# Patient Record
Sex: Male | Born: 1940
Health system: Southern US, Community
[De-identification: ages and names within clinical notes are randomized; demographics above are authoritative.]

## PROBLEM LIST (undated history)

## (undated) DIAGNOSIS — I4892 Unspecified atrial flutter: Secondary | ICD-10-CM

## (undated) DIAGNOSIS — I10 Essential (primary) hypertension: Secondary | ICD-10-CM

## (undated) SURGERY — BRONCHOSCOPY, WITH BIOPSY
Anesthesia: General | Laterality: Bilateral

---
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
## 2008-01-14 ENCOUNTER — Ambulatory Visit: Payer: Self-pay | Admitting: Gastroenterology

## 2008-03-17 ENCOUNTER — Ambulatory Visit: Payer: Self-pay | Admitting: Gastroenterology

## 2010-09-26 ENCOUNTER — Ambulatory Visit: Payer: Self-pay | Admitting: Gastroenterology

## 2010-09-28 LAB — PATHOLOGY REPORT

## 2012-11-26 ENCOUNTER — Ambulatory Visit: Payer: Self-pay | Admitting: Ophthalmology

## 2012-12-03 ENCOUNTER — Ambulatory Visit: Payer: Self-pay | Admitting: Ophthalmology

## 2014-09-11 NOTE — Op Note (Signed)
PATIENT NAME:  Corey Carr, Corey Carr MR#:  [id number] DATE OF BIRTH:  [birth year]  DATE OF PROCEDURE:  12/03/2012  PREOPERATIVE DIAGNOSIS: Visually significant cataract of the right eye.   POSTOPERATIVE DIAGNOSIS: Visually significant cataract of the right eye.   OPERATIVE PROCEDURE: Cataract extraction by phacoemulsification with implant of intraocular lens to right eye.   SURGEON: Birder Robson, MD.   ANESTHESIA:  1. Managed anesthesia care.  2. Topical tetracaine drops followed by 2% Xylocaine jelly applied in the preoperative holding area.   COMPLICATIONS: None.   TECHNIQUE:  Stop and chop.   DESCRIPTION OF PROCEDURE: The patient was examined and consented in the preoperative holding area where the aforementioned topical anesthesia was applied to the right eye and then brought back to the operating room where the right eye was prepped and draped in the usual sterile ophthalmic fashion and a lid speculum was placed. A paracentesis was created with the side port blade and the anterior chamber was filled with viscoelastic. A near clear corneal incision was performed with the steel keratome. A continuous curvilinear capsulorrhexis was performed with a cystotome followed by the capsulorrhexis forceps. Hydrodissection and hydrodelineation were carried out with BSS on a blunt cannula. The lens was removed in a stop and chop technique and the remaining cortical material was removed with the irrigation-aspiration handpiece. The capsular bag was inflated with viscoelastic and the Technis ZCBOO 21.5-diopter lens, serial number [id number], was placed in the capsular bag without complication. The remaining viscoelastic was removed from the eye with the irrigation-aspiration handpiece. The wounds were hydrated. The anterior chamber was flushed with Miostat and the eye was inflated to physiologic pressure. 0.1 mL of cefuroxime concentration 10 mg/mL was placed in the anterior chamber. The wounds were found to be  water tight. The eye was dressed with Vigamox. The patient was given protective glasses to wear throughout the day and a shield with which to sleep tonight. The patient was also given drops with which to begin a drop regimen today and will follow-up with me in one day.    ____________________________ Livingston Diones. Landyn Lorincz, MD wlp:cb D: 12/03/2012 16:54:44 ET T: 12/03/2012 20:47:15 ET JOB#: [id number]  cc: Retta Pitcher L. Fannie Alomar, MD, <Dictator> Livingston Diones Mayreli Alden MD ELECTRONICALLY SIGNED 12/06/2012 8:53

## 2016-02-23 DIAGNOSIS — N401 Enlarged prostate with lower urinary tract symptoms: Secondary | ICD-10-CM | POA: Insufficient documentation

## 2016-02-23 DIAGNOSIS — E785 Hyperlipidemia, unspecified: Secondary | ICD-10-CM | POA: Insufficient documentation

## 2016-02-23 DIAGNOSIS — K219 Gastro-esophageal reflux disease without esophagitis: Secondary | ICD-10-CM | POA: Insufficient documentation

## 2016-02-23 DIAGNOSIS — I10 Essential (primary) hypertension: Secondary | ICD-10-CM | POA: Diagnosis present

## 2016-08-25 ENCOUNTER — Encounter: Admission: RE | Payer: Self-pay | Source: Ambulatory Visit

## 2016-08-25 ENCOUNTER — Ambulatory Visit: Admission: RE | Admit: 2016-08-25 | Payer: Medicare Other | Source: Ambulatory Visit | Admitting: Gastroenterology

## 2016-08-25 SURGERY — COLONOSCOPY WITH PROPOFOL
Anesthesia: General

## 2016-11-14 ENCOUNTER — Encounter: Payer: Self-pay | Admitting: Urology

## 2016-11-14 ENCOUNTER — Ambulatory Visit (INDEPENDENT_AMBULATORY_CARE_PROVIDER_SITE_OTHER): Payer: Medicare Other | Admitting: Urology

## 2016-11-14 VITALS — BP 189/93 | HR 66 | Ht 68.0 in | Wt 153.3 lb

## 2016-11-14 DIAGNOSIS — R972 Elevated prostate specific antigen [PSA]: Secondary | ICD-10-CM

## 2016-11-14 NOTE — Progress Notes (Signed)
   11/14/2016 3:12 PM   Corey Carr May 10, 1941 [id number]  Referring provider: [name], MD [phone], [place] [address]  No chief complaint on file.   HPI:  Consultation for elevated PSA. Patient's October 2017 PSA was 10.24. He doesn't recall other PSA testing or a biopsy. No FH PCa. He has no h/o BPH. He typically voids with an adequate flow but slower stream. He has no frequency or urgency. He has trouble getting and maintaining erection.   He still works as a Mining engineer.      PMH: No past medical history on file.  Surgical History: No past surgical history on file.  Home Medications:  Allergies as of 11/14/2016   Not on File     Medication List    as of 11/14/2016  3:12 PM   You have not been prescribed any medications.     Allergies: Allergies not on file  Family History: No family history on file.  Social History:  has no tobacco, alcohol, and drug history on file.  ROS:                                        Physical Exam: There were no vitals taken for this visit.  Constitutional:  Alert and oriented, No acute distress. HEENT: [place] AT, moist mucus membranes.  Trachea midline, no masses. Cardiovascular: No clubbing, cyanosis, or edema. Respiratory: Normal respiratory effort, no increased work of breathing. GI: Abdomen is soft, nontender, nondistended, no abdominal masses GU: No CVA tenderness. Penis: uncircumcised, normal foreskin, no phimosis Testicle:descended bilaterally, no mass DRE: 40 grams, smooth, no hard area or nodule  Skin: No rashes, bruises or suspicious lesions. Lymph: No cervical or inguinal adenopathy. Neurologic: Grossly intact, no focal deficits, moving all 4 extremities. Psychiatric: Normal mood and affect.  Laboratory Data: No results found for: WBC, HGB, HCT, MCV, PLT  No results found for: CREATININE  No results found for: PSA  No results found for:  TESTOSTERONE  No results found for: HGBA1C  Urinalysis No results found for: COLORURINE, APPEARANCEUR, LABSPEC, PHURINE, GLUCOSEU, HGBUR, BILIRUBINUR, KETONESUR, PROTEINUR, UROBILINOGEN, NITRITE, LEUKOCYTESUR    Assessment & Plan:    PSA elevation -  Normal DRE today. I had a long discussion with the patient on the nature of elevated PSA - benign vs malignant causes. We discussed age specific levels and that PCa can be seen on a biopsy with very low PSA levels (<=2.5). We discussed the nature risks and benefits of continued surveillance, other lab tests, imaging as well as prostate biopsy. We discussed the management of prostate cancer might include active surveillance or treatment depending on biopsy findings. All questions answered. PSA was sent to determine of biopsy or f/u is in order.    There are no diagnoses linked to this encounter.  No Follow-up on file.  Festus Aloe, [facility] [address] [place], [place] [address] [phone]

## 2016-11-15 LAB — URINALYSIS, COMPLETE
BILIRUBIN UA: NEGATIVE
Glucose, UA: NEGATIVE
Ketones, UA: NEGATIVE
Leukocytes, UA: NEGATIVE
Nitrite, UA: NEGATIVE
Protein, UA: NEGATIVE
SPEC GRAV UA: 1.015 (ref 1.005–1.030)
Urobilinogen, Ur: 1 mg/dL (ref 0.2–1.0)
pH, UA: 7 (ref 5.0–7.5)

## 2016-11-15 LAB — PSA TOTAL (REFLEX TO FREE): Prostate Specific Ag, Serum: 10.1 ng/mL — ABNORMAL HIGH (ref 0.0–4.0)

## 2016-11-20 ENCOUNTER — Telehealth: Payer: Self-pay

## 2016-11-20 NOTE — Telephone Encounter (Signed)
Patient notified of results and was given biopsy instructions in detail (will also mail instructions to patient) patient states he is not on ASA for any medical reason just as preventative. Patient instructed to stop 7 days prior. No medical history in chart indicating medical clearance. Transferred to front to make biopsy apt

## 2016-11-20 NOTE — Telephone Encounter (Signed)
-----   Message from Festus Aloe, MD sent at 11/18/2016  5:40 PM EDT ----- Notify patient his PSA remains elevated at 10 -- give result. I recommend we proceed with a prostate biopsy as we discussed.  Please schedule him and over instructions.  Thank you.   ----- Message ----- From: Leia Alf, CMA Sent: 11/15/2016   8:08 AM To: Festus Aloe, MD    ----- Message ----- From: Lavone Neri Lab Results In Sent: 11/15/2016   5:40 AM To: Rowe Robert Clinical

## 2016-12-15 ENCOUNTER — Other Ambulatory Visit: Payer: Medicare Other | Admitting: Urology

## 2016-12-29 ENCOUNTER — Ambulatory Visit: Payer: Medicare Other

## 2017-01-19 ENCOUNTER — Other Ambulatory Visit: Payer: Medicare Other | Admitting: Urology

## 2017-02-02 ENCOUNTER — Ambulatory Visit: Payer: Medicare Other

## 2017-02-07 ENCOUNTER — Telehealth: Payer: Self-pay | Admitting: Urology

## 2017-02-07 NOTE — Telephone Encounter (Signed)
Pt canceled biopsy appt. For tomorrow 9/20.  He didn't have a ride.  Just F.Y.I.

## 2017-02-08 ENCOUNTER — Other Ambulatory Visit: Payer: Medicare Other

## 2017-02-21 ENCOUNTER — Ambulatory Visit: Payer: Medicare Other

## 2017-03-21 ENCOUNTER — Ambulatory Visit: Admission: RE | Admit: 2017-03-21 | Payer: Medicare Other | Source: Ambulatory Visit | Admitting: Ophthalmology

## 2017-03-21 ENCOUNTER — Encounter: Admission: RE | Payer: Self-pay | Source: Ambulatory Visit

## 2017-03-21 SURGERY — PHACOEMULSIFICATION, CATARACT, WITH IOL INSERTION
Anesthesia: Choice | Laterality: Left

## 2017-06-04 DIAGNOSIS — E785 Hyperlipidemia, unspecified: Secondary | ICD-10-CM | POA: Diagnosis not present

## 2017-06-11 DIAGNOSIS — K219 Gastro-esophageal reflux disease without esophagitis: Secondary | ICD-10-CM | POA: Diagnosis not present

## 2017-06-11 DIAGNOSIS — N401 Enlarged prostate with lower urinary tract symptoms: Secondary | ICD-10-CM | POA: Diagnosis not present

## 2017-06-11 DIAGNOSIS — I10 Essential (primary) hypertension: Secondary | ICD-10-CM | POA: Diagnosis not present

## 2017-06-11 DIAGNOSIS — R972 Elevated prostate specific antigen [PSA]: Secondary | ICD-10-CM | POA: Diagnosis not present

## 2017-06-11 DIAGNOSIS — Z23 Encounter for immunization: Secondary | ICD-10-CM | POA: Diagnosis not present

## 2017-06-11 DIAGNOSIS — E785 Hyperlipidemia, unspecified: Secondary | ICD-10-CM | POA: Diagnosis not present

## 2017-06-11 DIAGNOSIS — Z0000 Encounter for general adult medical examination without abnormal findings: Secondary | ICD-10-CM | POA: Diagnosis not present

## 2017-07-25 DIAGNOSIS — Z86010 Personal history of colonic polyps: Secondary | ICD-10-CM | POA: Diagnosis not present

## 2017-07-25 DIAGNOSIS — K219 Gastro-esophageal reflux disease without esophagitis: Secondary | ICD-10-CM | POA: Diagnosis not present

## 2017-07-25 DIAGNOSIS — K2270 Barrett's esophagus without dysplasia: Secondary | ICD-10-CM | POA: Diagnosis not present

## 2017-09-24 ENCOUNTER — Encounter: Admission: RE | Payer: Self-pay | Source: Ambulatory Visit

## 2017-09-24 ENCOUNTER — Ambulatory Visit: Admission: RE | Admit: 2017-09-24 | Payer: PPO | Source: Ambulatory Visit | Admitting: Gastroenterology

## 2017-09-24 SURGERY — COLONOSCOPY WITH PROPOFOL
Anesthesia: General

## 2019-01-07 ENCOUNTER — Other Ambulatory Visit: Payer: Self-pay | Admitting: Family Medicine

## 2019-01-07 DIAGNOSIS — R9389 Abnormal findings on diagnostic imaging of other specified body structures: Secondary | ICD-10-CM

## 2019-02-19 ENCOUNTER — Telehealth: Payer: Self-pay | Admitting: *Deleted

## 2019-02-19 NOTE — Telephone Encounter (Signed)
Left message for patient to notify them that it is time to schedule annual low dose lung cancer screening CT scan. Instructed patient to call back to verify information prior to the scan being scheduled.  

## 2019-02-20 ENCOUNTER — Telehealth: Payer: Self-pay | Admitting: *Deleted

## 2019-02-20 DIAGNOSIS — Z122 Encounter for screening for malignant neoplasm of respiratory organs: Secondary | ICD-10-CM

## 2019-02-20 DIAGNOSIS — Z87891 Personal history of nicotine dependence: Secondary | ICD-10-CM

## 2019-02-20 NOTE — Telephone Encounter (Signed)
Received referral for initial lung cancer screening scan. Contacted patient and obtained smoking history,(former, quit 2015, 30 pack year) as well as answering questions related to screening process. Patient denies signs of lung cancer such as weight loss or hemoptysis. Patient denies comorbidity that would prevent curative treatment if lung cancer were found. Patient is scheduled for shared decision making visit and CT scan on 03/06/19 at 130pm.

## 2019-03-05 ENCOUNTER — Encounter: Payer: Self-pay | Admitting: Nurse Practitioner

## 2019-03-06 ENCOUNTER — Ambulatory Visit
Admission: RE | Admit: 2019-03-06 | Discharge: 2019-03-06 | Disposition: A | Discharge status: Home / Self Care | Payer: Medicare Other | Source: Ambulatory Visit | Attending: Nurse Practitioner | Admitting: Nurse Practitioner

## 2019-03-06 ENCOUNTER — Other Ambulatory Visit: Payer: Self-pay

## 2019-03-06 ENCOUNTER — Inpatient Hospital Stay: Payer: Medicare Other | Attending: Nurse Practitioner | Admitting: Nurse Practitioner

## 2019-03-06 DIAGNOSIS — Z122 Encounter for screening for malignant neoplasm of respiratory organs: Secondary | ICD-10-CM | POA: Insufficient documentation

## 2019-03-06 DIAGNOSIS — R918 Other nonspecific abnormal finding of lung field: Secondary | ICD-10-CM | POA: Insufficient documentation

## 2019-03-06 DIAGNOSIS — I700 Atherosclerosis of aorta: Secondary | ICD-10-CM | POA: Insufficient documentation

## 2019-03-06 DIAGNOSIS — Z87891 Personal history of nicotine dependence: Secondary | ICD-10-CM

## 2019-03-06 DIAGNOSIS — K861 Other chronic pancreatitis: Secondary | ICD-10-CM | POA: Insufficient documentation

## 2019-03-06 DIAGNOSIS — J439 Emphysema, unspecified: Secondary | ICD-10-CM | POA: Insufficient documentation

## 2019-03-06 NOTE — Progress Notes (Signed)
Virtual Visit via Video Enabled Telemedicine Note   I connected with Corey Carr on 03/06/19 at 1:30 PM EST by video enabled telemedicine visit and verified that I am speaking with the correct person using two identifiers.   I discussed the limitations, risks, security and privacy concerns of performing an evaluation and management service by telemedicine and the availability of in-person appointments. I also discussed with the patient that there may be a patient responsible charge related to this service. The patient expressed understanding and agreed to proceed.   Other persons participating in the visit and their role in the encounter: Burgess Estelle, RN- checking in patient & navigation  Patient's location: [place]  Provider's location: Clinic  Chief Complaint: Low Dose CT Screening  Patient agreed to evaluation by telemedicine to discuss shared decision making for consideration of low dose CT lung cancer screening.    In accordance with CMS guidelines, patient has met eligibility criteria including age, absence of signs or symptoms of lung cancer.  Social History   Tobacco Use  . Smoking status: Former Smoker    Packs/day: 0.50    Years: 60.00    Pack years: 30.00    Types: Cigarettes    Quit date: 2015    Years since quitting: 5.7  . Smokeless tobacco: Never Used  Substance Use Topics  . Alcohol use: No     A shared decision-making session was conducted prior to the performance of CT scan. This includes one or more decision aids, includes benefits and harms of screening, follow-up diagnostic testing, over-diagnosis, false positive rate, and total radiation exposure.   Counseling on the importance of adherence to annual lung cancer LDCT screening, impact of co-morbidities, and ability or willingness to undergo diagnosis and treatment is imperative for compliance of the program.   Counseling on the importance of continued smoking cessation for former smokers; the  importance of smoking cessation for current smokers, and information about tobacco cessation interventions have been given to patient including [facility] and [address] [employer] [place] programs.   Written order for lung cancer screening with LDCT has been given to the patient and any and all questions have been answered to the best of my abilities.    Yearly follow up will be coordinated by Burgess Estelle, Thoracic Navigator.  I discussed the assessment and treatment plan with the patient. The patient was provided an opportunity to ask questions and all were answered. The patient agreed with the plan and demonstrated an understanding of the instructions.   The patient was advised to call back or seek an in-person evaluation if the symptoms worsen or if the condition fails to improve as anticipated.   I provided 15 minutes of face-to-face video visit time during this encounter, and > 50% was spent counseling as documented under my assessment & plan.   Beckey Rutter, DNP, AGNP-C [place] [facility] [phone] (work cell) [phone] (office)

## 2019-03-10 ENCOUNTER — Telehealth: Payer: Self-pay | Admitting: *Deleted

## 2019-03-10 NOTE — Telephone Encounter (Signed)
Notified patient of LDCT lung cancer screening program results with recommendation for pulmonary or thoracic surgery consultation. Also notified of incidental findings noted below and is encouraged to discuss further with PCP who will receive a copy of this note and/or the CT report. Patient verbalizes understanding.   Discussed results of scan with Corey Carr at PCP office as well as notifying [facility] pulmonologist that patient will likely be referred for evaluation. Patient is aware that he needs further evaluation by pulmonologist.   IMPRESSION: 1. Lung-RADS 4B, suspicious. Additional imaging evaluation or consultation with Pulmonology or Thoracic Surgery recommended. Multiple pulmonary abnormalities, the most significant of which is soft tissue density throughout the entirety of the left lower lobe, in the setting of moderate left hemidiaphragm elevation and diffuse pulmonary scarring and fibrosis. Radiographs including of 12/13/2018 describe similar findings, but without prior CTs for direct comparison, this remains indeterminate. Potential clinical strategies include comparison with prior CTs (if available). If not, bronchoscopy with attention to the left lower lobe bronchus and PET suggested. 2. Aortic atherosclerosis (ICD10-I70.0) and emphysema (ICD10-J43.9). 3. Chronic calcific pancreatitis.

## 2019-03-12 ENCOUNTER — Ambulatory Visit: Payer: Medicare Other | Admitting: Urology

## 2019-03-13 ENCOUNTER — Encounter: Payer: Self-pay | Admitting: Urology

## 2019-04-03 ENCOUNTER — Other Ambulatory Visit: Payer: Self-pay | Admitting: Pulmonary Disease

## 2019-04-03 DIAGNOSIS — J449 Chronic obstructive pulmonary disease, unspecified: Secondary | ICD-10-CM

## 2019-04-16 ENCOUNTER — Ambulatory Visit: Payer: Medicare Other | Admitting: Urology

## 2019-04-16 ENCOUNTER — Other Ambulatory Visit: Payer: Self-pay

## 2019-04-16 ENCOUNTER — Encounter: Payer: Self-pay | Admitting: Urology

## 2019-04-16 VITALS — BP 115/71 | HR 91 | Ht 68.0 in | Wt 117.0 lb

## 2019-04-16 DIAGNOSIS — R972 Elevated prostate specific antigen [PSA]: Secondary | ICD-10-CM

## 2019-04-16 DIAGNOSIS — N5201 Erectile dysfunction due to arterial insufficiency: Secondary | ICD-10-CM | POA: Diagnosis not present

## 2019-04-16 NOTE — Patient Instructions (Signed)
Prostate Cancer Screening  The prostate is a walnut-sized gland that is located below the bladder and in front of the rectum in males. The function of the prostate (prostate gland) is to add fluid to semen during ejaculation. Prostate cancer is the second most common type of cancer in men. A screening test for cancer is a test that is done before cancer symptoms start. Screening can help to identify cancer at an early stage, when the cancer can be treated more easily. The recommended prostate cancer screening test is a blood test called the prostate-specific antigen (PSA) test. PSA is a protein that is made in the prostate. As you age, your prostate naturally produces more PSA. Abnormally high PSA levels may be caused by:  Prostate cancer.  An enlarged prostate that is not caused by cancer (benign prostatic hyperplasia, BPH). This condition is very common in older men.  A prostate gland infection (prostatitis).  Medicines to assist with hair growth, such as finasteride. Depending on the PSA results, you may need more tests, such as:  A physical exam to check the size of your prostate gland.  Blood and imaging tests.  A procedure to remove tissue samples from your prostate gland for testing (biopsy). Who should have screening? Screening recommendations vary based on age.  If you are younger than age 22, screening is not recommended.  If you are age 28-54 and you have no risk factors, screening is not recommended.  If you are younger than age 43, ask your health care provider if you need screening if you have one of these risk factors: ? Being of African-American descent. ? Having a family history of prostate cancer.  If you are age 62-69, talk with your health care provider about your need for screening and how often screening should be done.  If you are older than age 58, screening is not recommended. This is because the risks that screening can cause are greater than the benefits  that it may provide (risks outweigh the benefits). If you are at high risk for prostate cancer, your health care provider may recommend that you have screenings more often or start screening at a younger age. You may be at high risk if you:  Are older than age 3.  Are African-American.  Have a father, brother, or uncle who has been diagnosed with prostate cancer. The risk may be higher if your family member's cancer occurred at an early age. What are the benefits of screening? There is a small chance that screening may lower your risk of dying from prostate cancer. The chance is small because prostate cancer is typically a slow-growing cancer, and most men with prostate cancer die from a different cause. What are the risks of screening? The main risk of prostate cancer screening is diagnosing and treating prostate cancer that would never have caused any symptoms or problems (overdiagnosis and overtreatment). PSA screening cannot tell you if your PSA is high due to cancer or a different cause. A prostate biopsy is the only procedure to diagnose prostate cancer. Even the results of a biopsy may not tell you if your cancer needs to be treated. Slow-growing prostate cancer may not need any treatment other than monitoring, so diagnosing and treating it may cause unnecessary stress or other side effects. A prostate biopsy may also cause:  Infection or fever.  A false negative. This is a result that shows that you do not have prostate cancer when you actually do have prostate cancer. Questions  to ask your health care provider  When should I start prostate cancer screening?  What is my risk for prostate cancer?  How often do I need screening?  What type of screening tests do I need?  How do I get my test results?  What do my results mean?  Do I need treatment? Contact a health care provider if:  You have difficulty urinating.  You have pain when you urinate or ejaculate.  You have  blood in your urine or semen.  You have pain in your back or in the area of your prostate.  You have trouble getting or maintaining an erection (erectile dysfunction, ED). Summary  Prostate cancer is a common type of cancer in men. The prostate (prostate gland) is located below the bladder and in front of the rectum. This gland adds fluid to semen during ejaculation.  Prostate cancer screening may identify cancer at an early stage, when the cancer can be treated more easily.  The prostate-specific antigen (PSA) test is the recommended screening test for prostate cancer.  Discuss the risks and benefits of prostate cancer screening with your health care provider. If you are age 105 or older, screening is likely to lead to more risks than benefits (risks outweigh the benefits). This information is not intended to replace advice given to you by your health care provider. Make sure you discuss any questions you have with your health care provider. Document Released: 02/16/2017 Document Revised: 04/20/2017 Document Reviewed: 02/16/2017 Elsevier Patient Education  2020 [employer].

## 2019-04-16 NOTE — Progress Notes (Signed)
04/16/2019 9:51 AM   Corey Carr 25-Feb-1941 [id number]  Referring provider: [name], MD [phone] [place],  [place] [address]  Chief Complaint  Patient presents with  . Elevated PSA    HPI:  F/u elevated PSA. Patient's October 2017 PSA was 10.24. He doesn't recall other PSA testing or a biopsy. No FH PCa. He has no h/o BPH. I repeated PSA and it was 10.1 in June 2018. Biopsy scheduled but patient canceled. PSA now 15.47 on 02/10/2019 with his PCP. Currently being evaluated for a Lungs rads 4 suspicious LLL consolidation. PET scan pending. 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    He typically voids with an adequate flow but slower stream. He has no frequency or urgency. He has trouble getting and maintaining erection. He has not tried Viagra recently. 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    PMH: No past medical history on file.  Surgical History: Past Surgical History:  Procedure Laterality Date  . KNEE SURGERY Right 1957    Home Medications:  Allergies as of 04/16/2019   Not on File     Medication List       Accurate as of April 16, 2019  9:51 AM. If you have any questions, ask your nurse or doctor.        Allergy Relief 10 MG tablet Generic drug: loratadine Take 20 mg by mouth daily.   aspirin 325 MG tablet Take 1 tablet by mouth daily.   DOCOSAHEXAENOIC ACID PO Take 1 capsule by mouth daily.   esomeprazole 20 MG capsule Commonly known as: NEXIUM Take 1 capsule by mouth daily.   Gas Relief Ultra Strength 180 MG Caps Generic drug: Simethicone Take by mouth.   TYLENOL 8 HOUR PO Take 1 tablet by mouth daily.   vitamin B-12 1000 MCG tablet Commonly known as: CYANOCOBALAMIN Take 1 tablet by mouth daily.       Allergies: Not on File  Family History: No family history on file.  Social History:  reports that he quit smoking about 5 years ago. His smoking use included  cigarettes. He has a 30.00 pack-year smoking history. He has never used smokeless tobacco. He reports that he does not drink alcohol or use drugs.  ROS: UROLOGY Frequent Urination?: No Hard to postpone urination?: No Burning/pain with urination?: No Get up at night to urinate?: Yes Leakage of urine?: No Urine stream starts and stops?: No Trouble starting stream?: No Do you have to strain to urinate?: No Blood in urine?: No Urinary tract infection?: No Sexually transmitted disease?: No Injury to kidneys or bladder?: No Painful intercourse?: No Weak stream?: Yes Erection problems?: No Penile pain?: No  Gastrointestinal Nausea?: No Vomiting?: No Indigestion/heartburn?: No Diarrhea?: No Constipation?: Yes  Constitutional Fever: No Night sweats?: No Weight loss?: No Fatigue?: No  Skin Skin rash/lesions?: No Itching?: No  Eyes Blurred vision?: No Double vision?: No  Ears/Nose/Throat Sore throat?: No Sinus problems?: No  Hematologic/Lymphatic Swollen glands?: No Easy bruising?: No  Cardiovascular Leg swelling?: No Chest pain?: No  Respiratory Cough?: No Shortness of breath?: No  Endocrine Excessive thirst?: No  Musculoskeletal Back pain?: No Joint pain?: No  Neurological Headaches?: No Dizziness?: No  Psychologic Depression?: No Anxiety?: No  Physical Exam: BP 115/71   Pulse 91   Ht 5\' 8"  (1.727 m)   Wt 53.1 kg   BMI 17.79 kg/m   Constitutional:  Alert and oriented, No  acute distress. HEENT: [place] AT, moist mucus membranes.  Trachea midline, no masses. Cardiovascular: No clubbing, cyanosis, or edema. Respiratory: Normal respiratory effort, slight increased work of breathing with short, quick breaths  GI: Abdomen is soft, nontender, nondistended, no abdominal masses Skin: No rashes, bruises or suspicious lesions. Neurologic: Grossly intact, no focal deficits, moving all 4 extremities. Psychiatric: Normal mood and affect. DRE: prostate about  45 gram, smooth, no induration. Landmarks preserved.   Laboratory Data: No results found for: WBC, HGB, HCT, MCV, PLT  No results found for: CREATININE  No results found for: PSA  No results found for: TESTOSTERONE  No results found for: HGBA1C  Urinalysis    Component Value Date/Time   APPEARANCEUR Clear 11/14/2016 1538   GLUCOSEU Negative 11/14/2016 1538   BILIRUBINUR Negative 11/14/2016 1538   PROTEINUR Negative 11/14/2016 1538   NITRITE Negative 11/14/2016 1538   LEUKOCYTESUR Negative 11/14/2016 1538    No results found for: LABMICR, [place], RBCUA, LABEPIT, MUCUS, BACTERIA  Pertinent Imaging:  No results found for this or any previous visit. No results found for this or any previous visit. No results found for this or any previous visit. No results found for this or any previous visit. No results found for this or any previous visit. No results found for this or any previous visit. No results found for this or any previous visit. No results found for this or any previous visit.  Assessment & Plan:    PSA - discussed possibility of PCa but with normal DRE and PSA 15, still likely to be localized. Given the work-up for the lung, I am going to hold off on biopsy and have him return with another PSA to review PET and PSA.   ED - discussed pde5i and he will consider.   No follow-ups on file.  Festus Aloe, MD  [facility] [address] [place], [place] [address] [phone]

## 2019-04-21 ENCOUNTER — Encounter: Admission: RE | Admit: 2019-04-21 | Payer: Medicare Other | Source: Ambulatory Visit

## 2019-04-28 ENCOUNTER — Encounter: Admission: RE | Admit: 2019-04-28 | Payer: Medicare Other | Source: Ambulatory Visit

## 2019-04-30 ENCOUNTER — Other Ambulatory Visit: Payer: Self-pay | Admitting: Pulmonary Disease

## 2019-04-30 ENCOUNTER — Other Ambulatory Visit
Admission: RE | Admit: 2019-04-30 | Discharge: 2019-04-30 | Disposition: A | Discharge status: Home / Self Care | Payer: Medicare Other | Source: Ambulatory Visit | Attending: Pulmonary Disease | Admitting: Pulmonary Disease

## 2019-04-30 ENCOUNTER — Other Ambulatory Visit: Payer: Self-pay

## 2019-04-30 DIAGNOSIS — Z01812 Encounter for preprocedural laboratory examination: Secondary | ICD-10-CM | POA: Diagnosis present

## 2019-04-30 DIAGNOSIS — Z20828 Contact with and (suspected) exposure to other viral communicable diseases: Secondary | ICD-10-CM | POA: Insufficient documentation

## 2019-04-30 DIAGNOSIS — J90 Pleural effusion, not elsewhere classified: Secondary | ICD-10-CM

## 2019-05-01 LAB — SARS CORONAVIRUS 2 (TAT 6-24 HRS): SARS Coronavirus 2: NEGATIVE

## 2019-05-02 ENCOUNTER — Other Ambulatory Visit: Payer: Self-pay

## 2019-05-02 ENCOUNTER — Ambulatory Visit
Admission: RE | Admit: 2019-05-02 | Discharge: 2019-05-02 | Disposition: A | Discharge status: Home / Self Care | Payer: Medicare Other | Source: Ambulatory Visit | Attending: Pulmonary Disease | Admitting: Pulmonary Disease

## 2019-05-02 ENCOUNTER — Other Ambulatory Visit: Payer: Self-pay | Admitting: Pulmonary Disease

## 2019-05-02 DIAGNOSIS — J90 Pleural effusion, not elsewhere classified: Secondary | ICD-10-CM

## 2019-05-08 ENCOUNTER — Inpatient Hospital Stay
Admission: EM | Admit: 2019-05-08 | Discharge: 2019-05-12 | DRG: 177 | Disposition: A | Discharge status: Home Health | Payer: Medicare Other | Attending: Internal Medicine | Admitting: Internal Medicine

## 2019-05-08 ENCOUNTER — Encounter: Payer: Self-pay | Admitting: Emergency Medicine

## 2019-05-08 ENCOUNTER — Emergency Department: Payer: Medicare Other

## 2019-05-08 ENCOUNTER — Other Ambulatory Visit: Payer: Self-pay

## 2019-05-08 DIAGNOSIS — J440 Chronic obstructive pulmonary disease with acute lower respiratory infection: Secondary | ICD-10-CM | POA: Diagnosis present

## 2019-05-08 DIAGNOSIS — K219 Gastro-esophageal reflux disease without esophagitis: Secondary | ICD-10-CM | POA: Diagnosis present

## 2019-05-08 DIAGNOSIS — R918 Other nonspecific abnormal finding of lung field: Secondary | ICD-10-CM | POA: Diagnosis not present

## 2019-05-08 DIAGNOSIS — E538 Deficiency of other specified B group vitamins: Secondary | ICD-10-CM | POA: Diagnosis present

## 2019-05-08 DIAGNOSIS — R634 Abnormal weight loss: Secondary | ICD-10-CM | POA: Diagnosis present

## 2019-05-08 DIAGNOSIS — R627 Adult failure to thrive: Secondary | ICD-10-CM | POA: Diagnosis present

## 2019-05-08 DIAGNOSIS — Z7951 Long term (current) use of inhaled steroids: Secondary | ICD-10-CM | POA: Diagnosis not present

## 2019-05-08 DIAGNOSIS — E875 Hyperkalemia: Secondary | ICD-10-CM | POA: Diagnosis present

## 2019-05-08 DIAGNOSIS — J45909 Unspecified asthma, uncomplicated: Secondary | ICD-10-CM | POA: Diagnosis present

## 2019-05-08 DIAGNOSIS — I10 Essential (primary) hypertension: Secondary | ICD-10-CM | POA: Diagnosis present

## 2019-05-08 DIAGNOSIS — J90 Pleural effusion, not elsewhere classified: Secondary | ICD-10-CM | POA: Diagnosis not present

## 2019-05-08 DIAGNOSIS — E785 Hyperlipidemia, unspecified: Secondary | ICD-10-CM | POA: Diagnosis present

## 2019-05-08 DIAGNOSIS — Z7982 Long term (current) use of aspirin: Secondary | ICD-10-CM

## 2019-05-08 DIAGNOSIS — J302 Other seasonal allergic rhinitis: Secondary | ICD-10-CM

## 2019-05-08 DIAGNOSIS — Z7189 Other specified counseling: Secondary | ICD-10-CM | POA: Diagnosis not present

## 2019-05-08 DIAGNOSIS — D519 Vitamin B12 deficiency anemia, unspecified: Secondary | ICD-10-CM | POA: Diagnosis not present

## 2019-05-08 DIAGNOSIS — J1289 Other viral pneumonia: Secondary | ICD-10-CM | POA: Diagnosis present

## 2019-05-08 DIAGNOSIS — Z79899 Other long term (current) drug therapy: Secondary | ICD-10-CM

## 2019-05-08 DIAGNOSIS — Z833 Family history of diabetes mellitus: Secondary | ICD-10-CM | POA: Diagnosis not present

## 2019-05-08 DIAGNOSIS — E860 Dehydration: Secondary | ICD-10-CM | POA: Diagnosis present

## 2019-05-08 DIAGNOSIS — U071 COVID-19: Secondary | ICD-10-CM

## 2019-05-08 DIAGNOSIS — N400 Enlarged prostate without lower urinary tract symptoms: Secondary | ICD-10-CM | POA: Diagnosis present

## 2019-05-08 DIAGNOSIS — Z681 Body mass index (BMI) 19 or less, adult: Secondary | ICD-10-CM | POA: Diagnosis not present

## 2019-05-08 DIAGNOSIS — J988 Other specified respiratory disorders: Secondary | ICD-10-CM | POA: Diagnosis present

## 2019-05-08 DIAGNOSIS — Z87891 Personal history of nicotine dependence: Secondary | ICD-10-CM | POA: Diagnosis not present

## 2019-05-08 DIAGNOSIS — C3492 Malignant neoplasm of unspecified part of left bronchus or lung: Secondary | ICD-10-CM | POA: Diagnosis present

## 2019-05-08 DIAGNOSIS — Z515 Encounter for palliative care: Secondary | ICD-10-CM | POA: Diagnosis not present

## 2019-05-08 DIAGNOSIS — C3490 Malignant neoplasm of unspecified part of unspecified bronchus or lung: Secondary | ICD-10-CM | POA: Diagnosis present

## 2019-05-08 DIAGNOSIS — N179 Acute kidney failure, unspecified: Secondary | ICD-10-CM | POA: Diagnosis present

## 2019-05-08 DIAGNOSIS — R6251 Failure to thrive (child): Secondary | ICD-10-CM | POA: Diagnosis present

## 2019-05-08 DIAGNOSIS — N178 Other acute kidney failure: Secondary | ICD-10-CM | POA: Diagnosis not present

## 2019-05-08 LAB — MAGNESIUM: Magnesium: 2.4 mg/dL (ref 1.7–2.4)

## 2019-05-08 LAB — COMPREHENSIVE METABOLIC PANEL
ALT: 20 U/L (ref 0–44)
AST: 25 U/L (ref 15–41)
Albumin: 4.1 g/dL (ref 3.5–5.0)
Alkaline Phosphatase: 98 U/L (ref 38–126)
Anion gap: 17 — ABNORMAL HIGH (ref 5–15)
BUN: 87 mg/dL — ABNORMAL HIGH (ref 8–23)
CO2: 16 mmol/L — ABNORMAL LOW (ref 22–32)
Calcium: 12.3 mg/dL — ABNORMAL HIGH (ref 8.9–10.3)
Chloride: 102 mmol/L (ref 98–111)
Creatinine, Ser: 2.09 mg/dL — ABNORMAL HIGH (ref 0.61–1.24)
GFR calc Af Amer: 34 mL/min — ABNORMAL LOW (ref >60)
GFR calc non Af Amer: 29 mL/min — ABNORMAL LOW (ref >60)
Glucose, Bld: 118 mg/dL — ABNORMAL HIGH (ref 70–99)
Potassium: 5.3 mmol/L — ABNORMAL HIGH (ref 3.5–5.1)
Sodium: 135 mmol/L (ref 135–145)
Total Bilirubin: 0.7 mg/dL (ref 0.3–1.2)
Total Protein: 8.8 g/dL — ABNORMAL HIGH (ref 6.5–8.1)

## 2019-05-08 LAB — CBC WITH DIFFERENTIAL/PLATELET
Abs Immature Granulocytes: 0.07 10*3/uL (ref 0.00–0.07)
Basophils Absolute: 0.1 10*3/uL (ref 0.0–0.1)
Basophils Relative: 0 %
Eosinophils Absolute: 0.1 10*3/uL (ref 0.0–0.5)
Eosinophils Relative: 1 %
HCT: 39.9 % (ref 39.0–52.0)
Hemoglobin: 12.7 g/dL — ABNORMAL LOW (ref 13.0–17.0)
Immature Granulocytes: 1 %
Lymphocytes Relative: 12 %
Lymphs Abs: 1.8 10*3/uL (ref 0.7–4.0)
MCH: 26.2 pg (ref 26.0–34.0)
MCHC: 31.8 g/dL (ref 30.0–36.0)
MCV: 82.4 fL (ref 80.0–100.0)
Monocytes Absolute: 1 10*3/uL (ref 0.1–1.0)
Monocytes Relative: 7 %
Neutro Abs: 11.9 10*3/uL — ABNORMAL HIGH (ref 1.7–7.7)
Neutrophils Relative %: 79 %
Platelets: 517 10*3/uL — ABNORMAL HIGH (ref 150–400)
RBC: 4.84 MIL/uL (ref 4.22–5.81)
RDW: 15.7 % — ABNORMAL HIGH (ref 11.5–15.5)
WBC: 15 10*3/uL — ABNORMAL HIGH (ref 4.0–10.5)
nRBC: 0 % (ref 0.0–0.2)

## 2019-05-08 LAB — BLOOD GAS, ARTERIAL
Acid-base deficit: 7.1 mmol/L — ABNORMAL HIGH (ref 0.0–2.0)
Bicarbonate: 16.6 mmol/L — ABNORMAL LOW (ref 20.0–28.0)
FIO2: 0.21
O2 Saturation: 97.2 %
Patient temperature: 37
pCO2 arterial: 28 mmHg — ABNORMAL LOW (ref 32.0–48.0)
pH, Arterial: 7.38 (ref 7.350–7.450)
pO2, Arterial: 94 mmHg (ref 83.0–108.0)

## 2019-05-08 LAB — PHOSPHORUS: Phosphorus: 4.4 mg/dL (ref 2.5–4.6)

## 2019-05-08 LAB — COOXEMETRY PANEL
Carboxyhemoglobin: 2 % — ABNORMAL HIGH (ref 0.5–1.5)
Methemoglobin: 0.9 % (ref 0.0–1.5)
O2 Saturation: 97.2 %
Total oxygen content: 96.1 mL/dL

## 2019-05-08 LAB — TROPONIN I (HIGH SENSITIVITY): Troponin I (High Sensitivity): 19 ng/L — ABNORMAL HIGH (ref <18)

## 2019-05-08 MED ORDER — HEPARIN SODIUM (PORCINE) 5000 UNIT/ML IJ SOLN
5000.0000 [IU] | Freq: Three times a day (TID) | INTRAMUSCULAR | Status: DC
  Administered 2019-05-09 – 2019-05-12 (×10): 5000 [IU] via SUBCUTANEOUS
  Filled 2019-05-08 (×10): qty 1

## 2019-05-08 MED ORDER — FLUTICASONE-UMECLIDIN-VILANT 100-62.5-25 MCG/INH IN AEPB: 1.0000 | INHALATION_SPRAY | Freq: Every day | RESPIRATORY_TRACT | Status: DC

## 2019-05-08 MED ORDER — MAGNESIUM HYDROXIDE 400 MG/5ML PO SUSP: 30.0000 mL | Freq: Every day | ORAL | Status: DC | PRN

## 2019-05-08 MED ORDER — ASCORBIC ACID 500 MG PO TABS
250.0000 mg | ORAL_TABLET | Freq: Every day | ORAL | Status: DC
  Administered 2019-05-09: 250 mg via ORAL
  Filled 2019-05-08: qty 0.5

## 2019-05-08 MED ORDER — UMECLIDINIUM BROMIDE 62.5 MCG/INH IN AEPB
1.0000 | INHALATION_SPRAY | Freq: Every day | RESPIRATORY_TRACT | Status: DC
  Administered 2019-05-09 – 2019-05-12 (×4): 1 via RESPIRATORY_TRACT
  Filled 2019-05-08: qty 7

## 2019-05-08 MED ORDER — SODIUM CHLORIDE 0.9 % IV SOLN: INTRAVENOUS | Status: DC

## 2019-05-08 MED ORDER — OMEGA-3-ACID ETHYL ESTERS 1 G PO CAPS
1.0000 g | ORAL_CAPSULE | Freq: Every day | ORAL | Status: DC
  Administered 2019-05-09 – 2019-05-12 (×4): 1 g via ORAL
  Filled 2019-05-08 (×4): qty 1

## 2019-05-08 MED ORDER — ACETAMINOPHEN 325 MG PO TABS
650.0000 mg | ORAL_TABLET | Freq: Four times a day (QID) | ORAL | Status: DC | PRN
  Administered 2019-05-09 (×2): 650 mg via ORAL
  Filled 2019-05-08 (×3): qty 2

## 2019-05-08 MED ORDER — ACETAMINOPHEN 650 MG RE SUPP: 650.0000 mg | Freq: Four times a day (QID) | RECTAL | Status: DC | PRN

## 2019-05-08 MED ORDER — LORATADINE 10 MG PO TABS
10.0000 mg | ORAL_TABLET | Freq: Every day | ORAL | Status: DC
  Administered 2019-05-09 – 2019-05-12 (×4): 10 mg via ORAL
  Filled 2019-05-08 (×4): qty 1

## 2019-05-08 MED ORDER — VITAMIN B-12 1000 MCG PO TABS
1000.0000 ug | ORAL_TABLET | Freq: Every day | ORAL | Status: DC
  Administered 2019-05-09 – 2019-05-12 (×4): 1000 ug via ORAL
  Filled 2019-05-08 (×5): qty 1

## 2019-05-08 MED ORDER — VITAMIN D 25 MCG (1000 UNIT) PO TABS
1000.0000 [IU] | ORAL_TABLET | Freq: Every day | ORAL | Status: DC
  Administered 2019-05-09 – 2019-05-12 (×4): 1000 [IU] via ORAL
  Filled 2019-05-08 (×4): qty 1

## 2019-05-08 MED ORDER — SODIUM CHLORIDE 0.9 % IV BOLUS
1000.0000 mL | Freq: Once | INTRAVENOUS | Status: AC
  Administered 2019-05-08: 1000 mL via INTRAVENOUS

## 2019-05-08 MED ORDER — VITAMIN E 180 MG (400 UNIT) PO CAPS
400.0000 [IU] | ORAL_CAPSULE | Freq: Every day | ORAL | Status: DC
  Administered 2019-05-09 – 2019-05-12 (×4): 400 [IU] via ORAL
  Filled 2019-05-08 (×4): qty 1

## 2019-05-08 MED ORDER — TRAMADOL HCL 50 MG PO TABS: 50.0000 mg | ORAL_TABLET | Freq: Four times a day (QID) | ORAL | Status: DC | PRN

## 2019-05-08 MED ORDER — TRAZODONE HCL 50 MG PO TABS: 25.0000 mg | ORAL_TABLET | Freq: Every evening | ORAL | Status: DC | PRN

## 2019-05-08 MED ORDER — SODIUM CHLORIDE 0.9 % IV SOLN: Freq: Once | INTRAVENOUS | Status: DC

## 2019-05-08 MED ORDER — MEGESTROL ACETATE 20 MG PO TABS
40.0000 mg | ORAL_TABLET | Freq: Every day | ORAL | Status: DC
  Administered 2019-05-09: 40 mg via ORAL
  Filled 2019-05-08: qty 2

## 2019-05-08 MED ORDER — FLUTICASONE FUROATE-VILANTEROL 100-25 MCG/INH IN AEPB
1.0000 | INHALATION_SPRAY | Freq: Every day | RESPIRATORY_TRACT | Status: DC
  Administered 2019-05-09 – 2019-05-12 (×4): 1 via RESPIRATORY_TRACT
  Filled 2019-05-08: qty 28

## 2019-05-08 MED ORDER — MORPHINE SULFATE (PF) 2 MG/ML IV SOLN: 1.0000 mg | INTRAVENOUS | Status: DC | PRN

## 2019-05-08 MED ORDER — ENOXAPARIN SODIUM 40 MG/0.4ML ~~LOC~~ SOLN: 40.0000 mg | SUBCUTANEOUS | Status: DC

## 2019-05-08 NOTE — H&P (Signed)
[facility] [place] NAME: Corey Carr    MR#:  [id number]  DATE OF BIRTH:  Jan 19, 1941  DATE OF ADMISSION:  05/08/2019  PRIMARY CARE PHYSICIAN: [name], MD   REQUESTING/REFERRING PHYSICIAN: Derrell Lolling, MD CHIEF COMPLAINT:   Chief Complaint  Patient presents with  . Weight Loss  The patient was seen and examined on 05/08/2019  HISTORY OF PRESENT ILLNESS:  Corey Carr  is a 78 y.o. cachectic Caucasian male with a known history of hypertension, asthma, seasonal allergy and vitamin B12 deficiency, who presented to the emergency room with acute onset of weight loss.  Been pounds last week and today he weighed 104.28.  6 months ago he weighed 145 pounds and therefore he lost about close to 41 pounds in the last 6 months.  He denied any nausea or vomiting however has been having loose bowel movements over the last 3 to 4 weeks.  He admits to dyspnea and cough without wheezing.  He quit smoking cigarettes about 5 years ago.  He has been having dry mouth.  No dysuria, oliguria or hematuria or flank pain.  He lives with his wife who is on hemodialysis.  He has been recently taking Tylenol for back pain.  No night sweats or fever.  No headache or dizziness or blurred vision, paresthesias or focal muscle weakness.  Upon presentation to the emergency room, his respiratory rate was 24 otherwise vital signs were within normal.  ABG showed pH 7.38 with bicarbonate of 16.6, PCO2 28 and PO2 94 and O2 sat of 97.2% on room air.  His carboxyhemoglobin was 2 and methemoglobin was 0.9.  His CMP was remarkable for mild hyperkalemia of 5.3 and a BUN of 87 with a creatinine of 2.09 with anion gap of 17.  Troponin I was 19 and CBC showed leukocytosis of 15 with neutrophilia.  COVID-19 test is currently pending.  Two-view chest x-ray showed COPD, chronic volume loss on the left with chronic left lower lung opacity and possible small left pleural effusion.  The patient was given  1 L bolus of IV normal saline.  He will be admitted to a medically monitored bed for further evaluation and management.   PAST MEDICAL HISTORY:  Hypertension Osteoarthritis, Asthma/COPD Vitamin B12 deficiency Seasonal allergies  PAST SURGICAL HISTORY:   Past Surgical History:  Procedure Laterality Date  . KNEE SURGERY Right 1957    SOCIAL HISTORY:   Social History   Tobacco Use  . Smoking status: Former Smoker    Packs/day: 0.50    Years: 60.00    Pack years: 30.00    Types: Cigarettes    Quit date: 2015    Years since quitting: 5.9  . Smokeless tobacco: Never Used  Substance Use Topics  . Alcohol use: No    FAMILY HISTORY:  Positive for diabetes mellitus in his brother.  DRUG ALLERGIES:  Not on File  REVIEW OF SYSTEMS:   ROS As per history of present illness. All pertinent systems were reviewed above. Constitutional,  HEENT, cardiovascular, respiratory, GI, GU, musculoskeletal, neuro, psychiatric, endocrine,  integumentary and hematologic systems were reviewed and are otherwise  negative/unremarkable except for positive findings mentioned above in the HPI.   MEDICATIONS AT HOME:   Prior to Admission medications   Medication Sig Start Date End Date Taking? Authorizing Provider  aspirin 325 MG tablet Take 1 tablet by mouth daily.   Yes [provider]  azithromycin (ZITHROMAX) 500 MG tablet Take 500 mg  by mouth daily. 05/02/19  Yes [provider]  cholecalciferol (VITAMIN D3) 25 MCG (1000 UT) tablet Take 1,000 Units by mouth daily.   Yes [provider]  loratadine (ALLERGY RELIEF) 10 MG tablet Take 20 mg by mouth daily.   Yes [provider]  losartan-hydrochlorothiazide (HYZAAR) 100-25 MG tablet Take 1 tablet by mouth daily. 04/22/19  Yes [provider]  megestrol (MEGACE) 40 MG tablet Take 40 mg by mouth daily. 05/02/19  Yes [provider]  meloxicam (MOBIC) 15 MG tablet Take 15 mg by mouth daily as  needed. 04/22/19  Yes [provider]  omega-3 acid ethyl esters (LOVAZA) 1 g capsule Take 1 g by mouth daily.   Yes [provider]  traMADol (ULTRAM) 50 MG tablet Take 50 mg by mouth every 6 (six) hours as needed. 04/30/19  Yes [provider]  TRELEGY ELLIPTA 100-62.5-25 MCG/INH AEPB Take 1 puff by mouth daily. 05/01/19  Yes [provider]  vitamin B-12 (CYANOCOBALAMIN) 1000 MCG tablet Take 1 tablet by mouth daily.   Yes [provider]  vitamin C (ASCORBIC ACID) 250 MG tablet Take 250 mg by mouth daily.   Yes [provider]  vitamin E (VITAMIN E) 400 UNIT capsule Take 400 Units by mouth daily.   Yes [provider]      VITAL SIGNS:  Blood pressure 107/77, pulse (!) 104, temperature 97.8 F (36.6 C), temperature source Axillary, resp. rate 20, weight 47.4 kg, SpO2 98 %.  PHYSICAL EXAMINATION:  Physical Exam  GENERAL:  78 y.o.-year-old cachectic Caucasian male patient lying in the bed with mild respiratory distress with conversational dyspnea. EYES: Pupils equal, round, reactive to light and accommodation. No scleral icterus. Extraocular muscles intact.  HEENT: Head atraumatic, normocephalic. Oropharynx and nasopharynx clear.  NECK:  Supple, no jugular venous distention. No thyroid enlargement, no tenderness.  LUNGS: Diminished left basal breath sounds with associated crackles.  CARDIOVASCULAR: Regular rate and rhythm, S1, S2 normal. No murmurs, rubs, or gallops.  ABDOMEN: Soft, nondistended, nontender. Bowel sounds present. No organomegaly or mass.  EXTREMITIES: No pedal edema, cyanosis, or clubbing.  NEUROLOGIC: Cranial nerves II through XII are intact. Muscle strength 5/5 in all extremities. Sensation intact. Gait not checked.  PSYCHIATRIC: The patient is alert and oriented x 3.  Normal affect and good eye contact. SKIN: No obvious rash, lesion, or ulcer.   LABORATORY PANEL:   CBC Recent Labs  Lab 05/08/19 1833    WBC 15.0*  HGB 12.7*  HCT 39.9  PLT 517*   ------------------------------------------------------------------------------------------------------------------  Chemistries  Recent Labs  Lab 05/08/19 2024  NA 135  K 5.3*  CL 102  CO2 16*  GLUCOSE 118*  BUN 87*  CREATININE 2.09*  CALCIUM 12.3*  MG 2.4  AST 25  ALT 20  ALKPHOS 98  BILITOT 0.7   ------------------------------------------------------------------------------------------------------------------  Cardiac Enzymes No results for input(s): TROPONINI in the last 168 hours. ------------------------------------------------------------------------------------------------------------------  RADIOLOGY:  DG Chest 2 View  Result Date: 05/08/2019 CLINICAL DATA:  Weakness EXAM: CHEST - 2 VIEW COMPARISON:  CT 03/06/2019 FINDINGS: There is hyperinflation of the lungs compatible with COPD. Continued opacification in the left lower lung is stable since prior CT. No confluent opacity on the right. Possible small left effusion. Volume loss on the left with shift of mediastinal structures to the left. Heart is normal size. No acute bony abnormality. IMPRESSION: COPD. Chronic volume loss on the left with chronic left lower lung opacity and possible small left effusion.  Electronically Signed   By: Rolm Baptise M.D.   On: 05/08/2019 18:45      IMPRESSION AND PLAN:   1.  Failure to thrive with associated significant abnormal recent weight loss and acute kidney injury with associated dehydration likely associated with recent diarrhea.  The patient will be admitted to a medical monitored bed.  He will be placed on hydration with IV normal saline and will follow his BMP.  We will obtain stool studies with improvement of his creatinine and abdominal and pelvic CT scan can be obtained.  Hyzaar and Mobic will be held off.  2.  Left lower lung opacity with associated left pleural effusion concerning for pneumonia with parapneumonic effusion  specially with associated dyspnea and cough.  I will place the patient on IV Rocephin and Zithromax and obtain sputum Gram stain as well as culture and sensitivity in addition to 2 blood cultures.  A chest CT can later be obtained with contrast for further assessment with improvement of his creatinine.  I will hold off Megace for now pending chest CTA with improvement of creatinine.  3.  COPD without acute exacerbation.  We will continue his Trelegy.  He will be placed on as needed duo nebs.  We will continue his Incruse Ellipta.  4.  DVT prophylaxis.  Subcutaneous Lovenox    All the records are reviewed and case discussed with ED provider. The plan of care was discussed in details with the patient (and family). I answered all questions. The patient agreed to proceed with the above mentioned plan. Further management will depend upon hospital course.   CODE STATUS: Full code   Christel Mormon M.D on 05/08/2019 at 11:25 PM  [facility]   From 7 PM-7 AM, contact night-coverage [email or web address]  CC: Primary care physician; [name], MD   Note: This dictation was prepared with Dragon dictation along with smaller phrase technology. Any transcriptional errors that result from this process are unintentional.

## 2019-05-08 NOTE — ED Provider Notes (Signed)
[facility] Emergency Department Provider Note  ____________________________________________   First MD Initiated Contact with Patient 05/08/19 1758     (approximate)  I have reviewed the triage vital signs and the nursing notes.  History  Chief Complaint Weight Loss    HPI Corey Carr is a 78 y.o. male with hx of HTN, HLD, GERD, BPH who presents to the ED for FTT, generalized weakness, significant weight loss over the last several months. Currently being worked up by pulmonology for a pleural effusion, and plans for endoscopy with GI for evaluation of his weight loss to examine for potential underlying malignancy.   Today his weakness was so severe, he felt fatigued and winded with walking short distances, therefore called EMS. States he could not even walk 10 feet. EMS notes some concern for possible carbon monoxide issues due to smell of gas in the home.   Patient is not eating or taking any significant PO. He was started on some appetite stimulants with no improvement.    Past Medical Hx No past medical history on file.  Problem List There are no problems to display for this patient.   Past Surgical Hx Past Surgical History:  Procedure Laterality Date  . KNEE SURGERY Right 1957    Medications Prior to Admission medications   Medication Sig Start Date End Date Taking? Authorizing Provider  aspirin 325 MG tablet Take 1 tablet by mouth daily.   Yes [provider]  azithromycin (ZITHROMAX) 500 MG tablet Take 500 mg by mouth daily. 05/02/19  Yes [provider]  cholecalciferol (VITAMIN D3) 25 MCG (1000 UT) tablet Take 1,000 Units by mouth daily.   Yes [provider]  loratadine (ALLERGY RELIEF) 10 MG tablet Take 20 mg by mouth daily.   Yes [provider]  losartan-hydrochlorothiazide (HYZAAR) 100-25 MG tablet Take 1 tablet by mouth daily. 04/22/19  Yes [provider]  megestrol (MEGACE) 40 MG  tablet Take 40 mg by mouth daily. 05/02/19  Yes [provider]  meloxicam (MOBIC) 15 MG tablet Take 15 mg by mouth daily as needed. 04/22/19  Yes [provider]  omega-3 acid ethyl esters (LOVAZA) 1 g capsule Take 1 g by mouth daily.   Yes [provider]  traMADol (ULTRAM) 50 MG tablet Take 50 mg by mouth every 6 (six) hours as needed. 04/30/19  Yes [provider]  TRELEGY ELLIPTA 100-62.5-25 MCG/INH AEPB Take 1 puff by mouth daily. 05/01/19  Yes [provider]  vitamin B-12 (CYANOCOBALAMIN) 1000 MCG tablet Take 1 tablet by mouth daily.   Yes [provider]  vitamin C (ASCORBIC ACID) 250 MG tablet Take 250 mg by mouth daily.   Yes [provider]  vitamin E (VITAMIN E) 400 UNIT capsule Take 400 Units by mouth daily.   Yes [provider]    Allergies Patient has no allergy information on record.  Family Hx No family history on file.  Social Hx Social History   Tobacco Use  . Smoking status: Former Smoker    Packs/day: 0.50    Years: 60.00    Pack years: 30.00    Types: Cigarettes    Quit date: 2015    Years since quitting: 5.9  . Smokeless tobacco: Never Used  Substance Use Topics  . Alcohol use: No  . Drug use: No     Review of Systems  Constitutional: Negative for fever, chills. + weight loss, generalized weakness, FTT Eyes: Negative for visual changes.  ENT: Negative for sore throat. Cardiovascular: Negative for chest pain. Respiratory: Negative for shortness of breath. Gastrointestinal: Negative for nausea, vomiting.  Genitourinary: Negative for dysuria. Musculoskeletal: Negative for leg swelling. Skin: Negative for rash. Neurological: Negative for for headaches.   Physical Exam  Vital Signs: ED Triage Vitals  Enc Vitals Group     BP 05/08/19 1753 122/79     Pulse Rate 05/08/19 1753 90     Resp 05/08/19 1753 (!) 24     Temp 05/08/19 1753 97.8 F (36.6 C)     Temp Source 05/08/19  1753 Axillary     SpO2 05/08/19 1756 94 %     Weight 05/08/19 1756 104 lb 8 oz (47.4 kg)     Height --      Head Circumference --      Peak Flow --      Pain Score 05/08/19 1756 0     Pain Loc --      Pain Edu? --      Excl. in [place]? --     Constitutional: Alert and oriented. Thin and frail appearing. Head: Normocephalic. Atraumatic. Temporal wasting. Eyes: Conjunctivae clear. Sclera anicteric. Nose: No congestion. No rhinorrhea. Mouth/Throat: Wearing mask.  Neck: No stridor.   Cardiovascular: Normal rate, regular rhythm. Extremities well perfused. Respiratory: Normal respiratory effort.  Lungs CTAB. Gastrointestinal: Soft. Non-tender. Non-distended.  Musculoskeletal: No lower extremity edema. No deformities. Neurologic:  Normal speech and language. No gross focal neurologic deficits are appreciated.  Skin: Skin is warm, dry and intact. No rash noted. Psychiatric: Mood and affect are appropriate for situation.  EKG  Personally reviewed.   Rate: 91 Rhythm: sinus Axis: leftward Intervals: within normal limits TWI in aVL No STEMI    Radiology  CXR: IMPRESSION:  COPD.   Chronic volume loss on the left with chronic left lower lung opacity  and possible small left effusion.    Procedures  Procedure(s) performed (including critical care):  Procedures   Initial Impression / Assessment and Plan / ED Course  78 y.o. male who presents to the ED for weight loss, generalized weakness, FTT  Ddx: underlying malignancy, infection, electrolyte abnormality, dehydration  Will obtain labs, imaging, EKG, CO levels given EMS concern  Work up reveal AKI, creatinine 2.09 from normal previously in care everywhere. No significant anemia. Carboxyhemoglobin minimally elevated. Mild hyperK to 5.3. Hypercalcemia 12.3. will give IVF, plan to admit for further hydration, correction of electrolytes.    Final Clinical Impression(s) / ED Diagnosis  Final diagnoses:  AKI (acute kidney  injury) ([place])  Dehydration  Weight loss  Failure to thrive in adult       Note:  This document was prepared using Dragon voice recognition software and may include unintentional dictation errors.   Lilia Pro., MD 05/09/19 1250

## 2019-05-08 NOTE — ED Notes (Signed)
Attempted iv x 2 with no success.

## 2019-05-08 NOTE — ED Triage Notes (Signed)
Pt states weight loss over months. Has talked to his pcp regarding this and is on something to increase his appetite, given tramadol and an inhaler as well for feeling out of breath (points at his abd and says "I feel out of breath right here".  Pt is emaciated. Lives at home with his wife. Per ems there was a "carbon monoxide problem" at the house as the first ems stated they smelled gas in the house.

## 2019-05-08 NOTE — ED Notes (Signed)
Pt has a radiologic result suspicious for lung cancer but pt states "they didn't find anything". Per pt hasn't had pet scan or bronch wash to make sure.

## 2019-05-09 ENCOUNTER — Other Ambulatory Visit: Payer: Self-pay

## 2019-05-09 ENCOUNTER — Inpatient Hospital Stay: Payer: Medicare Other

## 2019-05-09 ENCOUNTER — Encounter: Payer: Self-pay | Admitting: Family Medicine

## 2019-05-09 DIAGNOSIS — E538 Deficiency of other specified B group vitamins: Secondary | ICD-10-CM | POA: Diagnosis present

## 2019-05-09 DIAGNOSIS — C3492 Malignant neoplasm of unspecified part of left bronchus or lung: Secondary | ICD-10-CM | POA: Diagnosis present

## 2019-05-09 DIAGNOSIS — J45909 Unspecified asthma, uncomplicated: Secondary | ICD-10-CM | POA: Diagnosis present

## 2019-05-09 DIAGNOSIS — N178 Other acute kidney failure: Secondary | ICD-10-CM

## 2019-05-09 DIAGNOSIS — J302 Other seasonal allergic rhinitis: Secondary | ICD-10-CM

## 2019-05-09 DIAGNOSIS — R918 Other nonspecific abnormal finding of lung field: Secondary | ICD-10-CM

## 2019-05-09 DIAGNOSIS — I10 Essential (primary) hypertension: Secondary | ICD-10-CM

## 2019-05-09 DIAGNOSIS — E860 Dehydration: Secondary | ICD-10-CM | POA: Diagnosis present

## 2019-05-09 DIAGNOSIS — R634 Abnormal weight loss: Secondary | ICD-10-CM | POA: Diagnosis present

## 2019-05-09 DIAGNOSIS — N179 Acute kidney failure, unspecified: Secondary | ICD-10-CM | POA: Diagnosis present

## 2019-05-09 DIAGNOSIS — D519 Vitamin B12 deficiency anemia, unspecified: Secondary | ICD-10-CM

## 2019-05-09 DIAGNOSIS — U071 COVID-19: Principal | ICD-10-CM

## 2019-05-09 LAB — CBC
HCT: 37.4 % — ABNORMAL LOW (ref 39.0–52.0)
Hemoglobin: 12.7 g/dL — ABNORMAL LOW (ref 13.0–17.0)
MCH: 26.2 pg (ref 26.0–34.0)
MCHC: 34 g/dL (ref 30.0–36.0)
MCV: 77.1 fL — ABNORMAL LOW (ref 80.0–100.0)
Platelets: 513 10*3/uL — ABNORMAL HIGH (ref 150–400)
RBC: 4.85 MIL/uL (ref 4.22–5.81)
RDW: 15.6 % — ABNORMAL HIGH (ref 11.5–15.5)
WBC: 14.5 10*3/uL — ABNORMAL HIGH (ref 4.0–10.5)
nRBC: 0 % (ref 0.0–0.2)

## 2019-05-09 LAB — RAPID HIV SCREEN (HIV 1/2 AB+AG)
HIV 1/2 Antibodies: NONREACTIVE
HIV-1 P24 Antigen - HIV24: NONREACTIVE

## 2019-05-09 LAB — BASIC METABOLIC PANEL
Anion gap: 13 (ref 5–15)
BUN: 75 mg/dL — ABNORMAL HIGH (ref 8–23)
CO2: 18 mmol/L — ABNORMAL LOW (ref 22–32)
Calcium: 11.7 mg/dL — ABNORMAL HIGH (ref 8.9–10.3)
Chloride: 106 mmol/L (ref 98–111)
Creatinine, Ser: 1.54 mg/dL — ABNORMAL HIGH (ref 0.61–1.24)
GFR calc Af Amer: 49 mL/min — ABNORMAL LOW (ref >60)
GFR calc non Af Amer: 43 mL/min — ABNORMAL LOW (ref >60)
Glucose, Bld: 84 mg/dL (ref 70–99)
Potassium: 4.7 mmol/L (ref 3.5–5.1)
Sodium: 137 mmol/L (ref 135–145)

## 2019-05-09 LAB — FIBRIN DERIVATIVES D-DIMER (ARMC ONLY): Fibrin derivatives D-dimer (ARMC): 1401.73 ng/mL (FEU) — ABNORMAL HIGH (ref 0.00–499.00)

## 2019-05-09 LAB — ABO/RH: ABO/RH(D): A POS

## 2019-05-09 LAB — HIV ANTIBODY (ROUTINE TESTING W REFLEX): HIV Screen 4th Generation wRfx: NONREACTIVE

## 2019-05-09 LAB — PROCALCITONIN: Procalcitonin: 0.31 ng/mL

## 2019-05-09 LAB — FIBRINOGEN: Fibrinogen: 642 mg/dL — ABNORMAL HIGH (ref 210–475)

## 2019-05-09 LAB — FERRITIN: Ferritin: 847 ng/mL — ABNORMAL HIGH (ref 24–336)

## 2019-05-09 LAB — SARS CORONAVIRUS 2 (TAT 6-24 HRS): SARS Coronavirus 2: POSITIVE — AB

## 2019-05-09 MED ORDER — FLUTICASONE PROPIONATE 50 MCG/ACT NA SUSP
2.0000 | Freq: Every day | NASAL | Status: DC
  Administered 2019-05-09 – 2019-05-12 (×3): 2 via NASAL
  Filled 2019-05-09 (×4): qty 16

## 2019-05-09 MED ORDER — SODIUM CHLORIDE 0.9 % IV SOLN
500.0000 mg | INTRAVENOUS | Status: DC
  Administered 2019-05-09: 500 mg via INTRAVENOUS
  Filled 2019-05-09: qty 500

## 2019-05-09 MED ORDER — IPRATROPIUM-ALBUTEROL 0.5-2.5 (3) MG/3ML IN SOLN: 3.0000 mL | RESPIRATORY_TRACT | Status: DC | PRN

## 2019-05-09 MED ORDER — SODIUM CHLORIDE 0.9 % IV SOLN
100.0000 mg | Freq: Every day | INTRAVENOUS | Status: AC
  Administered 2019-05-10 – 2019-05-12 (×4): 100 mg via INTRAVENOUS
  Filled 2019-05-09 (×4): qty 100

## 2019-05-09 MED ORDER — MEGESTROL ACETATE 400 MG/10ML PO SUSP
400.0000 mg | Freq: Every day | ORAL | Status: DC
  Administered 2019-05-10 – 2019-05-12 (×3): 400 mg via ORAL
  Filled 2019-05-09 (×3): qty 10

## 2019-05-09 MED ORDER — ZINC SULFATE 220 (50 ZN) MG PO CAPS
220.0000 mg | ORAL_CAPSULE | Freq: Every day | ORAL | Status: DC
  Administered 2019-05-09 – 2019-05-12 (×4): 220 mg via ORAL
  Filled 2019-05-09 (×4): qty 1

## 2019-05-09 MED ORDER — SODIUM CHLORIDE 0.9 % IV SOLN
2.0000 g | INTRAVENOUS | Status: DC
  Administered 2019-05-09: 2 g via INTRAVENOUS
  Filled 2019-05-09: qty 2

## 2019-05-09 MED ORDER — ENSURE ENLIVE PO LIQD
237.0000 mL | Freq: Three times a day (TID) | ORAL | Status: DC
  Administered 2019-05-09 – 2019-05-11 (×6): 237 mL via ORAL

## 2019-05-09 MED ORDER — SODIUM CHLORIDE 0.9 % IV SOLN
200.0000 mg | Freq: Once | INTRAVENOUS | Status: AC
  Administered 2019-05-09: 200 mg via INTRAVENOUS
  Filled 2019-05-09: qty 200

## 2019-05-09 MED ORDER — ASCORBIC ACID 500 MG PO TABS
500.0000 mg | ORAL_TABLET | Freq: Every day | ORAL | Status: DC
  Administered 2019-05-09 – 2019-05-12 (×4): 500 mg via ORAL
  Filled 2019-05-09 (×4): qty 1

## 2019-05-09 MED ORDER — ADULT MULTIVITAMIN W/MINERALS CH
1.0000 | ORAL_TABLET | Freq: Every day | ORAL | Status: DC
  Administered 2019-05-10 – 2019-05-12 (×3): 1 via ORAL
  Filled 2019-05-09 (×3): qty 1

## 2019-05-09 NOTE — Consult Note (Signed)
Remdesivir - Pharmacy Brief Note   O:  ALT: 20 CXR: Chronic volume loss on the left with chronic left lower lung opacity and possible small left effusion SpO2: 98% on room air   A/P:  Remdesivir 200 mg IVPB once followed by 100 mg IVPB daily x 4 days.   [place] Resident 05/09/2019 4:03 PM

## 2019-05-09 NOTE — Progress Notes (Signed)
Report called to [employer] .  After reporting she said to wait until after shift change to transport pt.  Pt received  1st dose of  remdisivir iv.  Pt being moved due to positive covid results

## 2019-05-09 NOTE — TOC Initial Note (Signed)
Transition of Care [facility]) - Initial/Assessment Note    Patient Details  Name: Corey Carr MRN: [id number] Date of Birth: November 05, 1940  [facility]) CM/SW Contact:    Su Hilt, RN Phone Number: 05/09/2019, 11:20 AM  Clinical Narrative:                  Met with the patient to discuss DC plan and needs, He lives at home with his wife, he stated that he still drives some but his wife drives him mostly He stated that they have a WC, Roltator, several canes and a RW at home and he does not need additional but he does need a wheelchair ramp.  I attempted to reach multiple churches in [place] to see if there was any help in his neighborhood for this, I was unable to reach anyone but did leave messages for 4 churches to get a call back  I called [employer] and they stated that they have rented all of the ramps out and do not have any until after the first of Jan, I provided the patient with their phone number [phone]     Patient Goals and CMS Choice        Expected Discharge Plan and Services                                                Prior Living Arrangements/Services                       Activities of Daily Living Home Assistive Devices/Equipment: None ADL Screening (condition at time of admission) Patient's cognitive ability adequate to safely complete daily activities?: Yes Is the patient deaf or have difficulty hearing?: No Does the patient have difficulty seeing, even when wearing glasses/contacts?: No Does the patient have difficulty concentrating, remembering, or making decisions?: No Patient able to express need for assistance with ADLs?: Yes Does the patient have difficulty dressing or bathing?: No Independently performs ADLs?: Yes (appropriate for developmental age) Does the patient have difficulty walking or climbing stairs?: Yes Weakness of Legs: Both Weakness of Arms/Hands: Both  Permission Sought/Granted                   Emotional Assessment              Admission diagnosis:  Dehydration [E86.0] Weight loss [R63.4] Failure to thrive in adult [R62.7] Failure to thrive (0-17) [R62.51] AKI (acute kidney injury) ([place]) [N17.9] Acute kidney injury ([place]) [N17.9] Patient Active Problem List   Diagnosis Date Noted  . Failure to thrive (0-17) 05/08/2019   PCP:  [name], MD Pharmacy:   [id number] [id number]: [phone]: [phone]    Social Determinants of Health (SDOH) Interventions    Readmission Risk Interventions No flowsheet data found.

## 2019-05-09 NOTE — TOC Progression Note (Signed)
Transition of Care [facility]) - Progression Note    Patient Details  Name: Tonnie Friedel MRN: [id number] Date of Birth: 1940/08/07  Transition of Care [facility]) CM/SW Contact  Su Hilt, RN Phone Number: 05/09/2019, 1:36 PM  Clinical Narrative:    Malachy Mood with [facility] will accept the patient for home health   Expected Discharge Plan: [place] Barriers to Discharge: Continued Medical Work up  Expected Discharge Plan and Services Expected Discharge Plan: [place]   Discharge Planning Services: CM Consult   Living arrangements for the past 2 months: Single Family Home                 DME Arranged: N/A         HH Arranged: PT HH Agency: [place] ([place]) Date [place]: 05/09/19 Time [place]: 9357 Representative spoke with at [place]: [place] ([place]) Interventions    Readmission Risk Interventions No flowsheet data found.

## 2019-05-09 NOTE — Evaluation (Signed)
Physical Therapy Evaluation Patient Details Name: Corey Carr MRN: [id number] DOB: 10/24/1940 Today's Date: 05/09/2019   History of Present Illness  Corey Carr is a 65yoM who comes to [facility] on 12/17 after recent debility/weakness and significant weightloss over the past 6 months. At baseline, pt is a community dwelling adult, still drives, grocery shops, but recently has needed to use his wifes RW or [id number]. Pt denies recent falls or balance issues.  Clinical Impression  Pt admitted with above diagnosis. Pt currently with functional limitations due to the deficits listed below (see "PT Problem List"). Upon entry, pt in bed, awake and agreeable to participate. The pt is alert and oriented x4, pleasant, conversational, and generally a good historian. ModI bed mobility and transfers, supervision for AMB in hallway, but pt fatigued and limited to 152ft. Functional mobility assessment demonstrates increased effort/time requirements, poor tolerance, and need for physical assistance, whereas the patient performed these at a higher level of independence PTA.Pt will benefit from skilled PT intervention to increase independence and safety with basic mobility in preparation for discharge to the venue listed below.       Follow Up Recommendations Home health PT    Equipment Recommendations  None recommended by PT    Recommendations for Other Services       Precautions / Restrictions Precautions Precautions: Fall Restrictions Weight Bearing Restrictions: No      Mobility  Bed Mobility Overal bed mobility: Modified Independent                Transfers Overall transfer level: Modified independent Equipment used: Rolling walker (2 wheeled)             General transfer comment: slow to rise, moderate effort, no apparent balance difficulty.  Ambulation/Gait Ambulation/Gait assistance: Supervision Gait Distance (Feet): 120 Feet Assistive device: Rolling walker (2 wheeled) Gait  Pattern/deviations: Step-to pattern Gait velocity: 0.25m/s   General Gait Details: appears proficient and safe with RW  Stairs            Wheelchair Mobility    Modified Rankin (Stroke Patients Only)       Balance Overall balance assessment: Modified Independent;No apparent balance deficits (not formally assessed)                                           Pertinent Vitals/Pain Pain Assessment: Faces Faces Pain Scale: Hurts a little bit Pain Location: lower ABD Pain Descriptors / Indicators: Aching Pain Intervention(s): Limited activity within patient's tolerance;Monitored during session;Repositioned;RN gave pain meds during session    [place] expects to be discharged to:: Private residence Living Arrangements: Spouse/significant other Available Help at Discharge: Family(Wife who is an HD pt; pt has several siblings in [place], [place], [place], doesn't sound like they could help much) Type of Home: Mobile home Home Access: Stairs to enter(Pt interested in getting a ramp set up as he/nor wife are unable to AMB stairs while carrying a load.) Entrance Stairs-Rails: Left;Right Entrance Stairs-Number of Steps: 4 Home Layout: One level Home Equipment: Walker - 2 wheels;Walker - 4 wheels;Cane - single point      Prior Function Level of Independence: Independent with assistive device(s)               Hand Dominance        Extremity/Trunk Assessment   Upper Extremity Assessment Upper Extremity Assessment: Generalized weakness;Overall [facility] for tasks assessed  Lower Extremity Assessment Lower Extremity Assessment: Generalized weakness;Overall [facility] for tasks assessed    Cervical / Trunk Assessment Cervical / Trunk Assessment: Kyphotic  Communication   Communication: No difficulties  Cognition Arousal/Alertness: Awake/alert Behavior During Therapy: WFL for tasks assessed/performed Overall Cognitive Status: Within Functional  Limits for tasks assessed                                        General Comments      Exercises     Assessment/Plan    PT Assessment    PT Problem List Decreased strength;Decreased activity tolerance;Decreased mobility       PT Treatment Interventions DME instruction;Gait training;Stair training;Functional mobility training;Therapeutic activities;Therapeutic exercise;Patient/family education    PT Goals (Current goals can be found in the Care Plan section)  Acute Rehab PT Goals Patient Stated Goal: regain strength PT Goal Formulation: With patient Time For Goal Achievement: 05/23/19 Potential to Achieve Goals: Fair    Frequency Min 2X/week   Barriers to discharge Decreased caregiver support      Co-evaluation               AM-PAC PT "6 Clicks" Mobility  Outcome Measure Help needed turning from your back to your side while in a flat bed without using bedrails?: None Help needed moving from lying on your back to sitting on the side of a flat bed without using bedrails?: None Help needed moving to and from a bed to a chair (including a wheelchair)?: None Help needed standing up from a chair using your arms (e.g., wheelchair or bedside chair)?: None Help needed to walk in hospital room?: A Little Help needed climbing 3-5 steps with a railing? : A Little 6 Click Score: 22    End of Session Equipment Utilized During Treatment: Gait belt Activity Tolerance: Patient tolerated treatment well;No increased pain;Patient limited by fatigue Patient left: in chair;with chair alarm set;with family/visitor present;with call bell/phone within reach Nurse Communication: Mobility status PT Visit Diagnosis: Difficulty in walking, not elsewhere classified (R26.2);Muscle weakness (generalized) (M62.81);Adult, failure to thrive (R62.7)    Time: 1610-9604 PT Time Calculation (min) (ACUTE ONLY): 28 min   Charges:   PT Evaluation $PT Eval Low Complexity: 1 Low PT  Treatments $Therapeutic Exercise: 8-22 mins       12:15 PM, 05/09/19 Etta Grandchild, PT, DPT Physical Therapist - [facility]  [phone] ([place])   Leandro Berkowitz C 05/09/2019, 12:04 PM

## 2019-05-09 NOTE — Progress Notes (Addendum)
PROGRESS NOTE    Corey Carr  PIR:[id number] DOB: [birth year] DOA: 05/08/2019  PCP: [name], MD    LOS - 1   Brief Narrative:  78 y.o. cachectic Caucasian male with a known history of hypertension, asthma, seasonal allergy and vitamin B12 deficiency, who presented to the emergency room with generalized weakness and ongoing recent weight loss.  Estimates 41 pounds lost over past 6 months.  Reported loose stools for past 3-4 weeks, but no N/V.  Also with recent dyspnea and cough, no wheezing.  No fever/chills or known sick contacts.  In the ED, tachypneic but otherwise stable vitals, no fever or hypoxia.  ABG showed 7.38 / 28 / 16.6.  Carboxyhemoglobin and methemoglobin within normal.  CMP showed hyperkalemia 5.3, BUN 87, creatinine 2.09, anion gap 17.  High-sensitive troponin mildly elevated 19.  CBC showed leukoctosis 15k (neutrophilia).  Two-view chest x-ray showed COPD, chronic volume loss on the left with chronic left lower lung opacity and possible small left pleural effusion.  Given 1 L bolus of IV normal saline and admitted for further evaluation and management.  ADDENDUM: Covid-19 PCR - POSITIVE.  Was recently negative on 12/9.   Subjective 12/18: Patient up in chair when seen this AM.  He reports feeling a little better.  Asks when he can go home.  Denies fever/chills, N/V/D, chest pain or SOB.  No acute events reported overnight.  Patient states he is aware of abnormal CT of his chest and concern for malignancy.  He says his only recent cough has been occasionally after swallowing medications.  Assessment & Plan:   Principal Problem:   Failure to thrive (0-17) Active Problems:   AKI (acute kidney injury) ([place])   Dehydration   Abnormal CT lung screening   Essential hypertension   Asthma   Unintentional weight loss   Vitamin B12 deficiency   Seasonal allergies   ADDENDUM:  Covid-19 Virus Detected - not hypoxic, pretty much asymptomatic other than weakness and  FTT. --started remdesivir, vitamin C, zinc --not hypoxic, so will hold off steroids for now     Failure to thrive - most likely patient has underlying malignancy causing this.  Work up pending, as outlined below. --continue IV hydration --dietician consulted --continue Megace --PT/OT --Palliative consult  AKI (acute kidney injury)  - pre-renal in setting of dehydration and poor PO intake.  Improving --continue IV fluids for now --monitor BMP --renally dose meds as indicated and avoid nephrotoxins  Dehydration - due to poor PO intake - management as above.  Abnormal CT lung screening and Abnormal Chest Xray on admission - former smoker, most recent screening CT on 03/06/2019 with Lung-RADS 4B, suspicious due to multiple abnormalities including soft tissue density throughout entire left lower lobe, diffuse scarring and fibrosis.  Admission chest xray consistent with these findings.  Patient seen by pulmonology in follow up, has scheduled PET scan on 05/12/19.  Likely to need biopsy for tissue diagnosis and further staging to determine any potential therapies.  Discussed case with Dr. Lanney Gins, pulmonology. --initially started on Rocephin and Azithromycin for presumed CA-PNA --stop antibiotics as patient without acute respiratory symptoms, fevers, chills.  Suspect leukocytosis is reactive. --monitor clinically and will resume antibiotics if needed and would cover for for possible aspiration --SLP consulted to evaluate swallow   Unintentional Weight Loss - concern for lung malignancy given abnormal CT highly suspicious. --Dietician consulted, appreciate recommendations  Essential hypertension - chronic, stable.   --home losartan/HCTZ held for now due to AKI and patient  has been normotensive --may not need to resume med, monitor BP closely  Asthma - not acutely exacerbated.  Home regimen is Trelegy ellipta.   --Amedeo Kinsman and Duonebs ordered  Vitamin B12 deficiency --continue  home supplement  Seasonal Allergies --continue home antihistamine   DVT prophylaxis: heparin   Code Status: Full Code  Family Communication: none at bedside  Disposition Plan:  Expect d/c home pending clinical improvement and PT eval.     Consultants:   Palliative  Procedures:   None  Antimicrobials:   Rocephin and Azithromycin, start 12/17, stop 12/18    Objective: Vitals:   05/09/19 0449 05/09/19 0532 05/09/19 0556 05/09/19 0901  BP: 140/74 136/69  114/64  Pulse: 83 93  93  Resp:  18  14  Temp:  97.7 F (36.5 C)    TempSrc:  Oral    SpO2: 98%     Weight:   47 kg   Height:   5\' 8"  (1.727 m)     Intake/Output Summary (Last 24 hours) at 05/09/2019 1402 Last data filed at 05/09/2019 1340 Gross per 24 hour  Intake --  Output 495 ml  Net -495 ml   Filed Weights   05/08/19 1756 05/09/19 0556  Weight: 47.4 kg 47 kg    Examination:  General exam: awake, alert, no acute distress, cachectic HEENT: dry mucus membranes, hearing grossly normal  Respiratory system: clear to auscultation bilaterally with diminished breath sounds on left, no wheezes, rales or rhonchi, normal respiratory effort. Cardiovascular system: normal S1/S2, RRR, no JVD, murmurs, rubs, gallops, no pedal edema.   Gastrointestinal system: soft, non-tender, non-distended abdomen, no organomegaly or masses felt, normal bowel sounds. Central nervous system: alert and oriented x3. no gross focal neurologic deficits, normal speech Extremities: moves all, no edema, normal tone Skin: dry, intact, normal temperature Psychiatry: normal mood, congruent affect, judgement and insight appear normal    Data Reviewed: I have personally reviewed following labs and imaging studies  CBC: Recent Labs  Lab 05/08/19 1833 05/09/19 0750  WBC 15.0* 14.5*  NEUTROABS 11.9*  --   HGB 12.7* 12.7*  HCT 39.9 37.4*  MCV 82.4 77.1*  PLT 517* 962*   Basic Metabolic Panel: Recent Labs  Lab 05/08/19 2024  05/09/19 0750  NA 135 137  K 5.3* 4.7  CL 102 106  CO2 16* 18*  GLUCOSE 118* 84  BUN 87* 75*  CREATININE 2.09* 1.54*  CALCIUM 12.3* 11.7*  MG 2.4  --   PHOS 4.4  --    GFR: Estimated Creatinine Clearance: 26.3 mL/min (A) (by C-G formula based on SCr of 1.54 mg/dL (H)). Liver Function Tests: Recent Labs  Lab 05/08/19 2024  AST 25  ALT 20  ALKPHOS 98  BILITOT 0.7  PROT 8.8*  ALBUMIN 4.1   No results for input(s): LIPASE, AMYLASE in the last 168 hours. No results for input(s): AMMONIA in the last 168 hours. Coagulation Profile: No results for input(s): INR, PROTIME in the last 168 hours. Cardiac Enzymes: No results for input(s): CKTOTAL, CKMB, CKMBINDEX, TROPONINI in the last 168 hours. BNP (last 3 results) No results for input(s): PROBNP in the last 8760 hours. HbA1C: No results for input(s): HGBA1C in the last 72 hours. CBG: No results for input(s): GLUCAP in the last 168 hours. Lipid Profile: No results for input(s): CHOL, HDL, LDLCALC, TRIG, CHOLHDL, LDLDIRECT in the last 72 hours. Thyroid Function Tests: No results for input(s): TSH, T4TOTAL, FREET4, T3FREE, THYROIDAB in the last 72 hours. Anemia Panel:  No results for input(s): VITAMINB12, FOLATE, FERRITIN, TIBC, IRON, RETICCTPCT in the last 72 hours. Sepsis Labs: No results for input(s): PROCALCITON, LATICACIDVEN in the last 168 hours.  Recent Results (from the past 240 hour(s))  SARS CORONAVIRUS 2 (TAT 6-24 HRS) Nasopharyngeal Nasopharyngeal Swab     Status: None   Collection Time: 04/30/19 11:56 AM   Specimen: Nasopharyngeal Swab  Result Value Ref Range Status   SARS Coronavirus 2 NEGATIVE NEGATIVE Final    Comment: (NOTE) SARS-CoV-2 target nucleic acids are NOT DETECTED. The SARS-CoV-2 RNA is generally detectable in upper and lower respiratory specimens during the acute phase of infection. Negative results do not preclude SARS-CoV-2 infection, do not rule out co-infections with other pathogens, and  should not be used as the sole basis for treatment or other patient management decisions. Negative results must be combined with clinical observations, patient history, and epidemiological information. The expected result is Negative. Fact Sheet for Patients: SugarRoll.be Fact Sheet for Healthcare Providers: https://www.woods-mathews.com/ This test is not yet approved or cleared by the [place] FDA and  has been authorized for detection and/or diagnosis of SARS-CoV-2 by FDA under an Emergency Use Authorization (EUA). This EUA will remain  in effect (meaning this test can be used) for the duration of the COVID-19 declaration under Section 56 4(b)(1) of the Act, 21 U.S.C. section 360bbb-3(b)(1), unless the authorization is terminated or revoked sooner. Performed at [facility], [place] [address] [place], [place] [address]          Radiology Studies: DG Chest 2 View  Result Date: 05/08/2019 CLINICAL DATA:  Weakness EXAM: CHEST - 2 VIEW COMPARISON:  CT 03/06/2019 FINDINGS: There is hyperinflation of the lungs compatible with COPD. Continued opacification in the left lower lung is stable since prior CT. No confluent opacity on the right. Possible small left effusion. Volume loss on the left with shift of mediastinal structures to the left. Heart is normal size. No acute bony abnormality. IMPRESSION: COPD. Chronic volume loss on the left with chronic left lower lung opacity and possible small left effusion. Electronically Signed   By: Rolm Baptise M.D.   On: 05/08/2019 18:45   US RENAL  Result Date: 05/09/2019 CLINICAL DATA:  Acute renal injury. EXAM: RENAL / URINARY TRACT ULTRASOUND COMPLETE COMPARISON:  None. FINDINGS: Right Kidney: Renal measurements: 9.3 x 4.4 x 3.9 cm = volume: 84 mL. The right kidney is isoechoic to the index organ, the liver. No mass lesion or stone is present. There is no hydronephrosis. Left Kidney: Renal  measurements: 8.6 x 4.7 x 4.7 cm = volume: 100 mL. The left kidney is isoechoic to the index organ, the spleen. No mass lesion or stone is present. There is no hydronephrosis. Bladder: Diffuse bladder wall thickening is noted. No discrete mass lesion is present. Diverticula are noted. Other: The prostate is enlarged, measuring up to 5.2 cm in transverse diameter IMPRESSION: 1. Kidneys are mildly echogenic bilaterally. This is nonspecific, but can be seen in the setting of medical renal disease. 2. No urinary tract obstruction. 3. Diffuse bladder wall thickening. Question infection or cystitis. No discrete mass lesion is present. 4. Prostate hypertrophy. Electronically Signed   By: San Morelle M.D.   On: 05/09/2019 07:48        Scheduled Meds: . vitamin C  250 mg Oral Daily  . cholecalciferol  1,000 Units Oral Daily  . feeding supplement (ENSURE ENLIVE)  237 mL Oral TID BM  . fluticasone furoate-vilanterol  1 puff  Inhalation Daily   And  . umeclidinium bromide  1 puff Inhalation Daily  . heparin injection (subcutaneous)  5,000 Units Subcutaneous Q8H  . loratadine  10 mg Oral Daily  . [START ON 05/10/2019] megestrol  400 mg Oral Daily  . [START ON 05/10/2019] multivitamin with minerals  1 tablet Oral Daily  . omega-3 acid ethyl esters  1 g Oral Daily  . vitamin B-12  1,000 mcg Oral Daily  . vitamin E  400 Units Oral Daily   Continuous Infusions: . sodium chloride Stopped (05/08/19 2350)  . sodium chloride 100 mL/hr at 05/09/19 0023  . azithromycin 500 mg (05/09/19 1113)  . cefTRIAXone (ROCEPHIN)  IV 2 g (05/09/19 1055)     LOS: 1 day    Time spent: 40-45 minutes    Ezekiel Slocumb, DO [facility] Pager: [phone]  If 7PM-7AM, please contact night-coverage [email or web address] Password [email or web address] 05/09/2019, 2:02 PM

## 2019-05-09 NOTE — Progress Notes (Signed)
Initial Nutrition Assessment  DOCUMENTATION CODES:   Underweight  INTERVENTION:   Ensure Enlive po TID, each supplement provides 350 kcal and 20 grams of protein  Magic cup TID with meals, each supplement provides 290 kcal and 9 grams of protein  MVI daily   Pt likely at high refeed risk; recommend monitor K, Mg and P labs daily as oral intake improves  NUTRITION DIAGNOSIS:   Increased nutrient needs related to catabolic illness(COPD) as evidenced by increased estimated needs.  GOAL:   Patient will meet greater than or equal to 90% of their needs  MONITOR:   PO intake, Supplement acceptance, Labs, Weight trends, Skin, I & O's  REASON FOR ASSESSMENT:   Malnutrition Screening Tool    ASSESSMENT:   77 y.o. male with a known history of hypertension, asthma, seasonal allergy and vitamin B12 deficiency, who presented to the emergency room with acute onset of weight loss and weakness   Spoke with pt via phone. Pt reports poor appetite and oral intake for several months pta but reports his appetite has been extremely poor over the past month. Pt reports progressive weight loss. Per chart, pt has lost 29lbs(22%) over the past year; this is significant. Pt had just started to drink nutritional supplements 1 day pta; pt would like to have these ordered while in hospital (prefers chocolate). RD will add Ensure and Magic Cups. Pt initiated on a dysphagia 3 diet today. Pt waiting for his meal tray at time of RD visit and reports he is going to try to eat something.   Pt at high risk for malnutrition but unable to diagnose at this time as NFPE cannot be performed until COVID 19 results return negative.   Medications reviewed and include: vitamin C, D3, heparin, megace, lovaza, B12, vitamin E, NaCl @100ml /hr, azithromycin, ceftriaxone   Labs reviewed: BUN 75(H), creat 1.54(H), Ca 11.7(H), P 4.4 wnl, Mg 2.4 wnl Wbc- 14.5(H)  Unable to complete Nutrition-Focused physical exam at this time  as COVID 19 test pending.   Diet Order:   Diet Order            DIET DYS 3 Room service appropriate? Yes; Fluid consistency: Thin  Diet effective now             EDUCATION NEEDS:   Education needs have been addressed  Skin:  Skin Assessment: Reviewed RN Assessment  Last BM:  pta  Height:   Ht Readings from Last 1 Encounters:  05/09/19 5\' 8"  (1.727 m)    Weight:   Wt Readings from Last 1 Encounters:  05/09/19 47 kg    Ideal Body Weight:  70 kg  BMI:  Body mass index is 15.75 kg/m.  Estimated Nutritional Needs:   Kcal:  1700-1900kcal/day  Protein:  85-95g/day  Fluid:  >1.2-1.4L/day  Koleen Distance MS, RD, LDN Pager #- [phone] Office#- [phone] After Hours Pager: [phone]

## 2019-05-09 NOTE — Progress Notes (Signed)
Pt to now go to  rm 220.  Report called to [facility] rn will transport pt

## 2019-05-10 ENCOUNTER — Inpatient Hospital Stay: Payer: Self-pay

## 2019-05-10 DIAGNOSIS — U071 COVID-19: Secondary | ICD-10-CM

## 2019-05-10 LAB — CBC WITH DIFFERENTIAL/PLATELET
Abs Immature Granulocytes: 0.05 10*3/uL (ref 0.00–0.07)
Basophils Absolute: 0.1 10*3/uL (ref 0.0–0.1)
Basophils Relative: 1 %
Eosinophils Absolute: 0.6 10*3/uL — ABNORMAL HIGH (ref 0.0–0.5)
Eosinophils Relative: 4 %
HCT: 34.7 % — ABNORMAL LOW (ref 39.0–52.0)
Hemoglobin: 11.3 g/dL — ABNORMAL LOW (ref 13.0–17.0)
Immature Granulocytes: 0 %
Lymphocytes Relative: 15 %
Lymphs Abs: 2 10*3/uL (ref 0.7–4.0)
MCH: 26.3 pg (ref 26.0–34.0)
MCHC: 32.6 g/dL (ref 30.0–36.0)
MCV: 80.9 fL (ref 80.0–100.0)
Monocytes Absolute: 1.4 10*3/uL — ABNORMAL HIGH (ref 0.1–1.0)
Monocytes Relative: 11 %
Neutro Abs: 9.4 10*3/uL — ABNORMAL HIGH (ref 1.7–7.7)
Neutrophils Relative %: 69 %
Platelets: 460 10*3/uL — ABNORMAL HIGH (ref 150–400)
RBC: 4.29 MIL/uL (ref 4.22–5.81)
RDW: 15.8 % — ABNORMAL HIGH (ref 11.5–15.5)
WBC: 13.5 10*3/uL — ABNORMAL HIGH (ref 4.0–10.5)
nRBC: 0 % (ref 0.0–0.2)

## 2019-05-10 LAB — URINALYSIS, ROUTINE W REFLEX MICROSCOPIC
Bilirubin Urine: NEGATIVE
Glucose, UA: 50 mg/dL — AB
Hgb urine dipstick: NEGATIVE
Ketones, ur: NEGATIVE mg/dL
Leukocytes,Ua: NEGATIVE
Nitrite: NEGATIVE
Protein, ur: NEGATIVE mg/dL
Specific Gravity, Urine: 1.017 (ref 1.005–1.030)
pH: 5 (ref 5.0–8.0)

## 2019-05-10 LAB — COMPREHENSIVE METABOLIC PANEL
ALT: 26 U/L (ref 0–44)
AST: 34 U/L (ref 15–41)
Albumin: 3.3 g/dL — ABNORMAL LOW (ref 3.5–5.0)
Alkaline Phosphatase: 79 U/L (ref 38–126)
Anion gap: 11 (ref 5–15)
BUN: 46 mg/dL — ABNORMAL HIGH (ref 8–23)
CO2: 20 mmol/L — ABNORMAL LOW (ref 22–32)
Calcium: 10.8 mg/dL — ABNORMAL HIGH (ref 8.9–10.3)
Chloride: 107 mmol/L (ref 98–111)
Creatinine, Ser: 0.94 mg/dL (ref 0.61–1.24)
GFR calc Af Amer: >60 mL/min (ref >60)
GFR calc non Af Amer: >60 mL/min (ref >60)
Glucose, Bld: 77 mg/dL (ref 70–99)
Potassium: 5 mmol/L (ref 3.5–5.1)
Sodium: 138 mmol/L (ref 135–145)
Total Bilirubin: 0.6 mg/dL (ref 0.3–1.2)
Total Protein: 7 g/dL (ref 6.5–8.1)

## 2019-05-10 LAB — FIBRIN DERIVATIVES D-DIMER (ARMC ONLY): Fibrin derivatives D-dimer (ARMC): 1330.02 ng/mL (FEU) — ABNORMAL HIGH (ref 0.00–499.00)

## 2019-05-10 LAB — STREP PNEUMONIAE URINARY ANTIGEN: Strep Pneumo Urinary Antigen: NEGATIVE

## 2019-05-10 LAB — PHOSPHORUS: Phosphorus: 2.3 mg/dL — ABNORMAL LOW (ref 2.5–4.6)

## 2019-05-10 LAB — C-REACTIVE PROTEIN: CRP: 8.3 mg/dL — ABNORMAL HIGH (ref <1.0)

## 2019-05-10 LAB — MAGNESIUM: Magnesium: 1.8 mg/dL (ref 1.7–2.4)

## 2019-05-10 NOTE — Progress Notes (Signed)
PROGRESS NOTE    Corey Carr  LOV:[id number] DOB: 30-Apr-1941 DOA: 05/08/2019  PCP: [name], MD    LOS - 2   Brief Narrative:  78 y.o.cachectic Caucasian malewith a known history ofhypertension, asthma, seasonal allergy and vitamin B12 deficiency, who presented to the emergency room with generalized weakness and ongoing recent weight loss. Estimates 41 pounds lost over past 6 months.  Reported loose stools for past 3-4 weeks, but no N/V.  Also with recent dyspnea and cough, no wheezing.  No fever/chills or known sick contacts.  In the ED, tachypneic but otherwise stable vitals, no fever or hypoxia.  ABG showed 7.38 / 28 / 16.6.  Carboxyhemoglobin and methemoglobin within normal.  CMP showed hyperkalemia 5.3, BUN 87, creatinine 2.09, anion gap 17.  High-sensitive troponin mildly elevated 19.  CBC showed leukoctosis 15k (neutrophilia).  Two-view chest x-ray showed COPD, chronic volume loss on the left with chronic left lower lung opacity and possible small left pleural effusion.  Given 1 L bolus of IV normal saline and admitted for further evaluation and management.  Patient tested POSITIVE for Covid-19 by PCR.  Was recently negative on 12/9.  Not hypoxic and really no respiratory symptoms.  Started on Remdesivir 12/18.    Subjective 12/19: Patient awake sitting up in bed when seen today.  Reports feeling just fine.  Denies complaints including fever/chills, SOB, chest pain, cough, N/V/D.  No acute events reported.  Assessment & Plan:   Principal Problem:   Failure to thrive (0-17) Active Problems:   AKI (acute kidney injury) ([place])   Dehydration   COVID-19 virus detected   Abnormal CT lung screening   Essential hypertension   Asthma   Unintentional weight loss   Vitamin B12 deficiency   Seasonal allergies   Covid-19 Virus Detected - not hypoxic, pretty much asymptomatic other than weakness and FTT. --continue remdesivir, started 12/18 --continue, vitamin C,  zinc --not hypoxic, hold off on steroids   Failure to thrive - most likely patient has underlying malignancy causing this.  Work up pending, as outlined below. --continue IV hydration --dietician consulted --continue Megace --Dietician consult --PT/OT - recommending HHPT --Palliative consult  AKI (acute kidney injury)  - Resolved.  Pre-renal in setting of dehydration and poor PO intake.  Improving --will continue gentle IV fluids just for maintenance --monitor BMP --renally dose meds as indicated and avoid nephrotoxins  Dehydration - due to poor PO intake - management as above.  Abnormal CT lung screening and Abnormal Chest Xray on admission - former smoker, most recent screening CT on 03/06/2019 with Lung-RADS 4B, suspicious due to multiple abnormalities including soft tissue density throughout entire left lower lobe, diffuse scarring and fibrosis.  Admission chest xray consistent with these findings.  Patient seen by pulmonology in follow up, has scheduled PET scan on 05/12/19.  Need biopsy for tissue diagnosis and further staging to determine any potential therapies.  Discussed case with Dr. Lanney Gins, pulmonology. --initially started on Rocephin and Azithromycin for presumed CA-PNA --stop antibiotics as patient without acute respiratory symptoms, fevers, chills.  Suspect leukocytosis is reactive. --monitor clinically and will resume antibiotics if needed and would cover for for possible aspiration --given positive COVID-19 test, will need to hold off on biopsy at this time.  Follow up outpatient.  Unintentional Weight Loss - concern for lung malignancy given abnormal CT highly suspicious. --Dietician consulted, appreciate recommendations  Essential hypertension - chronic, stable.   --home losartan/HCTZ held for now due to AKI and patient has been normotensive --  may not need to resume med, monitor BP closely  Asthma - not acutely exacerbated.  Home regimen is Trelegy ellipta.    --Amedeo Kinsman and Duonebs ordered  Vitamin B12 deficiency --continue home supplement  Seasonal Allergies --continue home antihistamine   DVT prophylaxis: heparin   Code Status: Full Code  Family Communication: wife updated by phone today Disposition Plan:  Expect d/c home in 1-2 days, with HHPT.  Patient needs ramp at his home, SW involved.  Lives with wife and has been mostly independent prior to recent decline.   Consultants:   Palliative  Procedures:   None  Antimicrobials:   Rocephin and Zithromax 12/17-12/18    Objective: Vitals:   05/09/19 0556 05/09/19 0901 05/09/19 2026 05/10/19 0531  BP:  114/64 119/72 117/68  Pulse:  93 81 87  Resp:  14 20 20   Temp:   98.1 F (36.7 C) 98 F (36.7 C)  TempSrc:   Oral Oral  SpO2:   95% 100%  Weight: 47 kg     Height: 5\' 8"  (1.727 m)       Intake/Output Summary (Last 24 hours) at 05/10/2019 0803 Last data filed at 05/10/2019 0645 Gross per 24 hour  Intake 2292.71 ml  Output 950 ml  Net 1342.71 ml   Filed Weights   05/08/19 1756 05/09/19 0556  Weight: 47.4 kg 47 kg    Examination:  General exam: awake, alert, no acute distress, cachectic, pleasant Respiratory system: clear to auscultation bilaterally, no wheezes, rales or rhonchi, normal respiratory effort. Cardiovascular system: normal S1/S2, RRR, no JVD, murmurs, rubs, gallops, no pedal edema.   Gastrointestinal system: soft, non-tender, non-distended abdomen Central nervous system: alert and oriented x4. no gross focal neurologic deficits, normal speech Extremities: moves all, no edema, normal tone Psychiatry: normal mood, congruent affect, judgement and insight appear normal    Data Reviewed: I have personally reviewed following labs and imaging studies  CBC: Recent Labs  Lab 05/08/19 1833 05/09/19 0750 05/10/19 0535  WBC 15.0* 14.5* 13.5*  NEUTROABS 11.9*  --  9.4*  HGB 12.7* 12.7* 11.3*  HCT 39.9 37.4* 34.7*  MCV 82.4 77.1* 80.9  PLT  517* 513* 481*   Basic Metabolic Panel: Recent Labs  Lab 05/08/19 2024 05/09/19 0750 05/10/19 0535  NA 135 137 138  K 5.3* 4.7 5.0  CL 102 106 107  CO2 16* 18* 20*  GLUCOSE 118* 84 77  BUN 87* 75* 46*  CREATININE 2.09* 1.54* 0.94  CALCIUM 12.3* 11.7* 10.8*  MG 2.4  --  1.8  PHOS 4.4  --  2.3*   GFR: Estimated Creatinine Clearance: 43.1 mL/min (by C-G formula based on SCr of 0.94 mg/dL). Liver Function Tests: Recent Labs  Lab 05/08/19 2024 05/10/19 0535  AST 25 34  ALT 20 26  ALKPHOS 98 79  BILITOT 0.7 0.6  PROT 8.8* 7.0  ALBUMIN 4.1 3.3*   No results for input(s): LIPASE, AMYLASE in the last 168 hours. No results for input(s): AMMONIA in the last 168 hours. Coagulation Profile: No results for input(s): INR, PROTIME in the last 168 hours. Cardiac Enzymes: No results for input(s): CKTOTAL, CKMB, CKMBINDEX, TROPONINI in the last 168 hours. BNP (last 3 results) No results for input(s): PROBNP in the last 8760 hours. HbA1C: No results for input(s): HGBA1C in the last 72 hours. CBG: No results for input(s): GLUCAP in the last 168 hours. Lipid Profile: No results for input(s): CHOL, HDL, LDLCALC, TRIG, CHOLHDL, LDLDIRECT in the last 72 hours. Thyroid  Function Tests: No results for input(s): TSH, T4TOTAL, FREET4, T3FREE, THYROIDAB in the last 72 hours. Anemia Panel: Recent Labs    05/09/19 1728  FERRITIN 847*   Sepsis Labs: Recent Labs  Lab 05/09/19 1728  PROCALCITON 0.31    Recent Results (from the past 240 hour(s))  SARS CORONAVIRUS 2 (TAT 6-24 HRS) Nasopharyngeal Nasopharyngeal Swab     Status: None   Collection Time: 04/30/19 11:56 AM   Specimen: Nasopharyngeal Swab  Result Value Ref Range Status   SARS Coronavirus 2 NEGATIVE NEGATIVE Final    Comment: (NOTE) SARS-CoV-2 target nucleic acids are NOT DETECTED. The SARS-CoV-2 RNA is generally detectable in upper and lower respiratory specimens during the acute phase of infection. Negative results do  not preclude SARS-CoV-2 infection, do not rule out co-infections with other pathogens, and should not be used as the sole basis for treatment or other patient management decisions. Negative results must be combined with clinical observations, patient history, and epidemiological information. The expected result is Negative. Fact Sheet for Patients: SugarRoll.be Fact Sheet for Healthcare Providers: https://www.woods-mathews.com/ This test is not yet approved or cleared by the [place] FDA and  has been authorized for detection and/or diagnosis of SARS-CoV-2 by FDA under an Emergency Use Authorization (EUA). This EUA will remain  in effect (meaning this test can be used) for the duration of the COVID-19 declaration under Section 56 4(b)(1) of the Act, 21 U.S.C. section 360bbb-3(b)(1), unless the authorization is terminated or revoked sooner. Performed at [facility], [place] [address], [place], [place] [address]   SARS CORONAVIRUS 2 (TAT 6-24 HRS) Nasopharyngeal Nasopharyngeal Swab     Status: Abnormal   Collection Time: 05/08/19 10:55 PM   Specimen: Nasopharyngeal Swab  Result Value Ref Range Status   SARS Coronavirus 2 POSITIVE (A) NEGATIVE Final    Comment: RESULT CALLED TO, READ BACK BY AND VERIFIED WITH: F.MILES RN [id number] 05/09/2019 MCCORMICK K (NOTE) SARS-CoV-2 target nucleic acids are DETECTED. The SARS-CoV-2 RNA is generally detectable in upper and lower respiratory specimens during the acute phase of infection. Positive results are indicative of the presence of SARS-CoV-2 RNA. Clinical correlation with patient history and other diagnostic information is  necessary to determine patient infection status. Positive results do not rule out bacterial infection or co-infection with other viruses.  The expected result is Negative. Fact Sheet for Patients: SugarRoll.be Fact Sheet for Healthcare  Providers: https://www.woods-mathews.com/ This test is not yet approved or cleared by the [place] FDA and  has been authorized for detection and/or diagnosis of SARS-CoV-2 by FDA under an Emergency Use Authorization (EUA). This EUA will remain  in effect (meaning this test can be used) for  the duration of the COVID-19 declaration under Section 564(b)(1) of the Act, 21 U.S.C. section 360bbb-3(b)(1), unless the authorization is terminated or revoked sooner. Performed at [facility], [place] [address] [place], [place] [address]   CULTURE, BLOOD (ROUTINE X 2) w Reflex to ID Panel     Status: None (Preliminary result)   Collection Time: 05/09/19  7:50 AM   Specimen: BLOOD  Result Value Ref Range Status   Specimen Description BLOOD LH  Final   Special Requests   Final    BOTTLES DRAWN AEROBIC AND ANAEROBIC Blood Culture adequate volume   Culture   Final    NO GROWTH < 24 HOURS Performed at [facility], [address], [place], [place] [address]    Report Status PENDING  Incomplete  CULTURE, BLOOD (  ROUTINE X 2) w Reflex to ID Panel     Status: None (Preliminary result)   Collection Time: 05/09/19  7:59 AM   Specimen: BLOOD  Result Value Ref Range Status   Specimen Description BLOOD LAC  Final   Special Requests   Final    BOTTLES DRAWN AEROBIC AND ANAEROBIC Blood Culture adequate volume   Culture   Final    NO GROWTH < 24 HOURS Performed at [facility], [address] [place], [place] [address]    Report Status PENDING  Incomplete         Radiology Studies: DG Chest 2 View  Result Date: 05/08/2019 CLINICAL DATA:  Weakness EXAM: CHEST - 2 VIEW COMPARISON:  CT 03/06/2019 FINDINGS: There is hyperinflation of the lungs compatible with COPD. Continued opacification in the left lower lung is stable since prior CT. No confluent opacity on the right. Possible small left effusion. Volume loss on the left with shift of mediastinal structures  to the left. Heart is normal size. No acute bony abnormality. IMPRESSION: COPD. Chronic volume loss on the left with chronic left lower lung opacity and possible small left effusion. Electronically Signed   By: Rolm Baptise M.D.   On: 05/08/2019 18:45   US RENAL  Result Date: 05/09/2019 CLINICAL DATA:  Acute renal injury. EXAM: RENAL / URINARY TRACT ULTRASOUND COMPLETE COMPARISON:  None. FINDINGS: Right Kidney: Renal measurements: 9.3 x 4.4 x 3.9 cm = volume: 84 mL. The right kidney is isoechoic to the index organ, the liver. No mass lesion or stone is present. There is no hydronephrosis. Left Kidney: Renal measurements: 8.6 x 4.7 x 4.7 cm = volume: 100 mL. The left kidney is isoechoic to the index organ, the spleen. No mass lesion or stone is present. There is no hydronephrosis. Bladder: Diffuse bladder wall thickening is noted. No discrete mass lesion is present. Diverticula are noted. Other: The prostate is enlarged, measuring up to 5.2 cm in transverse diameter IMPRESSION: 1. Kidneys are mildly echogenic bilaterally. This is nonspecific, but can be seen in the setting of medical renal disease. 2. No urinary tract obstruction. 3. Diffuse bladder wall thickening. Question infection or cystitis. No discrete mass lesion is present. 4. Prostate hypertrophy. Electronically Signed   By: San Morelle M.D.   On: 05/09/2019 07:48        Scheduled Meds: . vitamin C  500 mg Oral Daily  . cholecalciferol  1,000 Units Oral Daily  . feeding supplement (ENSURE ENLIVE)  237 mL Oral TID BM  . fluticasone  2 spray Each Nare Daily  . fluticasone furoate-vilanterol  1 puff Inhalation Daily   And  . umeclidinium bromide  1 puff Inhalation Daily  . heparin injection (subcutaneous)  5,000 Units Subcutaneous Q8H  . loratadine  10 mg Oral Daily  . megestrol  400 mg Oral Daily  . multivitamin with minerals  1 tablet Oral Daily  . omega-3 acid ethyl esters  1 g Oral Daily  . vitamin B-12  1,000 mcg Oral  Daily  . vitamin E  400 Units Oral Daily  . zinc sulfate  220 mg Oral Daily   Continuous Infusions: . sodium chloride Stopped (05/08/19 2350)  . sodium chloride 100 mL/hr at 05/10/19 0645  . remdesivir 100 mg in NS 100 mL       LOS: 2 days    Time spent: 30-35 minutes    Ezekiel Slocumb, DO [facility] Pager: [phone]  If 7PM-7AM, please contact night-coverage  [email or web address] Password [email or web address] 05/10/2019, 8:03 AM

## 2019-05-10 NOTE — Evaluation (Signed)
Clinical/Bedside Swallow Evaluation Patient Details  Name: Corey Carr MRN: [id number] Date of Birth: October 22, 1940  Today's Date: 05/10/2019 Time: SLP Start Time (ACUTE ONLY): 2774 SLP Stop Time (ACUTE ONLY): 1324 SLP Time Calculation (min) (ACUTE ONLY): 39 min  Past Medical History: History reviewed. No pertinent past medical history. Past Surgical History:  Past Surgical History:  Procedure Laterality Date  . KNEE SURGERY Right 1957   HPI: Per history and Physical: Corey Carr  is a 78 y.o. cachectic Caucasian male with a known history of hypertension, asthma, seasonal allergy and vitamin B12 deficiency, who presented to the emergency room with acute onset of weight loss.  Been pounds last week and today he weighed 104.28.  6 months ago he weighed 145 pounds and therefore he lost about close to 41 pounds in the last 6 months.  He denied any nausea or vomiting however has been having loose bowel movements over the last 3 to 4 weeks.  He admits to dyspnea and cough without wheezing.  He quit smoking cigarettes about 5 years ago.  He has been having dry mouth.  No dysuria, oliguria or hematuria or flank pain.  He lives with his wife who is on hemodialysis.  He has been recently taking Tylenol for back pain.  No night sweats or fever.  No headache or dizziness or blurred vision, paresthesias or focal muscle weakness     Assessment / Plan / Recommendation    Clinical Impression   Bedside swallow eval revealed only moderate oral phases deficits secondary to no dentition. No overt s/s of aspiration with any tested consistency. Pt has positive COVID test and therefore all isolation precautions taken for BSE.Oral mech exam revealed structures to be functioning without weakness. Vocal quality remained clear, laryngeal elevation appeared adequate. Given solid foods, pt needed extended time to masticate and propel for a bolus. Moderate oral residue after the swallow which cleared with follow up swallows  of liquid. Pt reports he wears his dentures at home for meals. Rec downgrade diet to dysphagia 2 with extra gravies and sauces to be sent on all trays. Pt has a liquid supplement which he drank with ST present. ST to follow up with toleration of diet through Nsg.  SLP Visit Diagnosis: Dysphagia, oropharyngeal phase (R13.12)    Aspiration Risk  Mild aspiration risk    Diet Recommendation Dysphagia 2 (Fine chop)   Liquid Administration via: Straw Medication Administration: Whole meds with liquid Postural Changes: Seated upright at 90 degrees;Remain upright for at least 30 minutes after po intake    Other  Recommendations     Follow up Recommendations   F/U with toleration of diet through Nsg.     Frequency and Duration Other (Comment)          Prognosis   Good for toleration of diet     Swallow Study   General Date of Onset: 05/09/19 Type of Study: Bedside Swallow Evaluation Diet Prior to this Study: Dysphagia 3 (soft) Temperature Spikes Noted: No Respiratory Status: Room air History of Recent Intubation: No Behavior/Cognition: Alert;Cooperative;Pleasant mood Oral Cavity Assessment: Within Functional Limits Oral Cavity - Dentition: Dentures, not available Self-Feeding Abilities: Able to feed self Patient Positioning: Upright in bed Baseline Vocal Quality: Normal Volitional Cough: Strong Volitional Swallow: Able to elicit    Oral/Motor/Sensory Function Overall Oral Motor/Sensory Function: Within functional limits   Ice Chips Ice chips: Within functional limits Presentation: Spoon   Thin Liquid Thin Liquid: Within functional limits Presentation: Cup;Straw    Nectar  Thick Nectar Thick Liquid: Not tested   Honey Thick Honey Thick Liquid: Not tested   Puree Puree: Within functional limits Presentation: Self Fed   Solid     Solid: Impaired(needed extended time to chew. No teeth) Oral Phase Impairments: Impaired mastication Oral Phase Functional Implications: Oral  residue      Lucila Maine 05/10/2019,1:27 PM

## 2019-05-10 NOTE — Progress Notes (Signed)
Consult placed to restart new peripheral iv; pt + Covid; is on Remdesivir;  Both arms assessed for new iv site; poor veins; suggest picc;  Charge RN aware;

## 2019-05-11 LAB — FIBRIN DERIVATIVES D-DIMER (ARMC ONLY): Fibrin derivatives D-dimer (ARMC): 1073.88 ng/mL (FEU) — ABNORMAL HIGH (ref 0.00–499.00)

## 2019-05-11 LAB — CBC WITH DIFFERENTIAL/PLATELET
Abs Immature Granulocytes: 0.07 10*3/uL (ref 0.00–0.07)
Basophils Absolute: 0.1 10*3/uL (ref 0.0–0.1)
Basophils Relative: 1 %
Eosinophils Absolute: 0.4 10*3/uL (ref 0.0–0.5)
Eosinophils Relative: 3 %
HCT: 33.2 % — ABNORMAL LOW (ref 39.0–52.0)
Hemoglobin: 11.4 g/dL — ABNORMAL LOW (ref 13.0–17.0)
Immature Granulocytes: 1 %
Lymphocytes Relative: 15 %
Lymphs Abs: 2.1 10*3/uL (ref 0.7–4.0)
MCH: 26.3 pg (ref 26.0–34.0)
MCHC: 34.3 g/dL (ref 30.0–36.0)
MCV: 76.5 fL — ABNORMAL LOW (ref 80.0–100.0)
Monocytes Absolute: 1.4 10*3/uL — ABNORMAL HIGH (ref 0.1–1.0)
Monocytes Relative: 10 %
Neutro Abs: 9.8 10*3/uL — ABNORMAL HIGH (ref 1.7–7.7)
Neutrophils Relative %: 70 %
Platelets: 466 10*3/uL — ABNORMAL HIGH (ref 150–400)
RBC: 4.34 MIL/uL (ref 4.22–5.81)
RDW: 15.9 % — ABNORMAL HIGH (ref 11.5–15.5)
WBC: 13.9 10*3/uL — ABNORMAL HIGH (ref 4.0–10.5)
nRBC: 0 % (ref 0.0–0.2)

## 2019-05-11 LAB — COMPREHENSIVE METABOLIC PANEL
ALT: 119 U/L — ABNORMAL HIGH (ref 0–44)
AST: 106 U/L — ABNORMAL HIGH (ref 15–41)
Albumin: 3.1 g/dL — ABNORMAL LOW (ref 3.5–5.0)
Alkaline Phosphatase: 77 U/L (ref 38–126)
Anion gap: 10 (ref 5–15)
BUN: 39 mg/dL — ABNORMAL HIGH (ref 8–23)
CO2: 21 mmol/L — ABNORMAL LOW (ref 22–32)
Calcium: 10.1 mg/dL (ref 8.9–10.3)
Chloride: 105 mmol/L (ref 98–111)
Creatinine, Ser: 0.88 mg/dL (ref 0.61–1.24)
GFR calc Af Amer: >60 mL/min (ref >60)
GFR calc non Af Amer: >60 mL/min (ref >60)
Glucose, Bld: 130 mg/dL — ABNORMAL HIGH (ref 70–99)
Potassium: 4.1 mmol/L (ref 3.5–5.1)
Sodium: 136 mmol/L (ref 135–145)
Total Bilirubin: 0.6 mg/dL (ref 0.3–1.2)
Total Protein: 7.1 g/dL (ref 6.5–8.1)

## 2019-05-11 LAB — C-REACTIVE PROTEIN: CRP: 10.8 mg/dL — ABNORMAL HIGH (ref <1.0)

## 2019-05-11 MED ORDER — CHLORHEXIDINE GLUCONATE CLOTH 2 % EX PADS
6.0000 | MEDICATED_PAD | Freq: Every day | CUTANEOUS | Status: DC
  Administered 2019-05-11: 6 via TOPICAL

## 2019-05-11 MED ORDER — SODIUM CHLORIDE 0.9% FLUSH: 10.0000 mL | INTRAVENOUS | Status: DC | PRN

## 2019-05-11 NOTE — Progress Notes (Signed)
PROGRESS NOTE    Corey Carr  HAL:[id number] DOB: 25-Oct-1940 DOA: 05/08/2019  PCP: [name], MD    LOS - 3   Brief Narrative:  78 y.o.cachectic Caucasian malewith a known history ofhypertension, asthma, seasonal allergy and vitamin B12 deficiency, who presented to the emergency room withgeneralized weakness and ongoing recentweight loss.Estimates 41 pounds lost over past 6 months. Reported loose stools for past 3-4 weeks, but no N/V. Also with recent dyspnea and cough, no wheezing. No fever/chills or known sick contacts. In the ED, tachypneic but otherwise stable vitals, no fever or hypoxia. ABG showed 7.38 / 28 / 16.6. Carboxyhemoglobin and methemoglobin within normal. CMP showed hyperkalemia 5.3, BUN 87, creatinine 2.09, anion gap 17. High-sensitive troponin mildly elevated 19. CBC showed leukoctosis 15k (neutrophilia). Two-view chest x-ray showed COPD, chronic volume loss on the left with chronic left lower lung opacity and possible small left pleural effusion.Given 1 L bolus of IV normal salineandadmitted for further evaluation and management.  Patient tested POSITIVE forCovid-19by PCR.  Was recently negative on 12/9.  Not hypoxic and really no respiratory symptoms.  Started on Remdesivir 12/18.  Peripheral IV access has been a challenge, PICC to be placed.  Subjective 12/20: Patient awake sitting up in bed when seen this morning.  He reports feeling fine.  Denies fevers or chills, shortness of breath, chest pain, nausea vomiting or diarrhea.  No acute events reported.  Peripheral IV infiltrated this morning, awaiting PICC line placement.  Assessment & Plan:   Principal Problem:   Failure to thrive (0-17) Active Problems:   AKI (acute kidney injury) ([place])   Dehydration   COVID-19 virus detected   Abnormal CT lung screening   Essential hypertension   Asthma   Unintentional weight loss   Vitamin B12 deficiency   Seasonal allergies   Covid-19 Virus  Detected- not hypoxic, pretty much asymptomatic other than weakness and FTT. --continue remdesivir, started 12/18 --continue, vitamin C, zinc --not hypoxic, hold off on steroids   Failure to thrive- most likely patient has underlying malignancy causing this. Work up pending, as outlined below. --continue gentle IV hydration --dietician consulted --continue Megace --Dietician consult --PT/OT - recommending HHPT --Palliative consult  AKI (acute kidney injury)- Resolved.  Pre-renal in setting of dehydration and poor PO intake. Improving --will continue gentle IV fluids just for maintenance --monitor BMP --renally dose meds as indicated and avoid nephrotoxins  Dehydration- due to poor PO intake - management as above.  Abnormal CT lung screeningand Abnormal Chest Xray on admission- former smoker, most recent screening CT on 03/06/2019 with Lung-RADS 4B, suspiciousdue to multiple abnormalities including soft tissue density throughout entire left lower lobe, diffuse scarring and fibrosis. Admission chest xray consistent with these findings. Patient seen by pulmonology in follow up, has scheduled PET scan on 05/12/19. Need biopsy for tissue diagnosis and further staging to determine any potential therapies. Discussed case with Dr. Lanney Gins, pulmonology. --initially started on Rocephin and Azithromycin for presumed CA-PNA --stop antibiotics as patient without acute respiratory symptoms, fevers, chills. Suspect leukocytosis is reactive. --monitor clinically and will resume antibiotics if needed and would cover for for possible aspiration --given positive COVID-19 test, will need to hold off on biopsy at this time.  Follow up outpatient.  Unintentional Weight Loss - concern for lung malignancy given abnormal CT highly suspicious. --Dietician consulted, appreciate recommendations  Essential hypertension- chronic, stable.  --home losartan/HCTZ held for now due to AKI and  patient has been normotensive --may not need to resume med, monitor BP closely  Asthma- not acutely exacerbated. Home regimen is Trelegy ellipta.  --Amedeo Kinsman and Duonebs ordered  Vitamin B12 deficiency --continue home supplement  Seasonal Allergies --continue home antihistamine   DVT prophylaxis: Heparin   Code Status: Full Code  Family Communication: None at bedside Disposition Plan: Expect DC home in 1 to 2 days, with home health PT. Patient needs ramp at his home, SW involved.  Lives with wife and has been mostly independent prior to recent decline.   Consultants:   Palliative  Procedures:   None  Antimicrobials:   Rocephin and Zithromax 12/17-12/18   Objective: Vitals:   05/10/19 1156 05/10/19 2014 05/10/19 2140 05/11/19 0527  BP: 107/66 99/63 132/84 110/65  Pulse: 89 85 93 89  Resp: 20 18 20 20   Temp: (!) 97.5 F (36.4 C) 97.8 F (36.6 C)  97.6 F (36.4 C)  TempSrc: Oral Oral  Oral  SpO2: 100% 96% 100% 100%  Weight:      Height:        Intake/Output Summary (Last 24 hours) at 05/11/2019 0841 Last data filed at 05/11/2019 2130 Gross per 24 hour  Intake 2043.57 ml  Output 300 ml  Net 1743.57 ml   Filed Weights   05/08/19 1756 05/09/19 0556  Weight: 47.4 kg 47 kg    Examination:  General exam: awake, alert, no acute distress, frail, cachectic, pleasant HEENT: moist mucus membranes, hearing grossly normal  Respiratory system: clear to auscultation bilaterally, no wheezes, rales or rhonchi, normal respiratory effort. Cardiovascular system: normal S1/S2, RRR, no JVD, murmurs, rubs, gallops, no pedal edema.   Central nervous system: alert and oriented x4. no gross focal neurologic deficits, normal speech Extremities: moves all, no edema, normal tone Skin: dry, intact, normal temperature Psychiatry: normal mood, congruent affect, judgement and insight appear normal    Data Reviewed: I have personally reviewed following labs and  imaging studies  CBC: Recent Labs  Lab 05/08/19 1833 05/09/19 0750 05/10/19 0535 05/11/19 0725  WBC 15.0* 14.5* 13.5* 13.9*  NEUTROABS 11.9*  --  9.4* 9.8*  HGB 12.7* 12.7* 11.3* 11.4*  HCT 39.9 37.4* 34.7* 33.2*  MCV 82.4 77.1* 80.9 76.5*  PLT 517* 513* 460* 865*   Basic Metabolic Panel: Recent Labs  Lab 05/08/19 2024 05/09/19 0750 05/10/19 0535 05/11/19 0725  NA 135 137 138 136  K 5.3* 4.7 5.0 4.1  CL 102 106 107 105  CO2 16* 18* 20* 21*  GLUCOSE 118* 84 77 130*  BUN 87* 75* 46* 39*  CREATININE 2.09* 1.54* 0.94 0.88  CALCIUM 12.3* 11.7* 10.8* 10.1  MG 2.4  --  1.8  --   PHOS 4.4  --  2.3*  --    GFR: Estimated Creatinine Clearance: 46 mL/min (by C-G formula based on SCr of 0.88 mg/dL). Liver Function Tests: Recent Labs  Lab 05/08/19 2024 05/10/19 0535 05/11/19 0725  AST 25 34 106*  ALT 20 26 119*  ALKPHOS 98 79 77  BILITOT 0.7 0.6 0.6  PROT 8.8* 7.0 7.1  ALBUMIN 4.1 3.3* 3.1*   No results for input(s): LIPASE, AMYLASE in the last 168 hours. No results for input(s): AMMONIA in the last 168 hours. Coagulation Profile: No results for input(s): INR, PROTIME in the last 168 hours. Cardiac Enzymes: No results for input(s): CKTOTAL, CKMB, CKMBINDEX, TROPONINI in the last 168 hours. BNP (last 3 results) No results for input(s): PROBNP in the last 8760 hours. HbA1C: No results for input(s): HGBA1C in the last 72 hours. CBG: No results for  input(s): GLUCAP in the last 168 hours. Lipid Profile: No results for input(s): CHOL, HDL, LDLCALC, TRIG, CHOLHDL, LDLDIRECT in the last 72 hours. Thyroid Function Tests: No results for input(s): TSH, T4TOTAL, FREET4, T3FREE, THYROIDAB in the last 72 hours. Anemia Panel: Recent Labs    05/09/19 1728  FERRITIN 847*   Sepsis Labs: Recent Labs  Lab 05/09/19 1728  PROCALCITON 0.31    Recent Results (from the past 240 hour(s))  SARS CORONAVIRUS 2 (TAT 6-24 HRS) Nasopharyngeal Nasopharyngeal Swab     Status: Abnormal    Collection Time: 05/08/19 10:55 PM   Specimen: Nasopharyngeal Swab  Result Value Ref Range Status   SARS Coronavirus 2 POSITIVE (A) NEGATIVE Final    Comment: RESULT CALLED TO, READ BACK BY AND VERIFIED WITH: F.MILES RN [id number] 05/09/2019 MCCORMICK K (NOTE) SARS-CoV-2 target nucleic acids are DETECTED. The SARS-CoV-2 RNA is generally detectable in upper and lower respiratory specimens during the acute phase of infection. Positive results are indicative of the presence of SARS-CoV-2 RNA. Clinical correlation with patient history and other diagnostic information is  necessary to determine patient infection status. Positive results do not rule out bacterial infection or co-infection with other viruses.  The expected result is Negative. Fact Sheet for Patients: SugarRoll.be Fact Sheet for Healthcare Providers: https://www.woods-mathews.com/ This test is not yet approved or cleared by the [place] FDA and  has been authorized for detection and/or diagnosis of SARS-CoV-2 by FDA under an Emergency Use Authorization (EUA). This EUA will remain  in effect (meaning this test can be used) for  the duration of the COVID-19 declaration under Section 564(b)(1) of the Act, 21 U.S.C. section 360bbb-3(b)(1), unless the authorization is terminated or revoked sooner. Performed at [facility], [place] [address] [place], [place] [address]   CULTURE, BLOOD (ROUTINE X 2) w Reflex to ID Panel     Status: None (Preliminary result)   Collection Time: 05/09/19  7:50 AM   Specimen: BLOOD  Result Value Ref Range Status   Specimen Description BLOOD LH  Final   Special Requests   Final    BOTTLES DRAWN AEROBIC AND ANAEROBIC Blood Culture adequate volume   Culture   Final    NO GROWTH 2 DAYS Performed at [facility], [address], [place], [place] [address]    Report Status PENDING  Incomplete  CULTURE, BLOOD (ROUTINE X 2) w Reflex to ID Panel      Status: None (Preliminary result)   Collection Time: 05/09/19  7:59 AM   Specimen: BLOOD  Result Value Ref Range Status   Specimen Description BLOOD LAC  Final   Special Requests   Final    BOTTLES DRAWN AEROBIC AND ANAEROBIC Blood Culture adequate volume   Culture   Final    NO GROWTH 2 DAYS Performed at [facility], [address], [place], [place] [address]    Report Status PENDING  Incomplete         Radiology Studies: [place] EKG SITE RITE  Result Date: 05/10/2019 If Site Rite image not attached, placement could not be confirmed due to current cardiac rhythm.       Scheduled Meds: . vitamin C  500 mg Oral Daily  . cholecalciferol  1,000 Units Oral Daily  . feeding supplement (ENSURE ENLIVE)  237 mL Oral TID BM  . fluticasone  2 spray Each Nare Daily  . fluticasone furoate-vilanterol  1 puff Inhalation Daily   And  . umeclidinium bromide  1 puff Inhalation  Daily  . heparin injection (subcutaneous)  5,000 Units Subcutaneous Q8H  . loratadine  10 mg Oral Daily  . megestrol  400 mg Oral Daily  . multivitamin with minerals  1 tablet Oral Daily  . omega-3 acid ethyl esters  1 g Oral Daily  . vitamin B-12  1,000 mcg Oral Daily  . vitamin E  400 Units Oral Daily  . zinc sulfate  220 mg Oral Daily   Continuous Infusions: . sodium chloride 50 mL/hr at 05/11/19 0648  . remdesivir 100 mg in NS 100 mL Stopped (05/10/19 2034)     LOS: 3 days    Time spent: 25-30 minutes    Ezekiel Slocumb, DO [facility]   If 7PM-7AM, please contact night-coverage [email or web address] Password [facility] 05/11/2019, 8:41 AM

## 2019-05-11 NOTE — Progress Notes (Signed)
Peripherally Inserted Central Catheter/Midline Placement  The IV Nurse has discussed with the patient and/or persons authorized to consent for the patient, the purpose of this procedure and the potential benefits and risks involved with this procedure.  The benefits include less needle sticks, lab draws from the catheter, and the patient may be discharged home with the catheter. Risks include, but not limited to, infection, bleeding, blood clot (thrombus formation), and puncture of an artery; nerve damage and irregular heartbeat and possibility to perform a PICC exchange if needed/ordered by physician.  Alternatives to this procedure were also discussed.  Bard Power PICC patient education guide, fact sheet on infection prevention and patient information card has been provided to patient /or left at bedside.    PICC/Midline Placement Documentation  PICC Double Lumen 09/60/45 PICC Right Basilic 39 cm 0 cm (Active)  Indication for Insertion or Continuance of Line Poor Vasculature-patient has had multiple peripheral attempts or PIVs lasting less than 24 hours 05/11/19 1655  Exposed Catheter (cm) 0 cm 05/11/19 1655  Site Assessment Clean;Dry;Intact 05/11/19 1655  Lumen #1 Status Flushed;Blood return noted;Saline locked 05/11/19 1655  Lumen #2 Status Flushed;Blood return noted;Saline locked 05/11/19 1655  Dressing Type Transparent 05/11/19 1655  Dressing Status Clean;Intact;Antimicrobial disc in place;Dry 05/11/19 1655  Dressing Change Due 05/18/19 05/11/19 1655       Corey Carr 05/11/2019, 4:58 PM

## 2019-05-11 NOTE — Progress Notes (Addendum)
Spoke with Jodie  RN re PICC order.   Has PIV currently sufficient for IV meds ordered.  Leah RN to assess if PICC line still needed.

## 2019-05-11 NOTE — Progress Notes (Signed)
Jodie RN states pt does need PICC line.

## 2019-05-11 NOTE — Progress Notes (Signed)
Patient's cardiac monitoring order expired, asked Dr. Arbutus Ped if she wanted to continue or not. Dr. Arbutus Ped ordered to discontinue cardiac monitor.

## 2019-05-11 NOTE — Progress Notes (Signed)
Patient's IV infiltrated, notified Dr. Arbutus Ped and asked if she wanted to go ahead with the PICC line. Dr. Arbutus Ped ordered to go ahead with PICC.

## 2019-05-12 ENCOUNTER — Encounter: Admission: RE | Admit: 2019-05-12 | Payer: Medicare Other | Source: Ambulatory Visit

## 2019-05-12 DIAGNOSIS — Z515 Encounter for palliative care: Secondary | ICD-10-CM

## 2019-05-12 DIAGNOSIS — Z7189 Other specified counseling: Secondary | ICD-10-CM

## 2019-05-12 LAB — COMPREHENSIVE METABOLIC PANEL
ALT: 115 U/L — ABNORMAL HIGH (ref 0–44)
AST: 68 U/L — ABNORMAL HIGH (ref 15–41)
Albumin: 2.9 g/dL — ABNORMAL LOW (ref 3.5–5.0)
Alkaline Phosphatase: 68 U/L (ref 38–126)
Anion gap: 7 (ref 5–15)
BUN: 33 mg/dL — ABNORMAL HIGH (ref 8–23)
CO2: 25 mmol/L (ref 22–32)
Calcium: 9.8 mg/dL (ref 8.9–10.3)
Chloride: 104 mmol/L (ref 98–111)
Creatinine, Ser: 0.66 mg/dL (ref 0.61–1.24)
GFR calc Af Amer: >60 mL/min (ref >60)
GFR calc non Af Amer: >60 mL/min (ref >60)
Glucose, Bld: 88 mg/dL (ref 70–99)
Potassium: 4 mmol/L (ref 3.5–5.1)
Sodium: 136 mmol/L (ref 135–145)
Total Bilirubin: 0.7 mg/dL (ref 0.3–1.2)
Total Protein: 6.3 g/dL — ABNORMAL LOW (ref 6.5–8.1)

## 2019-05-12 LAB — CBC WITH DIFFERENTIAL/PLATELET
Abs Immature Granulocytes: 0.05 10*3/uL (ref 0.00–0.07)
Basophils Absolute: 0.1 10*3/uL (ref 0.0–0.1)
Basophils Relative: 1 %
Eosinophils Absolute: 0.5 10*3/uL (ref 0.0–0.5)
Eosinophils Relative: 4 %
HCT: 32 % — ABNORMAL LOW (ref 39.0–52.0)
Hemoglobin: 10.5 g/dL — ABNORMAL LOW (ref 13.0–17.0)
Immature Granulocytes: 0 %
Lymphocytes Relative: 19 %
Lymphs Abs: 2.4 10*3/uL (ref 0.7–4.0)
MCH: 26.1 pg (ref 26.0–34.0)
MCHC: 32.8 g/dL (ref 30.0–36.0)
MCV: 79.4 fL — ABNORMAL LOW (ref 80.0–100.0)
Monocytes Absolute: 1.6 10*3/uL — ABNORMAL HIGH (ref 0.1–1.0)
Monocytes Relative: 12 %
Neutro Abs: 8.3 10*3/uL — ABNORMAL HIGH (ref 1.7–7.7)
Neutrophils Relative %: 64 %
Platelets: 434 10*3/uL — ABNORMAL HIGH (ref 150–400)
RBC: 4.03 MIL/uL — ABNORMAL LOW (ref 4.22–5.81)
RDW: 15.9 % — ABNORMAL HIGH (ref 11.5–15.5)
WBC: 12.9 10*3/uL — ABNORMAL HIGH (ref 4.0–10.5)
nRBC: 0 % (ref 0.0–0.2)

## 2019-05-12 LAB — FIBRIN DERIVATIVES D-DIMER (ARMC ONLY): Fibrin derivatives D-dimer (ARMC): 977.28 ng/mL (FEU) — ABNORMAL HIGH (ref 0.00–499.00)

## 2019-05-12 LAB — C-REACTIVE PROTEIN: CRP: 10.3 mg/dL — ABNORMAL HIGH (ref <1.0)

## 2019-05-12 MED ORDER — ADULT MULTIVITAMIN W/MINERALS CH: 1.0000 | ORAL_TABLET | Freq: Every day | ORAL | 1 refills | Status: DC

## 2019-05-12 MED ORDER — ENSURE ENLIVE PO LIQD: 237.0000 mL | Freq: Three times a day (TID) | ORAL | 12 refills | Status: DC

## 2019-05-12 MED ORDER — LORATADINE 10 MG PO TABS: 10.0000 mg | ORAL_TABLET | Freq: Every day | ORAL | 1 refills | Status: AC

## 2019-05-12 NOTE — Progress Notes (Signed)
Corey Carr and O x4. VSS. Pt tolerating diet well. No complaints of nausea or vomiting. IV removed intact, prescriptions given. Pt voices understanding of discharge instructions with no further questions. Patient discharged via wheelchair with NT  Allergies as of 05/12/2019   Not on File     Medication List    STOP taking these medications   azithromycin 500 MG tablet Commonly known as: ZITHROMAX   losartan-hydrochlorothiazide 100-25 MG tablet Commonly known as: HYZAAR     TAKE these medications   aspirin 325 MG tablet Take 1 tablet by mouth daily.   cholecalciferol 25 MCG (1000 UT) tablet Commonly known as: VITAMIN D3 Take 1,000 Units by mouth daily.   feeding supplement (ENSURE ENLIVE) Liqd Take 237 mLs by mouth 3 (three) times daily between meals.   loratadine 10 MG tablet Commonly known as: Allergy Relief Take 1 tablet (10 mg total) by mouth daily. Start taking on: May 13, 2019 What changed: how much to take   megestrol 40 MG tablet Commonly known as: MEGACE Take 40 mg by mouth daily.   meloxicam 15 MG tablet Commonly known as: MOBIC Take 15 mg by mouth daily as needed.   multivitamin with minerals Tabs tablet Take 1 tablet by mouth daily. Start taking on: May 13, 2019   omega-3 acid ethyl esters 1 g capsule Commonly known as: LOVAZA Take 1 g by mouth daily.   traMADol 50 MG tablet Commonly known as: ULTRAM Take 50 mg by mouth every 6 (six) hours as needed.   Trelegy Ellipta 100-62.5-25 MCG/INH Aepb Generic drug: Fluticasone-Umeclidin-Vilant Take 1 puff by mouth daily.   vitamin B-12 1000 MCG tablet Commonly known as: CYANOCOBALAMIN Take 1 tablet by mouth daily.   vitamin C 250 MG tablet Commonly known as: ASCORBIC ACID Take 250 mg by mouth daily.   vitamin E 400 UNIT capsule Generic drug: vitamin E Take 400 Units by mouth daily.       Vitals:   05/12/19 0525 05/12/19 1228  BP: 117/64 105/70  Pulse: 87 88  Resp: (!) 24 20   Temp: 98.2 F (36.8 C) 98.4 F (36.9 C)  SpO2: 100% 100%    Darnelle Catalan

## 2019-05-12 NOTE — TOC Transition Note (Signed)
Transition of Care [facility]) - CM/SW Discharge Note   Patient Details  Name: Corey Carr MRN: [id number] Date of Birth: 01-05-1941  Transition of Care [facility]) CM/SW Contact:  Candie Chroman, LCSW Phone Number: 05/12/2019, 12:21 PM   Clinical Narrative: Patient has orders to discharge home today. Home health orders are in. [facility] is aware. Added their contact information to the AVS. No further concerns. CSW signing off.    Final next level of care: [place] Barriers to Discharge: Barriers Resolved   Patient Goals and CMS Choice Patient states their goals for this hospitalization and ongoing recovery are:: go home      Discharge Placement                    Patient and family notified of of transfer: 05/12/19  Discharge Plan and Services   Discharge Planning Services: CM Consult            DME Arranged: N/A         HH Arranged: PT, OT [place] Agency: [place] Date [place]: 05/12/19 Time [place]: [id number] Representative spoke with at [place]: [place] ([place]) Interventions     Readmission Risk Interventions No flowsheet data found.

## 2019-05-12 NOTE — Consult Note (Addendum)
Consultation Note Date: 05/12/2019   Patient Name: Corey Carr  DOB: 30-May-1940  MRN: [id number]  Age / Sex: 78 y.o., male  PCP: [name], MD Referring Physician: Ezekiel Slocumb, DO  Reason for Consultation: Establishing goals of care  HPI/Patient Profile: Corey Carr  is a 78 y.o. cachectic Caucasian male with a known history of hypertension, asthma, seasonal allergy and vitamin B12 deficiency, who presented to the emergency room with acute onset of weight loss.  Clinical Assessment and Goals of Care: Patient is on isolation for COVID. Spoke with him via phone. He states he is happy to be going home today. He lives with his wife and has been married 40 years. He has 1 son by his first wife, and a stepson with his current wife.   Functionally, prior to this admission he used no assistive devices. He drives. He states he has trilogy at night, but no O2 during the day. He states he was put on the trilogy about a week ago.    We discussed his diagnoses and GOC.  A detailed discussion was had today regarding advanced directives.  Concepts specific to code status, artifical feeding and hydration, IV antibiotics and rehospitalization were discussed.  The difference between an aggressive medical intervention path and a comfort care path was discussed.  Values and goals of care important to patient and family were attempted to be elicited.  Discussed limitations of medical interventions to prolong quality of life in some situations and discussed the concept of human mortality.  He states he plans to follow up with oncology as needed. He states he would like whatever care is possible. He states he wants to live as long as he can. He cannot think of a circumstance that would make him want to shift to comfort based care.    SUMMARY OF RECOMMENDATIONS   Recommend palliative at D/C.   Prognosis:    Unable to determine  Discharge Planning: Home with Palliative Services      Primary Diagnoses: Present on Admission: . Failure to thrive (0-17) . AKI (acute kidney injury) ([place]) . Abnormal CT lung screening . Essential hypertension . Asthma . Vitamin B12 deficiency . Unintentional weight loss . Dehydration   I have reviewed the medical record, interviewed the patient and family, and examined the patient. The following aspects are pertinent.  History reviewed. No pertinent past medical history. Social History   Socioeconomic History  . Marital status: Married    Spouse name: Not on file  . Number of children: Not on file  . Years of education: Not on file  . Highest education level: Not on file  Occupational History  . Not on file  Tobacco Use  . Smoking status: Former Smoker    Packs/day: 0.50    Years: 60.00    Pack years: 30.00    Types: Cigarettes    Quit date: 2015    Years since quitting: 5.9  . Smokeless tobacco: Never Used  Substance and Sexual Activity  . Alcohol use: No  .  Drug use: No  . Sexual activity: Not on file  Other Topics Concern  . Not on file  Social History Narrative  . Not on file   Social Determinants of Health   Financial Resource Strain:   . Difficulty of Paying Living Expenses: Not on file  Food Insecurity:   . Worried About Charity fundraiser in the Last Year: Not on file  . Ran Out of Food in the Last Year: Not on file  Transportation Needs:   . Lack of Transportation (Medical): Not on file  . Lack of Transportation (Non-Medical): Not on file  Physical Activity:   . Days of Exercise per Week: Not on file  . Minutes of Exercise per Session: Not on file  Stress:   . Feeling of Stress : Not on file  Social Connections:   . Frequency of Communication with Friends and Family: Not on file  . Frequency of Social Gatherings with Friends and Family: Not on file  . Attends Religious Services: Not on file  . Active Member of  Clubs or Organizations: Not on file  . Attends Archivist Meetings: Not on file  . Marital Status: Not on file   History reviewed. No pertinent family history. Scheduled Meds: . vitamin C  500 mg Oral Daily  . Chlorhexidine Gluconate Cloth  6 each Topical Daily  . cholecalciferol  1,000 Units Oral Daily  . feeding supplement (ENSURE ENLIVE)  237 mL Oral TID BM  . fluticasone  2 spray Each Nare Daily  . fluticasone furoate-vilanterol  1 puff Inhalation Daily   And  . umeclidinium bromide  1 puff Inhalation Daily  . heparin injection (subcutaneous)  5,000 Units Subcutaneous Q8H  . loratadine  10 mg Oral Daily  . megestrol  400 mg Oral Daily  . multivitamin with minerals  1 tablet Oral Daily  . omega-3 acid ethyl esters  1 g Oral Daily  . vitamin B-12  1,000 mcg Oral Daily  . vitamin E  400 Units Oral Daily  . zinc sulfate  220 mg Oral Daily   Continuous Infusions: . sodium chloride Stopped (05/11/19 1030)   PRN Meds:.acetaminophen **OR** acetaminophen, ipratropium-albuterol, magnesium hydroxide, morphine injection, sodium chloride flush, traMADol, traZODone Medications Prior to Admission:  Prior to Admission medications   Medication Sig Start Date End Date Taking? Authorizing Provider  aspirin 325 MG tablet Take 1 tablet by mouth daily.   Yes [provider]  azithromycin (ZITHROMAX) 500 MG tablet Take 500 mg by mouth daily. 05/02/19  Yes [provider]  cholecalciferol (VITAMIN D3) 25 MCG (1000 UT) tablet Take 1,000 Units by mouth daily.   Yes [provider]  loratadine (ALLERGY RELIEF) 10 MG tablet Take 20 mg by mouth daily.   Yes [provider]  losartan-hydrochlorothiazide (HYZAAR) 100-25 MG tablet Take 1 tablet by mouth daily. 04/22/19  Yes [provider]  megestrol (MEGACE) 40 MG tablet Take 40 mg by mouth daily. 05/02/19  Yes [provider]  meloxicam (MOBIC) 15 MG tablet Take 15 mg by mouth daily as needed.  04/22/19  Yes [provider]  omega-3 acid ethyl esters (LOVAZA) 1 g capsule Take 1 g by mouth daily.   Yes [provider]  traMADol (ULTRAM) 50 MG tablet Take 50 mg by mouth every 6 (six) hours as needed. 04/30/19  Yes [provider]  TRELEGY ELLIPTA 100-62.5-25 MCG/INH AEPB Take 1 puff by mouth daily. 05/01/19  Yes [provider]  vitamin  B-12 (CYANOCOBALAMIN) 1000 MCG tablet Take 1 tablet by mouth daily.   Yes [provider]  vitamin C (ASCORBIC ACID) 250 MG tablet Take 250 mg by mouth daily.   Yes [provider]  vitamin E (VITAMIN E) 400 UNIT capsule Take 400 Units by mouth daily.   Yes [provider]  feeding supplement, ENSURE ENLIVE, (ENSURE ENLIVE) LIQD Take 237 mLs by mouth 3 (three) times daily between meals. 05/12/19   Nicole Kindred A, DO  loratadine (ALLERGY RELIEF) 10 MG tablet Take 1 tablet (10 mg total) by mouth daily. 05/13/19   Ezekiel Slocumb, DO  Multiple Vitamin (MULTIVITAMIN WITH MINERALS) TABS tablet Take 1 tablet by mouth daily. 05/13/19   Ezekiel Slocumb, DO   Not on File Review of Systems  All other systems reviewed and are negative.    Vital Signs: BP 105/70 (BP Location: Left Arm)   Pulse 88   Temp 98.4 F (36.9 C) (Oral)   Resp 20   Ht 5\' 8"  (1.727 m)   Wt 47 kg   SpO2 100%   BMI 15.75 kg/m  Pain Scale: 0-10 POSS *See Group Information*: 1-Acceptable,Awake and alert Pain Score: 0-No pain   SpO2: SpO2: 100 % O2 Device:SpO2: 100 % O2 Flow Rate: .   IO: Intake/output summary:   Intake/Output Summary (Last 24 hours) at 05/12/2019 1314 Last data filed at 05/12/2019 1023 Gross per 24 hour  Intake --  Output 625 ml  Net -625 ml    LBM: Last BM Date: 05/11/19 Baseline Weight: Weight: 47.4 kg Most recent weight: Weight: 47 kg     Palliative Assessment/Data: 70%    COVID-19 DISASTER DECLARATION:    PHYSICAL EXAMINATION WAS NOT POSSIBLE DUE TO TREATMENT OF COVID-19   AND CONSERVATION OF PERSONAL PROTECTIVE EQUIPMENT.  Patient assessed or the symptoms described in the history of present illness.  In the context of the Global COVID-19 pandemic, which necessitated consideration that the patient has SARS-CoV-2, the virus that causes COVID-19, Institutional protocols and algorithms that pertain to the evaluation of patients at risk for COVID-19 are in a state of rapid change based on information released by regulatory bodies including the CDC and federal and state organizations. These policies and algorithms were followed during the patient's care while in hospital.  Time In: 12:20 Time Out: 1:00 Time Total: 35 min Greater than 50%  of this time was spent counseling and coordinating care related to the above assessment and plan.  Signed by: Asencion Gowda, NP   Please contact Palliative Medicine Team phone at [phone] for questions and concerns.  For individual provider: See Shea Evans

## 2019-05-12 NOTE — Discharge Summary (Signed)
Physician Discharge Summary  Corey Carr DGU:[id number] DOB: 09/10/1940 DOA: 05/08/2019  PCP: [name], MD  Admit date: 05/08/2019 Discharge date: 05/12/2019  Admitted From: Home Disposition:  Home  Recommendations for Outpatient Follow-up:  1. Follow up with PCP in 1-2 weeks 2. Please obtain BMP/CBC in one week 3. Please follow up with pulmonology in 1-2 weeks  Home Health: Yes  Equipment/Devices: None   Discharge Condition: Stable  CODE STATUS: Full  Diet recommendation: Regular, Ensure Enlive po TID, Magic cup TID with meals  Brief/Interim Summary:  78 y.o.cachectic Caucasian malewith a known history ofhypertension, asthma, seasonal allergy and vitamin B12 deficiency, who presented to the emergency room withgeneralized weakness and ongoing recentweight loss.Estimates 41 pounds lost over past 6 months. Reported loose stools for past 3-4 weeks, but no N/V. Also with recent dyspnea and cough, no wheezing. No fever/chills or known sick contacts. In the ED, tachypneic but otherwise stable vitals, no fever or hypoxia. ABG showed 7.38 / 28 / 16.6. Carboxyhemoglobin and methemoglobin within normal. CMP showed hyperkalemia 5.3, BUN 87, creatinine 2.09, anion gap 17. High-sensitive troponin mildly elevated 19. CBC showed leukoctosis 15k (neutrophilia). Two-view chest x-ray showed COPD, chronic volume loss on the left with chronic left lower lung opacity and possible small left pleural effusion.Given 1 L bolus of IV normal salineandadmitted for further evaluation and management.  Patient tested POSITIVE forCovid-19byPCR.Was recently negative on 12/9.Not hypoxic and really no respiratory symptoms. Started on Remdesivir 12/18. Peripheral IV access has been a challenge, PICC to be placed.  LFT's up-trended on Remdesivir.  Given this, his lack of symptoms, and his overall poor health status with probable lung cancer, stopped remdesivir and will discharge patient  home.  Advised patient and his wife about precautions at home, masks when near each other, handwashing, cleaning shared surfaces.  Patient is to follow up closely with pulmonology for further evaluation and likely lung biopsy in the near future.  To be followed by COVID-19 monitoring.  Patient is agreeable with plan and expressed understanding about precautions and follow-up.   Discharge Diagnoses: Principal Problem:   Failure to thrive (0-17) Active Problems:   AKI (acute kidney injury) ([place])   Dehydration   COVID-19 virus detected   Abnormal CT lung screening   Essential hypertension   Asthma   Unintentional weight loss   Vitamin B12 deficiency   Seasonal allergies  Covid-19 Virus Detected- not hypoxic, pretty much asymptomatic other than weakness and FTT. --given emdesivir,12/18-12/20, but ALT trended up, stopped --treated with vitamin C, zinc --not hypoxic, nosteroids given as he is likely already immunocompromised  Failure to thrive- most likely patient has underlying malignancy causing this. Work up pending, as outlined below. --continue gentle IV hydration --dietician consulted, supplemental drinks added --continue Megace --PT/OT- recommending HHPT --Palliative consult  AKI (acute kidney injury)-Resolved. Pre-renal in setting of dehydration and poor PO intake. Improving --monitor BMP --renally dose meds as indicated and avoid nephrotoxins  Dehydration- due to poor PO intake - management as above.  Abnormal CT lung screeningand Abnormal Chest Xray on admission- former smoker, most recent screening CT on 03/06/2019 with Lung-RADS 4B, suspiciousdue to multiple abnormalities including soft tissue density throughout entire left lower lobe, diffuse scarring and fibrosis. Admission chest xray consistent with these findings. Patient seen by pulmonology in follow up, has scheduled PET scan on 05/12/19.Need biopsy for tissue diagnosis and further staging to  determine any potential therapies. Discussed case with Dr. Lanney Gins, pulmonology. --initially started on Rocephin and Azithromycin for presumed CA-PNA --stop antibiotics  as patient without acute respiratory symptoms, fevers, chills. Suspect leukocytosis is reactive. --monitor clinically and will resume antibiotics if needed and would cover for for possible aspiration --given positive COVID-19 test, will need to hold off on biopsy at this time. Follow up outpatient.  Unintentional Weight Loss - concern for lung malignancy given abnormal CT highly suspicious. --Dietician consulted, appreciate recommendations  Essential hypertension- chronic, stable.  --home losartan/HCTZ held for now due to AKI and patient has been normotensive --may not need to resume med, monitor BP closely  Asthma- not acutely exacerbated. Home regimen is Trelegy ellipta.  --Amedeo Kinsman and Duonebs ordered  Vitamin B12 deficiency --continue home supplement  Seasonal Allergies --continue home antihistamine  Discharge Instructions   Discharge Instructions    Call MD for:  extreme fatigue   Complete by: As directed    Call MD for:  temperature >100.4   Complete by: As directed    Diet - low sodium heart healthy   Complete by: As directed    Discharge instructions   Complete by: As directed    Call your primary care doctor or come to the Emergency Room if you have worsening shortness of breath, fevers or chills.  You tested positive for the virus that causes COVID-19, but have not had symptoms.   You will still need to self-isolate at home until January 2nd.  If you are in same room as other people in your household, please wear a mask.  You and your wife should both wash hands frequently and clean surfaces in the house often (counter tops, door knobs, light switches, etc.)   Increase activity slowly   Complete by: As directed      Allergies as of 05/12/2019   Not on File     Medication  List    STOP taking these medications   azithromycin 500 MG tablet Commonly known as: ZITHROMAX   losartan-hydrochlorothiazide 100-25 MG tablet Commonly known as: HYZAAR     TAKE these medications   aspirin 325 MG tablet Take 1 tablet by mouth daily.   cholecalciferol 25 MCG (1000 UT) tablet Commonly known as: VITAMIN D3 Take 1,000 Units by mouth daily.   feeding supplement (ENSURE ENLIVE) Liqd Take 237 mLs by mouth 3 (three) times daily between meals.   loratadine 10 MG tablet Commonly known as: Allergy Relief Take 1 tablet (10 mg total) by mouth daily. Start taking on: May 13, 2019 What changed: how much to take   megestrol 40 MG tablet Commonly known as: MEGACE Take 40 mg by mouth daily.   meloxicam 15 MG tablet Commonly known as: MOBIC Take 15 mg by mouth daily as needed.   multivitamin with minerals Tabs tablet Take 1 tablet by mouth daily. Start taking on: May 13, 2019   omega-3 acid ethyl esters 1 g capsule Commonly known as: LOVAZA Take 1 g by mouth daily.   traMADol 50 MG tablet Commonly known as: ULTRAM Take 50 mg by mouth every 6 (six) hours as needed.   Trelegy Ellipta 100-62.5-25 MCG/INH Aepb Generic drug: Fluticasone-Umeclidin-Vilant Take 1 puff by mouth daily.   vitamin B-12 1000 MCG tablet Commonly known as: CYANOCOBALAMIN Take 1 tablet by mouth daily.   vitamin C 250 MG tablet Commonly known as: ASCORBIC ACID Take 250 mg by mouth daily.   vitamin E 400 UNIT capsule Generic drug: vitamin E Take 400 Units by mouth daily.      Follow-up Information    Ottie Glazier, MD Follow up in 1  week(s).   Specialty: Pulmonary Disease Contact information: [place] [address] [phone]          Not on File  Consultations:  Palliative Care    Procedures/Studies: DG Chest 2 View  Result Date: 05/08/2019 CLINICAL DATA:  Weakness EXAM: CHEST - 2 VIEW COMPARISON:  CT 03/06/2019 FINDINGS: There is  hyperinflation of the lungs compatible with COPD. Continued opacification in the left lower lung is stable since prior CT. No confluent opacity on the right. Possible small left effusion. Volume loss on the left with shift of mediastinal structures to the left. Heart is normal size. No acute bony abnormality. IMPRESSION: COPD. Chronic volume loss on the left with chronic left lower lung opacity and possible small left effusion. Electronically Signed   By: Rolm Baptise M.D.   On: 05/08/2019 18:45   [place] CHEST (PLEURAL EFFUSION)  Result Date: 05/02/2019 CLINICAL DATA:  Please perform chest ultrasound ultrasound-guided thoracentesis as indicated. EXAM: CHEST ULTRASOUND COMPARISON:  Chest radiograph-04/30/2019; chest CT-03/04/2019 FINDINGS: Sonographic evaluation of the left and right chest is negative for any significant pleural fluid. No thoracentesis attempted. IMPRESSION: No significant left-sided pleural fluid. No thoracentesis attempted. Findings on preceding chest radiograph likely correlate with consolidative opacities, volume loss and architectural distortion demonstrated on chest CT performed 03/06/2019. Electronically Signed   By: Sandi Mariscal M.D.   On: 05/02/2019 12:55   US RENAL  Result Date: 05/09/2019 CLINICAL DATA:  Acute renal injury. EXAM: RENAL / URINARY TRACT ULTRASOUND COMPLETE COMPARISON:  None. FINDINGS: Right Kidney: Renal measurements: 9.3 x 4.4 x 3.9 cm = volume: 84 mL. The right kidney is isoechoic to the index organ, the liver. No mass lesion or stone is present. There is no hydronephrosis. Left Kidney: Renal measurements: 8.6 x 4.7 x 4.7 cm = volume: 100 mL. The left kidney is isoechoic to the index organ, the spleen. No mass lesion or stone is present. There is no hydronephrosis. Bladder: Diffuse bladder wall thickening is noted. No discrete mass lesion is present. Diverticula are noted. Other: The prostate is enlarged, measuring up to 5.2 cm in transverse diameter IMPRESSION: 1.  Kidneys are mildly echogenic bilaterally. This is nonspecific, but can be seen in the setting of medical renal disease. 2. No urinary tract obstruction. 3. Diffuse bladder wall thickening. Question infection or cystitis. No discrete mass lesion is present. 4. Prostate hypertrophy. Electronically Signed   By: San Morelle M.D.   On: 05/09/2019 07:48   [place] EKG SITE RITE  Result Date: 05/10/2019 If Site Rite image not attached, placement could not be confirmed due to current cardiac rhythm.      Subjective: Patient awake sitting up in bed this AM.  No acute events reported.  Patient reports feeling fine.  Denies fever, chills, cough, congestion, chest pain or shortness of breath.  No N/V/D as well.  Agrees with plan to discharge home today and monitor for any symptoms.   Discharge Exam: Vitals:   05/11/19 2235 05/12/19 0525  BP: 111/63 117/64  Pulse: 89 87  Resp: (!) 24 (!) 24  Temp: 97.7 F (36.5 C) 98.2 F (36.8 C)  SpO2: 100% 100%   Vitals:   05/11/19 0527 05/11/19 1239 05/11/19 2235 05/12/19 0525  BP: 110/65 122/73 111/63 117/64  Pulse: 89 85 89 87  Resp: 20 18 (!) 24 (!) 24  Temp: 97.6 F (36.4 C) 97.8 F (36.6 C) 97.7 F (36.5 C) 98.2 F (36.8 C)  TempSrc: Oral Oral Oral Oral  SpO2: 100% 100% 100% 100%  Weight:      Height:        General: Pt is alert, awake, not in acute distress, cachectic, pleasant Cardiovascular: RRR, S1/S2 +, no rubs, no gallops Respiratory: CTA bilaterally, no wheezing, no rhonchi Abdominal: Soft, NT, ND, bowel sounds + Extremities: no edema, no cyanosis    The results of significant diagnostics from this hospitalization (including imaging, microbiology, ancillary and laboratory) are listed below for reference.     Microbiology: Recent Results (from the past 240 hour(s))  SARS CORONAVIRUS 2 (TAT 6-24 HRS) Nasopharyngeal Nasopharyngeal Swab     Status: Abnormal   Collection Time: 05/08/19 10:55 PM   Specimen: Nasopharyngeal Swab   Result Value Ref Range Status   SARS Coronavirus 2 POSITIVE (A) NEGATIVE Final    Comment: RESULT CALLED TO, READ BACK BY AND VERIFIED WITH: F.MILES RN [id number] 05/09/2019 MCCORMICK K (NOTE) SARS-CoV-2 target nucleic acids are DETECTED. The SARS-CoV-2 RNA is generally detectable in upper and lower respiratory specimens during the acute phase of infection. Positive results are indicative of the presence of SARS-CoV-2 RNA. Clinical correlation with patient history and other diagnostic information is  necessary to determine patient infection status. Positive results do not rule out bacterial infection or co-infection with other viruses.  The expected result is Negative. Fact Sheet for Patients: SugarRoll.be Fact Sheet for Healthcare Providers: https://www.woods-mathews.com/ This test is not yet approved or cleared by the [place] FDA and  has been authorized for detection and/or diagnosis of SARS-CoV-2 by FDA under an Emergency Use Authorization (EUA). This EUA will remain  in effect (meaning this test can be used) for  the duration of the COVID-19 declaration under Section 564(b)(1) of the Act, 21 U.S.C. section 360bbb-3(b)(1), unless the authorization is terminated or revoked sooner. Performed at [facility], [place] [address], [place], [place] [address]   CULTURE, BLOOD (ROUTINE X 2) w Reflex to ID Panel     Status: None (Preliminary result)   Collection Time: 05/09/19  7:50 AM   Specimen: BLOOD  Result Value Ref Range Status   Specimen Description BLOOD LH  Final   Special Requests   Final    BOTTLES DRAWN AEROBIC AND ANAEROBIC Blood Culture adequate volume   Culture   Final    NO GROWTH 3 DAYS Performed at [facility], [address], [place], [place] [address]    Report Status PENDING  Incomplete  CULTURE, BLOOD (ROUTINE X 2) w Reflex to ID Panel     Status: None (Preliminary result)   Collection Time: 05/09/19  7:59  AM   Specimen: BLOOD  Result Value Ref Range Status   Specimen Description BLOOD LAC  Final   Special Requests   Final    BOTTLES DRAWN AEROBIC AND ANAEROBIC Blood Culture adequate volume   Culture   Final    NO GROWTH 3 DAYS Performed at [facility], [address], [place], [place] [address]    Report Status PENDING  Incomplete     Labs: BNP (last 3 results) No results for input(s): BNP in the last 8760 hours. Basic Metabolic Panel: Recent Labs  Lab 05/08/19 2024 05/09/19 0750 05/10/19 0535 05/11/19 0725 05/12/19 0517  NA 135 137 138 136 136  K 5.3* 4.7 5.0 4.1 4.0  CL 102 106 107 105 104  CO2 16* 18* 20* 21* 25  GLUCOSE 118* 84 77 130* 88  BUN 87* 75* 46* 39* 33*  CREATININE 2.09* 1.54* 0.94  0.88 0.66  CALCIUM 12.3* 11.7* 10.8* 10.1 9.8  MG 2.4  --  1.8  --   --   PHOS 4.4  --  2.3*  --   --    Liver Function Tests: Recent Labs  Lab 05/08/19 2024 05/10/19 0535 05/11/19 0725 05/12/19 0517  AST 25 34 106* 68*  ALT 20 26 119* 115*  ALKPHOS 98 79 77 68  BILITOT 0.7 0.6 0.6 0.7  PROT 8.8* 7.0 7.1 6.3*  ALBUMIN 4.1 3.3* 3.1* 2.9*   No results for input(s): LIPASE, AMYLASE in the last 168 hours. No results for input(s): AMMONIA in the last 168 hours. CBC: Recent Labs  Lab 05/08/19 1833 05/09/19 0750 05/10/19 0535 05/11/19 0725 05/12/19 0517  WBC 15.0* 14.5* 13.5* 13.9* 12.9*  NEUTROABS 11.9*  --  9.4* 9.8* 8.3*  HGB 12.7* 12.7* 11.3* 11.4* 10.5*  HCT 39.9 37.4* 34.7* 33.2* 32.0*  MCV 82.4 77.1* 80.9 76.5* 79.4*  PLT 517* 513* 460* 466* 434*   Cardiac Enzymes: No results for input(s): CKTOTAL, CKMB, CKMBINDEX, TROPONINI in the last 168 hours. BNP: Invalid input(s): POCBNP CBG: No results for input(s): GLUCAP in the last 168 hours. D-Dimer No results for input(s): DDIMER in the last 72 hours. Hgb A1c No results for input(s): HGBA1C in the last 72 hours. Lipid Profile No results for input(s): CHOL, HDL, LDLCALC, TRIG, CHOLHDL, LDLDIRECT  in the last 72 hours. Thyroid function studies No results for input(s): TSH, T4TOTAL, T3FREE, THYROIDAB in the last 72 hours.  Invalid input(s): FREET3 Anemia work up Recent Labs    05/09/19 1728  FERRITIN 847*   Urinalysis    Component Value Date/Time   COLORURINE YELLOW (A) 05/10/2019 1158   APPEARANCEUR CLEAR (A) 05/10/2019 1158   APPEARANCEUR Clear 11/14/2016 1538   LABSPEC 1.017 05/10/2019 1158   PHURINE 5.0 05/10/2019 1158   GLUCOSEU 50 (A) 05/10/2019 1158   HGBUR NEGATIVE 05/10/2019 1158   BILIRUBINUR NEGATIVE 05/10/2019 1158   BILIRUBINUR Negative 11/14/2016 1538   KETONESUR NEGATIVE 05/10/2019 1158   PROTEINUR NEGATIVE 05/10/2019 1158   NITRITE NEGATIVE 05/10/2019 1158   LEUKOCYTESUR NEGATIVE 05/10/2019 1158   Sepsis Labs Invalid input(s): PROCALCITONIN,  WBC,  LACTICIDVEN Microbiology Recent Results (from the past 240 hour(s))  SARS CORONAVIRUS 2 (TAT 6-24 HRS) Nasopharyngeal Nasopharyngeal Swab     Status: Abnormal   Collection Time: 05/08/19 10:55 PM   Specimen: Nasopharyngeal Swab  Result Value Ref Range Status   SARS Coronavirus 2 POSITIVE (A) NEGATIVE Final    Comment: RESULT CALLED TO, READ BACK BY AND VERIFIED WITH: F.MILES RN [id number] 05/09/2019 MCCORMICK K (NOTE) SARS-CoV-2 target nucleic acids are DETECTED. The SARS-CoV-2 RNA is generally detectable in upper and lower respiratory specimens during the acute phase of infection. Positive results are indicative of the presence of SARS-CoV-2 RNA. Clinical correlation with patient history and other diagnostic information is  necessary to determine patient infection status. Positive results do not rule out bacterial infection or co-infection with other viruses.  The expected result is Negative. Fact Sheet for Patients: SugarRoll.be Fact Sheet for Healthcare Providers: https://www.woods-mathews.com/ This test is not yet approved or cleared by the [place] FDA and   has been authorized for detection and/or diagnosis of SARS-CoV-2 by FDA under an Emergency Use Authorization (EUA). This EUA will remain  in effect (meaning this test can be used) for  the duration of the COVID-19 declaration under Section 564(b)(1) of the Act, 21 U.S.C. section 360bbb-3(b)(1), unless the authorization is terminated or revoked  sooner. Performed at [facility], [place] [address] [place], [place] [address]   CULTURE, BLOOD (ROUTINE X 2) w Reflex to ID Panel     Status: None (Preliminary result)   Collection Time: 05/09/19  7:50 AM   Specimen: BLOOD  Result Value Ref Range Status   Specimen Description BLOOD LH  Final   Special Requests   Final    BOTTLES DRAWN AEROBIC AND ANAEROBIC Blood Culture adequate volume   Culture   Final    NO GROWTH 3 DAYS Performed at [facility], [address], [place], [place] [address]    Report Status PENDING  Incomplete  CULTURE, BLOOD (ROUTINE X 2) w Reflex to ID Panel     Status: None (Preliminary result)   Collection Time: 05/09/19  7:59 AM   Specimen: BLOOD  Result Value Ref Range Status   Specimen Description BLOOD LAC  Final   Special Requests   Final    BOTTLES DRAWN AEROBIC AND ANAEROBIC Blood Culture adequate volume   Culture   Final    NO GROWTH 3 DAYS Performed at [facility], [address], [place], [place] [address]    Report Status PENDING  Incomplete     Time coordinating discharge: Over 30 minutes  SIGNED:   Ezekiel Slocumb, DO [facility] 05/12/2019, 11:19 AM   If 7PM-7AM, please contact night-coverage [email or web address] Password [email or web address]

## 2019-05-12 NOTE — Care Management Important Message (Signed)
Important Message  Patient Details  Name: Corey Carr MRN: [id number] Date of Birth: 08-18-1940   Medicare Important Message Given:  Yes - Important Message mailed due to current National Emergency  Reviewed verbally over phone with patient.  Requested copy be sent to home address on file.    [name] 05/12/2019, 12:11 PM

## 2019-05-12 NOTE — Progress Notes (Signed)
Physical Therapy Treatment Patient Details Name: Corey Carr MRN: [id number] DOB: [birth year] Today's Date: 05/12/2019    History of Present Illness Corey Carr is a 73yoM who comes to [facility] on 12/17 after recent debility/weakness and significant weightloss over the past 6 months. At baseline, pt is a community dwelling adult, still drives, grocery shops, but recently has needed to use his wifes RW or [id number]. Pt denies recent falls or balance issues. Pt tested +for COVID19.    PT Comments    Pt in bed upon entry, awake, interactive, ready to do some PT. AMB 144ft in room with RW, pt still moving fairly well, no LOB with turns. Pt appears to tolerate AMB activity this date with less fatigue limitations than 3DA at eval. Practiced 10xSTS from chair for strengthening and dynamic balance. Pt progressing well. Left up in chair in preparation for meal tray.    Follow Up Recommendations  Home health PT     Equipment Recommendations  None recommended by PT    Recommendations for Other Services       Precautions / Restrictions Precautions Precautions: Fall Restrictions Weight Bearing Restrictions: No    Mobility  Bed Mobility Overal bed mobility: Modified Independent                Transfers Overall transfer level: Modified independent Equipment used: Rolling walker (2 wheeled)             General transfer comment: slow to rise, moderate effort, no apparent balance difficulty.  Ambulation/Gait Ambulation/Gait assistance: Min guard Gait Distance (Feet): 120 Feet Assistive device: Rolling walker (2 wheeled)       General Gait Details: AMB in room, multiple turns around obstacles   Stairs             Wheelchair Mobility    Modified Rankin (Stroke Patients Only)       Balance Overall balance assessment: Modified Independent;No apparent balance deficits (not formally assessed)                                          Cognition  Arousal/Alertness: Awake/alert Behavior During Therapy: WFL for tasks assessed/performed Overall Cognitive Status: Within Functional Limits for tasks assessed                                        Exercises Other Exercises Other Exercises: 10xSTS from chair, use of arms ad lib    General Comments        Pertinent Vitals/Pain Pain Assessment: No/denies pain    Home Living                      Prior Function            PT Goals (current goals can now be found in the care plan section) Acute Rehab PT Goals Patient Stated Goal: regain strength PT Goal Formulation: With patient Time For Goal Achievement: 05/23/19 Potential to Achieve Goals: Fair Progress towards PT goals: Progressing toward goals    Frequency    Min 2X/week      PT Plan Current plan remains appropriate    Co-evaluation              AM-PAC PT "6 Clicks" Mobility   Outcome Measure  Help needed turning from your  back to your side while in a flat bed without using bedrails?: None Help needed moving from lying on your back to sitting on the side of a flat bed without using bedrails?: None Help needed moving to and from a bed to a chair (including a wheelchair)?: None Help needed standing up from a chair using your arms (e.g., wheelchair or bedside chair)?: None Help needed to walk in hospital room?: A Little Help needed climbing 3-5 steps with a railing? : A Little 6 Click Score: 22    End of Session   Activity Tolerance: Patient tolerated treatment well;No increased pain Patient left: in chair;with chair alarm set;with family/visitor present;with call bell/phone within reach Nurse Communication: Mobility status PT Visit Diagnosis: Difficulty in walking, not elsewhere classified (R26.2);Muscle weakness (generalized) (M62.81);Adult, failure to thrive (R62.7)     Time: 1142-1205 PT Time Calculation (min) (ACUTE ONLY): 23 min  Charges:  $Therapeutic Exercise: 8-22  mins $Therapeutic Activity: 8-22 mins                     12:22 PM, 05/12/19 Etta Grandchild, PT, DPT Physical Therapist - [facility]  [phone] ([place])    Vonne Mcdanel C 05/12/2019, 12:20 PM

## 2019-05-13 LAB — LEGIONELLA PNEUMOPHILA SEROGP 1 UR AG: L. pneumophila Serogp 1 Ur Ag: NEGATIVE

## 2019-05-14 LAB — CULTURE, BLOOD (ROUTINE X 2)
Culture: NO GROWTH
Culture: NO GROWTH
Special Requests: ADEQUATE
Special Requests: ADEQUATE

## 2019-05-14 LAB — CHLAMYDIA PANEL SERUM
C. Pneumoniae IgM Serum: <1:10 {titer}
C. Psittaci IgM Serum: <1:10 {titer}
C. Trachomatis IgM Serum: <1:10 {titer}

## 2019-05-23 ENCOUNTER — Encounter: Payer: Self-pay | Admitting: Family Medicine

## 2019-05-23 ENCOUNTER — Observation Stay
Admission: EM | Admit: 2019-05-23 | Discharge: 2019-05-24 | Disposition: A | Discharge status: Home / Self Care | Payer: Medicare Other | Attending: Family Medicine | Admitting: Family Medicine

## 2019-05-23 ENCOUNTER — Emergency Department: Payer: Medicare Other

## 2019-05-23 ENCOUNTER — Other Ambulatory Visit: Payer: Self-pay

## 2019-05-23 DIAGNOSIS — K5900 Constipation, unspecified: Secondary | ICD-10-CM | POA: Diagnosis present

## 2019-05-23 DIAGNOSIS — J449 Chronic obstructive pulmonary disease, unspecified: Secondary | ICD-10-CM | POA: Diagnosis not present

## 2019-05-23 DIAGNOSIS — U071 COVID-19: Secondary | ICD-10-CM

## 2019-05-23 DIAGNOSIS — Z87891 Personal history of nicotine dependence: Secondary | ICD-10-CM | POA: Insufficient documentation

## 2019-05-23 DIAGNOSIS — E875 Hyperkalemia: Secondary | ICD-10-CM | POA: Diagnosis not present

## 2019-05-23 DIAGNOSIS — Z7982 Long term (current) use of aspirin: Secondary | ICD-10-CM | POA: Diagnosis not present

## 2019-05-23 DIAGNOSIS — E860 Dehydration: Secondary | ICD-10-CM | POA: Diagnosis not present

## 2019-05-23 DIAGNOSIS — R918 Other nonspecific abnormal finding of lung field: Secondary | ICD-10-CM | POA: Diagnosis not present

## 2019-05-23 DIAGNOSIS — Z8616 Personal history of COVID-19: Secondary | ICD-10-CM | POA: Insufficient documentation

## 2019-05-23 DIAGNOSIS — R64 Cachexia: Secondary | ICD-10-CM | POA: Insufficient documentation

## 2019-05-23 DIAGNOSIS — Z801 Family history of malignant neoplasm of trachea, bronchus and lung: Secondary | ICD-10-CM | POA: Diagnosis not present

## 2019-05-23 DIAGNOSIS — N179 Acute kidney failure, unspecified: Secondary | ICD-10-CM | POA: Diagnosis not present

## 2019-05-23 DIAGNOSIS — C3492 Malignant neoplasm of unspecified part of left bronchus or lung: Secondary | ICD-10-CM

## 2019-05-23 DIAGNOSIS — I10 Essential (primary) hypertension: Secondary | ICD-10-CM | POA: Diagnosis not present

## 2019-05-23 HISTORY — DX: Essential (primary) hypertension: I10

## 2019-05-23 LAB — CBC WITH DIFFERENTIAL/PLATELET
Abs Immature Granulocytes: 0.08 10*3/uL — ABNORMAL HIGH (ref 0.00–0.07)
Basophils Absolute: 0.1 10*3/uL (ref 0.0–0.1)
Basophils Relative: 0 %
Eosinophils Absolute: 0.1 10*3/uL (ref 0.0–0.5)
Eosinophils Relative: 0 %
HCT: 37.4 % — ABNORMAL LOW (ref 39.0–52.0)
Hemoglobin: 11.8 g/dL — ABNORMAL LOW (ref 13.0–17.0)
Immature Granulocytes: 1 %
Lymphocytes Relative: 9 %
Lymphs Abs: 1.6 10*3/uL (ref 0.7–4.0)
MCH: 25.9 pg — ABNORMAL LOW (ref 26.0–34.0)
MCHC: 31.6 g/dL (ref 30.0–36.0)
MCV: 82.2 fL (ref 80.0–100.0)
Monocytes Absolute: 1.4 10*3/uL — ABNORMAL HIGH (ref 0.1–1.0)
Monocytes Relative: 8 %
Neutro Abs: 14.3 10*3/uL — ABNORMAL HIGH (ref 1.7–7.7)
Neutrophils Relative %: 82 %
Platelets: 551 10*3/uL — ABNORMAL HIGH (ref 150–400)
RBC: 4.55 MIL/uL (ref 4.22–5.81)
RDW: 16.1 % — ABNORMAL HIGH (ref 11.5–15.5)
WBC: 17.4 10*3/uL — ABNORMAL HIGH (ref 4.0–10.5)
nRBC: 0 % (ref 0.0–0.2)

## 2019-05-23 LAB — COMPREHENSIVE METABOLIC PANEL
ALT: 24 U/L (ref 0–44)
AST: 35 U/L (ref 15–41)
Albumin: 3.4 g/dL — ABNORMAL LOW (ref 3.5–5.0)
Alkaline Phosphatase: 84 U/L (ref 38–126)
Anion gap: 14 (ref 5–15)
BUN: 57 mg/dL — ABNORMAL HIGH (ref 8–23)
CO2: 22 mmol/L (ref 22–32)
Calcium: 12 mg/dL — ABNORMAL HIGH (ref 8.9–10.3)
Chloride: 98 mmol/L (ref 98–111)
Creatinine, Ser: 1.63 mg/dL — ABNORMAL HIGH (ref 0.61–1.24)
GFR calc Af Amer: 46 mL/min — ABNORMAL LOW (ref >60)
GFR calc non Af Amer: 40 mL/min — ABNORMAL LOW (ref >60)
Glucose, Bld: 111 mg/dL — ABNORMAL HIGH (ref 70–99)
Potassium: 5.7 mmol/L — ABNORMAL HIGH (ref 3.5–5.1)
Sodium: 134 mmol/L — ABNORMAL LOW (ref 135–145)
Total Bilirubin: 1.4 mg/dL — ABNORMAL HIGH (ref 0.3–1.2)
Total Protein: 8.3 g/dL — ABNORMAL HIGH (ref 6.5–8.1)

## 2019-05-23 LAB — URINALYSIS, COMPLETE (UACMP) WITH MICROSCOPIC
Bacteria, UA: NONE SEEN
Bilirubin Urine: NEGATIVE
Glucose, UA: NEGATIVE mg/dL
Hgb urine dipstick: NEGATIVE
Ketones, ur: 5 mg/dL — AB
Leukocytes,Ua: NEGATIVE
Nitrite: NEGATIVE
Protein, ur: NEGATIVE mg/dL
Specific Gravity, Urine: 1.017 (ref 1.005–1.030)
Squamous Epithelial / HPF: NONE SEEN (ref 0–5)
pH: 5 (ref 5.0–8.0)

## 2019-05-23 LAB — SODIUM, URINE, RANDOM: Sodium, Ur: 59 mmol/L

## 2019-05-23 LAB — LIPASE, BLOOD: Lipase: 22 U/L (ref 11–51)

## 2019-05-23 LAB — CREATININE, URINE, RANDOM: Creatinine, Urine: 128 mg/dL

## 2019-05-23 MED ORDER — ONDANSETRON HCL 4 MG PO TABS: 4.0000 mg | ORAL_TABLET | Freq: Four times a day (QID) | ORAL | Status: DC | PRN

## 2019-05-23 MED ORDER — POLYETHYLENE GLYCOL 3350 17 G PO PACK: 17.0000 g | PACK | Freq: Two times a day (BID) | ORAL | Status: DC | PRN

## 2019-05-23 MED ORDER — MEGESTROL ACETATE 400 MG/10ML PO SUSP
400.0000 mg | Freq: Every day | ORAL | Status: DC
  Administered 2019-05-24: 400 mg via ORAL
  Filled 2019-05-23 (×2): qty 10

## 2019-05-23 MED ORDER — ENOXAPARIN SODIUM 40 MG/0.4ML ~~LOC~~ SOLN
30.0000 mg | SUBCUTANEOUS | Status: DC
  Administered 2019-05-23: 30 mg via SUBCUTANEOUS
  Filled 2019-05-23: qty 0.4

## 2019-05-23 MED ORDER — ACETAMINOPHEN 325 MG PO TABS: 650.0000 mg | ORAL_TABLET | Freq: Four times a day (QID) | ORAL | Status: DC | PRN

## 2019-05-23 MED ORDER — ACETAMINOPHEN 650 MG RE SUPP: 650.0000 mg | Freq: Four times a day (QID) | RECTAL | Status: DC | PRN

## 2019-05-23 MED ORDER — MEGESTROL ACETATE 20 MG PO TABS
40.0000 mg | ORAL_TABLET | Freq: Every day | ORAL | Status: DC
  Filled 2019-05-23: qty 2

## 2019-05-23 MED ORDER — ENOXAPARIN SODIUM 40 MG/0.4ML ~~LOC~~ SOLN: 40.0000 mg | SUBCUTANEOUS | Status: DC

## 2019-05-23 MED ORDER — LACTULOSE 10 GM/15ML PO SOLN
20.0000 g | Freq: Once | ORAL | Status: AC
  Administered 2019-05-23: 20 g via ORAL
  Filled 2019-05-23: qty 30

## 2019-05-23 MED ORDER — LABETALOL HCL 200 MG PO TABS
200.0000 mg | ORAL_TABLET | Freq: Three times a day (TID) | ORAL | Status: DC | PRN
  Filled 2019-05-23: qty 1

## 2019-05-23 MED ORDER — SENNOSIDES-DOCUSATE SODIUM 8.6-50 MG PO TABS: 1.0000 | ORAL_TABLET | Freq: Two times a day (BID) | ORAL | Status: DC | PRN

## 2019-05-23 MED ORDER — UMECLIDINIUM BROMIDE 62.5 MCG/INH IN AEPB
1.0000 | INHALATION_SPRAY | Freq: Every day | RESPIRATORY_TRACT | Status: DC
  Administered 2019-05-23 – 2019-05-24 (×2): 1 via RESPIRATORY_TRACT
  Filled 2019-05-23: qty 7

## 2019-05-23 MED ORDER — LORATADINE 10 MG PO TABS
10.0000 mg | ORAL_TABLET | Freq: Every day | ORAL | Status: DC
  Administered 2019-05-23 – 2019-05-24 (×2): 10 mg via ORAL
  Filled 2019-05-23 (×2): qty 1

## 2019-05-23 MED ORDER — SODIUM CHLORIDE 0.9 % IV SOLN: INTRAVENOUS | Status: DC

## 2019-05-23 MED ORDER — TRAMADOL HCL 50 MG PO TABS: 50.0000 mg | ORAL_TABLET | Freq: Four times a day (QID) | ORAL | Status: DC | PRN

## 2019-05-23 MED ORDER — ONDANSETRON HCL 4 MG/2ML IJ SOLN: 4.0000 mg | Freq: Four times a day (QID) | INTRAMUSCULAR | Status: DC | PRN

## 2019-05-23 MED ORDER — ASPIRIN 325 MG PO TABS
325.0000 mg | ORAL_TABLET | Freq: Every day | ORAL | Status: DC
  Administered 2019-05-23 – 2019-05-24 (×2): 325 mg via ORAL
  Filled 2019-05-23 (×3): qty 1

## 2019-05-23 MED ORDER — SODIUM CHLORIDE 0.9 % IV SOLN: Freq: Once | INTRAVENOUS | Status: AC

## 2019-05-23 NOTE — ED Provider Notes (Signed)
[facility] Emergency Department Provider Note       Time seen: ----------------------------------------- 11:44 AM on 05/23/2019 -----------------------------------------   I have reviewed the triage vital signs and the nursing notes.  HISTORY   Chief Complaint No chief complaint on file.   HPI Corey Carr is a 79 y.o. male with a history of COVID-19, AKI, hypertension, dehydration who presents to the ED for constipation.  Patient states he is Covid positive, tested positive on December 17.  Has not had a bowel movement in the last for 5 days.  He has use a stool softener without any relief at home.  No past medical history on file.  Patient Active Problem List   Diagnosis Date Noted  . COVID-19 virus detected 05/10/2019  . AKI (acute kidney injury) ([place]) 05/09/2019  . Abnormal CT lung screening 05/09/2019  . Essential hypertension 05/09/2019  . Asthma 05/09/2019  . Vitamin B12 deficiency 05/09/2019  . Unintentional weight loss 05/09/2019  . Dehydration 05/09/2019  . Seasonal allergies 05/09/2019  . Failure to thrive (0-17) 05/08/2019    Past Surgical History:  Procedure Laterality Date  . KNEE SURGERY Right 1957    Allergies Patient has no allergy information on record.  Social History Social History   Tobacco Use  . Smoking status: Former Smoker    Packs/day: 0.50    Years: 60.00    Pack years: 30.00    Types: Cigarettes    Quit date: 2015    Years since quitting: 6.0  . Smokeless tobacco: Never Used  Substance Use Topics  . Alcohol use: No  . Drug use: No   Review of Systems Constitutional: Negative for fever. Cardiovascular: Negative for chest pain. Respiratory: Negative for shortness of breath. Gastrointestinal: Negative for abdominal pain, positive for constipation Musculoskeletal: Negative for back pain. Skin: Negative for rash. Neurological: Negative for headaches, focal weakness or numbness.  All systems  negative/normal/unremarkable except as stated in the HPI  ____________________________________________   PHYSICAL EXAM:  VITAL SIGNS: ED Triage Vitals  Enc Vitals Group     BP      Pulse      Resp      Temp      Temp src      SpO2      Weight      Height      Head Circumference      Peak Flow      Pain Score      Pain Loc      Pain Edu?      Excl. in [place]?    Constitutional: Alert and oriented. Well appearing and in no distress.  Cachectic Eyes: Conjunctivae are normal. Normal extraocular movements. Cardiovascular: Normal rate, regular rhythm. No murmurs, rubs, or gallops. Respiratory: Normal respiratory effort without tachypnea nor retractions. Breath sounds are clear and equal bilaterally. No wheezes/rales/rhonchi. Gastrointestinal: Soft and nontender. Normal bowel sounds Musculoskeletal: Nontender with normal range of motion in extremities. No lower extremity tenderness nor edema. Neurologic:  Normal speech and language. No gross focal neurologic deficits are appreciated.  Skin:  Skin is warm, dry and intact. No rash noted. Psychiatric: Mood and affect are normal. Speech and behavior are normal.  ___________________________________________  ED COURSE:  As part of my medical decision making, I reviewed the following data within the [place] History obtained from family if available, nursing notes, old chart and ekg, as well as notes from prior ED visits. Patient presented for constipation, we will assess  with labs and imaging as indicated at this time.   Procedures  Corey Carr was evaluated in Emergency Department on 05/23/2019 for the symptoms described in the history of present illness. He was evaluated in the context of the global COVID-19 pandemic, which necessitated consideration that the patient might be at risk for infection with the SARS-CoV-2 virus that causes COVID-19. Institutional protocols and algorithms that pertain to the evaluation of  patients at risk for COVID-19 are in a state of rapid change based on information released by regulatory bodies including the CDC and federal and state organizations. These policies and algorithms were followed during the patient's care in the ED.  ____________________________________________   LABS (pertinent positives/negatives)  Labs Reviewed  CBC WITH DIFFERENTIAL/PLATELET - Abnormal; Notable for the following components:      Result Value   WBC 17.4 (*)    Hemoglobin 11.8 (*)    HCT 37.4 (*)    MCH 25.9 (*)    RDW 16.1 (*)    Platelets 551 (*)    Neutro Abs 14.3 (*)    Monocytes Absolute 1.4 (*)    Abs Immature Granulocytes 0.08 (*)    All other components within normal limits  COMPREHENSIVE METABOLIC PANEL - Abnormal; Notable for the following components:   Sodium 134 (*)    Potassium 5.7 (*)    Glucose, Bld 111 (*)    BUN 57 (*)    Creatinine, Ser 1.63 (*)    Calcium 12.0 (*)    Total Protein 8.3 (*)    Albumin 3.4 (*)    Total Bilirubin 1.4 (*)    GFR calc non Af Amer 40 (*)    GFR calc Af Amer 46 (*)    All other components within normal limits  LIPASE, BLOOD  URINALYSIS, COMPLETE (UACMP) WITH MICROSCOPIC    RADIOLOGY Images were viewed by me  Abdomen 2 view IMPRESSION:  1. Moderate amount of stool identified at the level of the splenic  flexure and in the rectum which may reflect constipation.  2. No evidence for bowel obstruction.  3. Diminished aeration to the left midlung and left base. Similar to  chest radiograph from 05/08/2019  ____________________________________________   DIFFERENTIAL DIAGNOSIS   Constipation, dehydration, electrolyte abnormality, renal failure, failure to thrive  FINAL ASSESSMENT AND PLAN  Constipation, dehydration, AKI, leukocytosis   Plan: The patient had presented for constipation. Patient's labs indicated multiple abnormalities including AKI and electrolyte disturbances likely secondary to dehydration. Patient's imaging  has not revealed any acute process other than constipation.  Patient does appear significantly dehydrated and will need hospital observation.   Laurence Aly, MD    Note: This note was generated in part or whole with voice recognition software. Voice recognition is usually quite accurate but there are transcription errors that can and very often do occur. I apologize for any typographical errors that were not detected and corrected.     Earleen Newport, MD 05/23/19 [phone]

## 2019-05-23 NOTE — Progress Notes (Signed)
PHARMACIST - PHYSICIAN COMMUNICATION  CONCERNING:  Enoxaparin (Lovenox) for DVT Prophylaxis    RECOMMENDATION: Patient was prescribed enoxaprin 40mg  q24 hours for VTE prophylaxis.   Filed Weights   05/23/19 1147  Weight: 117 lb (53.1 kg)    Body mass index is 17.79 kg/m.  Estimated Creatinine Clearance: 28.1 mL/min (A) (by C-G formula based on SCr of 1.63 mg/dL (H)).   Based on [place] patient is candidate for enoxaparin 30mg  every 24 hours based on CrCl <30ml/min or Weight less then 45kg for women or <57kg for men   DESCRIPTION: Pharmacy has adjusted enoxaparin dose per [facility] policy.  Patient is now receiving enoxaparin 30mg  every 24 hours.  Amiir Heckard, PharmD Clinical Pharmacist  05/23/2019 3:15 PM

## 2019-05-23 NOTE — H&P (Addendum)
History and Physical  Patient Name: Corey Carr     JOA:[id number]    DOB: 1940-06-03    DOA: 05/23/2019 PCP: [name], MD  Patient coming from: Home  Chief Complaint: Constipation, Fatigue with exertion      HPI: Corey Carr is a 79 y.o. M with hx HTN, lung mass pending biopsy, suspected cancer, and recent 40 pound weight loss as well as recent diagnosis COVID-19 who presents with constipation.  Patient recently presented with nonspecific symptoms, was found to have creatinine 2.09, BUN 87.  He was incidentally noted to have COVID-19, and so started on remdesivir, but was stable and asymptomatic off oxygen, felt he was at his baseline, was afebrile, and so was discharged home after 3 days remdesivir.   Per wife, since discharge, he has been mostly bedbound, or sitting in chair, can barely walk to bathroom, not eating anything, only drinking an Ensure or two per day.  In the last few days, he has been constipated as well and today complained of ringing in his ears, so wife called EMS.  In the ER, patient noted to have Cr 1.6 (from discharge 0.6 last month), K 5.7, Ca elevated again. Started on fluids and hospitalists called for observation           ROS: Review of Systems  Constitutional: Positive for malaise/fatigue and weight loss. Negative for chills, diaphoresis and fever.  HENT: Positive for tinnitus.   Respiratory: Positive for cough and shortness of breath. Negative for hemoptysis, sputum production and wheezing.   Cardiovascular: Negative for chest pain, palpitations, orthopnea, claudication, leg swelling and PND.  Gastrointestinal: Positive for constipation. Negative for abdominal pain, blood in stool, diarrhea, heartburn, melena, nausea and vomiting.  All other systems reviewed and are negative.         Past Medical History:  Diagnosis Date  . Hypertension     Past Surgical History:  Procedure Laterality Date  . KNEE SURGERY Right 1957     Social History: Patient lives with his wife.  He has no children, a few brothers and sisters in [place].  His wife is on dialysis, she has no living family.  The patient walks with a walker.  Former smoker.  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  Prior to Admission medications   Medication Sig Start Date End Date Taking? Authorizing Provider  aspirin 325 MG tablet Take 1 tablet by mouth daily.    [provider]  cholecalciferol (VITAMIN D3) 25 MCG (1000 UT) tablet Take 1,000 Units by mouth daily.    [provider]  feeding supplement, ENSURE ENLIVE, (ENSURE ENLIVE) LIQD Take 237 mLs by mouth 3 (three) times daily between meals. 05/12/19   Nicole Kindred A, DO  loratadine (ALLERGY RELIEF) 10 MG tablet Take 1 tablet (10 mg total) by mouth daily. 05/13/19   Ezekiel Slocumb, DO  megestrol (MEGACE) 40 MG tablet Take 40 mg by mouth daily. 05/02/19   [provider]  meloxicam (MOBIC) 15 MG tablet Take 15 mg by mouth daily as needed. 04/22/19   [provider]  Multiple Vitamin (MULTIVITAMIN WITH MINERALS) TABS tablet Take 1 tablet by mouth daily. 05/13/19   Nicole Kindred A, DO  omega-3 acid ethyl esters (LOVAZA) 1 g capsule Take 1 g by mouth daily.    [provider]  traMADol (ULTRAM) 50 MG tablet Take 50 mg  by mouth every 6 (six) hours as needed. 04/30/19   [provider]  TRELEGY ELLIPTA 100-62.5-25 MCG/INH AEPB Take 1 puff by mouth daily. 05/01/19   [provider]  vitamin B-12 (CYANOCOBALAMIN) 1000 MCG tablet Take 1 tablet by mouth daily.    [provider]  vitamin C (ASCORBIC ACID) 250 MG tablet Take 250 mg by mouth daily.    [provider]  vitamin E (VITAMIN E) 400 UNIT capsule Take 400 Units by mouth daily.    [provider]       Physical Exam: Temp 97.7  F (36.5 C) (Oral)   Resp 14   Ht 5\' 8"  (1.727 m)   Wt 53.1 kg   SpO2 94%   BMI 17.79 kg/m  General appearance: Cachectic elderly adult male, alert and in no acute distress, lying in bed, appears listless, but interactive.   Eyes: Anicteric, conjunctiva pink, lids and lashes normal. PERRL.    ENT: No nasal deformity, discharge, epistaxis.  Hearing diminished. OP dry without lesions.   Neck: No neck masses.  Trachea midline.  No thyromegaly/tenderness. Lymph: No cervical or supraclavicular lymphadenopathy. Skin: Warm and dry.  No jaundice.  No suspicious rashes or lesions. Cardiac: RRR, nl S1-S2, no murmurs appreciated.  Capillary refill is brisk.  JVP normal.  No LE edema.  Radial pulses 2+ and symmetric. Respiratory: Normal respiratory rate and rhythm.  CTAB without rales or wheezes.  Diminished at both bases. Abdomen: Abdomen soft.  No TTP or guarding.  Scaphoid. No ascites, distension, hepatosplenomegaly.   MSK: No deformities or effusions of the large joints of the upper or lower extremities bilaterally.  No cyanosis or clubbing.  Diffuse severe loss of subcutaneous muscle mass and fat, temporal wasting, thenar wasting noted. Neuro: Cranial nerves 3 through 12 intact.  Sensation intact to light touch. Speech is fluent.  Muscle strength 5 -/5 and symmetric coordination.    Psych: Sensorium intact and responding to questions, attention normal.  Behavior appropriate.  Affect flat.  Judgment and insight appear somewhat impaired, unable to describe the current state of his work-up for his lung mass.     Labs on Admission:  I have personally reviewed following labs and imaging studies: CBC: Recent Labs  Lab 05/23/19 1224  WBC 17.4*  NEUTROABS 14.3*  HGB 11.8*  HCT 37.4*  MCV 82.2  PLT 812*   Basic Metabolic Panel: Recent Labs  Lab 05/23/19 1224  NA 134*  K 5.7*  CL 98  CO2 22  GLUCOSE 111*  BUN 57*  CREATININE 1.63*  CALCIUM 12.0*   GFR: Estimated Creatinine Clearance:  28.1 mL/min (A) (by C-G formula based on SCr of 1.63 mg/dL (H)).  Liver Function Tests: Recent Labs  Lab 05/23/19 1224  AST 35  ALT 24  ALKPHOS 84  BILITOT 1.4*  PROT 8.3*  ALBUMIN 3.4*   Recent Labs  Lab 05/23/19 1224  LIPASE 22         Radiological Exams on Admission: Personally reviewed chest x-ray shows emphysema, old bronchitic change, old left lower lobe lung mass, abdomen x-ray reviewed, shows no small bowel obstruction: DG Chest 1 View  Result Date: 05/23/2019 CLINICAL DATA:  History of recent COVID-19 EXAM: CHEST  1 VIEW COMPARISON:  05/08/2019 FINDINGS: Cardiac shadows within normal limits. Right lung demonstrates some mild interstitial changes without focal infiltrate. Chronic volume loss in the left base is noted similar to that seen on prior exam. IMPRESSION: Chronic changes in the left base.  No  acute infiltrate is seen. Electronically Signed   By: Inez Catalina M.D.   On: 05/23/2019 13:32   DG Abd 2 Views  Result Date: 05/23/2019 CLINICAL DATA:  Constipation. EXAM: ABDOMEN - 2 VIEW COMPARISON:  Chest x-ray 05/08/2019. FINDINGS: There is a moderate amount of stool identified within the left upper quadrant of the abdomen and in the rectum. No dilated loops of bowel. Asymmetric elevation of the left hemidiaphragm is noted. There is diminished aeration to the left midlung and left base. IMPRESSION: 1. Moderate amount of stool identified at the level of the splenic flexure and in the rectum which may reflect constipation. 2. No evidence for bowel obstruction. 3. Diminished aeration to the left midlung and left base. Similar to chest radiograph from 05/08/2019 Electronically Signed   By: Kerby Moors M.D.   On: 05/23/2019 12:35        Assessment/Plan   Acute kidney injury Baseline creatinine 0.6 currently almost tripled to 1.6. -IV fluids -Check urine electrolytes, urinalysis -Trend BMP  Hyperkalemia Due to AKI. -Check EKG  Constipation -IV fluids and  lactulose x1 -As needed senna or MiraLAX  Cancer related cachexia -Continue Megace   Hypercalcemia In setting of cancer and dehdyration.  Normalized with fluids last admission. -Check PTH, PTHrP  COPD No active bronchospasm -Continue Incruse  Hypertension -Hold HCTZ, ACE -Continue aspirin  COVID-19 Diagnosed on 12/17, still within a 21-day window. -Airborne and contact     DVT prophylaxis: Lovenox Code Status: Full code Family Communication: Wife by phone Disposition Plan: Anticipate IV fluids overnight, if BMP better tomorrow, back home Consults called: None Admission status: OBS   At the point of initial evaluation, it is my clinical opinion that admission for OBSERVATION is reasonable and necessary because the patient's presenting complaints in the context of their chronic conditions represent sufficient risk of deterioration or significant morbidity to constitute reasonable grounds for close observation in the hospital setting, but that the patient may be medically stable for discharge from the hospital within 24 to 48 hours.    Medical decision making: Patient seen at 3:10 PM on 05/23/2019.  The patient was discussed with Dr. Jimmye Norman.  What exists of the patient's chart was reviewed in depth and summarized above.  Clinical condition: Stable.        [facility] Please page though [place] or Epic secure chat:  For password, contact charge nurse

## 2019-05-23 NOTE — ED Triage Notes (Signed)
Per EMS report, patient has not had a bowel movement for 3-4 days. Patient tested positive for Covid on 12/17. Patient comes from home. Patient states he used stool softener without relief.

## 2019-05-24 DIAGNOSIS — N179 Acute kidney failure, unspecified: Secondary | ICD-10-CM | POA: Diagnosis present

## 2019-05-24 DIAGNOSIS — E860 Dehydration: Secondary | ICD-10-CM | POA: Diagnosis not present

## 2019-05-24 LAB — CBC
HCT: 35.3 % — ABNORMAL LOW (ref 39.0–52.0)
Hemoglobin: 11.2 g/dL — ABNORMAL LOW (ref 13.0–17.0)
MCH: 26 pg (ref 26.0–34.0)
MCHC: 31.7 g/dL (ref 30.0–36.0)
MCV: 82.1 fL (ref 80.0–100.0)
Platelets: 547 10*3/uL — ABNORMAL HIGH (ref 150–400)
RBC: 4.3 MIL/uL (ref 4.22–5.81)
RDW: 16.1 % — ABNORMAL HIGH (ref 11.5–15.5)
WBC: 17.3 10*3/uL — ABNORMAL HIGH (ref 4.0–10.5)
nRBC: 0 % (ref 0.0–0.2)

## 2019-05-24 LAB — BASIC METABOLIC PANEL
Anion gap: 13 (ref 5–15)
BUN: 44 mg/dL — ABNORMAL HIGH (ref 8–23)
CO2: 22 mmol/L (ref 22–32)
Calcium: 11.5 mg/dL — ABNORMAL HIGH (ref 8.9–10.3)
Chloride: 102 mmol/L (ref 98–111)
Creatinine, Ser: 1.08 mg/dL (ref 0.61–1.24)
GFR calc Af Amer: >60 mL/min (ref >60)
GFR calc non Af Amer: >60 mL/min (ref >60)
Glucose, Bld: 94 mg/dL (ref 70–99)
Potassium: 4.6 mmol/L (ref 3.5–5.1)
Sodium: 137 mmol/L (ref 135–145)

## 2019-05-24 NOTE — Care Management Obs Status (Addendum)
[place] NOTIFICATION   Patient Details  Name: Corey Carr MRN: [id number] Date of Birth: 1941-03-18   Medicare Observation Status Notification Given:  Yes    [place], LCSW 05/24/2019, 11:34 AM

## 2019-05-24 NOTE — Care Management CC44 (Addendum)
Condition Code 44 Documentation Completed  Patient Details  Name: Corey Carr MRN: [id number] Date of Birth: Dec 10, 1940   Condition Code 44 given:  Yes Patient signature on Condition Code 44 notice:  Yes Documentation of 2 MD's agreement:  Yes Code 44 added to claim:  Yes    [place], LCSW 05/24/2019, 11:14 AM

## 2019-05-24 NOTE — Care Management CC44 (Addendum)
Condition Code 44 Documentation Completed  Patient Details  Name: Corey Carr MRN: [id number] Date of Birth: 07/28/1940   Condition Code 44 given:  Yes Patient signature on Condition Code 44 notice:  Yes Documentation of 2 MD's agreement:  Yes Code 44 added to claim:  Yes    [place], LCSW 05/24/2019, 11:15 AM

## 2019-05-24 NOTE — Care Management CC44 (Addendum)
Condition Code 44 Documentation Completed  Patient Details  Name: Corey Carr MRN: [id number] Date of Birth: [birth year]   Condition Code 44 given:  Yes Patient signature on Condition Code 44 notice:  Yes Documentation of 2 MD's agreement:  Yes Code 44 added to claim:  Yes    [place], LCSW 05/24/2019, 11:35 AM

## 2019-05-24 NOTE — Discharge Summary (Signed)
Physician Discharge Summary  Corey Carr GMW:[id number] DOB: 01-02-1941 DOA: 05/23/2019  PCP: [name], MD  Admit date: 05/23/2019 Discharge date: 05/24/2019  Admitted From: Home  Disposition:  Home   Recommendations for Outpatient Follow-up:  1. Follow up with PCP Dr. Lovie Macadamia as soon as able  2. Was recommended to isolate for 2 weeks, until Jan 2.  3. Dr. Lovie Macadamia: Patient with two admissions for dehydration, inadequate oral intake in 2 weeks --> home health arranged, but please discuss goals of care, Palliative options if patient continuing to fail at home 4. Dr. Lovie Macadamia: Please follow up PTHrP and PTH which are pending         Home Health: PT/RN/SW given patients fatigue with exertion, inability to leave home safely alone Equipment/Devices: Walker  Discharge Condition: Poor, declining  CODE STATUS: FULL Diet recommendation: Liberal      Brief/Interim Summary: Corey Carr is a 79 y.o. M with hx HTN, lung mass pending biopsy, suspected cancer, and recent 40 pound weight loss as well as recent diagnosis COVID-19 who presented with poor oral intake and no bowel movement.    Patient had recently presented with nonspecific symptoms, was found to have creatinine 2.09, BUN 87.  He was incidentally noted to have COVID-19, likely the cause of his asthenia.  Since discharge, he has been mostly bedbound, or sitting in chair, can barely walk to bathroom, is not eating or drinking anything.  Finally, wife brought him back to the ER.  In the ER, patient noted to have Cr 1.6 (from discharge 0.6 last month), K 5.7, Ca elevated again. Started on fluids and hospitalists called for observation     [place] DIAGNOSIS: Dehydration, failure to thrive    Discharge Diagnoses:   Acute kidney injury Creatinine 1.6 at admission.  Given fluids overnight and Cr improved.  FeNA 0.6%.    Emphasized to patient need to adequately push fluids and nutrition.  Hold HCTZ/ACEi.     Given his cachexia, asthenia from [place] he now has a worsening, very poor functional status.  His weight is continuing to decline on Megace, and COVID is associated with anorexia and lassitude that is not typically responsive to treatment, and in his case, likely spiraling soon.    I attempted to discuss prognosis and broach the subject of Hospice with patient and wife, but they have limited insight into the nature of his lung mass work up and did not comprehend my meaning. I spoke with on-call from his PCP's office, and hope that they can bring him in soon to discuss goals of care, as I fear he is no longer a candidate for chemotherapy, that he has a prognosis of weeks, or perhaps less, and that he may need Hospice soon.    Hyperkalemia Resolved.  Cancer related cachexia  Hypercalcemia In setting of cancer and dehdyration.  Improved with fluids   COPD No active bronchospasm  Hypertension  COVID-19 Diagnosed on 12/17.  Per discharge summary, 21 day isolation period from onset of his symptoms would be until today, Jan 2.  Can subsequently discontinue precautions.              Discharge Instructions  Discharge Instructions    Discharge instructions   Complete by: As directed    You were admitted with dehydration.  This came because COVID causes people to be very NOT hungry.  You have to take your Megace every day  You have to drink 3 Ensures every day, and try to eat solid  food as much as able.  In addition to the three Ensures, you need to [place] at least 5 glasses of water every day.  Use a piece of paper to keep track of these.  Call Dr. Lovie Macadamia to try to coordinate your PET scan and the biopsy of your lung.  Work with PT on getting stronger.  DO NOT take your blood pressure medicine for now (the lisinopril-HCTZ/hydrochlorothiazide)  Call Dr. Reuel Boom office asap to get an appointment and have your labs checked in 1 week   Increase activity  slowly   Complete by: As directed      Allergies as of 05/24/2019   No Known Allergies     Medication List    STOP taking these medications   losartan-hydrochlorothiazide 100-25 MG tablet Commonly known as: HYZAAR     TAKE these medications   aspirin 325 MG tablet Take 1 tablet by mouth daily.   cholecalciferol 25 MCG (1000 UT) tablet Commonly known as: VITAMIN D3 Take 1,000 Units by mouth daily.   feeding supplement (ENSURE ENLIVE) Liqd Take 237 mLs by mouth 3 (three) times daily between meals.   Incruse Ellipta 62.5 MCG/INH Aepb Generic drug: umeclidinium bromide Inhale 1 puff into the lungs daily.   loratadine 10 MG tablet Commonly known as: Allergy Relief Take 1 tablet (10 mg total) by mouth daily.   megestrol 40 MG tablet Commonly known as: MEGACE Take 40 mg by mouth daily.   multivitamin with minerals Tabs tablet Take 1 tablet by mouth daily.   omega-3 acid ethyl esters 1 g capsule Commonly known as: LOVAZA Take 1 g by mouth daily.   omeprazole 20 MG capsule Commonly known as: PRILOSEC Take 20 mg by mouth daily as needed.   Super B Complex/Vitamin C Tabs Take 1 tablet by mouth daily.   traMADol 50 MG tablet Commonly known as: ULTRAM Take 50 mg by mouth every 6 (six) hours as needed.   vitamin C 250 MG tablet Commonly known as: ASCORBIC ACID Take 250 mg by mouth daily.   vitamin E 400 UNIT capsule Generic drug: vitamin E Take 400 Units by mouth daily.      Follow-up Information    [name], MD. Schedule an appointment as soon as possible for a visit in 1 week(s).   Specialty: Family Medicine Contact information: [address] [address] [phone]          No Known Allergies    Procedures/Studies: DG Chest 1 View  Result Date: 05/23/2019 CLINICAL DATA:  History of recent COVID-19 EXAM: CHEST  1 VIEW COMPARISON:  05/08/2019 FINDINGS: Cardiac shadows within normal limits. Right lung demonstrates some mild  interstitial changes without focal infiltrate. Chronic volume loss in the left base is noted similar to that seen on prior exam. IMPRESSION: Chronic changes in the left base.  No acute infiltrate is seen. Electronically Signed   By: Inez Catalina M.D.   On: 05/23/2019 13:32   DG Chest 2 View  Result Date: 05/08/2019 CLINICAL DATA:  Weakness EXAM: CHEST - 2 VIEW COMPARISON:  CT 03/06/2019 FINDINGS: There is hyperinflation of the lungs compatible with COPD. Continued opacification in the left lower lung is stable since prior CT. No confluent opacity on the right. Possible small left effusion. Volume loss on the left with shift of mediastinal structures to the left. Heart is normal size. No acute bony abnormality. IMPRESSION: COPD. Chronic volume loss on the left with chronic left lower lung opacity and possible small  left effusion. Electronically Signed   By: Rolm Baptise M.D.   On: 05/08/2019 18:45   [place] CHEST (PLEURAL EFFUSION)  Result Date: 05/02/2019 CLINICAL DATA:  Please perform chest ultrasound ultrasound-guided thoracentesis as indicated. EXAM: CHEST ULTRASOUND COMPARISON:  Chest radiograph-04/30/2019; chest CT-03/04/2019 FINDINGS: Sonographic evaluation of the left and right chest is negative for any significant pleural fluid. No thoracentesis attempted. IMPRESSION: No significant left-sided pleural fluid. No thoracentesis attempted. Findings on preceding chest radiograph likely correlate with consolidative opacities, volume loss and architectural distortion demonstrated on chest CT performed 03/06/2019. Electronically Signed   By: Sandi Mariscal M.D.   On: 05/02/2019 12:55   US RENAL  Result Date: 05/09/2019 CLINICAL DATA:  Acute renal injury. EXAM: RENAL / URINARY TRACT ULTRASOUND COMPLETE COMPARISON:  None. FINDINGS: Right Kidney: Renal measurements: 9.3 x 4.4 x 3.9 cm = volume: 84 mL. The right kidney is isoechoic to the index organ, the liver. No mass lesion or stone is present. There is no  hydronephrosis. Left Kidney: Renal measurements: 8.6 x 4.7 x 4.7 cm = volume: 100 mL. The left kidney is isoechoic to the index organ, the spleen. No mass lesion or stone is present. There is no hydronephrosis. Bladder: Diffuse bladder wall thickening is noted. No discrete mass lesion is present. Diverticula are noted. Other: The prostate is enlarged, measuring up to 5.2 cm in transverse diameter IMPRESSION: 1. Kidneys are mildly echogenic bilaterally. This is nonspecific, but can be seen in the setting of medical renal disease. 2. No urinary tract obstruction. 3. Diffuse bladder wall thickening. Question infection or cystitis. No discrete mass lesion is present. 4. Prostate hypertrophy. Electronically Signed   By: San Morelle M.D.   On: 05/09/2019 07:48   DG Abd 2 Views  Result Date: 05/23/2019 CLINICAL DATA:  Constipation. EXAM: ABDOMEN - 2 VIEW COMPARISON:  Chest x-ray 05/08/2019. FINDINGS: There is a moderate amount of stool identified within the left upper quadrant of the abdomen and in the rectum. No dilated loops of bowel. Asymmetric elevation of the left hemidiaphragm is noted. There is diminished aeration to the left midlung and left base. IMPRESSION: 1. Moderate amount of stool identified at the level of the splenic flexure and in the rectum which may reflect constipation. 2. No evidence for bowel obstruction. 3. Diminished aeration to the left midlung and left base. Similar to chest radiograph from 05/08/2019 Electronically Signed   By: Kerby Moors M.D.   On: 05/23/2019 12:35   [place] EKG SITE RITE  Result Date: 05/10/2019 If Site Rite image not attached, placement could not be confirmed due to current cardiac rhythm.      Subjective: Still very tired, weak.  No fever, cough, confusion.    Discharge Exam: Vitals:   05/24/19 0600 05/24/19 1101  BP: 115/69 124/81  Pulse: 71 76  Resp: 16 16  Temp: 99 F (37.2 C)   SpO2: 99% 98%   Vitals:   05/23/19 2300 05/23/19 2330  05/24/19 0600 05/24/19 1101  BP: 112/69 123/80 115/69 124/81  Pulse: 88  71 76  Resp:   16 16  Temp:   99 F (37.2 C)   TempSrc:   Oral   SpO2: 99%  99% 98%  Weight:      Height:        General: Pt is sleeping, but awakes and is alert, not in acute distress, he is cachectic, has temporal wasting, extreme loss of muscle mass and fat, listless Cardiovascular: RRR, nl S1-S2, no murmurs appreciated.  No LE edema.   Respiratory: Normal respiratory rate and rhythm.  CTAB without rales or wheezes.  Lung sounds diminished Abdominal: Abdomen scaphoid and non-tender.  No distension or HSM.   Neuro/Psych: Strength symmetrically diminished in upper and lower extremities.  Judgment and insight appear impaired.   The results of significant diagnostics from this hospitalization (including imaging, microbiology, ancillary and laboratory) are listed below for reference.     Microbiology: No results found for this or any previous visit (from the past 240 hour(s)).   Labs: BNP (last 3 results) No results for input(s): BNP in the last 8760 hours. Basic Metabolic Panel: Recent Labs  Lab 05/23/19 1224 05/24/19 0745  NA 134* 137  K 5.7* 4.6  CL 98 102  CO2 22 22  GLUCOSE 111* 94  BUN 57* 44*  CREATININE 1.63* 1.08  CALCIUM 12.0* 11.5*   Liver Function Tests: Recent Labs  Lab 05/23/19 1224  AST 35  ALT 24  ALKPHOS 84  BILITOT 1.4*  PROT 8.3*  ALBUMIN 3.4*   Recent Labs  Lab 05/23/19 1224  LIPASE 22   No results for input(s): AMMONIA in the last 168 hours. CBC: Recent Labs  Lab 05/23/19 1224 05/24/19 0745  WBC 17.4* 17.3*  NEUTROABS 14.3*  --   HGB 11.8* 11.2*  HCT 37.4* 35.3*  MCV 82.2 82.1  PLT 551* 547*   Cardiac Enzymes: No results for input(s): CKTOTAL, CKMB, CKMBINDEX, TROPONINI in the last 168 hours. BNP: Invalid input(s): POCBNP CBG: No results for input(s): GLUCAP in the last 168 hours. D-Dimer No results for input(s): DDIMER in the last 72 hours. Hgb  A1c No results for input(s): HGBA1C in the last 72 hours. Lipid Profile No results for input(s): CHOL, HDL, LDLCALC, TRIG, CHOLHDL, LDLDIRECT in the last 72 hours. Thyroid function studies No results for input(s): TSH, T4TOTAL, T3FREE, THYROIDAB in the last 72 hours.  Invalid input(s): FREET3 Anemia work up No results for input(s): VITAMINB12, FOLATE, FERRITIN, TIBC, IRON, RETICCTPCT in the last 72 hours. Urinalysis    Component Value Date/Time   COLORURINE AMBER (A) 05/23/2019 1540   APPEARANCEUR CLOUDY (A) 05/23/2019 1540   APPEARANCEUR Clear 11/14/2016 1538   LABSPEC 1.017 05/23/2019 1540   PHURINE 5.0 05/23/2019 1540   GLUCOSEU NEGATIVE 05/23/2019 1540   HGBUR NEGATIVE 05/23/2019 1540   BILIRUBINUR NEGATIVE 05/23/2019 1540   BILIRUBINUR Negative 11/14/2016 1538   KETONESUR 5 (A) 05/23/2019 1540   PROTEINUR NEGATIVE 05/23/2019 1540   NITRITE NEGATIVE 05/23/2019 1540   LEUKOCYTESUR NEGATIVE 05/23/2019 1540   Sepsis Labs Invalid input(s): PROCALCITONIN,  WBC,  LACTICIDVEN Microbiology No results found for this or any previous visit (from the past 240 hour(s)).   Time coordinating discharge: 35 minutes      SIGNED:   Edwin Dada, MD  [facility] 05/24/2019, 3:57 PM

## 2019-05-25 LAB — PTH, INTACT AND CALCIUM
Calcium, Total (PTH): 12.3 mg/dL — ABNORMAL HIGH (ref 8.6–10.2)
PTH: 4 pg/mL — ABNORMAL LOW (ref 15–65)

## 2019-05-28 ENCOUNTER — Other Ambulatory Visit: Payer: Medicare Other

## 2019-05-29 ENCOUNTER — Encounter: Payer: Self-pay | Admitting: Urology

## 2019-05-30 ENCOUNTER — Ambulatory Visit: Payer: Medicare Other | Admitting: Urology

## 2019-06-01 LAB — PTH-RELATED PEPTIDE: PTH-related peptide: <2 pmol/L

## 2019-06-06 ENCOUNTER — Other Ambulatory Visit: Payer: Self-pay | Admitting: Pulmonary Disease

## 2019-06-06 DIAGNOSIS — R634 Abnormal weight loss: Secondary | ICD-10-CM

## 2019-06-06 DIAGNOSIS — R9389 Abnormal findings on diagnostic imaging of other specified body structures: Secondary | ICD-10-CM

## 2019-06-06 DIAGNOSIS — C3402 Malignant neoplasm of left main bronchus: Secondary | ICD-10-CM

## 2019-06-17 ENCOUNTER — Emergency Department: Payer: Medicare Other

## 2019-06-17 ENCOUNTER — Inpatient Hospital Stay
Admission: EM | Admit: 2019-06-17 | Discharge: 2019-06-19 | DRG: 640 | Disposition: A | Discharge status: Home Health | Payer: Medicare Other | Attending: Internal Medicine | Admitting: Internal Medicine

## 2019-06-17 ENCOUNTER — Other Ambulatory Visit: Payer: Self-pay

## 2019-06-17 DIAGNOSIS — Z825 Family history of asthma and other chronic lower respiratory diseases: Secondary | ICD-10-CM | POA: Diagnosis not present

## 2019-06-17 DIAGNOSIS — Z7951 Long term (current) use of inhaled steroids: Secondary | ICD-10-CM | POA: Diagnosis not present

## 2019-06-17 DIAGNOSIS — G9341 Metabolic encephalopathy: Secondary | ICD-10-CM

## 2019-06-17 DIAGNOSIS — Z681 Body mass index (BMI) 19 or less, adult: Secondary | ICD-10-CM | POA: Diagnosis not present

## 2019-06-17 DIAGNOSIS — Z66 Do not resuscitate: Secondary | ICD-10-CM | POA: Diagnosis present

## 2019-06-17 DIAGNOSIS — Z87891 Personal history of nicotine dependence: Secondary | ICD-10-CM | POA: Diagnosis not present

## 2019-06-17 DIAGNOSIS — R64 Cachexia: Secondary | ICD-10-CM

## 2019-06-17 DIAGNOSIS — E860 Dehydration: Secondary | ICD-10-CM | POA: Diagnosis present

## 2019-06-17 DIAGNOSIS — Z7982 Long term (current) use of aspirin: Secondary | ICD-10-CM

## 2019-06-17 DIAGNOSIS — J449 Chronic obstructive pulmonary disease, unspecified: Secondary | ICD-10-CM | POA: Diagnosis present

## 2019-06-17 DIAGNOSIS — R634 Abnormal weight loss: Secondary | ICD-10-CM | POA: Diagnosis present

## 2019-06-17 DIAGNOSIS — D630 Anemia in neoplastic disease: Secondary | ICD-10-CM | POA: Diagnosis present

## 2019-06-17 DIAGNOSIS — E43 Unspecified severe protein-calorie malnutrition: Secondary | ICD-10-CM | POA: Diagnosis present

## 2019-06-17 DIAGNOSIS — Z79899 Other long term (current) drug therapy: Secondary | ICD-10-CM

## 2019-06-17 DIAGNOSIS — Z801 Family history of malignant neoplasm of trachea, bronchus and lung: Secondary | ICD-10-CM

## 2019-06-17 DIAGNOSIS — C3492 Malignant neoplasm of unspecified part of left bronchus or lung: Secondary | ICD-10-CM | POA: Diagnosis present

## 2019-06-17 DIAGNOSIS — K219 Gastro-esophageal reflux disease without esophagitis: Secondary | ICD-10-CM | POA: Diagnosis present

## 2019-06-17 DIAGNOSIS — J982 Interstitial emphysema: Secondary | ICD-10-CM | POA: Diagnosis present

## 2019-06-17 DIAGNOSIS — Z8616 Personal history of COVID-19: Secondary | ICD-10-CM | POA: Diagnosis not present

## 2019-06-17 DIAGNOSIS — R1310 Dysphagia, unspecified: Secondary | ICD-10-CM | POA: Diagnosis present

## 2019-06-17 DIAGNOSIS — N179 Acute kidney failure, unspecified: Secondary | ICD-10-CM | POA: Diagnosis present

## 2019-06-17 DIAGNOSIS — C3432 Malignant neoplasm of lower lobe, left bronchus or lung: Secondary | ICD-10-CM | POA: Diagnosis present

## 2019-06-17 DIAGNOSIS — I10 Essential (primary) hypertension: Secondary | ICD-10-CM | POA: Diagnosis present

## 2019-06-17 DIAGNOSIS — R918 Other nonspecific abnormal finding of lung field: Secondary | ICD-10-CM

## 2019-06-17 LAB — COMPREHENSIVE METABOLIC PANEL
ALT: 16 U/L (ref 0–44)
AST: 20 U/L (ref 15–41)
Albumin: 3.1 g/dL — ABNORMAL LOW (ref 3.5–5.0)
Alkaline Phosphatase: 78 U/L (ref 38–126)
Anion gap: 16 — ABNORMAL HIGH (ref 5–15)
BUN: 32 mg/dL — ABNORMAL HIGH (ref 8–23)
CO2: 23 mmol/L (ref 22–32)
Calcium: 12.5 mg/dL — ABNORMAL HIGH (ref 8.9–10.3)
Chloride: 94 mmol/L — ABNORMAL LOW (ref 98–111)
Creatinine, Ser: 1.47 mg/dL — ABNORMAL HIGH (ref 0.61–1.24)
GFR calc Af Amer: 52 mL/min — ABNORMAL LOW (ref >60)
GFR calc non Af Amer: 45 mL/min — ABNORMAL LOW (ref >60)
Glucose, Bld: 126 mg/dL — ABNORMAL HIGH (ref 70–99)
Potassium: 4.3 mmol/L (ref 3.5–5.1)
Sodium: 133 mmol/L — ABNORMAL LOW (ref 135–145)
Total Bilirubin: 1 mg/dL (ref 0.3–1.2)
Total Protein: 6.9 g/dL (ref 6.5–8.1)

## 2019-06-17 LAB — CBC
HCT: 33.3 % — ABNORMAL LOW (ref 39.0–52.0)
Hemoglobin: 11.2 g/dL — ABNORMAL LOW (ref 13.0–17.0)
MCH: 27 pg (ref 26.0–34.0)
MCHC: 33.6 g/dL (ref 30.0–36.0)
MCV: 80.2 fL (ref 80.0–100.0)
Platelets: 710 10*3/uL — ABNORMAL HIGH (ref 150–400)
RBC: 4.15 MIL/uL — ABNORMAL LOW (ref 4.22–5.81)
RDW: 16.7 % — ABNORMAL HIGH (ref 11.5–15.5)
WBC: 19.6 10*3/uL — ABNORMAL HIGH (ref 4.0–10.5)
nRBC: 0 % (ref 0.0–0.2)

## 2019-06-17 LAB — URINALYSIS, COMPLETE (UACMP) WITH MICROSCOPIC
Bacteria, UA: NONE SEEN
Bilirubin Urine: NEGATIVE
Glucose, UA: NEGATIVE mg/dL
Hgb urine dipstick: NEGATIVE
Ketones, ur: NEGATIVE mg/dL
Nitrite: NEGATIVE
Protein, ur: NEGATIVE mg/dL
Specific Gravity, Urine: 1.038 — ABNORMAL HIGH (ref 1.005–1.030)
Squamous Epithelial / HPF: NONE SEEN (ref 0–5)
pH: 5 (ref 5.0–8.0)

## 2019-06-17 LAB — PHOSPHORUS: Phosphorus: 3.6 mg/dL (ref 2.5–4.6)

## 2019-06-17 LAB — MAGNESIUM: Magnesium: 2.3 mg/dL (ref 1.7–2.4)

## 2019-06-17 LAB — TROPONIN I (HIGH SENSITIVITY): Troponin I (High Sensitivity): 29 ng/L — ABNORMAL HIGH (ref <18)

## 2019-06-17 MED ORDER — SODIUM CHLORIDE 0.9 % IV SOLN: INTRAVENOUS | Status: AC

## 2019-06-17 MED ORDER — LACTATED RINGERS IV BOLUS
1000.0000 mL | Freq: Once | INTRAVENOUS | Status: AC
  Administered 2019-06-17: 1000 mL via INTRAVENOUS

## 2019-06-17 MED ORDER — ONDANSETRON HCL 4 MG PO TABS: 4.0000 mg | ORAL_TABLET | Freq: Four times a day (QID) | ORAL | Status: DC | PRN

## 2019-06-17 MED ORDER — ONDANSETRON HCL 4 MG/2ML IJ SOLN: 4.0000 mg | Freq: Four times a day (QID) | INTRAMUSCULAR | Status: DC | PRN

## 2019-06-17 MED ORDER — IOHEXOL 350 MG/ML SOLN
75.0000 mL | Freq: Once | INTRAVENOUS | Status: AC | PRN
  Administered 2019-06-17: 21:00:00 75 mL via INTRAVENOUS

## 2019-06-17 MED ORDER — BISACODYL 5 MG PO TBEC
10.0000 mg | DELAYED_RELEASE_TABLET | Freq: Once | ORAL | Status: AC
  Administered 2019-06-18: 10 mg via ORAL
  Filled 2019-06-17: qty 2

## 2019-06-17 MED ORDER — ACETAMINOPHEN 325 MG PO TABS
650.0000 mg | ORAL_TABLET | Freq: Four times a day (QID) | ORAL | Status: DC | PRN
  Administered 2019-06-19: 06:00:00 650 mg via ORAL
  Filled 2019-06-17: qty 2

## 2019-06-17 MED ORDER — ACETAMINOPHEN 650 MG RE SUPP: 650.0000 mg | Freq: Four times a day (QID) | RECTAL | Status: DC | PRN

## 2019-06-17 MED ORDER — POLYETHYLENE GLYCOL 3350 17 G PO PACK: 17.0000 g | PACK | Freq: Every day | ORAL | Status: DC | PRN

## 2019-06-17 MED ORDER — ENOXAPARIN SODIUM 40 MG/0.4ML ~~LOC~~ SOLN
40.0000 mg | SUBCUTANEOUS | Status: DC
  Administered 2019-06-17: 23:00:00 40 mg via SUBCUTANEOUS
  Filled 2019-06-17: qty 0.4

## 2019-06-17 MED ORDER — DOCUSATE SODIUM 100 MG PO CAPS
100.0000 mg | ORAL_CAPSULE | Freq: Two times a day (BID) | ORAL | Status: DC
  Administered 2019-06-18: 10:00:00 100 mg via ORAL
  Filled 2019-06-17: qty 1

## 2019-06-17 MED ORDER — SENNOSIDES-DOCUSATE SODIUM 8.6-50 MG PO TABS
1.0000 | ORAL_TABLET | Freq: Every evening | ORAL | Status: DC | PRN
  Administered 2019-06-18: 1 via ORAL
  Filled 2019-06-17: qty 1

## 2019-06-17 NOTE — ED Notes (Addendum)
Subjective assessment: patient looks extremely weak and tired. Does not stay awake for exam. Wife says this is baseline since patient came to hospital.

## 2019-06-17 NOTE — ED Notes (Signed)
Family walked in [place] to touch base with patient.

## 2019-06-17 NOTE — ED Triage Notes (Addendum)
Reports low calcium from outpatient bloodwork. Reports mild dizziness. Pt alert and oriented X4, cooperative, RR even and unlabored, color WNL. Pt in NAD.  Reports mild SOB.

## 2019-06-17 NOTE — ED Notes (Signed)
Patient is sleeping, wife is at bedside at this time. Wife will stay with him overnight, provided with recliner and warm blankets for comfort. Expresses no needs at this time. Notified to use call bell if anything changes. NP messaged regarding patient's abd CT which shows fecal impaction, asked about stool softener

## 2019-06-17 NOTE — ED Notes (Addendum)
Pt states that he came in for altered blood levels and that he has been feeling increasingly weak. Pt looks malnourished on assessment, pt states he drinks ensures and eats ice cream but otherwise doesn't eat.   Pt denies pain

## 2019-06-17 NOTE — H&P (Signed)
History and Physical    Corey Carr DPO:[id number] DOB: 01/07/1941 DOA: 06/17/2019  PCP: [name], MD   Patient coming from: home  I have personally briefly reviewed patient's old medical records in [place]  Chief Complaint: Weakness, poor appetite and shortness of breath.  I calcium an outpatient  HPI: Corey Carr is a 79 y.o. male with medical history significant for known lung mass x1 month with ongoing unintentional weight loss, suspected lung cancer, COPD and hypertension, with history of COVID-19 pneumonia in 05/08/19, hospitalized in January 2021 with hypercalcemia, dehydration with acute kidney injury, who was sent by his PCP for hypercalcemia of 11.7 after presenting with persistent weakness with poor oral intake and shortness of breath.  Wife at bedside and answers questions.  Patient too lethargic to contribute to history  ED Course: On arrival to the emergency room he was afebrile with a blood pressure of 112/78.  Pulse was 110 O2 sat 100% on room air.  On his blood work he had a white cell count of 19,000, slightly up from a discharge white cell count of 17,000 on his discharge 05/24/2019.  Creatinine was elevated at 1.47 with a baseline of 0.66 on 05/12/2019.  Troponin was 29.  EKG showed sinus tach without acute ST-T wave changes.  Patient had extensive imaging with CT chest and CT abdomen and pelvis.  Difficult findings on imaging showed the left upper lobe mass, pneumo mediastinum without pneumothorax, possible fecal impaction as well as chronic pancreatitis.  Patient was given an IV fluid bolus and hospitalist consulted for admission  review of Systems: Unable to obtain due to weakness and lethargy  Past Medical History:  Diagnosis Date  . Hypertension     Past Surgical History:  Procedure Laterality Date  . KNEE SURGERY Right 1957     reports that he quit smoking about 6 years ago. His smoking use included cigarettes. He has a 30.00 pack-year smoking  history. He has never used smokeless tobacco. He reports that he does not drink alcohol or use drugs.  No Known Allergies  Family History  Problem Relation Age of Onset  . Emphysema Father   . Lung cancer Maternal Uncle      Prior to Admission medications   Medication Sig Start Date End Date Taking? Authorizing Provider  aspirin 325 MG tablet Take 1 tablet by mouth daily.    [provider]  B Complex-C (SUPER B COMPLEX/VITAMIN C) TABS Take 1 tablet by mouth daily.    [provider]  cholecalciferol (VITAMIN D3) 25 MCG (1000 UT) tablet Take 1,000 Units by mouth daily.    [provider]  feeding supplement, ENSURE ENLIVE, (ENSURE ENLIVE) LIQD Take 237 mLs by mouth 3 (three) times daily between meals. 05/12/19   Nicole Kindred A, DO  loratadine (ALLERGY RELIEF) 10 MG tablet Take 1 tablet (10 mg total) by mouth daily. 05/13/19   Ezekiel Slocumb, DO  megestrol (MEGACE) 40 MG tablet Take 40 mg by mouth daily. 05/02/19   [provider]  Multiple Vitamin (MULTIVITAMIN WITH MINERALS) TABS tablet Take 1 tablet by mouth daily. 05/13/19   Nicole Kindred A, DO  omega-3 acid ethyl esters (LOVAZA) 1 g capsule Take 1 g by mouth daily.    [provider]  omeprazole (PRILOSEC) 20 MG capsule Take 20 mg by mouth daily as needed.    [provider]  traMADol (ULTRAM) 50 MG tablet Take 50 mg by mouth every 6 (six) hours as needed.  04/30/19   [provider]  umeclidinium bromide (INCRUSE ELLIPTA) 62.5 MCG/INH AEPB Inhale 1 puff into the lungs daily.    [provider]  vitamin C (ASCORBIC ACID) 250 MG tablet Take 250 mg by mouth daily.    [provider]  vitamin E (VITAMIN E) 400 UNIT capsule Take 400 Units by mouth daily.    [provider]    Physical Exam: Vitals:   06/17/19 2031 06/17/19 2156 06/17/19 2201 06/17/19 2231  BP: 105/72 116/68 121/68 (!) 149/97  Pulse: (!) 50  90 91  Resp: (!) 31 (!) 31 (!)  25 (!) 27  Temp:      TempSrc:      SpO2: 95%  100% 100%  Weight:      Height:         Vitals:   06/17/19 2031 06/17/19 2156 06/17/19 2201 06/17/19 2231  BP: 105/72 116/68 121/68 (!) 149/97  Pulse: (!) 50  90 91  Resp: (!) 31 (!) 31 (!) 25 (!) 27  Temp:      TempSrc:      SpO2: 95%  100% 100%  Weight:      Height:        Constitutional: Cachectic, very lethargic, arousable will answer question but readily falls back asleep  eyes: PERRL, lids and conjunctivae normal ENMT: Mucous membranes are dry Neck: normal, supple, no masses, no thyromegaly Respiratory: clear to auscultation bilaterally, no wheezing, no crackles. Normal respiratory effort. No accessory muscle use.  Cardiovascular: Regular rate and rhythm, no murmurs / rubs / gallops. No extremity edema. 2+ pedal pulses. No carotid bruits.  Abdomen: no tenderness, no masses palpated. No hepatosplenomegaly. Bowel sounds positive.  Musculoskeletal: Muscle wasting  skin: no rashes, lesions, ulcers.  Neurologic: No gross focal neurologic deficit.    Labs on Admission: I have personally reviewed following labs and imaging studies  CBC: Recent Labs  Lab 06/17/19 1511  WBC 19.6*  HGB 11.2*  HCT 33.3*  MCV 80.2  PLT 505*   Basic Metabolic Panel: Recent Labs  Lab 06/17/19 1511 06/17/19 2017  NA 133*  --   K 4.3  --   CL 94*  --   CO2 23  --   GLUCOSE 126*  --   BUN 32*  --   CREATININE 1.47*  --   CALCIUM 12.5*  --   MG  --  2.3  PHOS  --  3.6   GFR: Estimated Creatinine Clearance: 31 mL/min (A) (by C-G formula based on SCr of 1.47 mg/dL (H)). Liver Function Tests: Recent Labs  Lab 06/17/19 1511  AST 20  ALT 16  ALKPHOS 78  BILITOT 1.0  PROT 6.9  ALBUMIN 3.1*   No results for input(s): LIPASE, AMYLASE in the last 168 hours. No results for input(s): AMMONIA in the last 168 hours. Coagulation Profile: No results for input(s): INR, PROTIME in the last 168 hours. Cardiac Enzymes: No results for  input(s): CKTOTAL, CKMB, CKMBINDEX, TROPONINI in the last 168 hours. BNP (last 3 results) No results for input(s): PROBNP in the last 8760 hours. HbA1C: No results for input(s): HGBA1C in the last 72 hours. CBG: No results for input(s): GLUCAP in the last 168 hours. Lipid Profile: No results for input(s): CHOL, HDL, LDLCALC, TRIG, CHOLHDL, LDLDIRECT in the last 72 hours. Thyroid Function Tests: No results for input(s): TSH, T4TOTAL, FREET4, T3FREE, THYROIDAB in the last 72 hours. Anemia Panel: No results for input(s): VITAMINB12, FOLATE, FERRITIN, TIBC, IRON, RETICCTPCT in  the last 72 hours. Urine analysis:    Component Value Date/Time   COLORURINE AMBER (A) 05/23/2019 1540   APPEARANCEUR CLOUDY (A) 05/23/2019 1540   APPEARANCEUR Clear 11/14/2016 1538   LABSPEC 1.017 05/23/2019 1540   PHURINE 5.0 05/23/2019 1540   GLUCOSEU NEGATIVE 05/23/2019 1540   HGBUR NEGATIVE 05/23/2019 1540   BILIRUBINUR NEGATIVE 05/23/2019 1540   BILIRUBINUR Negative 11/14/2016 1538   KETONESUR 5 (A) 05/23/2019 1540   PROTEINUR NEGATIVE 05/23/2019 1540   NITRITE NEGATIVE 05/23/2019 1540   LEUKOCYTESUR NEGATIVE 05/23/2019 1540    Radiological Exams on Admission: DG Chest 2 View  Result Date: 06/17/2019 CLINICAL DATA:  Dizziness and shortness of breath. EXAM: CHEST - 2 VIEW COMPARISON:  05/23/2019. FINDINGS: Similar appearance of volume loss in the left hemithorax with elevation of the left hemidiaphragm and left base collapse/consolidative opacity. There is diffuse interstitial and pleural opacity in the left chest. Right lung is hyperexpanded without focal consolidation or pleural effusion. Bones are diffusely demineralized. IMPRESSION: Largely stable exam. Marked volume loss left hemithorax with posterior collapse/consolidative change in diffuse pleural thickening. Electronically Signed   By: Misty Stanley M.D.   On: 06/17/2019 16:43   CT Angio Chest PE W/Cm &/Or Wo Cm  Result Date: 06/17/2019 CLINICAL  DATA:  Shortness of breath EXAM: CT ANGIOGRAPHY CHEST WITH CONTRAST TECHNIQUE: Multidetector CT imaging of the chest was performed using the standard protocol during bolus administration of intravenous contrast. Multiplanar CT image reconstructions and MIPs were obtained to evaluate the vascular anatomy. CONTRAST:  38mL OMNIPAQUE IOHEXOL 350 MG/ML SOLN COMPARISON:  03/06/2019 FINDINGS: Cardiovascular: No filling defects in the pulmonary arteries to suggest pulmonary emboli. Heart is normal size. Aorta normal caliber. Aortic and coronary artery atherosclerosis. Mediastinum/Nodes: There is pneumomediastinum. Gas seen around the main pulmonary artery and in the posterior mediastinum. No adenopathy. Lungs/Pleura: Volume loss on the left with elevation of the left hemidiaphragm. Findings are chronic. There is a chronic masslike area of consolidation in the left lower lobe which is similar to prior study. Advanced centrilobular emphysema and areas of scarring in both lungs. Possible small left effusion, stable. Upper Abdomen: Imaging into the upper abdomen shows no acute findings. Musculoskeletal: Cachexia.  No acute bony abnormality. Review of the MIP images confirms the above findings. IMPRESSION: Pneumomediastinum seen in the middle and posterior mediastinum. No associated pneumothorax. Advanced centrilobular emphysema. Stable chronic volume loss on the left with elevation of the left hemidiaphragm and masslike consolidation in the left lower lobe. As recommended on prior study, bronchoscopy with attention to the left lower lobe and/or PET CT may be helpful to exclude left lower lobe mass lesion. Small right pleural effusion. Areas of scarring in the lungs bilaterally. Aortic Atherosclerosis (ICD10-I70.0) and Emphysema (ICD10-J43.9). Electronically Signed   By: Rolm Baptise M.D.   On: 06/17/2019 21:12   CT Abdomen Pelvis W Contrast  Result Date: 06/17/2019 CLINICAL DATA:  Hypercalcemia. EXAM: CT ABDOMEN AND PELVIS  WITH CONTRAST TECHNIQUE: Multidetector CT imaging of the abdomen and pelvis was performed using the standard protocol following bolus administration of intravenous contrast. CONTRAST:  87mL OMNIPAQUE IOHEXOL 350 MG/ML SOLN COMPARISON:  None. FINDINGS: Lower chest: Masslike opacity seen in the left lower lobe as described on prior chest CT. Trace left pleural effusion. Volume loss on the left with elevation of the left hemidiaphragm. Advanced emphysema. Scarring in the right lower lobe. Hepatobiliary: No focal hepatic abnormality. Gallbladder unremarkable. Pancreas: Calcifications throughout the pancreas compatible with chronic pancreatitis. No evidence of acute pancreatitis, fall  suspicious pancreatic lesion or pancreatic ductal dilatation. Spleen: No focal abnormality.  Normal size. Adrenals/Urinary Tract: Small cysts in the left kidney. No hydronephrosis. Adrenal glands and urinary bladder unremarkable. Stomach/Bowel: Large stool burden throughout the colon. Distention of the rectum with stool concerning for fecal impaction. Stomach and small bowel decompressed, unremarkable. Vascular/Lymphatic: Aortic atherosclerosis. No enlarged abdominal or pelvic lymph nodes. Reproductive: Prostate prominence. Other: No free fluid or free air. Musculoskeletal: Cachexia. No acute bony abnormality. Degenerative disc and facet disease in the lower lumbar spine. IMPRESSION: Masslike opacity in the left lower lobe as described on chest CT concerning for possible pulmonary mass lesions/malignancy. This could be further evaluated with bronchoscopy and/or PET CT as described on prior chest CT. Advanced emphysema. Large stool burden throughout the colon. Appearance is concerning for fecal impaction. Chronic pancreatitis changes.  No evidence of acute pancreatitis. Aortic atherosclerosis. Electronically Signed   By: Rolm Baptise M.D.   On: 06/17/2019 21:16    EKG: Independently reviewed.   Assessment/Plan Principal Problem:    Hypercalcemia of suspected malignancy Continue with IV hydration currently on 200 mils per hour following an IV fluid bolus in the emergency room Consider pamidronate in view of suspected relation to cancer Consider Lasix going adequate hydration     AKI (acute kidney injury) ([place]) In 1.47 above baseline of 0.88 Likely secondary to poor oral intake related to suspected malignancy Continue IV hydration Monitor renal function and avoid nephrotoxins  Acute metabolic encephalopathy -Patient was very lethargic though arousable -Related to dehydration hypercalcemia -Treat acute conditions -Aspiration precautions.  Speech therapy eval. n.p.o. until more awake    Lung mass, suspect malignancy For pulmonology follow up.  Patient does not have tissue diagnosis    Cancer cachexia ([place]) Protein calorie malnutrition, suspect severe Nutritionist evaluation   Essential hypertension Blood pressure stable, continue home meds once reconciled    COPD with chronic bronchitis ([place]) Not acutely exacerbated Bronchodilator therapy as needed    History of 2019 novel coronavirus disease (COVID-19) Patient was hospitalized in December for Covid pneumonia and completed a remdesivir course Repeat Covid test pending.       DVT prophylaxis: lovenox Code Status: dnr  Family Communication: wife, annie Buccheri  Disposition Plan: Back to previous home environment Consults called: none     Athena Masse MD [facility]     06/17/2019, 10:45 PM

## 2019-06-17 NOTE — ED Provider Notes (Signed)
[facility] Emergency Department Provider Note   ____________________________________________   First MD Initiated Contact with Patient 06/17/19 1956     (approximate)  I have reviewed the triage vital signs and the nursing notes.   HISTORY  Chief Complaint Abnormal Lab    HPI Corey Carr is a 79 y.o. male with past medical history of hypertension, GERD presents to the ED for abnormal labs.  Patient reports that he has been feeling increasingly weak and dizzy over the past couple of weeks along with increasing shortness of breath.  He denies any fever or cough, endorses chest pain but states this feels like he is short winded rather than true pain.  He reports losing almost 50 pounds over the past 3 months and has had significant difficulty swallowing, is only able to tolerate liquids or ice cream.  He was seen at his PCPs office yesterday and lab work then was significant for hypercalcemia.  He was subsequently advised to seek care in the ED for this.  He complains of some difficulty urinating due to his prostate, but denies any dysuria or hematuria.        Past Medical History:  Diagnosis Date  . Hypertension     Patient Active Problem List   Diagnosis Date Noted  . Hypercalcemia of malignancy 06/17/2019  . COPD with chronic bronchitis ([place]) 06/17/2019  . History of 2019 novel coronavirus disease (COVID-19) 06/17/2019  . Cancer cachexia ([place]) 06/17/2019  . Acute kidney injury ([place]) 05/24/2019  . Hyperkalemia 05/23/2019  . COVID-19 virus detected 05/10/2019  . AKI (acute kidney injury) ([place]) 05/09/2019  . Lung mass 05/09/2019  . Essential hypertension 05/09/2019  . Asthma 05/09/2019  . Vitamin B12 deficiency 05/09/2019  . Dehydration 05/09/2019  . Seasonal allergies 05/09/2019  . Failure to thrive (0-17) 05/08/2019    Past Surgical History:  Procedure Laterality Date  . KNEE SURGERY Right 1957    Prior to Admission medications     Medication Sig Start Date End Date Taking? Authorizing Provider  aspirin 325 MG tablet Take 1 tablet by mouth daily.    [provider]  B Complex-C (SUPER B COMPLEX/VITAMIN C) TABS Take 1 tablet by mouth daily.    [provider]  cholecalciferol (VITAMIN D3) 25 MCG (1000 UT) tablet Take 1,000 Units by mouth daily.    [provider]  feeding supplement, ENSURE ENLIVE, (ENSURE ENLIVE) LIQD Take 237 mLs by mouth 3 (three) times daily between meals. 05/12/19   Nicole Kindred A, DO  loratadine (ALLERGY RELIEF) 10 MG tablet Take 1 tablet (10 mg total) by mouth daily. 05/13/19   Ezekiel Slocumb, DO  megestrol (MEGACE) 40 MG tablet Take 40 mg by mouth daily. 05/02/19   [provider]  Multiple Vitamin (MULTIVITAMIN WITH MINERALS) TABS tablet Take 1 tablet by mouth daily. 05/13/19   Nicole Kindred A, DO  omega-3 acid ethyl esters (LOVAZA) 1 g capsule Take 1 g by mouth daily.    [provider]  omeprazole (PRILOSEC) 20 MG capsule Take 20 mg by mouth daily as needed.    [provider]  traMADol (ULTRAM) 50 MG tablet Take 50 mg by mouth every 6 (six) hours as needed. 04/30/19   [provider]  umeclidinium bromide (INCRUSE ELLIPTA) 62.5 MCG/INH AEPB Inhale 1 puff into the lungs daily.    [provider]  vitamin C (ASCORBIC ACID) 250 MG tablet Take 250 mg by mouth daily.    [provider]  vitamin E (VITAMIN E) 400 UNIT capsule Take 400 Units by mouth daily.    [provider]    Allergies Patient has no known allergies.  Family History  Problem Relation Age of Onset  . Emphysema Father   . Lung cancer Maternal Uncle     Social History Social History   Tobacco Use  . Smoking status: Former Smoker    Packs/day: 0.50    Years: 60.00    Pack years: 30.00    Types: Cigarettes    Quit date: 2015    Years since quitting: 6.0  . Smokeless tobacco: Never Used  Substance Use Topics  . Alcohol use:  No  . Drug use: No    Review of Systems  Constitutional: No fever/chills.  Positive for weight loss. Eyes: No visual changes. ENT: No sore throat. Cardiovascular: Positive for chest pain. Respiratory: Positive for shortness of breath. Gastrointestinal: No abdominal pain.  No nausea, no vomiting.  No diarrhea.  No constipation.  Positive for difficulty swallowing. Genitourinary: Negative for dysuria. Musculoskeletal: Negative for back pain. Skin: Negative for rash. Neurological: Negative for headaches, focal weakness or numbness.  ____________________________________________   PHYSICAL EXAM:  VITAL SIGNS: ED Triage Vitals [06/17/19 1509]  Enc Vitals Group     BP 111/72     Pulse Rate 100     Resp 18     Temp 97.7 F (36.5 C)     Temp Source Oral     SpO2 98 %     Weight 116 lb 13.5 oz (53 kg)     Height 5\' 8"  (1.727 m)     Head Circumference      Peak Flow      Pain Score 0     Pain Loc      Pain Edu?      Excl. in [place]?     Constitutional: Alert and oriented.  Very thin appearing. Eyes: Conjunctivae are normal. Head: Atraumatic. Nose: No congestion/rhinnorhea. Mouth/Throat: Mucous membranes are moist. Neck: Normal ROM Cardiovascular: Tachycardic, regular rhythm. Grossly normal heart sounds. Respiratory: Tachypneic with normal respiratory effort.  No retractions. Lungs CTAB. Gastrointestinal: Soft and nontender. No distention. Genitourinary: deferred Musculoskeletal: No lower extremity tenderness nor edema. Neurologic:  Normal speech and language. No gross focal neurologic deficits are appreciated. Skin:  Skin is warm, dry and intact. No rash noted. Psychiatric: Mood and affect are normal. Speech and behavior are normal.  ____________________________________________   LABS (all labs ordered are listed, but only abnormal results are displayed)  Labs Reviewed  CBC - Abnormal; Notable for the following components:      Result Value   WBC 19.6 (*)    RBC  4.15 (*)    Hemoglobin 11.2 (*)    HCT 33.3 (*)    RDW 16.7 (*)    Platelets 710 (*)    All other components within normal limits  COMPREHENSIVE METABOLIC PANEL - Abnormal; Notable for the following components:   Sodium 133 (*)    Chloride 94 (*)    Glucose, Bld 126 (*)    BUN 32 (*)    Creatinine, Ser 1.47 (*)    Calcium 12.5 (*)    Albumin 3.1 (*)    GFR calc non Af Amer 45 (*)    GFR calc Af Amer 52 (*)    Anion gap 16 (*)    All other components within normal limits  URINALYSIS, COMPLETE (UACMP) WITH MICROSCOPIC - Abnormal; Notable for the following components:  Color, Urine AMBER (*)    APPearance HAZY (*)    Specific Gravity, Urine 1.038 (*)    Leukocytes,Ua TRACE (*)    All other components within normal limits  TROPONIN I (HIGH SENSITIVITY) - Abnormal; Notable for the following components:   Troponin I (High Sensitivity) 29 (*)    All other components within normal limits  SARS CORONAVIRUS 2 (TAT 6-24 HRS)  MAGNESIUM  PHOSPHORUS  CALCIUM, IONIZED  CBC  CREATININE, SERUM  BASIC METABOLIC PANEL  CBC   ____________________________________________  EKG  ED ECG REPORT I, Blake Divine, the attending physician, personally viewed and interpreted this ECG.   Date: 06/17/2019  EKG Time: 15:13  Rate: 106  Rhythm: sinus tachycardia  Axis: Normal  Intervals:left anterior fascicular block  ST&T Change: None   PROCEDURES  Procedure(s) performed (including Critical Care):  Procedures   ____________________________________________   INITIAL IMPRESSION / ASSESSMENT AND PLAN / ED COURSE       79 year old male presents to the ED for increasing weakness, weight loss, and shortness of breath along with very poor appetite and difficulty swallowing over the past couple of months.  He was seen at his PCPs office yesterday and found to have hypercalcemia, subsequently referred to the ED for further evaluation.  He arrives in the ED tachycardic and tachypneic, but  not in any respiratory distress.  Given his hypercalcemia, I would be most concerned for malignancy, which would also put him at risk for pulmonary embolus.  Plan to start hydration with IV fluids in order to improve his calcium, check CTA chest for PE as well as CT abdomen pelvis for potential malignancy.  EKG shows no evidence of ischemia or arrhythmia, low suspicion for ACS but will screen troponin.  Patient also noted to have leukocytosis but infectious process seems less likely, chest x-ray negative for acute process and UA pending.  CTA of chest is negative for PE, but does show masslike consolidation in the left lower lobe as well as pneumomediastinum.  Patient remains reasonably well-appearing without significant pain, doubt acute violation of his esophagus such as Boerhaave's.  Pneumomediastinum would be appropriate for observation and may be related to defect in his bronchus due to mass.  Case was discussed with Dr. Grayland Ormond of hematology oncology, and although patient is stable at this time, admission seems warranted to monitor for improvement in his hypercalcemia as well as pneumomediastinum.  Case discussed with hospitalist, who accepts patient for admission.      ____________________________________________   FINAL CLINICAL IMPRESSION(S) / ED DIAGNOSES  Final diagnoses:  Hypercalcemia  Lung mass  Pneumomediastinum [facility])     ED Discharge Orders    None       Note:  This document was prepared using Dragon voice recognition software and may include unintentional dictation errors.   Blake Divine, MD 06/17/19 2300

## 2019-06-18 ENCOUNTER — Encounter: Payer: Self-pay | Admitting: Internal Medicine

## 2019-06-18 ENCOUNTER — Ambulatory Visit: Admission: RE | Admit: 2019-06-18 | Payer: Medicare Other | Source: Ambulatory Visit

## 2019-06-18 DIAGNOSIS — G9341 Metabolic encephalopathy: Secondary | ICD-10-CM

## 2019-06-18 DIAGNOSIS — E43 Unspecified severe protein-calorie malnutrition: Secondary | ICD-10-CM | POA: Insufficient documentation

## 2019-06-18 LAB — CBC
HCT: 27.7 % — ABNORMAL LOW (ref 39.0–52.0)
HCT: 27.8 % — ABNORMAL LOW (ref 39.0–52.0)
Hemoglobin: 9.2 g/dL — ABNORMAL LOW (ref 13.0–17.0)
Hemoglobin: 9.3 g/dL — ABNORMAL LOW (ref 13.0–17.0)
MCH: 26.4 pg (ref 26.0–34.0)
MCH: 26.9 pg (ref 26.0–34.0)
MCHC: 33.2 g/dL (ref 30.0–36.0)
MCHC: 33.5 g/dL (ref 30.0–36.0)
MCV: 79.6 fL — ABNORMAL LOW (ref 80.0–100.0)
MCV: 80.3 fL (ref 80.0–100.0)
Platelets: 565 10*3/uL — ABNORMAL HIGH (ref 150–400)
Platelets: 585 10*3/uL — ABNORMAL HIGH (ref 150–400)
RBC: 3.46 MIL/uL — ABNORMAL LOW (ref 4.22–5.81)
RBC: 3.48 MIL/uL — ABNORMAL LOW (ref 4.22–5.81)
RDW: 16.5 % — ABNORMAL HIGH (ref 11.5–15.5)
RDW: 17.2 % — ABNORMAL HIGH (ref 11.5–15.5)
WBC: 16.5 10*3/uL — ABNORMAL HIGH (ref 4.0–10.5)
WBC: 17.5 10*3/uL — ABNORMAL HIGH (ref 4.0–10.5)
nRBC: 0 % (ref 0.0–0.2)
nRBC: 0 % (ref 0.0–0.2)

## 2019-06-18 LAB — BASIC METABOLIC PANEL
Anion gap: 6 (ref 5–15)
BUN: 32 mg/dL — ABNORMAL HIGH (ref 8–23)
CO2: 27 mmol/L (ref 22–32)
Calcium: 11.2 mg/dL — ABNORMAL HIGH (ref 8.9–10.3)
Chloride: 102 mmol/L (ref 98–111)
Creatinine, Ser: 1.18 mg/dL (ref 0.61–1.24)
GFR calc Af Amer: >60 mL/min (ref >60)
GFR calc non Af Amer: 59 mL/min — ABNORMAL LOW (ref >60)
Glucose, Bld: 81 mg/dL (ref 70–99)
Potassium: 4.5 mmol/L (ref 3.5–5.1)
Sodium: 135 mmol/L (ref 135–145)

## 2019-06-18 LAB — CREATININE, SERUM
Creatinine, Ser: 1.27 mg/dL — ABNORMAL HIGH (ref 0.61–1.24)
GFR calc Af Amer: >60 mL/min (ref >60)
GFR calc non Af Amer: 54 mL/min — ABNORMAL LOW (ref >60)

## 2019-06-18 LAB — SARS CORONAVIRUS 2 (TAT 6-24 HRS): SARS Coronavirus 2: NEGATIVE

## 2019-06-18 MED ORDER — LORATADINE 10 MG PO TABS
10.0000 mg | ORAL_TABLET | Freq: Every day | ORAL | Status: DC
  Administered 2019-06-18 – 2019-06-19 (×2): 10 mg via ORAL
  Filled 2019-06-18 (×2): qty 1

## 2019-06-18 MED ORDER — PANTOPRAZOLE SODIUM 40 MG PO TBEC
40.0000 mg | DELAYED_RELEASE_TABLET | Freq: Every day | ORAL | Status: DC
  Administered 2019-06-18 – 2019-06-19 (×2): 40 mg via ORAL
  Filled 2019-06-18 (×2): qty 1

## 2019-06-18 MED ORDER — POLYETHYLENE GLYCOL 3350 17 G PO PACK: 17.0000 g | PACK | Freq: Every day | ORAL | Status: DC

## 2019-06-18 MED ORDER — BISACODYL 10 MG RE SUPP: 10.0000 mg | Freq: Every day | RECTAL | Status: DC | PRN

## 2019-06-18 MED ORDER — ALBUTEROL SULFATE (2.5 MG/3ML) 0.083% IN NEBU: 2.5000 mg | INHALATION_SOLUTION | RESPIRATORY_TRACT | Status: DC | PRN

## 2019-06-18 MED ORDER — HYDROCODONE-ACETAMINOPHEN 5-325 MG PO TABS: 1.0000 | ORAL_TABLET | Freq: Four times a day (QID) | ORAL | Status: DC | PRN

## 2019-06-18 MED ORDER — FLUTICASONE FUROATE-VILANTEROL 100-25 MCG/INH IN AEPB
1.0000 | INHALATION_SPRAY | Freq: Every day | RESPIRATORY_TRACT | Status: DC
  Administered 2019-06-18 – 2019-06-19 (×2): 1 via RESPIRATORY_TRACT
  Filled 2019-06-18: qty 28

## 2019-06-18 MED ORDER — SENNOSIDES-DOCUSATE SODIUM 8.6-50 MG PO TABS
1.0000 | ORAL_TABLET | Freq: Two times a day (BID) | ORAL | Status: DC
  Administered 2019-06-18 – 2019-06-19 (×3): 1 via ORAL
  Filled 2019-06-18 (×3): qty 1

## 2019-06-18 MED ORDER — FLUTICASONE-UMECLIDIN-VILANT 100-62.5-25 MCG/INH IN AEPB: 1.0000 | INHALATION_SPRAY | Freq: Every day | RESPIRATORY_TRACT | Status: DC

## 2019-06-18 MED ORDER — ENOXAPARIN SODIUM 30 MG/0.3ML ~~LOC~~ SOLN
30.0000 mg | SUBCUTANEOUS | Status: DC
  Administered 2019-06-18: 21:00:00 30 mg via SUBCUTANEOUS
  Filled 2019-06-18: qty 0.3

## 2019-06-18 MED ORDER — ZOLEDRONIC ACID 4 MG/5ML IV CONC
4.0000 mg | Freq: Once | INTRAVENOUS | Status: AC
  Administered 2019-06-18: 10:00:00 4 mg via INTRAVENOUS
  Filled 2019-06-18: qty 5

## 2019-06-18 MED ORDER — UMECLIDINIUM BROMIDE 62.5 MCG/INH IN AEPB
1.0000 | INHALATION_SPRAY | Freq: Every day | RESPIRATORY_TRACT | Status: DC
  Administered 2019-06-18 – 2019-06-19 (×2): 1 via RESPIRATORY_TRACT
  Filled 2019-06-18: qty 7

## 2019-06-18 MED ORDER — ASPIRIN 325 MG PO TABS
325.0000 mg | ORAL_TABLET | Freq: Every day | ORAL | Status: DC
  Administered 2019-06-18 – 2019-06-19 (×2): 325 mg via ORAL
  Filled 2019-06-18 (×2): qty 1

## 2019-06-18 MED ORDER — ENSURE ENLIVE PO LIQD
237.0000 mL | Freq: Three times a day (TID) | ORAL | Status: DC
  Administered 2019-06-18 – 2019-06-19 (×3): 237 mL via ORAL

## 2019-06-18 MED ORDER — MEGESTROL ACETATE 400 MG/10ML PO SUSP
400.0000 mg | Freq: Every day | ORAL | Status: DC
  Administered 2019-06-18 – 2019-06-19 (×2): 400 mg via ORAL
  Filled 2019-06-18 (×2): qty 10

## 2019-06-18 MED ORDER — MEGESTROL ACETATE 20 MG PO TABS
40.0000 mg | ORAL_TABLET | Freq: Every day | ORAL | Status: DC
  Filled 2019-06-18: qty 2

## 2019-06-18 MED ORDER — ALBUTEROL SULFATE HFA 108 (90 BASE) MCG/ACT IN AERS: 2.0000 | INHALATION_SPRAY | RESPIRATORY_TRACT | Status: DC

## 2019-06-18 NOTE — Progress Notes (Signed)
Admitted. Patient weak. Oozing stool. Sacral redness noted, cleaned and foam applied.

## 2019-06-18 NOTE — Evaluation (Signed)
Occupational Therapy Evaluation Patient Details Name: Corey Carr MRN: [id number] DOB: [birth year] Today's Date: 06/18/2019    History of Present Illness Corey Carr is a 79 y.o. male with medical history significant for known lung mass x1 month with ongoing unintentional weight loss, suspected lung cancer, COPD and hypertension, with history of COVID-19 pneumonia in 05/08/19, hospitalized in January 2021 with hypercalcemia, dehydration with acute kidney injury, who was sent by his PCP for hypercalcemia of 11.7 after presenting with persistent weakness with poor oral intake and shortness of breath.  Upon further evaluation he was noted to have a left lower lobe lung mass highly suspicious for underlying malignancy.   Clinical Impression   Corey Carr was seen for OT evaluation this date. Pt received semi-reclined in bed with wife present at bedside. Prior to hospital admission, pt was receiving assistance from his wife for ADL management including bathing and toileting. Pt endorses that since his COVID-19 diagnosis in December, he has been much weaker only ambulates to his bathroom. Otherwise he remains fairly sedentary due to weakness and fatigue. Pt lives with his wife in a 1 level mobile home with 4 steps to enter. Currently pt demonstrates impairments as described below (See OT problem list) which functionally limit her ability to perform ADL/self-care tasks. Pt requires minimal to moderate assistance for heavy ADL management including lower body dressing and bathing.  Pt would benefit from skilled OT to address noted impairments and functional limitations (see below for any additional details) in order to maximize safety and independence while minimizing falls risk and caregiver burden. Upon hospital discharge, recommend HHOT to maximize pt safety and return to functional independence during meaningful occupations of daily life.    Follow Up Recommendations  Home health OT    Equipment  Recommendations  3 in 1 bedside commode    Recommendations for Other Services       Precautions / Restrictions Precautions Precautions: Fall Precaution Comments: High Fall Restrictions Weight Bearing Restrictions: No      Mobility Bed Mobility               General bed mobility comments: Deferred. Pt semi-reclined in bed. Adamantly declines any mobility this date despite encouragement from therapist. Pt states he becomes too SOB when he tries to move. When encouraged to trial ECS, pt declines stating he has had a BM and would like to be cleaned up first. Declines to participate in clean-up with this therapist.  Transfers                 General transfer comment: Deferred. See above.    Balance Overall balance assessment: Mild deficits observed, not formally tested                                         ADL either performed or assessed with clinical judgement   ADL Overall ADL's : Needs assistance/impaired                                       General ADL Comments: Pt req moderate assist for LB ADL management per clinical judgement. Pt declines to participate in any functional mobility or ADL tasks during evaluation. C/o significant shortness of breath with exertion and incontience of bowel. Pt declines to allow this author to assist with clean-up. Pt  wife at bedside indicates pt requires set-up assist for peri-care, LB dressing, and bathing. She provides some physical assistance when pt feels weak/SOB.     Vision Baseline Vision/History: Wears glasses Wears Glasses: Reading only Patient Visual Report: No change from baseline       Perception     Praxis      Pertinent Vitals/Pain Pain Assessment: No/denies pain(C/o SOB) Pain Intervention(s): Limited activity within patient's tolerance;Monitored during session     Hand Dominance Right   Extremity/Trunk Assessment Upper Extremity Assessment Upper Extremity Assessment:  Generalized weakness   Lower Extremity Assessment Lower Extremity Assessment: Generalized weakness;Defer to PT evaluation       Communication Communication Communication: No difficulties   Cognition Arousal/Alertness: Awake/alert Behavior During Therapy: WFL for tasks assessed/performed Overall Cognitive Status: Within Functional Limits for tasks assessed                                     General Comments       Exercises Other Exercises Other Exercises: Pt educated in safe use of AE/DME for ADL mgt, falls prevention strategies, importance of mobility during hospitalization, importance of monitoring O2 with mobility, and role of OT in acute care setting.   Shoulder Instructions      Home Living Family/patient expects to be discharged to:: Private residence   Available Help at Discharge: Family;Available 24 hours/day(Wife has medical concerns of her own, on HD. Does assist pt with ADL mgt.) Type of Home: Mobile home Home Access: Stairs to enter Entrance Stairs-Number of Steps: 4 Entrance Stairs-Rails: Left;Right Home Layout: One level     Bathroom Shower/Tub: Occupational psychologist: Standard     Home Equipment: Environmental consultant - 2 wheels;Walker - 4 wheels;Cane - single point;Toilet riser;Grab bars - tub/shower          Prior Functioning/Environment Level of Independence: Independent with assistive device(s)                 OT Problem List: Decreased strength;Cardiopulmonary status limiting activity;Decreased coordination;Decreased activity tolerance;Decreased safety awareness;Impaired balance (sitting and/or standing);Decreased cognition;Decreased knowledge of use of DME or AE      OT Treatment/Interventions: Self-care/ADL training;Therapeutic exercise;Therapeutic activities;DME and/or AE instruction;Patient/family education;Balance training    OT Goals(Current goals can be found in the care plan section) Acute Rehab OT Goals Patient Stated  Goal: To go home OT Goal Formulation: With patient Time For Goal Achievement: 07/02/19 Potential to Achieve Goals: Good ADL Goals Pt Will Perform Grooming: sitting;with caregiver independent in assisting;with supervision;with set-up Pt Will Perform Upper Body Dressing: sitting;with supervision;with set-up;with caregiver independent in assisting Pt Will Perform Lower Body Dressing: sit to/from stand;with min assist;with caregiver independent in assisting;with adaptive equipment(LRAD PRN for safety) Additional ADL Goal #1: Pt will independently verbalize a plan to implement at least 3 learned energy conservation strategies into his daily routines/home environment for improved safety and funcitonal independence upon hospital DC.  OT Frequency: Min 2X/week   Barriers to D/C: Inaccessible home environment;Decreased caregiver support          Co-evaluation              AM-PAC OT "6 Clicks" Daily Activity     Outcome Measure Help from another person eating meals?: None Help from another person taking care of personal grooming?: A Little Help from another person toileting, which includes using toliet, bedpan, or urinal?: A Little Help from another person bathing (  including washing, rinsing, drying)?: A Lot Help from another person to put on and taking off regular upper body clothing?: A Little Help from another person to put on and taking off regular lower body clothing?: A Little 6 Click Score: 18   End of Session Nurse Communication: Other (comment)(Pt requesting clean-up.)  Activity Tolerance: Treatment limited secondary to medical complications (Comment);Patient limited by fatigue Patient left: in bed;with call bell/phone within reach;with bed alarm set;with family/visitor present  OT Visit Diagnosis: Other abnormalities of gait and mobility (R26.89);Muscle weakness (generalized) (M62.81)                Time: 1497-0263 OT Time Calculation (min): 17 min Charges:  OT General  Charges $OT Visit: 1 Visit OT Evaluation $OT Eval Moderate Complexity: 1 Mod OT Treatments $Self Care/Home Management : 8-22 mins  Shara Blazing, M.S., OTR/L Ascom: [phone] 06/18/19, 3:10 PM

## 2019-06-18 NOTE — Evaluation (Signed)
Physical Therapy Evaluation Patient Details Name: Corey Carr MRN: [id number] DOB: [birth year] Today's Date: 06/18/2019   History of Present Illness  Corey Carr is a 79 y.o. male with medical history significant for known lung mass x1 month with ongoing unintentional weight loss, suspected lung cancer, COPD and hypertension, with history of COVID-19 pneumonia in 05/08/19, hospitalized in January 2021 with hypercalcemia, dehydration with acute kidney injury, who was sent by his PCP for hypercalcemia of 11.7 after presenting with persistent weakness with poor oral intake and shortness of breath.  Upon further evaluation he was noted to have a left lower lobe lung mass highly suspicious for underlying malignancy.  Clinical Impression  Pt admitted with above diagnosis. Pt currently with functional limitations due to the deficits listed below (see "PT Problem List"). Upon entry, pt in bed, awake and agreeable to participate, wife at bedside. The pt is alert and oriented x4, pleasant, conversational, and generally a good historian. Modified independent mobility to EOB, minA to come to standing with RW, pt able to AMB around the unit at a steady pace, speed appropriate for household distance AMB. Functional mobility assessment demonstrates increased effort/time requirements, poor tolerance, and need for physical assistance, whereas the patient performed these at a higher level of independence PTA. Pt fatigued at end of session. Pt will benefit from skilled PT intervention to increase independence and safety with basic mobility in preparation for discharge to the venue listed below.       Follow Up Recommendations Home health PT;Supervision - Intermittent;Supervision for mobility/OOB    Equipment Recommendations  Rolling walker with 5" wheels    Recommendations for Other Services       Precautions / Restrictions Precautions Precautions: Fall Precaution Comments: High Fall Restrictions Weight  Bearing Restrictions: No      Mobility  Bed Mobility Overal bed mobility: Modified Independent             General bed mobility comments: Deferred. Pt semi-reclined in bed. Adamantly declines any mobility this date despite encouragement from therapist. Pt states he becomes too SOB when he tries to move. When encouraged to trial ECS, pt declines stating he has had a BM and would like to be cleaned up first. Declines to participate in clean-up with this therapist.  Transfers Overall transfer level: Needs assistance Equipment used: Rolling walker (2 wheeled) Transfers: Sit to/from Stand Sit to Stand: Min guard;Min assist         General transfer comment: Weak, labored, but able to rise with only minA, pt stands with RW for 60sec for author to affix a diaper.  Ambulation/Gait   Gait Distance (Feet): 200 Feet Assistive device: Rolling walker (2 wheeled) Gait Pattern/deviations: WFL(Within Functional Limits) Gait velocity: 0.89m/s   General Gait Details: appears generally steady and safe c RW, consistent pacing, HR 110s. Pt reports some mild SOB at end.  Stairs            Wheelchair Mobility    Modified Rankin (Stroke Patients Only)       Balance Overall balance assessment: Mild deficits observed, not formally tested;Modified Independent                                           Pertinent Vitals/Pain Pain Assessment: Faces Faces Pain Scale: Hurts even more Pain Location: Rectal pain from chronic diarrhea. Pain Intervention(s): Limited activity within patient's tolerance;Monitored during session  Home Living Family/patient expects to be discharged to:: Private residence   Available Help at Discharge: Family;Available 24 hours/day(Wife has medical concerns of her own, on HD. Does assist pt with ADL mgt.) Type of Home: Mobile home Home Access: Stairs to enter Entrance Stairs-Rails: Chemical engineer of Steps: 4 Home  Layout: One level Home Equipment: Walker - 2 wheels;Walker - 4 wheels;Cane - single point;Toilet riser;Grab bars - tub/shower      Prior Function Level of Independence: Independent with assistive device(s)               Hand Dominance   Dominant Hand: Right    Extremity/Trunk Assessment   Upper Extremity Assessment Upper Extremity Assessment: Generalized weakness;Overall [facility] for tasks assessed    Lower Extremity Assessment Lower Extremity Assessment: Generalized weakness;Overall WFL for tasks assessed       Communication   Communication: No difficulties  Cognition Arousal/Alertness: Awake/alert Behavior During Therapy: WFL for tasks assessed/performed Overall Cognitive Status: Within Functional Limits for tasks assessed                                        General Comments      Exercises Other Exercises Other Exercises: Pt educated in safe use of AE/DME for ADL mgt, falls prevention strategies, importance of mobility during hospitalization, importance of monitoring O2 with mobility, and role of OT in acute care setting.   Assessment/Plan    PT Assessment Patient needs continued PT services  PT Problem List Decreased strength;Decreased activity tolerance;Decreased balance;Decreased mobility       PT Treatment Interventions DME instruction;Balance training;Gait training;Functional mobility training;Therapeutic activities;Therapeutic exercise;Patient/family education    PT Goals (Current goals can be found in the Care Plan section)  Acute Rehab PT Goals Patient Stated Goal: To go home PT Goal Formulation: With patient Time For Goal Achievement: 07/02/19 Potential to Achieve Goals: Good    Frequency Min 2X/week   Barriers to discharge Decreased caregiver support wife is out for HD 3d/week    Co-evaluation               AM-PAC PT "6 Clicks" Mobility  Outcome Measure Help needed turning from your back to your side while in a flat  bed without using bedrails?: None Help needed moving from lying on your back to sitting on the side of a flat bed without using bedrails?: None Help needed moving to and from a bed to a chair (including a wheelchair)?: A Little Help needed standing up from a chair using your arms (e.g., wheelchair or bedside chair)?: A Little Help needed to walk in hospital room?: A Little Help needed climbing 3-5 steps with a railing? : A Little 6 Click Score: 20    End of Session Equipment Utilized During Treatment: Gait belt Activity Tolerance: Patient tolerated treatment well;Patient limited by fatigue Patient left: in bed;with family/visitor present;with bed alarm set;with call bell/phone within reach Nurse Communication: Mobility status PT Visit Diagnosis: Difficulty in walking, not elsewhere classified (R26.2);Muscle weakness (generalized) (M62.81);Unsteadiness on feet (R26.81)    Time: 3244-0102 PT Time Calculation (min) (ACUTE ONLY): 23 min   Charges:   PT Evaluation $PT Eval Moderate Complexity: 1 Mod PT Treatments $Therapeutic Exercise: 8-22 mins        4:57 PM, 06/18/19 Etta Grandchild, PT, DPT Physical Therapist - [facility]  [phone] ([place])    Angle Dirusso C 06/18/2019,  4:56 PM

## 2019-06-18 NOTE — Consult Note (Signed)
[place]  [phone] [phone]  ID: Cambridge Deleo OB: January 03, 1941  MR#: [id number]  ZWC#:[id number]  Patient Care Team: [name], MD as PCP - General (Family Medicine)  CHIEF COMPLAINT: Left lower lobe lung mass, hypercalcemia of malignancy.  INTERVAL HISTORY: Patient is a 79 year old male with a recent history of unintentional weight loss, declining performance status, and persistent weakness and fatigue.  He was noted to have hypercalcemia and sent to the emergency room for admission.  Upon further evaluation he was noted to have a left lower lobe lung mass highly suspicious for underlying malignancy.  Much of the history is given by his wife.  He has no neurologic complaints.  He denies any recent fevers.  He has no chest pain, shortness of breath, cough, or hemoptysis.  He denies any nausea, vomiting, constipation, or diarrhea.  He has no urinary complaints.  Patient offers no further specific complaints today.  REVIEW OF SYSTEMS:   Review of Systems  Constitutional: Positive for malaise/fatigue and weight loss. Negative for fever.  Respiratory: Negative.  Negative for cough, hemoptysis and shortness of breath.   Cardiovascular: Negative.  Negative for chest pain and leg swelling.  Gastrointestinal: Negative.  Negative for abdominal pain.  Genitourinary: Negative.  Negative for dysuria.  Musculoskeletal: Negative.  Negative for back pain.  Skin: Negative.  Negative for rash.  Neurological: Positive for weakness. Negative for dizziness, focal weakness and headaches.  Psychiatric/Behavioral: Negative.  The patient is not nervous/anxious.     As per HPI. Otherwise, a complete review of systems is negative.  PAST MEDICAL HISTORY: Past Medical History:  Diagnosis Date  . Hypertension     PAST SURGICAL HISTORY: Past Surgical History:  Procedure Laterality Date  . KNEE SURGERY Right 1957    FAMILY HISTORY: Family History    Problem Relation Age of Onset  . Emphysema Father   . Lung cancer Maternal Uncle     ADVANCED DIRECTIVES (Y/N):  @ADVDIR @  HEALTH MAINTENANCE: Social History   Tobacco Use  . Smoking status: Former Smoker    Packs/day: 0.50    Years: 60.00    Pack years: 30.00    Types: Cigarettes    Quit date: 2015    Years since quitting: 6.0  . Smokeless tobacco: Never Used  Substance Use Topics  . Alcohol use: No  . Drug use: No     Colonoscopy:  PAP:  Bone density:  Lipid panel:  No Known Allergies  Current Facility-Administered Medications  Medication Dose Route Frequency Provider Last Rate Last Admin  . acetaminophen (TYLENOL) tablet 650 mg  650 mg Oral Q6H PRN Athena Masse, MD       Or  . acetaminophen (TYLENOL) suppository 650 mg  650 mg Rectal Q6H PRN Athena Masse, MD      . albuterol (PROVENTIL) (2.5 MG/3ML) 0.083% nebulizer solution 2.5 mg  2.5 mg Nebulization Q4H PRN Rito Ehrlich A, RPH      . aspirin tablet 325 mg  325 mg Oral Daily Sreenath, Sudheer B, MD      . bisacodyl (DULCOLAX) suppository 10 mg  10 mg Rectal Daily PRN Sreenath, Sudheer B, MD      . docusate sodium (COLACE) capsule 100 mg  100 mg Oral BID Lang Snow, NP   100 mg at 06/18/19 0958  . enoxaparin (LOVENOX) injection 40 mg  40 mg Subcutaneous Q24H Athena Masse, MD   40 mg at 06/17/19 2307  . feeding supplement (ENSURE  ENLIVE) (ENSURE ENLIVE) liquid 237 mL  237 mL Oral TID BM Sreenath, Sudheer B, MD      . Fluticasone-Umeclidin-Vilant 100-62.5-25 MCG/INH AEPB 1 puff  1 puff Inhalation Daily Sreenath, Sudheer B, MD      . HYDROcodone-acetaminophen (NORCO/VICODIN) 5-325 MG per tablet 1 tablet  1 tablet Oral Q6H PRN Priscella Mann, Sudheer B, MD      . loratadine (CLARITIN) tablet 10 mg  10 mg Oral Daily Sreenath, Sudheer B, MD      . megestrol (MEGACE) 400 MG/10ML suspension 400 mg  400 mg Oral Daily Sreenath, Sudheer B, MD      . ondansetron (ZOFRAN) tablet 4 mg  4 mg Oral Q6H PRN Athena Masse, MD       Or  . ondansetron (ZOFRAN) injection 4 mg  4 mg Intravenous Q6H PRN Athena Masse, MD      . pantoprazole (PROTONIX) EC tablet 40 mg  40 mg Oral Daily Sreenath, Sudheer B, MD      . polyethylene glycol (MIRALAX / GLYCOLAX) packet 17 g  17 g Oral Daily PRN Ouma, Bing Neighbors, NP      . polyethylene glycol (MIRALAX / GLYCOLAX) packet 17 g  17 g Oral Daily Sreenath, Sudheer B, MD      . senna-docusate (Senokot-S) tablet 1 tablet  1 tablet Oral BID Ralene Muskrat B, MD        OBJECTIVE: Vitals:   06/18/19 0100 06/18/19 0220  BP: 131/73 139/80  Pulse: 90 91  Resp: (!) 22 18  Temp:  97.9 F (36.6 C)  SpO2: 100% 99%     Body mass index is 14.18 kg/m.    ECOG FS:2 - Symptomatic, <50% confined to bed  General: Thin, no acute distress. Eyes: Pink conjunctiva, anicteric sclera. HEENT: Normocephalic, moist mucous membranes. Lungs: No audible wheezing or coughing. Heart: Regular rate and rhythm. Abdomen: Soft, nontender, no obvious distention. Musculoskeletal: No edema, cyanosis, or clubbing. Neuro: Alert, answering all questions appropriately. Cranial nerves grossly intact. Skin: No rashes or petechiae noted. Psych: Normal affect. Lymphatics: No cervical, calvicular, axillary or inguinal LAD.   LAB RESULTS:  Lab Results  Component Value Date   NA 135 06/18/2019   K 4.5 06/18/2019   CL 102 06/18/2019   CO2 27 06/18/2019   GLUCOSE 81 06/18/2019   BUN 32 (H) 06/18/2019   CREATININE 1.18 06/18/2019   CALCIUM 11.2 (H) 06/18/2019   PROT 6.9 06/17/2019   ALBUMIN 3.1 (L) 06/17/2019   AST 20 06/17/2019   ALT 16 06/17/2019   ALKPHOS 78 06/17/2019   BILITOT 1.0 06/17/2019   GFRNONAA 59 (L) 06/18/2019   GFRAA >60 06/18/2019    Lab Results  Component Value Date   WBC 16.5 (H) 06/18/2019   NEUTROABS 14.3 (H) 05/23/2019   HGB 9.2 (L) 06/18/2019   HCT 27.7 (L) 06/18/2019   MCV 79.6 (L) 06/18/2019   PLT 585 (H) 06/18/2019     STUDIES: DG Chest 1  View  Result Date: 05/23/2019 CLINICAL DATA:  History of recent COVID-19 EXAM: CHEST  1 VIEW COMPARISON:  05/08/2019 FINDINGS: Cardiac shadows within normal limits. Right lung demonstrates some mild interstitial changes without focal infiltrate. Chronic volume loss in the left base is noted similar to that seen on prior exam. IMPRESSION: Chronic changes in the left base.  No acute infiltrate is seen. Electronically Signed   By: Inez Catalina M.D.   On: 05/23/2019 13:32   DG Chest 2 View  Result Date:  06/17/2019 CLINICAL DATA:  Dizziness and shortness of breath. EXAM: CHEST - 2 VIEW COMPARISON:  05/23/2019. FINDINGS: Similar appearance of volume loss in the left hemithorax with elevation of the left hemidiaphragm and left base collapse/consolidative opacity. There is diffuse interstitial and pleural opacity in the left chest. Right lung is hyperexpanded without focal consolidation or pleural effusion. Bones are diffusely demineralized. IMPRESSION: Largely stable exam. Marked volume loss left hemithorax with posterior collapse/consolidative change in diffuse pleural thickening. Electronically Signed   By: Misty Stanley M.D.   On: 06/17/2019 16:43   CT Angio Chest PE W/Cm &/Or Wo Cm  Result Date: 06/17/2019 CLINICAL DATA:  Shortness of breath EXAM: CT ANGIOGRAPHY CHEST WITH CONTRAST TECHNIQUE: Multidetector CT imaging of the chest was performed using the standard protocol during bolus administration of intravenous contrast. Multiplanar CT image reconstructions and MIPs were obtained to evaluate the vascular anatomy. CONTRAST:  9mL OMNIPAQUE IOHEXOL 350 MG/ML SOLN COMPARISON:  03/06/2019 FINDINGS: Cardiovascular: No filling defects in the pulmonary arteries to suggest pulmonary emboli. Heart is normal size. Aorta normal caliber. Aortic and coronary artery atherosclerosis. Mediastinum/Nodes: There is pneumomediastinum. Gas seen around the main pulmonary artery and in the posterior mediastinum. No adenopathy.  Lungs/Pleura: Volume loss on the left with elevation of the left hemidiaphragm. Findings are chronic. There is a chronic masslike area of consolidation in the left lower lobe which is similar to prior study. Advanced centrilobular emphysema and areas of scarring in both lungs. Possible small left effusion, stable. Upper Abdomen: Imaging into the upper abdomen shows no acute findings. Musculoskeletal: Cachexia.  No acute bony abnormality. Review of the MIP images confirms the above findings. IMPRESSION: Pneumomediastinum seen in the middle and posterior mediastinum. No associated pneumothorax. Advanced centrilobular emphysema. Stable chronic volume loss on the left with elevation of the left hemidiaphragm and masslike consolidation in the left lower lobe. As recommended on prior study, bronchoscopy with attention to the left lower lobe and/or PET CT may be helpful to exclude left lower lobe mass lesion. Small right pleural effusion. Areas of scarring in the lungs bilaterally. Aortic Atherosclerosis (ICD10-I70.0) and Emphysema (ICD10-J43.9). Electronically Signed   By: Rolm Baptise M.D.   On: 06/17/2019 21:12   CT Abdomen Pelvis W Contrast  Result Date: 06/17/2019 CLINICAL DATA:  Hypercalcemia. EXAM: CT ABDOMEN AND PELVIS WITH CONTRAST TECHNIQUE: Multidetector CT imaging of the abdomen and pelvis was performed using the standard protocol following bolus administration of intravenous contrast. CONTRAST:  12mL OMNIPAQUE IOHEXOL 350 MG/ML SOLN COMPARISON:  None. FINDINGS: Lower chest: Masslike opacity seen in the left lower lobe as described on prior chest CT. Trace left pleural effusion. Volume loss on the left with elevation of the left hemidiaphragm. Advanced emphysema. Scarring in the right lower lobe. Hepatobiliary: No focal hepatic abnormality. Gallbladder unremarkable. Pancreas: Calcifications throughout the pancreas compatible with chronic pancreatitis. No evidence of acute pancreatitis, fall suspicious  pancreatic lesion or pancreatic ductal dilatation. Spleen: No focal abnormality.  Normal size. Adrenals/Urinary Tract: Small cysts in the left kidney. No hydronephrosis. Adrenal glands and urinary bladder unremarkable. Stomach/Bowel: Large stool burden throughout the colon. Distention of the rectum with stool concerning for fecal impaction. Stomach and small bowel decompressed, unremarkable. Vascular/Lymphatic: Aortic atherosclerosis. No enlarged abdominal or pelvic lymph nodes. Reproductive: Prostate prominence. Other: No free fluid or free air. Musculoskeletal: Cachexia. No acute bony abnormality. Degenerative disc and facet disease in the lower lumbar spine. IMPRESSION: Masslike opacity in the left lower lobe as described on chest CT concerning for possible pulmonary mass  lesions/malignancy. This could be further evaluated with bronchoscopy and/or PET CT as described on prior chest CT. Advanced emphysema. Large stool burden throughout the colon. Appearance is concerning for fecal impaction. Chronic pancreatitis changes.  No evidence of acute pancreatitis. Aortic atherosclerosis. Electronically Signed   By: Rolm Baptise M.D.   On: 06/17/2019 21:16   DG Abd 2 Views  Result Date: 05/23/2019 CLINICAL DATA:  Constipation. EXAM: ABDOMEN - 2 VIEW COMPARISON:  Chest x-ray 05/08/2019. FINDINGS: There is a moderate amount of stool identified within the left upper quadrant of the abdomen and in the rectum. No dilated loops of bowel. Asymmetric elevation of the left hemidiaphragm is noted. There is diminished aeration to the left midlung and left base. IMPRESSION: 1. Moderate amount of stool identified at the level of the splenic flexure and in the rectum which may reflect constipation. 2. No evidence for bowel obstruction. 3. Diminished aeration to the left midlung and left base. Similar to chest radiograph from 05/08/2019 Electronically Signed   By: Kerby Moors M.D.   On: 05/23/2019 12:35    ASSESSMENT: Left lower  lobe lung mass, hypercalcemia of malignancy.  PLAN:    1.  Left lower lobe lung mass: Highly suspicious for underlying malignancy.  Upon discharge, patient will require biopsy as well as PET scan to complete the staging work-up.  Plan to discuss with pulmonary and interventional radiology to determine safest possible way to obtain tissue.  Okay to discharge from an oncology standpoint.  Will arrange follow-up with the patient in the cancer center on Monday or Tuesday of next week for continued diagnostic evaluation. 2.  Hypercalcemia: Likely secondary to malignancy.  Improved with IV fluids.  Patient also received 4 mg IV Zometa today. 3.  Anemia: Patient's most recent hemoglobin is 9.2.  Continue to monitor closely. 4.  Leukocytosis: Likely reactive, monitor. 5.  Thrombocytosis: Likely reactive, monitor.  Appreciate consult, will follow.  Lloyd Huger, MD   06/18/2019 12:07 PM

## 2019-06-18 NOTE — ED Notes (Signed)
Patient more awake, asking for pain medication. Will check with MD. Refuses to be repositioned, offered to turn patient on his side. No other complaints at this time

## 2019-06-18 NOTE — Progress Notes (Signed)
PROGRESS NOTE    Corey Carr  WFU:[id number] DOB: 1941-03-04 DOA: 06/17/2019 PCP: [name], MD    Brief Narrative:  Corey Carr is a 79 y.o. male with medical history significant for known lung mass x1 month with ongoing unintentional weight loss, suspected lung cancer, COPD and hypertension, with history of COVID-19 pneumonia in 05/08/19, hospitalized in January 2021 with hypercalcemia, dehydration with acute kidney injury, who was sent by his PCP for hypercalcemia of 11.7 after presenting with persistent weakness with poor oral intake and shortness of breath.  Wife at bedside and answers questions.  Patient too lethargic to contribute to history   1/27: Patient seen and examined.  Wife at bedside.  Patient much more awake this morning.  Alert and oriented x3.  No specific complaints.  Oncology physician in the room at time of my evaluation.  Plan to stabilize patient from a hospital standpoint and have him follow-up with cancer center for further work-up regarding the newly diagnosed lung mass   Assessment & Plan:   Principal Problem:   Hypercalcemia of malignancy Active Problems:   AKI (acute kidney injury) ([employer])   Lung mass   Essential hypertension   COPD with chronic bronchitis ([place])   History of 2019 novel coronavirus disease (COVID-19)   Cancer cachexia ([place])   Acute metabolic encephalopathy  Hypercalcemia of suspected malignancy Patient calcium is approximately 12 presentation and is downtrending Received aggressive intravenous fluids at 200 cc/h overnight Seen by oncology today Holding IV fluids for now We will adjust zoledronic acid IV Plan to recheck calcium levels as outpatient   AKI (acute kidney injury) ([place]) In 1.47 above baseline of 0.88 Likely secondary to poor oral intake related to suspected malignancy Improving over interval Monitor renal function Avoid nephrotoxins  Acute metabolic encephalopathy, improving Patient lethargic on  admission Likely due to hypercalcemia Much more awake today We will reinstate regular    Lung mass, suspect malignancy Outpatient biopsy arranged Oncology at bedside at time my evaluation today Patient will follow up with cancer center for further evaluation management    Cancer cachexia [facility]) Protein calorie malnutrition, suspect severe Nutritionist evaluation Physical therapy and Occupational Therapy evaluations Patient likely will benefit from skilled nursing facility placement versus home health   Essential hypertension Blood pressure stable off meds    COPD with chronic bronchitis ([place]) Not acutely exacerbated Bronchodilator therapy as needed    History of 2019 novel coronavirus disease (COVID-19) Patient was hospitalized in December for Covid pneumonia and completed a remdesivir course Repeat Covid test pending. No clinical indication of acute Covid infection   DVT prophylaxis: lovenox Code Status: dnr  Family Communication: wife, annie Knauff  Disposition Plan:  Anticipate home with home health    Consultants:   none  Procedures:   none  Antimicrobials:   none   Subjective: Patient seen and examined Much more awake this morning Wife at bedside No complaints  Objective: Vitals:   06/17/19 2330 06/18/19 0000 06/18/19 0100 06/18/19 0220  BP: 113/66 127/73 131/73 139/80  Pulse: 85 87 90 91  Resp: 20 20 (!) 22 18  Temp:    97.9 F (36.6 C)  TempSrc:    Oral  SpO2: 100% 100% 100% 99%  Weight:    42.3 kg  Height:    5\' 8"  (1.727 m)    Intake/Output Summary (Last 24 hours) at 06/18/2019 1134 Last data filed at 06/18/2019 0954 Gross per 24 hour  Intake 1276.72 ml  Output 200 ml  Net 1076.[place]  ml   Filed Weights   06/17/19 1509 06/18/19 0220  Weight: 53 kg 42.3 kg    Examination:  General exam: appears fatigued, appears older than stated age Respiratory system: Clear to auscultation. Respiratory effort normal. Cardiovascular  system: S1 & S2 heard, RRR. No JVD, murmurs, rubs, gallops or clicks. No pedal edema. Gastrointestinal system: Abdomen is nondistended, soft and nontender. No organomegaly or masses felt. Normal bowel sounds heard. Central nervous system: Alert and oriented. No focal neurological deficits. Extremities: Decreased muscle tone diffusely Skin: No rashes, lesions or ulcers Psychiatry: Judgement and insight appear normal. Mood & affect appropriate.     Data Reviewed: I have personally reviewed following labs and imaging studies  CBC: Recent Labs  Lab 06/17/19 1511 06/17/19 2345 06/18/19 0346  WBC 19.6* 17.5* 16.5*  HGB 11.2* 9.3* 9.2*  HCT 33.3* 27.8* 27.7*  MCV 80.2 80.3 79.6*  PLT 710* 565* 557*   Basic Metabolic Panel: Recent Labs  Lab 06/17/19 1511 06/17/19 2017 06/17/19 2345 06/18/19 0346  NA 133*  --   --  135  K 4.3  --   --  4.5  CL 94*  --   --  102  CO2 23  --   --  27  GLUCOSE 126*  --   --  81  BUN 32*  --   --  32*  CREATININE 1.47*  --  1.27* 1.18  CALCIUM 12.5*  --   --  11.2*  MG  --  2.3  --   --   PHOS  --  3.6  --   --    GFR: Estimated Creatinine Clearance: 30.9 mL/min (by C-G formula based on SCr of 1.18 mg/dL). Liver Function Tests: Recent Labs  Lab 06/17/19 1511  AST 20  ALT 16  ALKPHOS 78  BILITOT 1.0  PROT 6.9  ALBUMIN 3.1*   No results for input(s): LIPASE, AMYLASE in the last 168 hours. No results for input(s): AMMONIA in the last 168 hours. Coagulation Profile: No results for input(s): INR, PROTIME in the last 168 hours. Cardiac Enzymes: No results for input(s): CKTOTAL, CKMB, CKMBINDEX, TROPONINI in the last 168 hours. BNP (last 3 results) No results for input(s): PROBNP in the last 8760 hours. HbA1C: No results for input(s): HGBA1C in the last 72 hours. CBG: No results for input(s): GLUCAP in the last 168 hours. Lipid Profile: No results for input(s): CHOL, HDL, LDLCALC, TRIG, CHOLHDL, LDLDIRECT in the last 72 hours. Thyroid  Function Tests: No results for input(s): TSH, T4TOTAL, FREET4, T3FREE, THYROIDAB in the last 72 hours. Anemia Panel: No results for input(s): VITAMINB12, FOLATE, FERRITIN, TIBC, IRON, RETICCTPCT in the last 72 hours. Sepsis Labs: No results for input(s): PROCALCITON, LATICACIDVEN in the last 168 hours.  Recent Results (from the past 240 hour(s))  SARS CORONAVIRUS 2 (TAT 6-24 HRS) Nasopharyngeal Nasopharyngeal Swab     Status: None   Collection Time: 06/17/19  9:57 PM   Specimen: Nasopharyngeal Swab  Result Value Ref Range Status   SARS Coronavirus 2 NEGATIVE NEGATIVE Final    Comment: (NOTE) SARS-CoV-2 target nucleic acids are NOT DETECTED. The SARS-CoV-2 RNA is generally detectable in upper and lower respiratory specimens during the acute phase of infection. Negative results do not preclude SARS-CoV-2 infection, do not rule out co-infections with other pathogens, and should not be used as the sole basis for treatment or other patient management decisions. Negative results must be combined with clinical observations, patient history, and epidemiological information. The expected result  is Negative. Fact Sheet for Patients: SugarRoll.be Fact Sheet for Healthcare Providers: https://www.woods-mathews.com/ This test is not yet approved or cleared by the [place] FDA and  has been authorized for detection and/or diagnosis of SARS-CoV-2 by FDA under an Emergency Use Authorization (EUA). This EUA will remain  in effect (meaning this test can be used) for the duration of the COVID-19 declaration under Section 56 4(b)(1) of the Act, 21 U.S.C. section 360bbb-3(b)(1), unless the authorization is terminated or revoked sooner. Performed at [facility], [place] [address] [place], [place] [address]          Radiology Studies: DG Chest 2 View  Result Date: 06/17/2019 CLINICAL DATA:  Dizziness and shortness of breath. EXAM: CHEST - 2 VIEW  COMPARISON:  05/23/2019. FINDINGS: Similar appearance of volume loss in the left hemithorax with elevation of the left hemidiaphragm and left base collapse/consolidative opacity. There is diffuse interstitial and pleural opacity in the left chest. Right lung is hyperexpanded without focal consolidation or pleural effusion. Bones are diffusely demineralized. IMPRESSION: Largely stable exam. Marked volume loss left hemithorax with posterior collapse/consolidative change in diffuse pleural thickening. Electronically Signed   By: Misty Stanley M.D.   On: 06/17/2019 16:43   CT Angio Chest PE W/Cm &/Or Wo Cm  Result Date: 06/17/2019 CLINICAL DATA:  Shortness of breath EXAM: CT ANGIOGRAPHY CHEST WITH CONTRAST TECHNIQUE: Multidetector CT imaging of the chest was performed using the standard protocol during bolus administration of intravenous contrast. Multiplanar CT image reconstructions and MIPs were obtained to evaluate the vascular anatomy. CONTRAST:  72mL OMNIPAQUE IOHEXOL 350 MG/ML SOLN COMPARISON:  03/06/2019 FINDINGS: Cardiovascular: No filling defects in the pulmonary arteries to suggest pulmonary emboli. Heart is normal size. Aorta normal caliber. Aortic and coronary artery atherosclerosis. Mediastinum/Nodes: There is pneumomediastinum. Gas seen around the main pulmonary artery and in the posterior mediastinum. No adenopathy. Lungs/Pleura: Volume loss on the left with elevation of the left hemidiaphragm. Findings are chronic. There is a chronic masslike area of consolidation in the left lower lobe which is similar to prior study. Advanced centrilobular emphysema and areas of scarring in both lungs. Possible small left effusion, stable. Upper Abdomen: Imaging into the upper abdomen shows no acute findings. Musculoskeletal: Cachexia.  No acute bony abnormality. Review of the MIP images confirms the above findings. IMPRESSION: Pneumomediastinum seen in the middle and posterior mediastinum. No associated  pneumothorax. Advanced centrilobular emphysema. Stable chronic volume loss on the left with elevation of the left hemidiaphragm and masslike consolidation in the left lower lobe. As recommended on prior study, bronchoscopy with attention to the left lower lobe and/or PET CT may be helpful to exclude left lower lobe mass lesion. Small right pleural effusion. Areas of scarring in the lungs bilaterally. Aortic Atherosclerosis (ICD10-I70.0) and Emphysema (ICD10-J43.9). Electronically Signed   By: Rolm Baptise M.D.   On: 06/17/2019 21:12   CT Abdomen Pelvis W Contrast  Result Date: 06/17/2019 CLINICAL DATA:  Hypercalcemia. EXAM: CT ABDOMEN AND PELVIS WITH CONTRAST TECHNIQUE: Multidetector CT imaging of the abdomen and pelvis was performed using the standard protocol following bolus administration of intravenous contrast. CONTRAST:  20mL OMNIPAQUE IOHEXOL 350 MG/ML SOLN COMPARISON:  None. FINDINGS: Lower chest: Masslike opacity seen in the left lower lobe as described on prior chest CT. Trace left pleural effusion. Volume loss on the left with elevation of the left hemidiaphragm. Advanced emphysema. Scarring in the right lower lobe. Hepatobiliary: No focal hepatic abnormality. Gallbladder unremarkable. Pancreas: Calcifications throughout the pancreas compatible with  chronic pancreatitis. No evidence of acute pancreatitis, fall suspicious pancreatic lesion or pancreatic ductal dilatation. Spleen: No focal abnormality.  Normal size. Adrenals/Urinary Tract: Small cysts in the left kidney. No hydronephrosis. Adrenal glands and urinary bladder unremarkable. Stomach/Bowel: Large stool burden throughout the colon. Distention of the rectum with stool concerning for fecal impaction. Stomach and small bowel decompressed, unremarkable. Vascular/Lymphatic: Aortic atherosclerosis. No enlarged abdominal or pelvic lymph nodes. Reproductive: Prostate prominence. Other: No free fluid or free air. Musculoskeletal: Cachexia. No acute  bony abnormality. Degenerative disc and facet disease in the lower lumbar spine. IMPRESSION: Masslike opacity in the left lower lobe as described on chest CT concerning for possible pulmonary mass lesions/malignancy. This could be further evaluated with bronchoscopy and/or PET CT as described on prior chest CT. Advanced emphysema. Large stool burden throughout the colon. Appearance is concerning for fecal impaction. Chronic pancreatitis changes.  No evidence of acute pancreatitis. Aortic atherosclerosis. Electronically Signed   By: Rolm Baptise M.D.   On: 06/17/2019 21:16        Scheduled Meds: . docusate sodium  100 mg Oral BID  . enoxaparin (LOVENOX) injection  40 mg Subcutaneous Q24H  . polyethylene glycol  17 g Oral Daily  . senna-docusate  1 tablet Oral BID   Continuous Infusions:   LOS: 1 day    Time spent: 35 minutes    Sidney Ace, MD [facility] Pager [phone]  If 7PM-7AM, please contact night-coverage [email or web address] 06/18/2019, 11:34 AM

## 2019-06-18 NOTE — TOC Initial Note (Addendum)
Transition of Care [facility]) - Initial/Assessment Note    Patient Details  Name: Corey Carr MRN: [id number] Date of Birth: 1941/02/13  Transition of Care [facility]) CM/SW Contact:    Su Hilt, RN Phone Number: 06/18/2019, 3:45 PM  Clinical Narrative:                  Spoke to PT and they recommend [facility] for the patient, the patient had AMedysis scheduled in December and per the wife they never came.  I requested services from [facility] and Corene Cornea stated that they do not have the staff for it, [facility] is no longer INN, [facility] is not able to accept the patient. Helene Kelp with [facility] is unable to accept the patient, I called Colletta Maryland with [facility] and am awaiting an response if they can accept the patient       Patient Goals and CMS Choice        Expected Discharge Plan and Services                                                Prior Living Arrangements/Services                       Activities of Daily Living Home Assistive Devices/Equipment: Dentures (specify type), Walker (specify type) ADL Screening (condition at time of admission) Patient's cognitive ability adequate to safely complete daily activities?: Yes Is the patient deaf or have difficulty hearing?: No Does the patient have difficulty seeing, even when wearing glasses/contacts?: No Does the patient have difficulty concentrating, remembering, or making decisions?: No Patient able to express need for assistance with ADLs?: Yes Does the patient have difficulty dressing or bathing?: Yes Independently performs ADLs?: No Does the patient have difficulty walking or climbing stairs?: Yes Weakness of Legs: Both Weakness of Arms/Hands: Both  Permission Sought/Granted                  Emotional Assessment              Admission diagnosis:  Hypercalcemia [E83.52] Pneumomediastinum ([place]) [J98.2] Lung mass [R91.8] Hypercalcemia of malignancy [E83.52] Patient Active Problem List   Diagnosis Date Noted  . Acute metabolic encephalopathy 50/51/8335  . Protein-calorie malnutrition, severe 06/18/2019  . Hypercalcemia of malignancy 06/17/2019  . COPD with chronic bronchitis ([place]) 06/17/2019  . History of 2019 novel coronavirus disease (COVID-19) 06/17/2019  . Cancer cachexia ([place]) 06/17/2019  . Acute kidney injury ([place]) 05/24/2019  . Hyperkalemia 05/23/2019  . COVID-19 virus detected 05/10/2019  . AKI (acute kidney injury) ([place]) 05/09/2019  . Lung mass 05/09/2019  . Essential hypertension 05/09/2019  . Asthma 05/09/2019  . Vitamin B12 deficiency 05/09/2019  . Dehydration 05/09/2019  . Seasonal allergies 05/09/2019  . Failure to thrive (0-17) 05/08/2019   PCP:  [name], MD Pharmacy:   [id number] - [place], [place] [address] Phone: [phone] Fax: [phone]     Social Determinants of Health (SDOH) Interventions    Readmission Risk Interventions No flowsheet data found.

## 2019-06-18 NOTE — Progress Notes (Signed)
PHARMACIST - PHYSICIAN COMMUNICATION  CONCERNING:  Enoxaparin (Lovenox) for DVT Prophylaxis    RECOMMENDATION: Patient was prescribed enoxaprin 40mg  q24 hours for VTE prophylaxis.   Filed Weights   06/17/19 1509 06/18/19 0220  Weight: 116 lb 13.5 oz (53 kg) 93 lb 4.1 oz (42.3 kg)    Body mass index is 14.18 kg/m.  Estimated Creatinine Clearance: 30.9 mL/min (by C-G formula based on SCr of 1.18 mg/dL).  Patient is candidate for enoxaparin 30mg  every 24 hours  Weight<45kg    DESCRIPTION: Pharmacy has adjusted enoxaparin dose per [facility] policy.  Patient is now receiving enoxaparin 30mg  every 24 hours.  Pernell Dupre, PharmD, BCPS Clinical Pharmacist 06/18/2019 12:08 PM

## 2019-06-18 NOTE — Progress Notes (Signed)
Initial Nutrition Assessment  DOCUMENTATION CODES:   Severe malnutrition in context of chronic illness, Underweight  INTERVENTION:  Provide Ensure Enlive po TID, each supplement provides 350 kcal and 20 grams of protein. Patient prefers chocolate.  Provide Magic cup TID with meals, each supplement provides 290 kcal and 9 grams of protein. Patient prefers chocolate.  Encouraged adequate intake of calories and protein from meals. Discussed choosing calorie- and protein-dense foods/beverages.  NUTRITION DIAGNOSIS:   Severe Malnutrition related to chronic illness(COPD, lung mass suspicious for malignancy, catabolic nature of recent COVID-19 PNA) as evidenced by severe fat depletion, severe muscle depletion, 25.4% weight loss over 3 months.  GOAL:   Patient will meet greater than or equal to 90% of their needs  MONITOR:   PO intake, Supplement acceptance, Labs, Weight trends, Skin, I & O's  REASON FOR ASSESSMENT:   Consult Assessment of nutrition requirement/status  ASSESSMENT:   79 year old male with PMHx of HTN, COPD, hx COVID-19 PNA 05/08/2019 admitted with hypercalcemia of suspected malignancy, AKI, lung mass with suspected malignancy.   Met with patient and his wife at bedside. Patient reports he has had a poor appetite for the past 2 months. He attempts to eat several times per day but is only having bites/sips at meals. He reports he has been drinking chocolate Ensure at home and enjoys them. He is willing to keep drinking them here and also is willing to try chocolate Magic Cup. Discussed increased calorie/protein needs and importance of choosing calorie- and protein-dense foods/beverages.  Patient's UBW was 145 lbs (65.9 kg). According to chart he was 69.5 kg on 11/14/2016 and then there is limited weight history after that until recently. He was 56.7 kg on 03/06/2019, 53.1 kg on 04/16/2019, and is now 42.3 kg (93.25 lbs). He has lost 14.4 kg (25.4% body weight) over the past 3  months, which is significant for time frame.  Medications reviewed and include: Megace 400 mg daily, pantoprazole, senna-docusate 1 tablet BID.  Labs reviewed: BUN 32, Calcium 11.2.  NUTRITION - FOCUSED PHYSICAL EXAM:    Most Recent Value  Orbital Region  Severe depletion  Upper Arm Region  Severe depletion  Thoracic and Lumbar Region  Severe depletion  Buccal Region  Severe depletion  Temple Region  Severe depletion  Clavicle Bone Region  Severe depletion  Clavicle and Acromion Bone Region  Severe depletion  Scapular Bone Region  Severe depletion  Dorsal Hand  Severe depletion  Patellar Region  Severe depletion  Anterior Thigh Region  Severe depletion  Posterior Calf Region  Severe depletion  Edema (RD Assessment)  None  Hair  Reviewed  Eyes  Reviewed  Mouth  Reviewed  Skin  Reviewed  Nails  Reviewed     Diet Order:   Diet Order            Diet regular Room service appropriate? Yes; Fluid consistency: Thin  Diet effective now             EDUCATION NEEDS:   Education needs have been addressed  Skin:  Skin Assessment: Skin Integrity Issues:(MSAD to sacrum)  Last BM:  06/18/2019 medium type 6  Height:   Ht Readings from Last 1 Encounters:  06/18/19 '5\' 8"'  (1.727 m)   Weight:   Wt Readings from Last 1 Encounters:  06/18/19 42.3 kg   Ideal Body Weight:  70 kg  BMI:  Body mass index is 14.18 kg/m.  Estimated Nutritional Needs:   Kcal:  1400-1600  Protein:  70-80 grams  Fluid:  1.4-1.6 L/day  Jacklynn Barnacle, MS, RD, LDN Office: [phone] Pager: [phone] After Hours/Weekend Pager: [phone]

## 2019-06-19 LAB — BASIC METABOLIC PANEL
Anion gap: 8 (ref 5–15)
BUN: 25 mg/dL — ABNORMAL HIGH (ref 8–23)
CO2: 23 mmol/L (ref 22–32)
Calcium: 10.4 mg/dL — ABNORMAL HIGH (ref 8.9–10.3)
Chloride: 104 mmol/L (ref 98–111)
Creatinine, Ser: 0.9 mg/dL (ref 0.61–1.24)
GFR calc Af Amer: >60 mL/min (ref >60)
GFR calc non Af Amer: >60 mL/min (ref >60)
Glucose, Bld: 106 mg/dL — ABNORMAL HIGH (ref 70–99)
Potassium: 3.8 mmol/L (ref 3.5–5.1)
Sodium: 135 mmol/L (ref 135–145)

## 2019-06-19 LAB — CALCIUM, IONIZED: Calcium, Ionized, Serum: 7.1 mg/dL — ABNORMAL HIGH (ref 4.5–5.6)

## 2019-06-19 MED ORDER — SENNOSIDES-DOCUSATE SODIUM 8.6-50 MG PO TABS: 1.0000 | ORAL_TABLET | Freq: Every evening | ORAL | 0 refills | Status: AC | PRN

## 2019-06-19 NOTE — Progress Notes (Signed)
Occupational Therapy Treatment Patient Details Name: Corey Carr MRN: [id number] DOB: 11/25/1940 Today's Date: 06/19/2019    History of present illness Corey Carr is a 79 y.o. male with medical history significant for known lung mass x1 month with ongoing unintentional weight loss, suspected lung cancer, COPD and hypertension, with history of COVID-19 pneumonia in 05/08/19, hospitalized in January 2021 with hypercalcemia, dehydration with acute kidney injury, who was sent by his PCP for hypercalcemia of 11.7 after presenting with persistent weakness with poor oral intake and shortness of breath.  Upon further evaluation he was noted to have a left lower lobe lung mass highly suspicious for underlying malignancy.   OT comments  [name] was seen for OT treatment on this date. Upon arrival to room pt awake/alert, semi-supine in bed, with wife present at bedside. Pt reporting 9/10 rectal pain stating "the usual pain" when asked, but agreeable to OT tx session and eager to "move a little bit" this morning. Pt performs bed mobility with modified independence. Is able to stand usig RW and given mod cueing for safety/hand placement this date. OT engages pt in ADLs as described below (see ADL section for detail) with pt putting forth good effort and demonstrating good overall safety awareness and stability with standing and seated ADL tasks. Pt vitals monitored t/o session and remain WFLs, with pt denying any adverse symptoms with activity this date. Upon return to bed, pt noted to have several small pink open clustered over sacrum. Pt and caregiver endorse hx of sacral pressure sore, however pt wife states the wound appears worse this date than on previous dates. Pt primary RN notified immediately and in room to assess at end of session. Pt and caregiver educated on importance of regular positional changes both for pt strength and activity tolerance as well as for promoting skin integrity. OT assists pt  with placing pillows for pressure relief on low back.Pt left with RN and caregiver at bedside. Pt making good progress toward goals and continues to benefit from skilled OT services to maximize return to PLOF and minimize risk of future falls, injury, caregiver burden, and readmission. Will continue to follow POC as written. Discharge recommendation remains appropriate.    Follow Up Recommendations  Home health OT    Equipment Recommendations  3 in 1 bedside commode    Recommendations for Other Services      Precautions / Restrictions Precautions Precautions: Fall Precaution Comments: High Fall Restrictions Weight Bearing Restrictions: No       Mobility Bed Mobility Overal bed mobility: Modified Independent             General bed mobility comments: Pt comes to sitting EOB with increased time/effort but otherwise no assist from therapist. Denies SOB with exertion this date.  Transfers Overall transfer level: Needs assistance Equipment used: Rolling walker (2 wheeled) Transfers: Sit to/from Stand Sit to Stand: Min guard;Supervision         General transfer comment: Pt performs STS from multiple surfaces including room commode w/o assist from this author. Ambulates in room with supervision for safety.    Balance Overall balance assessment: Needs assistance Sitting-balance support: Feet supported Sitting balance-Leahy Scale: Good Sitting balance - Comments: Steady static and dynamic sitting EOB and on commode. Able to weight shift w/o LOB.   Standing balance support: During functional activity;No upper extremity supported Standing balance-Leahy Scale: Good Standing balance comment: Steady static standing w/o UE support during functional tasks.  ADL either performed or assessed with clinical judgement   ADL Overall ADL's : Needs assistance/impaired Eating/Feeding: Set up;Independent   Grooming: Standing;Cueing for safety;With  adaptive equipment;Wash/dry face;Oral care;Min guard;Set up;Supervision/safety Grooming Details (indicate cue type and reason): Pt performs standing grooming tasks at rooms sink this date. Requires min cueing for safety and occasional CGA for balance with standing tasks, but otherwise is able to complete oral care and face washing without physical assist from therapist.                 Toilet Transfer: RW;Set up;Supervision/safety;Regular Toilet;Ambulation Toilet Transfer Details (indicate cue type and reason): Pt ambulates to room toilet this date using RW and with supervision for safety as well as set-up of environment for safe ingress/egress. Toileting- Clothing Manipulation and Hygiene: Set up;Supervision/safety;Sitting/lateral lean Toileting - Clothing Manipulation Details (indicate cue type and reason): Pt incontenent of bowel this date. Requires set-up/sup for peri-care while seated on room commode. Pt performs all hygine management w/o physical assist from therapist doing sitting lateral leans. Min cueing for safety throughout.             Vision Baseline Vision/History: Wears glasses Wears Glasses: Reading only Patient Visual Report: No change from baseline     Perception     Praxis      Cognition Arousal/Alertness: Awake/alert Behavior During Therapy: WFL for tasks assessed/performed Overall Cognitive Status: Within Functional Limits for tasks assessed                                          Exercises Other Exercises Other Exercises: Pt and caregiver educated on positional strategies to support skin integrity and safety. Pt/caregiver also educated on importance of regular activity and mobility to promote skin integrity as well as functional strength and activity tolerance. Other Exercises: OT engages pt in standing grooming tasks and toileting/toilet transfer this date. See ADL section for details.   Shoulder Instructions       General Comments  Pt noted to have several small pink, patchy, spots of open wound over sacrum this AM. Pt wife at bedside states appearance of sacral wound is worsening. RN notified and in room to assess at end of session. Pt and caregiver educated on positional strategies to support skin integrity and safety while in bed. Pt also educated on importance of regular activity and mobility to promote skin integrity as well as functional strength and activity tolerance.    Pertinent Vitals/ Pain       Pain Score: 9  Pain Location: Rectal pain from chronic diarrhea. Pain Descriptors / Indicators: Constant Pain Intervention(s): Limited activity within patient's tolerance;Monitored during session;Premedicated before session  Home Living                                          Prior Functioning/Environment              Frequency  Min 2X/week        Progress Toward Goals  OT Goals(current goals can now be found in the care plan section)  Progress towards OT goals: Progressing toward goals  Acute Rehab OT Goals Patient Stated Goal: To go home OT Goal Formulation: With patient Time For Goal Achievement: 07/02/19 Potential to Achieve Goals: Good  Plan Discharge plan remains appropriate;Frequency remains appropriate  Co-evaluation                 AM-PAC OT "6 Clicks" Daily Activity     Outcome Measure   Help from another person eating meals?: None Help from another person taking care of personal grooming?: A Little Help from another person toileting, which includes using toliet, bedpan, or urinal?: A Little Help from another person bathing (including washing, rinsing, drying)?: A Little Help from another person to put on and taking off regular upper body clothing?: None Help from another person to put on and taking off regular lower body clothing?: A Little 6 Click Score: 20    End of Session Equipment Utilized During Treatment: Gait belt;Rolling walker  OT Visit  Diagnosis: Other abnormalities of gait and mobility (R26.89);Muscle weakness (generalized) (M62.81)   Activity Tolerance Patient tolerated treatment well   Patient Left in bed;with call bell/phone within reach;with bed alarm set;with family/visitor present;with nursing/sitter in room   Nurse Communication Other (comment)(Pt noted to have worsening sacral wound.)        Time: 6015-6153 OT Time Calculation (min): 31 min  Charges: OT General Charges $OT Visit: 1 Visit OT Treatments $Self Care/Home Management : 23-37 mins  Shara Blazing, M.S., OTR/L Ascom: [phone] 06/19/19, 10:10 AM

## 2019-06-19 NOTE — Discharge Summary (Signed)
Physician Discharge Summary  Corey Carr DJT:[id number] DOB: 11-16-1940 DOA: 06/17/2019  PCP: [name], MD  Admit date: 06/17/2019 Discharge date: 06/19/2019  Admitted From: Home Disposition: Home with home health  Recommendations for Outpatient Follow-up:  1. Follow up with PCP in 1-2 weeks 2. Follow-up with oncology Dr. Grayland Ormond as directed  Home Health: Yes Equipment/Devices: None Discharge Condition: Stable CODE STATUS: DNR Diet recommendation: Regular  Brief/Interim Summary: Corey Rogersis a 79 y.o.malewith medical history significant forknown lung mass x1 month with ongoing unintentional weight loss, suspected lung cancer, COPD and hypertension, with history of COVID-19 pneumonia in12/17/20, hospitalized in January 2021 with hypercalcemia, dehydration with acute kidney injury, who was sent by his PCP for hypercalcemia of 11.7 after presenting with persistent weakness with poor oral intake and shortness of breath.Wife at bedside and answers questions. Patient too lethargic to contribute to history   1/27: Patient seen and examined.  Wife at bedside.  Patient much more awake this morning.  Alert and oriented x3.  No specific complaints.  Oncology physician in the room at time of my evaluation.  Plan to stabilize patient from a hospital standpoint and have him follow-up with cancer center for further work-up regarding the newly diagnosed lung mass  1/28: Patient seen and examined.  Wife at bedside.  Social work looking for home health options.  Patient improved symptomatically.  Much more awake this morning.  Multiple BMs over interval.  Calcium downtrending after ZA administration and fluids yesterday  Discharge Diagnoses:  Principal Problem:   Hypercalcemia of malignancy Active Problems:   AKI (acute kidney injury) ([place])   Lung mass   Essential hypertension   COPD with chronic bronchitis (HCC)   History of 2019 novel coronavirus disease (COVID-19)   Cancer  cachexia (HCC)   Acute metabolic encephalopathy   Protein-calorie malnutrition, severe  Hypercalcemia ofsuspectedmalignancy Patient calcium is approximately 12 presentation and is downtrending Received aggressive intravenous fluids at 200 cc/h  S/p Zometa infusion Calcium downtrending Symptoms improved Medically stable for discharge   AKI (acute kidney injury) ([place]), resolved In 1.47 above baseline of 0.88 Likely secondary to poor oral intake related to suspected malignancy Improving over interval Monitor renal function Avoid nephrotoxins  Acute metabolic encephalopathy, improving Patient lethargic on admission Likely due to hypercalcemia Much more awake today Continue regular diet  Lung mass, suspect malignancy Outpatient biopsy arranged Oncology at bedside 1/27, care discussed with Dr. Grayland Ormond Patient will follow up with cancer center for further evaluation and management  Cancer cachexia [facility]) Proteincalorie malnutrition, suspect severe Nutritionist evaluation Physical therapy and Occupational Therapy evaluations Patient likely will benefit from skilled nursing facility placement versus home health Physical therapy recommending home health, social work aware  Essential hypertension Blood pressure stable off meds  COPD with chronic bronchitis ([place]) Not acutely exacerbated Bronchodilator therapy as needed  History of 2019 novel coronavirus disease (COVID-19) Patient was hospitalized in December for Covid pneumonia and completed a remdesivir course Repeat Covid test pending. No clinical indication of acute Covid infection  Discharge Instructions  Discharge Instructions    Diet - low sodium heart healthy   Complete by: As directed    Increase activity slowly   Complete by: As directed      Allergies as of 06/19/2019   No Known Allergies     Medication List    TAKE these medications   albuterol 108 (90 Base) MCG/ACT inhaler Commonly  known as: VENTOLIN HFA Inhale 2 puffs into the lungs See admin instructions. Inhale 2 puffs into  the lungs every 3 hours as needed for wheezing.   aspirin 325 MG tablet Take 1 tablet by mouth daily.   esomeprazole 20 MG capsule Commonly known as: NEXIUM Take 20 mg by mouth daily at 12 noon.   feeding supplement (ENSURE ENLIVE) Liqd Take 237 mLs by mouth 3 (three) times daily between meals.   fluticasone 50 MCG/ACT nasal spray Commonly known as: FLONASE Place 2 sprays into both nostrils daily as needed for allergies or rhinitis.   HYDROcodone-acetaminophen 5-325 MG tablet Commonly known as: NORCO/VICODIN Take 1 tablet by mouth every 6 (six) hours as needed.   loratadine 10 MG tablet Commonly known as: Allergy Relief Take 1 tablet (10 mg total) by mouth daily.   megestrol 40 MG tablet Commonly known as: MEGACE Take 40 mg by mouth daily.   meloxicam 15 MG tablet Commonly known as: MOBIC Take 15 mg by mouth daily as needed for pain.   omega-3 acid ethyl esters 1 g capsule Commonly known as: LOVAZA Take 1 g by mouth daily.   polyethylene glycol powder 17 GM/SCOOP powder Commonly known as: GLYCOLAX/MIRALAX Take 17 g by mouth 2 (two) times daily. Mix in 4-8 ounces of fluid prior to taking.   senna-docusate 8.6-50 MG tablet Commonly known as: Senokot-S Take 1 tablet by mouth at bedtime as needed for mild constipation.   Trelegy Ellipta 100-62.5-25 MCG/INH Aepb Generic drug: Fluticasone-Umeclidin-Vilant Inhale 1 puff into the lungs daily.   vitamin B-12 500 MCG tablet Commonly known as: CYANOCOBALAMIN Take 500 mcg by mouth daily.   vitamin C 250 MG tablet Commonly known as: ASCORBIC ACID Take 500 mg by mouth daily.   vitamin E 180 MG (400 UNITS) capsule Generic drug: vitamin E Take 400 Units by mouth daily.      Follow-up Information    Lloyd Huger, MD. Go to.   Specialty: Oncology Why: Tuesday Feburary 2nd 2021 at 11:00 am Contact information: [place] [address] [phone]          No Known Allergies  Consultations:  [facility]cology-[facility] [place]   Procedures/Studies: DG Chest 1 View  Result Date: 05/23/2019 CLINICAL DATA:  History of recent COVID-19 EXAM: CHEST  1 VIEW COMPARISON:  05/08/2019 FINDINGS: Cardiac shadows within normal limits. Right lung demonstrates some mild interstitial changes without focal infiltrate. Chronic volume loss in the left base is noted similar to that seen on prior exam. IMPRESSION: Chronic changes in the left base.  No acute infiltrate is seen. Electronically Signed   By: Inez Catalina M.D.   On: 05/23/2019 13:32   DG Chest 2 View  Result Date: 06/17/2019 CLINICAL DATA:  Dizziness and shortness of breath. EXAM: CHEST - 2 VIEW COMPARISON:  05/23/2019. FINDINGS: Similar appearance of volume loss in the left hemithorax with elevation of the left hemidiaphragm and left base collapse/consolidative opacity. There is diffuse interstitial and pleural opacity in the left chest. Right lung is hyperexpanded without focal consolidation or pleural effusion. Bones are diffusely demineralized. IMPRESSION: Largely stable exam. Marked volume loss left hemithorax with posterior collapse/consolidative change in diffuse pleural thickening. Electronically Signed   By: Misty Stanley M.D.   On: 06/17/2019 16:43   CT Angio Chest PE W/Cm &/Or Wo Cm  Result Date: 06/17/2019 CLINICAL DATA:  Shortness of breath EXAM: CT ANGIOGRAPHY CHEST WITH CONTRAST TECHNIQUE: Multidetector CT imaging of the chest was performed using the standard protocol during bolus administration of intravenous contrast. Multiplanar CT image reconstructions and MIPs were obtained  to evaluate the vascular anatomy. CONTRAST:  48mL OMNIPAQUE IOHEXOL 350 MG/ML SOLN COMPARISON:  03/06/2019 FINDINGS: Cardiovascular: No filling defects in the pulmonary arteries to suggest pulmonary emboli. Heart is normal size. Aorta normal caliber.  Aortic and coronary artery atherosclerosis. Mediastinum/Nodes: There is pneumomediastinum. Gas seen around the main pulmonary artery and in the posterior mediastinum. No adenopathy. Lungs/Pleura: Volume loss on the left with elevation of the left hemidiaphragm. Findings are chronic. There is a chronic masslike area of consolidation in the left lower lobe which is similar to prior study. Advanced centrilobular emphysema and areas of scarring in both lungs. Possible small left effusion, stable. Upper Abdomen: Imaging into the upper abdomen shows no acute findings. Musculoskeletal: Cachexia.  No acute bony abnormality. Review of the MIP images confirms the above findings. IMPRESSION: Pneumomediastinum seen in the middle and posterior mediastinum. No associated pneumothorax. Advanced centrilobular emphysema. Stable chronic volume loss on the left with elevation of the left hemidiaphragm and masslike consolidation in the left lower lobe. As recommended on prior study, bronchoscopy with attention to the left lower lobe and/or PET CT may be helpful to exclude left lower lobe mass lesion. Small right pleural effusion. Areas of scarring in the lungs bilaterally. Aortic Atherosclerosis (ICD10-I70.0) and Emphysema (ICD10-J43.9). Electronically Signed   By: Rolm Baptise M.D.   On: 06/17/2019 21:12   CT Abdomen Pelvis W Contrast  Result Date: 06/17/2019 CLINICAL DATA:  Hypercalcemia. EXAM: CT ABDOMEN AND PELVIS WITH CONTRAST TECHNIQUE: Multidetector CT imaging of the abdomen and pelvis was performed using the standard protocol following bolus administration of intravenous contrast. CONTRAST:  64mL OMNIPAQUE IOHEXOL 350 MG/ML SOLN COMPARISON:  None. FINDINGS: Lower chest: Masslike opacity seen in the left lower lobe as described on prior chest CT. Trace left pleural effusion. Volume loss on the left with elevation of the left hemidiaphragm. Advanced emphysema. Scarring in the right lower lobe. Hepatobiliary: No focal hepatic  abnormality. Gallbladder unremarkable. Pancreas: Calcifications throughout the pancreas compatible with chronic pancreatitis. No evidence of acute pancreatitis, fall suspicious pancreatic lesion or pancreatic ductal dilatation. Spleen: No focal abnormality.  Normal size. Adrenals/Urinary Tract: Small cysts in the left kidney. No hydronephrosis. Adrenal glands and urinary bladder unremarkable. Stomach/Bowel: Large stool burden throughout the colon. Distention of the rectum with stool concerning for fecal impaction. Stomach and small bowel decompressed, unremarkable. Vascular/Lymphatic: Aortic atherosclerosis. No enlarged abdominal or pelvic lymph nodes. Reproductive: Prostate prominence. Other: No free fluid or free air. Musculoskeletal: Cachexia. No acute bony abnormality. Degenerative disc and facet disease in the lower lumbar spine. IMPRESSION: Masslike opacity in the left lower lobe as described on chest CT concerning for possible pulmonary mass lesions/malignancy. This could be further evaluated with bronchoscopy and/or PET CT as described on prior chest CT. Advanced emphysema. Large stool burden throughout the colon. Appearance is concerning for fecal impaction. Chronic pancreatitis changes.  No evidence of acute pancreatitis. Aortic atherosclerosis. Electronically Signed   By: Rolm Baptise M.D.   On: 06/17/2019 21:16   DG Abd 2 Views  Result Date: 05/23/2019 CLINICAL DATA:  Constipation. EXAM: ABDOMEN - 2 VIEW COMPARISON:  Chest x-ray 05/08/2019. FINDINGS: There is a moderate amount of stool identified within the left upper quadrant of the abdomen and in the rectum. No dilated loops of bowel. Asymmetric elevation of the left hemidiaphragm is noted. There is diminished aeration to the left midlung and left base. IMPRESSION: 1. Moderate amount of stool identified at the level of the splenic flexure and in the rectum which may reflect constipation.  2. No evidence for bowel obstruction. 3. Diminished aeration  to the left midlung and left base. Similar to chest radiograph from 05/08/2019 Electronically Signed   By: Kerby Moors M.D.   On: 05/23/2019 12:35    (Echo, Carotid, EGD, Colonoscopy, ERCP)    Subjective: Seen and examined on the day of discharge Feels well no complaints Alert, oriented x3 Calcium trending down Medically stable for discharge home  Discharge Exam: Vitals:   06/19/19 0752 06/19/19 1000  BP: 109/66   Pulse: 88   Resp: (!) 24 20  Temp: 98.4 F (36.9 C)   SpO2: 100%    Vitals:   06/19/19 0107 06/19/19 0542 06/19/19 0752 06/19/19 1000  BP: (!) 95/59 102/60 109/66   Pulse: 84 93 88   Resp: 14 (!) 23 (!) 24 20  Temp: 98.1 F (36.7 C)  98.4 F (36.9 C)   TempSrc: Oral  Oral   SpO2: 100% 98% 100%   Weight:      Height:        General: Pt is alert, awake, not in acute distress Cardiovascular: RRR, S1/S2 +, no rubs, no gallops Respiratory: CTA bilaterally, no wheezing, no rhonchi Abdominal: Soft, NT, ND, bowel sounds + Extremities: no edema, no cyanosis    The results of significant diagnostics from this hospitalization (including imaging, microbiology, ancillary and laboratory) are listed below for reference.     Microbiology: Recent Results (from the past 240 hour(s))  SARS CORONAVIRUS 2 (TAT 6-24 HRS) Nasopharyngeal Nasopharyngeal Swab     Status: None   Collection Time: 06/17/19  9:57 PM   Specimen: Nasopharyngeal Swab  Result Value Ref Range Status   SARS Coronavirus 2 NEGATIVE NEGATIVE Final    Comment: (NOTE) SARS-CoV-2 target nucleic acids are NOT DETECTED. The SARS-CoV-2 RNA is generally detectable in upper and lower respiratory specimens during the acute phase of infection. Negative results do not preclude SARS-CoV-2 infection, do not rule out co-infections with other pathogens, and should not be used as the sole basis for treatment or other patient management decisions. Negative results must be combined with clinical  observations, patient history, and epidemiological information. The expected result is Negative. Fact Sheet for Patients: SugarRoll.be Fact Sheet for Healthcare Providers: https://www.woods-mathews.com/ This test is not yet approved or cleared by the [place] FDA and  has been authorized for detection and/or diagnosis of SARS-CoV-2 by FDA under an Emergency Use Authorization (EUA). This EUA will remain  in effect (meaning this test can be used) for the duration of the COVID-19 declaration under Section 56 4(b)(1) of the Act, 21 U.S.C. section 360bbb-3(b)(1), unless the authorization is terminated or revoked sooner. Performed at [facility], [place] [address], [place], [place] [address]      Labs: BNP (last 3 results) No results for input(s): BNP in the last 8760 hours. Basic Metabolic Panel: Recent Labs  Lab 06/17/19 1511 06/17/19 2017 06/17/19 2345 06/18/19 0346 06/19/19 0833  NA 133*  --   --  135 135  K 4.3  --   --  4.5 3.8  CL 94*  --   --  102 104  CO2 23  --   --  27 23  GLUCOSE 126*  --   --  81 106*  BUN 32*  --   --  32* 25*  CREATININE 1.47*  --  1.27* 1.18 0.90  CALCIUM 12.5*  --   --  11.2* 10.4*  MG  --  2.3  --   --   --  PHOS  --  3.6  --   --   --    Liver Function Tests: Recent Labs  Lab 06/17/19 1511  AST 20  ALT 16  ALKPHOS 78  BILITOT 1.0  PROT 6.9  ALBUMIN 3.1*   No results for input(s): LIPASE, AMYLASE in the last 168 hours. No results for input(s): AMMONIA in the last 168 hours. CBC: Recent Labs  Lab 06/17/19 1511 06/17/19 2345 06/18/19 0346  WBC 19.6* 17.5* 16.5*  HGB 11.2* 9.3* 9.2*  HCT 33.3* 27.8* 27.7*  MCV 80.2 80.3 79.6*  PLT 710* 565* 585*   Cardiac Enzymes: No results for input(s): CKTOTAL, CKMB, CKMBINDEX, TROPONINI in the last 168 hours. BNP: Invalid input(s): POCBNP CBG: No results for input(s): GLUCAP in the last 168 hours. D-Dimer No results for input(s):  DDIMER in the last 72 hours. Hgb A1c No results for input(s): HGBA1C in the last 72 hours. Lipid Profile No results for input(s): CHOL, HDL, LDLCALC, TRIG, CHOLHDL, LDLDIRECT in the last 72 hours. Thyroid function studies No results for input(s): TSH, T4TOTAL, T3FREE, THYROIDAB in the last 72 hours.  Invalid input(s): FREET3 Anemia work up No results for input(s): VITAMINB12, FOLATE, FERRITIN, TIBC, IRON, RETICCTPCT in the last 72 hours. Urinalysis    Component Value Date/Time   COLORURINE AMBER (A) 06/17/2019 2240   APPEARANCEUR HAZY (A) 06/17/2019 2240   APPEARANCEUR Clear 11/14/2016 1538   LABSPEC 1.038 (H) 06/17/2019 2240   PHURINE 5.0 06/17/2019 2240   GLUCOSEU NEGATIVE 06/17/2019 2240   HGBUR NEGATIVE 06/17/2019 2240   BILIRUBINUR NEGATIVE 06/17/2019 2240   BILIRUBINUR Negative 11/14/2016 1538   KETONESUR NEGATIVE 06/17/2019 2240   PROTEINUR NEGATIVE 06/17/2019 2240   NITRITE NEGATIVE 06/17/2019 2240   LEUKOCYTESUR TRACE (A) 06/17/2019 2240   Sepsis Labs Invalid input(s): PROCALCITONIN,  WBC,  LACTICIDVEN Microbiology Recent Results (from the past 240 hour(s))  SARS CORONAVIRUS 2 (TAT 6-24 HRS) Nasopharyngeal Nasopharyngeal Swab     Status: None   Collection Time: 06/17/19  9:57 PM   Specimen: Nasopharyngeal Swab  Result Value Ref Range Status   SARS Coronavirus 2 NEGATIVE NEGATIVE Final    Comment: (NOTE) SARS-CoV-2 target nucleic acids are NOT DETECTED. The SARS-CoV-2 RNA is generally detectable in upper and lower respiratory specimens during the acute phase of infection. Negative results do not preclude SARS-CoV-2 infection, do not rule out co-infections with other pathogens, and should not be used as the sole basis for treatment or other patient management decisions. Negative results must be combined with clinical observations, patient history, and epidemiological information. The expected result is Negative. Fact Sheet for  Patients: SugarRoll.be Fact Sheet for Healthcare Providers: https://www.woods-mathews.com/ This test is not yet approved or cleared by the [place] FDA and  has been authorized for detection and/or diagnosis of SARS-CoV-2 by FDA under an Emergency Use Authorization (EUA). This EUA will remain  in effect (meaning this test can be used) for the duration of the COVID-19 declaration under Section 56 4(b)(1) of the Act, 21 U.S.C. section 360bbb-3(b)(1), unless the authorization is terminated or revoked sooner. Performed at [facility], [place] [address], [place], [place] [address]      Time coordinating discharge: Over 30 minutes  SIGNED:   Sidney Ace, MD  [facility] 06/19/2019, 1:23 PM Pager   If 7PM-7AM, please contact night-coverage [email or web address] Password [email or web address]

## 2019-06-19 NOTE — TOC Transition Note (Signed)
Transition of Care [facility]) - CM/SW Discharge Note   Patient Details  Name: Corey Carr MRN: [id number] Date of Birth: August 23, 1940  [facility] CM/SW Contact:  Elease Hashimoto, LCSW Phone Number: 06/19/2019, 1:11 PM   Clinical Narrative:  Made wife and pt aware have found [place] agency [facility] to take him for follow up services. No other follow up needs. CSW will sign off ready for discharge as long as medically ready.     Final next level of care: Home w Home Health Services Barriers to Discharge: Barriers Resolved   Patient Goals and CMS Choice Patient states their goals for this hospitalization and ongoing recovery are:: go home      Discharge Placement                  Name of family member notified: Wife in room and aware of plans    Discharge Plan and Services   Discharge Planning Services: CM Consult            DME Arranged: N/A         HH Arranged: PT, OT HH Agency: [place] Date [place]: 06/19/19 Time HH Agency Contacted: 1000 Representative spoke with at [place]: [place] Determinants of Health ([place]) Interventions     Readmission Risk Interventions No flowsheet data found.

## 2019-06-19 NOTE — Progress Notes (Signed)
PROGRESS NOTE    Corey Carr  ZOX:[id number] DOB: 19-Jan-1941 DOA: 06/17/2019 PCP: [name], MD    Brief Narrative:  Corey Carr is a 79 y.o. male with medical history significant for known lung mass x1 month with ongoing unintentional weight loss, suspected lung cancer, COPD and hypertension, with history of COVID-19 pneumonia in 05/08/19, hospitalized in January 2021 with hypercalcemia, dehydration with acute kidney injury, who was sent by his PCP for hypercalcemia of 11.7 after presenting with persistent weakness with poor oral intake and shortness of breath.  Wife at bedside and answers questions.  Patient too lethargic to contribute to history   1/27: Patient seen and examined.  Wife at bedside.  Patient much more awake this morning.  Alert and oriented x3.  No specific complaints.  Oncology physician in the room at time of my evaluation.  Plan to stabilize patient from a hospital standpoint and have him follow-up with cancer center for further work-up regarding the newly diagnosed lung mass  1/28: Patient seen and examined.  Wife at bedside.  Social work looking for home health options.  Patient improved symptomatically.  Much more awake this morning.  Multiple BMs over interval.  Calcium downtrending after ZA administration and fluids yesterday   Assessment & Plan:   Principal Problem:   Hypercalcemia of malignancy Active Problems:   AKI (acute kidney injury) ([place])   Lung mass   Essential hypertension   COPD with chronic bronchitis (HCC)   History of 2019 novel coronavirus disease (COVID-19)   Cancer cachexia (HCC)   Acute metabolic encephalopathy   Protein-calorie malnutrition, severe  Hypercalcemia of suspected malignancy Patient calcium is approximately 12 presentation and is downtrending Received aggressive intravenous fluids at 200 cc/h  S/p Zometa infusion Calcium downtrending Symptoms improved Medically stable for discharge   AKI (acute kidney injury)  ([place]), resolved In 1.47 above baseline of 0.88 Likely secondary to poor oral intake related to suspected malignancy Improving over interval Monitor renal function Avoid nephrotoxins  Acute metabolic encephalopathy, improving Patient lethargic on admission Likely due to hypercalcemia Much more awake today Continue regular diet    Lung mass, suspect malignancy Outpatient biopsy arranged Oncology at bedside 1/27 Patient will follow up with cancer center for further evaluation and management    Cancer cachexia [facility]) Protein calorie malnutrition, suspect severe Nutritionist evaluation Physical therapy and Occupational Therapy evaluations Patient likely will benefit from skilled nursing facility placement versus home health Physical therapy recommending home health, social work aware  Essential hypertension Blood pressure stable off meds    COPD with chronic bronchitis ([place]) Not acutely exacerbated Bronchodilator therapy as needed    History of 2019 novel coronavirus disease (COVID-19) Patient was hospitalized in December for Covid pneumonia and completed a remdesivir course Repeat Covid test pending. No clinical indication of acute Covid infection   DVT prophylaxis: lovenox Code Status: dnr  Family Communication: wife, annie Grecco.  At bedside Disposition Plan:  Anticipate home with home health    Consultants:   Oncology-[place]  Procedures:   none  Antimicrobials:   none   Subjective: Patient seen and examined Much more awake this morning Wife at bedside No complaints  Objective: Vitals:   06/18/19 1728 06/19/19 0107 06/19/19 0542 06/19/19 0752  BP: 101/84 (!) 95/59 102/60 109/66  Pulse: 89 84 93 88  Resp:  14 (!) 23 (!) 24  Temp: 97.8 F (36.6 C) 98.1 F (36.7 C)  98.4 F (36.9 C)  TempSrc: Oral Oral  Oral  SpO2: 100% 100%  98% 100%  Weight:      Height:        Intake/Output Summary (Last 24 hours) at 06/19/2019 1002 Last data filed  at 06/19/2019 0956 Gross per 24 hour  Intake 780 ml  Output 951 ml  Net -171 ml   Filed Weights   06/17/19 1509 06/18/19 0220  Weight: 53 kg 42.3 kg    Examination:  General exam: appears fatigued, appears older than stated age Respiratory system: Clear to auscultation. Respiratory effort normal. Cardiovascular system: S1 & S2 heard, RRR. No JVD, murmurs, rubs, gallops or clicks. No pedal edema. Gastrointestinal system: Abdomen is nondistended, soft and nontender. No organomegaly or masses felt. Normal bowel sounds heard. Central nervous system: Alert and oriented. No focal neurological deficits. Extremities: Decreased muscle tone diffusely Skin: No rashes, lesions or ulcers Psychiatry: Judgement and insight appear normal. Mood & affect appropriate.     Data Reviewed: I have personally reviewed following labs and imaging studies  CBC: Recent Labs  Lab 06/17/19 1511 06/17/19 2345 06/18/19 0346  WBC 19.6* 17.5* 16.5*  HGB 11.2* 9.3* 9.2*  HCT 33.3* 27.8* 27.7*  MCV 80.2 80.3 79.6*  PLT 710* 565* 161*   Basic Metabolic Panel: Recent Labs  Lab 06/17/19 1511 06/17/19 2017 06/17/19 2345 06/18/19 0346 06/19/19 0833  NA 133*  --   --  135 135  K 4.3  --   --  4.5 3.8  CL 94*  --   --  102 104  CO2 23  --   --  27 23  GLUCOSE 126*  --   --  81 106*  BUN 32*  --   --  32* 25*  CREATININE 1.47*  --  1.27* 1.18 0.90  CALCIUM 12.5*  --   --  11.2* 10.4*  MG  --  2.3  --   --   --   PHOS  --  3.6  --   --   --    GFR: Estimated Creatinine Clearance: 40.5 mL/min (by C-G formula based on SCr of 0.9 mg/dL). Liver Function Tests: Recent Labs  Lab 06/17/19 1511  AST 20  ALT 16  ALKPHOS 78  BILITOT 1.0  PROT 6.9  ALBUMIN 3.1*   No results for input(s): LIPASE, AMYLASE in the last 168 hours. No results for input(s): AMMONIA in the last 168 hours. Coagulation Profile: No results for input(s): INR, PROTIME in the last 168 hours. Cardiac Enzymes: No results for  input(s): CKTOTAL, CKMB, CKMBINDEX, TROPONINI in the last 168 hours. BNP (last 3 results) No results for input(s): PROBNP in the last 8760 hours. HbA1C: No results for input(s): HGBA1C in the last 72 hours. CBG: No results for input(s): GLUCAP in the last 168 hours. Lipid Profile: No results for input(s): CHOL, HDL, LDLCALC, TRIG, CHOLHDL, LDLDIRECT in the last 72 hours. Thyroid Function Tests: No results for input(s): TSH, T4TOTAL, FREET4, T3FREE, THYROIDAB in the last 72 hours. Anemia Panel: No results for input(s): VITAMINB12, FOLATE, FERRITIN, TIBC, IRON, RETICCTPCT in the last 72 hours. Sepsis Labs: No results for input(s): PROCALCITON, LATICACIDVEN in the last 168 hours.  Recent Results (from the past 240 hour(s))  SARS CORONAVIRUS 2 (TAT 6-24 HRS) Nasopharyngeal Nasopharyngeal Swab     Status: None   Collection Time: 06/17/19  9:57 PM   Specimen: Nasopharyngeal Swab  Result Value Ref Range Status   SARS Coronavirus 2 NEGATIVE NEGATIVE Final    Comment: (NOTE) SARS-CoV-2 target nucleic acids are NOT DETECTED. The SARS-CoV-2 RNA  is generally detectable in upper and lower respiratory specimens during the acute phase of infection. Negative results do not preclude SARS-CoV-2 infection, do not rule out co-infections with other pathogens, and should not be used as the sole basis for treatment or other patient management decisions. Negative results must be combined with clinical observations, patient history, and epidemiological information. The expected result is Negative. Fact Sheet for Patients: SugarRoll.be Fact Sheet for Healthcare Providers: https://www.woods-mathews.com/ This test is not yet approved or cleared by the [place] FDA and  has been authorized for detection and/or diagnosis of SARS-CoV-2 by FDA under an Emergency Use Authorization (EUA). This EUA will remain  in effect (meaning this test can be used) for the  duration of the COVID-19 declaration under Section 56 4(b)(1) of the Act, 21 U.S.C. section 360bbb-3(b)(1), unless the authorization is terminated or revoked sooner. Performed at [facility], [place] [address], [place], [place] [address]          Radiology Studies: DG Chest 2 View  Result Date: 06/17/2019 CLINICAL DATA:  Dizziness and shortness of breath. EXAM: CHEST - 2 VIEW COMPARISON:  05/23/2019. FINDINGS: Similar appearance of volume loss in the left hemithorax with elevation of the left hemidiaphragm and left base collapse/consolidative opacity. There is diffuse interstitial and pleural opacity in the left chest. Right lung is hyperexpanded without focal consolidation or pleural effusion. Bones are diffusely demineralized. IMPRESSION: Largely stable exam. Marked volume loss left hemithorax with posterior collapse/consolidative change in diffuse pleural thickening. Electronically Signed   By: Misty Stanley M.D.   On: 06/17/2019 16:43   CT Angio Chest PE W/Cm &/Or Wo Cm  Result Date: 06/17/2019 CLINICAL DATA:  Shortness of breath EXAM: CT ANGIOGRAPHY CHEST WITH CONTRAST TECHNIQUE: Multidetector CT imaging of the chest was performed using the standard protocol during bolus administration of intravenous contrast. Multiplanar CT image reconstructions and MIPs were obtained to evaluate the vascular anatomy. CONTRAST:  3mL OMNIPAQUE IOHEXOL 350 MG/ML SOLN COMPARISON:  03/06/2019 FINDINGS: Cardiovascular: No filling defects in the pulmonary arteries to suggest pulmonary emboli. Heart is normal size. Aorta normal caliber. Aortic and coronary artery atherosclerosis. Mediastinum/Nodes: There is pneumomediastinum. Gas seen around the main pulmonary artery and in the posterior mediastinum. No adenopathy. Lungs/Pleura: Volume loss on the left with elevation of the left hemidiaphragm. Findings are chronic. There is a chronic masslike area of consolidation in the left lower lobe which is similar to  prior study. Advanced centrilobular emphysema and areas of scarring in both lungs. Possible small left effusion, stable. Upper Abdomen: Imaging into the upper abdomen shows no acute findings. Musculoskeletal: Cachexia.  No acute bony abnormality. Review of the MIP images confirms the above findings. IMPRESSION: Pneumomediastinum seen in the middle and posterior mediastinum. No associated pneumothorax. Advanced centrilobular emphysema. Stable chronic volume loss on the left with elevation of the left hemidiaphragm and masslike consolidation in the left lower lobe. As recommended on prior study, bronchoscopy with attention to the left lower lobe and/or PET CT may be helpful to exclude left lower lobe mass lesion. Small right pleural effusion. Areas of scarring in the lungs bilaterally. Aortic Atherosclerosis (ICD10-I70.0) and Emphysema (ICD10-J43.9). Electronically Signed   By: Rolm Baptise M.D.   On: 06/17/2019 21:12   CT Abdomen Pelvis W Contrast  Result Date: 06/17/2019 CLINICAL DATA:  Hypercalcemia. EXAM: CT ABDOMEN AND PELVIS WITH CONTRAST TECHNIQUE: Multidetector CT imaging of the abdomen and pelvis was performed using the standard protocol following bolus administration of intravenous contrast. CONTRAST:  54mL  OMNIPAQUE IOHEXOL 350 MG/ML SOLN COMPARISON:  None. FINDINGS: Lower chest: Masslike opacity seen in the left lower lobe as described on prior chest CT. Trace left pleural effusion. Volume loss on the left with elevation of the left hemidiaphragm. Advanced emphysema. Scarring in the right lower lobe. Hepatobiliary: No focal hepatic abnormality. Gallbladder unremarkable. Pancreas: Calcifications throughout the pancreas compatible with chronic pancreatitis. No evidence of acute pancreatitis, fall suspicious pancreatic lesion or pancreatic ductal dilatation. Spleen: No focal abnormality.  Normal size. Adrenals/Urinary Tract: Small cysts in the left kidney. No hydronephrosis. Adrenal glands and urinary  bladder unremarkable. Stomach/Bowel: Large stool burden throughout the colon. Distention of the rectum with stool concerning for fecal impaction. Stomach and small bowel decompressed, unremarkable. Vascular/Lymphatic: Aortic atherosclerosis. No enlarged abdominal or pelvic lymph nodes. Reproductive: Prostate prominence. Other: No free fluid or free air. Musculoskeletal: Cachexia. No acute bony abnormality. Degenerative disc and facet disease in the lower lumbar spine. IMPRESSION: Masslike opacity in the left lower lobe as described on chest CT concerning for possible pulmonary mass lesions/malignancy. This could be further evaluated with bronchoscopy and/or PET CT as described on prior chest CT. Advanced emphysema. Large stool burden throughout the colon. Appearance is concerning for fecal impaction. Chronic pancreatitis changes.  No evidence of acute pancreatitis. Aortic atherosclerosis. Electronically Signed   By: Rolm Baptise M.D.   On: 06/17/2019 21:16        Scheduled Meds:  aspirin  325 mg Oral Daily   enoxaparin (LOVENOX) injection  30 mg Subcutaneous Q24H   feeding supplement (ENSURE ENLIVE)  237 mL Oral TID BM   fluticasone furoate-vilanterol  1 puff Inhalation Daily   And   umeclidinium bromide  1 puff Inhalation Daily   loratadine  10 mg Oral Daily   megestrol  400 mg Oral Daily   pantoprazole  40 mg Oral Daily   senna-docusate  1 tablet Oral BID   Continuous Infusions:   LOS: 2 days    Time spent: 35 minutes    Sidney Ace, MD [facility] Pager [phone]  If 7PM-7AM, please contact night-coverage [email or web address] 06/19/2019, 10:02 AM

## 2019-06-19 NOTE — TOC Progression Note (Signed)
Transition of Care [facility]) - Progression Note    Patient Details  Name: Corey Carr MRN: [id number] Date of Birth: [birth year]  Transition of [facility] CM/SW Contact  Callie Bunyard, Gardiner Rhyme, LCSW Phone Number: 06/19/2019, 10:31 AM  Clinical Narrative: Have spoken with [facility] care who is not able to take the referral. Discussed with wife plan and option of OP. She reports this would be difficult with his HD and all his medical appointments. Asked if worker can call [facility] and see if they would take the case and see them this time. Contacted Cheryl-[facility] and she will take his referral and make it right this time and see him. Have informed wife and she is pleased with this plan. They have all needed equipment at home and have no needs. Await medical readiness for discharge.    Expected Discharge Plan: [place] Barriers to Discharge: Continued Medical Work up  Expected Discharge Plan and Services Expected Discharge Plan: [place]   Discharge Planning Services: CM Consult   Living arrangements for the past 2 months: Single Family Home                 DME Arranged: N/A         HH Arranged: NA           Social Determinants of Health (SDOH) Interventions    Readmission Risk Interventions No flowsheet data found.

## 2019-06-20 LAB — CALCIUM, IONIZED: Calcium, Ionized, Serum: 6.4 mg/dL — ABNORMAL HIGH (ref 4.5–5.6)

## 2019-06-20 NOTE — Progress Notes (Signed)
[place]  [phone] [phone]) [phone]  ID: Corey Carr OB: [birth year]  MR#: [id number]  UXN#:[id number]  Patient Care Team: [name], MD as PCP - General (Family Medicine) Telford Nab, RN as Registered Nurse  CHIEF COMPLAINT: Left lower lobe lung mass, hypercalcemia of malignancy.  INTERVAL HISTORY: Patient is a 79 year old male who was initially evaluated in the hospital.  He was admitted with significant weight loss, declining performance status, and persistent weakness and fatigue.  He was found to have a significantly elevated calcium level as well as a left lower lobe lung mass.  Patient continues to have a poor appetite.  He is also complaining of persistent rectal pain.  He has no neurologic complaints.  He denies any fevers.  He has no chest pain, shortness of breath, cough, or hemoptysis.  He denies any nausea, vomiting, or constipation.  He does complain of occasional diarrhea.  He has no urinary complaints.  Patient feels generally terrible, but offers no further specific complaints today.  REVIEW OF SYSTEMS:   Review of Systems  Constitutional: Positive for malaise/fatigue and weight loss. Negative for fever.  Respiratory: Negative.  Negative for cough and hemoptysis.   Cardiovascular: Negative.  Negative for chest pain and leg swelling.  Gastrointestinal: Positive for diarrhea. Negative for abdominal pain, blood in stool, constipation and melena.       Rectal pain.  Genitourinary: Negative for dysuria.  Musculoskeletal: Negative.  Negative for back pain.  Skin: Negative.  Negative for rash.  Neurological: Positive for weakness. Negative for dizziness, focal weakness and headaches.  Psychiatric/Behavioral: The patient is not nervous/anxious.     As per HPI. Otherwise, a complete review of systems is negative.  PAST MEDICAL HISTORY: Past Medical History:  Diagnosis Date  . Hypertension     PAST SURGICAL HISTORY: Past  Surgical History:  Procedure Laterality Date  . KNEE SURGERY Right 1957    FAMILY HISTORY: Family History  Problem Relation Age of Onset  . Emphysema Father   . Lung cancer Maternal Uncle     ADVANCED DIRECTIVES (Y/N):  N  HEALTH MAINTENANCE: Social History   Tobacco Use  . Smoking status: Former Smoker    Packs/day: 0.50    Years: 60.00    Pack years: 30.00    Types: Cigarettes    Quit date: 2015    Years since quitting: 6.0  . Smokeless tobacco: Never Used  Substance Use Topics  . Alcohol use: No  . Drug use: No     Colonoscopy:  PAP:  Bone density:  Lipid panel:  No Known Allergies  Current Outpatient Medications  Medication Sig Dispense Refill  . albuterol (VENTOLIN HFA) 108 (90 Base) MCG/ACT inhaler Inhale 2 puffs into the lungs See admin instructions. Inhale 2 puffs into the lungs every 3 hours as needed for wheezing.    Marland Kitchen aspirin 325 MG tablet Take 1 tablet by mouth daily.    Marland Kitchen esomeprazole (NEXIUM) 20 MG capsule Take 20 mg by mouth daily at 12 noon.    . feeding supplement, ENSURE ENLIVE, (ENSURE ENLIVE) LIQD Take 237 mLs by mouth 3 (three) times daily between meals. 237 mL 12  . fluticasone (FLONASE) 50 MCG/ACT nasal spray Place 2 sprays into both nostrils daily as needed for allergies or rhinitis.    . Fluticasone-Umeclidin-Vilant (TRELEGY ELLIPTA) 100-62.5-25 MCG/INH AEPB Inhale 1 puff into the lungs daily.    Marland Kitchen loratadine (ALLERGY RELIEF) 10 MG tablet Take 1 tablet (10 mg total) by mouth daily.  30 tablet 1  . megestrol (MEGACE) 40 MG tablet Take 40 mg by mouth daily.    . meloxicam (MOBIC) 15 MG tablet Take 15 mg by mouth daily as needed for pain.    Marland Kitchen omega-3 acid ethyl esters (LOVAZA) 1 g capsule Take 1 g by mouth daily.    . polyethylene glycol powder (GLYCOLAX/MIRALAX) 17 GM/SCOOP powder Take 17 g by mouth 2 (two) times daily. Mix in 4-8 ounces of fluid prior to taking.    . senna-docusate (SENOKOT-S) 8.6-50 MG tablet Take 1 tablet by mouth at  bedtime as needed for mild constipation. 30 tablet 0  . vitamin B-12 (CYANOCOBALAMIN) 500 MCG tablet Take 500 mcg by mouth daily.    . vitamin C (ASCORBIC ACID) 250 MG tablet Take 500 mg by mouth daily.     . vitamin E (VITAMIN E) 400 UNIT capsule Take 400 Units by mouth daily.    . [employer] GLYCERIN ADULT 2 g suppository SMARTSIG:1 SUPPOS Rectally As Needed    . dexamethasone (DECADRON) 4 MG tablet Take 1 tablet (4 mg total) by mouth daily. 30 tablet 1  . HYDROcodone-acetaminophen (NORCO/VICODIN) 5-325 MG tablet Take 1 tablet by mouth every 6 (six) hours as needed. 30 tablet 0   No current facility-administered medications for this visit.    OBJECTIVE: Vitals:   06/24/19 1121 06/24/19 1200  BP: 111/85   Pulse: (!) 104   Resp: 20   Temp: 98.5 F (36.9 C)   SpO2:  95%     Body mass index is 14.14 kg/m.    ECOG FS:2 - Symptomatic, <50% confined to bed  General: Thin, no acute distress. Eyes: Pink conjunctiva, anicteric sclera. HEENT: Normocephalic, moist mucous membranes. Lungs: No audible wheezing or coughing. Heart: Regular rate and rhythm. Abdomen: Soft, nontender, no obvious distention. Musculoskeletal: No edema, cyanosis, or clubbing. Neuro: Alert, answering all questions appropriately. Cranial nerves grossly intact. Skin: No rashes or petechiae noted. Psych: Normal affect. Lymphatics: No cervical, calvicular, axillary or inguinal LAD.   LAB RESULTS:  Lab Results  Component Value Date   NA 137 06/24/2019   K 3.9 06/24/2019   CL 104 06/24/2019   CO2 20 (L) 06/24/2019   GLUCOSE 97 06/24/2019   BUN 21 06/24/2019   CREATININE 0.98 06/24/2019   CALCIUM 9.1 06/24/2019   PROT 6.9 06/24/2019   ALBUMIN 3.1 (L) 06/24/2019   AST 18 06/24/2019   ALT 17 06/24/2019   ALKPHOS 85 06/24/2019   BILITOT 0.8 06/24/2019   GFRNONAA >60 06/24/2019   GFRAA >60 06/24/2019    Lab Results  Component Value Date   WBC 15.8 (H) 06/24/2019   NEUTROABS 12.1 (H) 06/24/2019   HGB 10.3 (L)  06/24/2019   HCT 31.9 (L) 06/24/2019   MCV 82.0 06/24/2019   PLT 643 (H) 06/24/2019     STUDIES: DG Chest 2 View  Result Date: 06/17/2019 CLINICAL DATA:  Dizziness and shortness of breath. EXAM: CHEST - 2 VIEW COMPARISON:  05/23/2019. FINDINGS: Similar appearance of volume loss in the left hemithorax with elevation of the left hemidiaphragm and left base collapse/consolidative opacity. There is diffuse interstitial and pleural opacity in the left chest. Right lung is hyperexpanded without focal consolidation or pleural effusion. Bones are diffusely demineralized. IMPRESSION: Largely stable exam. Marked volume loss left hemithorax with posterior collapse/consolidative change in diffuse pleural thickening. Electronically Signed   By: Misty Stanley M.D.   On: 06/17/2019 16:43   CT Angio Chest PE W/Cm &/Or Wo Cm  Result  Date: 06/17/2019 CLINICAL DATA:  Shortness of breath EXAM: CT ANGIOGRAPHY CHEST WITH CONTRAST TECHNIQUE: Multidetector CT imaging of the chest was performed using the standard protocol during bolus administration of intravenous contrast. Multiplanar CT image reconstructions and MIPs were obtained to evaluate the vascular anatomy. CONTRAST:  32mL OMNIPAQUE IOHEXOL 350 MG/ML SOLN COMPARISON:  03/06/2019 FINDINGS: Cardiovascular: No filling defects in the pulmonary arteries to suggest pulmonary emboli. Heart is normal size. Aorta normal caliber. Aortic and coronary artery atherosclerosis. Mediastinum/Nodes: There is pneumomediastinum. Gas seen around the main pulmonary artery and in the posterior mediastinum. No adenopathy. Lungs/Pleura: Volume loss on the left with elevation of the left hemidiaphragm. Findings are chronic. There is a chronic masslike area of consolidation in the left lower lobe which is similar to prior study. Advanced centrilobular emphysema and areas of scarring in both lungs. Possible small left effusion, stable. Upper Abdomen: Imaging into the upper abdomen shows no acute  findings. Musculoskeletal: Cachexia.  No acute bony abnormality. Review of the MIP images confirms the above findings. IMPRESSION: Pneumomediastinum seen in the middle and posterior mediastinum. No associated pneumothorax. Advanced centrilobular emphysema. Stable chronic volume loss on the left with elevation of the left hemidiaphragm and masslike consolidation in the left lower lobe. As recommended on prior study, bronchoscopy with attention to the left lower lobe and/or PET CT may be helpful to exclude left lower lobe mass lesion. Small right pleural effusion. Areas of scarring in the lungs bilaterally. Aortic Atherosclerosis (ICD10-I70.0) and Emphysema (ICD10-J43.9). Electronically Signed   By: Rolm Baptise M.D.   On: 06/17/2019 21:12   CT Abdomen Pelvis W Contrast  Result Date: 06/17/2019 CLINICAL DATA:  Hypercalcemia. EXAM: CT ABDOMEN AND PELVIS WITH CONTRAST TECHNIQUE: Multidetector CT imaging of the abdomen and pelvis was performed using the standard protocol following bolus administration of intravenous contrast. CONTRAST:  5mL OMNIPAQUE IOHEXOL 350 MG/ML SOLN COMPARISON:  None. FINDINGS: Lower chest: Masslike opacity seen in the left lower lobe as described on prior chest CT. Trace left pleural effusion. Volume loss on the left with elevation of the left hemidiaphragm. Advanced emphysema. Scarring in the right lower lobe. Hepatobiliary: No focal hepatic abnormality. Gallbladder unremarkable. Pancreas: Calcifications throughout the pancreas compatible with chronic pancreatitis. No evidence of acute pancreatitis, fall suspicious pancreatic lesion or pancreatic ductal dilatation. Spleen: No focal abnormality.  Normal size. Adrenals/Urinary Tract: Small cysts in the left kidney. No hydronephrosis. Adrenal glands and urinary bladder unremarkable. Stomach/Bowel: Large stool burden throughout the colon. Distention of the rectum with stool concerning for fecal impaction. Stomach and small bowel decompressed,  unremarkable. Vascular/Lymphatic: Aortic atherosclerosis. No enlarged abdominal or pelvic lymph nodes. Reproductive: Prostate prominence. Other: No free fluid or free air. Musculoskeletal: Cachexia. No acute bony abnormality. Degenerative disc and facet disease in the lower lumbar spine. IMPRESSION: Masslike opacity in the left lower lobe as described on chest CT concerning for possible pulmonary mass lesions/malignancy. This could be further evaluated with bronchoscopy and/or PET CT as described on prior chest CT. Advanced emphysema. Large stool burden throughout the colon. Appearance is concerning for fecal impaction. Chronic pancreatitis changes.  No evidence of acute pancreatitis. Aortic atherosclerosis. Electronically Signed   By: Rolm Baptise M.D.   On: 06/17/2019 21:16    ASSESSMENT: Left lower lobe lung mass, hypercalcemia of malignancy  PLAN:    1.  Left lower lobe lung mass: Highly suspicious for underlying malignancy.  CT scan results from June 16, 2018 reviewed independently and reported as above.  Have ordered PET scan as well  as CT-guided biopsy for further evaluation and diagnosis.  Patient will also be discussed at tumor board this coming Thursday.  2.  Hypercalcemia: Likely secondary to malignancy.  Resolved.  Patient received 4 mg IV Zometa on June 18, 2019. 3.  Anemia: Hemoglobin has trended up slightly to 10.3, monitor. 4.  Leukocytosis: Likely reactive, monitor. 5.  Thrombocytosis: Likely reactive, monitor. 6.  Poor appetite/weight loss: Patient was given a prescription for dexamethasone 4 mg daily. 7.  Rectal pain: Unclear etiology.  Patient was given a prescription for Vicodin today.  PET scan as above for further evaluation.   Patient expressed understanding and was in agreement with this plan. He also understands that He can call clinic at any time with any questions, concerns, or complaints.   Cancer Staging No matching staging information was found for the  patient.  Lloyd Huger, MD   06/24/2019 5:21 PM

## 2019-06-23 ENCOUNTER — Other Ambulatory Visit: Payer: Self-pay | Admitting: Emergency Medicine

## 2019-06-23 DIAGNOSIS — Z122 Encounter for screening for malignant neoplasm of respiratory organs: Secondary | ICD-10-CM

## 2019-06-23 DIAGNOSIS — R918 Other nonspecific abnormal finding of lung field: Secondary | ICD-10-CM

## 2019-06-24 ENCOUNTER — Other Ambulatory Visit: Payer: Self-pay | Admitting: *Deleted

## 2019-06-24 ENCOUNTER — Other Ambulatory Visit: Payer: Self-pay

## 2019-06-24 ENCOUNTER — Inpatient Hospital Stay (HOSPITAL_BASED_OUTPATIENT_CLINIC_OR_DEPARTMENT_OTHER): Payer: Medicare Other | Admitting: Oncology

## 2019-06-24 ENCOUNTER — Encounter: Payer: Self-pay | Admitting: *Deleted

## 2019-06-24 ENCOUNTER — Encounter: Payer: Self-pay | Admitting: Oncology

## 2019-06-24 ENCOUNTER — Inpatient Hospital Stay: Payer: Medicare Other | Attending: Oncology

## 2019-06-24 VITALS — BP 111/85 | HR 104 | Temp 98.5°F | Resp 20 | Wt 93.0 lb

## 2019-06-24 DIAGNOSIS — E162 Hypoglycemia, unspecified: Secondary | ICD-10-CM | POA: Diagnosis not present

## 2019-06-24 DIAGNOSIS — R918 Other nonspecific abnormal finding of lung field: Secondary | ICD-10-CM

## 2019-06-24 DIAGNOSIS — J189 Pneumonia, unspecified organism: Secondary | ICD-10-CM | POA: Diagnosis not present

## 2019-06-24 LAB — CBC WITH DIFFERENTIAL/PLATELET
Abs Immature Granulocytes: 0.08 10*3/uL — ABNORMAL HIGH (ref 0.00–0.07)
Basophils Absolute: 0.1 10*3/uL (ref 0.0–0.1)
Basophils Relative: 1 %
Eosinophils Absolute: 0.5 10*3/uL (ref 0.0–0.5)
Eosinophils Relative: 3 %
HCT: 31.9 % — ABNORMAL LOW (ref 39.0–52.0)
Hemoglobin: 10.3 g/dL — ABNORMAL LOW (ref 13.0–17.0)
Immature Granulocytes: 1 %
Lymphocytes Relative: 12 %
Lymphs Abs: 1.8 10*3/uL (ref 0.7–4.0)
MCH: 26.5 pg (ref 26.0–34.0)
MCHC: 32.3 g/dL (ref 30.0–36.0)
MCV: 82 fL (ref 80.0–100.0)
Monocytes Absolute: 1.2 10*3/uL — ABNORMAL HIGH (ref 0.1–1.0)
Monocytes Relative: 8 %
Neutro Abs: 12.1 10*3/uL — ABNORMAL HIGH (ref 1.7–7.7)
Neutrophils Relative %: 75 %
Platelets: 643 10*3/uL — ABNORMAL HIGH (ref 150–400)
RBC: 3.89 MIL/uL — ABNORMAL LOW (ref 4.22–5.81)
RDW: 18.4 % — ABNORMAL HIGH (ref 11.5–15.5)
WBC: 15.8 10*3/uL — ABNORMAL HIGH (ref 4.0–10.5)
nRBC: 0 % (ref 0.0–0.2)

## 2019-06-24 LAB — COMPREHENSIVE METABOLIC PANEL
ALT: 17 U/L (ref 0–44)
AST: 18 U/L (ref 15–41)
Albumin: 3.1 g/dL — ABNORMAL LOW (ref 3.5–5.0)
Alkaline Phosphatase: 85 U/L (ref 38–126)
Anion gap: 13 (ref 5–15)
BUN: 21 mg/dL (ref 8–23)
CO2: 20 mmol/L — ABNORMAL LOW (ref 22–32)
Calcium: 9.1 mg/dL (ref 8.9–10.3)
Chloride: 104 mmol/L (ref 98–111)
Creatinine, Ser: 0.98 mg/dL (ref 0.61–1.24)
GFR calc Af Amer: >60 mL/min (ref >60)
GFR calc non Af Amer: >60 mL/min (ref >60)
Glucose, Bld: 97 mg/dL (ref 70–99)
Potassium: 3.9 mmol/L (ref 3.5–5.1)
Sodium: 137 mmol/L (ref 135–145)
Total Bilirubin: 0.8 mg/dL (ref 0.3–1.2)
Total Protein: 6.9 g/dL (ref 6.5–8.1)

## 2019-06-24 MED ORDER — DEXAMETHASONE 4 MG PO TABS: 4.0000 mg | ORAL_TABLET | Freq: Every day | ORAL | 1 refills | Status: AC

## 2019-06-24 MED ORDER — HYDROCODONE-ACETAMINOPHEN 5-325 MG PO TABS: 1.0000 | ORAL_TABLET | Freq: Four times a day (QID) | ORAL | 0 refills | Status: DC | PRN

## 2019-06-24 NOTE — Progress Notes (Signed)
  Oncology Nurse Navigator Documentation  Navigator Location: [facility]Med Onc (06/24/19 1400)   )Navigator Encounter Type: Initial MedOnc (06/24/19 1400)   Abnormal Finding Date: 06/17/19 (06/24/19 1400)                   Treatment Phase: Abnormal Scans (06/24/19 1400) Barriers/Navigation Needs: Coordination of Care (06/24/19 1400)   Interventions: Coordination of Care (06/24/19 1400)   Coordination of Care: Appts;Radiology (06/24/19 1400)        Acuity: Level 2-Minimal Needs (1-2 Barriers Identified) (06/24/19 1400)  met with patient and his wife during hospital follow up visit with Dr. Grayland Ormond to discuss next steps. All questions answered during visit. Reviewed upcoming appts with pt and his wife. Pt stated that prefers appts to be scheduled on Mon, Wed, and Fri due to wife having dialysis on Tues, Thurs, and Sat. Informed that we will notify him by phone once his biopsy is scheduled and will try to get scheduled on Mon, Wed, or Fri. Contact info given and instructed to call with any further questions or needs. Pt and wife verbalized understanding. Nothing further needed at this time.       Time Spent with Patient: 60 (06/24/19 1400)

## 2019-06-24 NOTE — Progress Notes (Signed)
Patient reports feeling better since being discharged from the hospital. Reports SOB, no appetite, and diarrhea. He would like an oxygen tank/support

## 2019-06-25 ENCOUNTER — Inpatient Hospital Stay
Admission: EM | Admit: 2019-06-25 | Discharge: 2019-06-30 | DRG: 166 | Disposition: A | Discharge status: Home Health | Payer: Medicare Other | Attending: Internal Medicine | Admitting: Internal Medicine

## 2019-06-25 ENCOUNTER — Encounter: Payer: Self-pay | Admitting: Urology

## 2019-06-25 ENCOUNTER — Other Ambulatory Visit: Payer: Self-pay

## 2019-06-25 ENCOUNTER — Encounter: Payer: Self-pay | Admitting: Emergency Medicine

## 2019-06-25 ENCOUNTER — Encounter
Admission: RE | Admit: 2019-06-25 | Discharge: 2019-06-25 | Disposition: A | Discharge status: Home / Self Care | Payer: Medicare Other | Source: Ambulatory Visit | Attending: Oncology | Admitting: Oncology

## 2019-06-25 ENCOUNTER — Other Ambulatory Visit: Payer: Medicare Other

## 2019-06-25 ENCOUNTER — Emergency Department: Payer: Medicare Other

## 2019-06-25 DIAGNOSIS — R64 Cachexia: Secondary | ICD-10-CM | POA: Diagnosis not present

## 2019-06-25 DIAGNOSIS — D72829 Elevated white blood cell count, unspecified: Secondary | ICD-10-CM | POA: Diagnosis present

## 2019-06-25 DIAGNOSIS — R627 Adult failure to thrive: Secondary | ICD-10-CM | POA: Diagnosis present

## 2019-06-25 DIAGNOSIS — E162 Hypoglycemia, unspecified: Secondary | ICD-10-CM | POA: Diagnosis not present

## 2019-06-25 DIAGNOSIS — R6251 Failure to thrive (child): Secondary | ICD-10-CM | POA: Diagnosis present

## 2019-06-25 DIAGNOSIS — Z791 Long term (current) use of non-steroidal anti-inflammatories (NSAID): Secondary | ICD-10-CM

## 2019-06-25 DIAGNOSIS — Z8616 Personal history of COVID-19: Secondary | ICD-10-CM

## 2019-06-25 DIAGNOSIS — J189 Pneumonia, unspecified organism: Secondary | ICD-10-CM | POA: Diagnosis not present

## 2019-06-25 DIAGNOSIS — C3412 Malignant neoplasm of upper lobe, left bronchus or lung: Secondary | ICD-10-CM | POA: Diagnosis present

## 2019-06-25 DIAGNOSIS — R680 Hypothermia, not associated with low environmental temperature: Secondary | ICD-10-CM | POA: Diagnosis present

## 2019-06-25 DIAGNOSIS — Z801 Family history of malignant neoplasm of trachea, bronchus and lung: Secondary | ICD-10-CM

## 2019-06-25 DIAGNOSIS — Z7982 Long term (current) use of aspirin: Secondary | ICD-10-CM

## 2019-06-25 DIAGNOSIS — R6510 Systemic inflammatory response syndrome (SIRS) of non-infectious origin without acute organ dysfunction: Secondary | ICD-10-CM | POA: Diagnosis present

## 2019-06-25 DIAGNOSIS — L8990 Pressure ulcer of unspecified site, unspecified stage: Secondary | ICD-10-CM | POA: Insufficient documentation

## 2019-06-25 DIAGNOSIS — Z66 Do not resuscitate: Secondary | ICD-10-CM | POA: Diagnosis not present

## 2019-06-25 DIAGNOSIS — Z681 Body mass index (BMI) 19 or less, adult: Secondary | ICD-10-CM

## 2019-06-25 DIAGNOSIS — E43 Unspecified severe protein-calorie malnutrition: Secondary | ICD-10-CM | POA: Diagnosis present

## 2019-06-25 DIAGNOSIS — Z87891 Personal history of nicotine dependence: Secondary | ICD-10-CM

## 2019-06-25 DIAGNOSIS — Z802 Family history of malignant neoplasm of other respiratory and intrathoracic organs: Secondary | ICD-10-CM

## 2019-06-25 DIAGNOSIS — I9589 Other hypotension: Secondary | ICD-10-CM | POA: Diagnosis present

## 2019-06-25 DIAGNOSIS — Z7951 Long term (current) use of inhaled steroids: Secondary | ICD-10-CM

## 2019-06-25 DIAGNOSIS — J9621 Acute and chronic respiratory failure with hypoxia: Secondary | ICD-10-CM

## 2019-06-25 DIAGNOSIS — I480 Paroxysmal atrial fibrillation: Secondary | ICD-10-CM

## 2019-06-25 DIAGNOSIS — I4892 Unspecified atrial flutter: Secondary | ICD-10-CM

## 2019-06-25 DIAGNOSIS — C3492 Malignant neoplasm of unspecified part of left bronchus or lung: Secondary | ICD-10-CM

## 2019-06-25 DIAGNOSIS — R918 Other nonspecific abnormal finding of lung field: Secondary | ICD-10-CM

## 2019-06-25 DIAGNOSIS — J449 Chronic obstructive pulmonary disease, unspecified: Secondary | ICD-10-CM | POA: Diagnosis not present

## 2019-06-25 DIAGNOSIS — Z79899 Other long term (current) drug therapy: Secondary | ICD-10-CM

## 2019-06-25 DIAGNOSIS — I471 Supraventricular tachycardia: Secondary | ICD-10-CM | POA: Diagnosis present

## 2019-06-25 DIAGNOSIS — I10 Essential (primary) hypertension: Secondary | ICD-10-CM | POA: Diagnosis present

## 2019-06-25 DIAGNOSIS — D638 Anemia in other chronic diseases classified elsewhere: Secondary | ICD-10-CM | POA: Diagnosis present

## 2019-06-25 HISTORY — DX: Unspecified atrial flutter: I48.92

## 2019-06-25 LAB — COMPREHENSIVE METABOLIC PANEL
ALT: 17 U/L (ref 0–44)
AST: 19 U/L (ref 15–41)
Albumin: 3 g/dL — ABNORMAL LOW (ref 3.5–5.0)
Alkaline Phosphatase: 82 U/L (ref 38–126)
Anion gap: 11 (ref 5–15)
BUN: 31 mg/dL — ABNORMAL HIGH (ref 8–23)
CO2: 22 mmol/L (ref 22–32)
Calcium: 8.6 mg/dL — ABNORMAL LOW (ref 8.9–10.3)
Chloride: 103 mmol/L (ref 98–111)
Creatinine, Ser: 1.12 mg/dL (ref 0.61–1.24)
GFR calc Af Amer: >60 mL/min (ref >60)
GFR calc non Af Amer: >60 mL/min (ref >60)
Glucose, Bld: 195 mg/dL — ABNORMAL HIGH (ref 70–99)
Potassium: 4.1 mmol/L (ref 3.5–5.1)
Sodium: 136 mmol/L (ref 135–145)
Total Bilirubin: 0.7 mg/dL (ref 0.3–1.2)
Total Protein: 6.9 g/dL (ref 6.5–8.1)

## 2019-06-25 LAB — CBC
HCT: 30 % — ABNORMAL LOW (ref 39.0–52.0)
Hemoglobin: 10.1 g/dL — ABNORMAL LOW (ref 13.0–17.0)
MCH: 27 pg (ref 26.0–34.0)
MCHC: 33.7 g/dL (ref 30.0–36.0)
MCV: 80.2 fL (ref 80.0–100.0)
Platelets: 719 10*3/uL — ABNORMAL HIGH (ref 150–400)
RBC: 3.74 MIL/uL — ABNORMAL LOW (ref 4.22–5.81)
RDW: 18.3 % — ABNORMAL HIGH (ref 11.5–15.5)
WBC: 19 10*3/uL — ABNORMAL HIGH (ref 4.0–10.5)
nRBC: 0 % (ref 0.0–0.2)

## 2019-06-25 LAB — URINALYSIS, COMPLETE (UACMP) WITH MICROSCOPIC
Bacteria, UA: NONE SEEN
Bilirubin Urine: NEGATIVE
Glucose, UA: 50 mg/dL — AB
Hgb urine dipstick: NEGATIVE
Ketones, ur: NEGATIVE mg/dL
Leukocytes,Ua: NEGATIVE
Nitrite: NEGATIVE
Protein, ur: 30 mg/dL — AB
Specific Gravity, Urine: 1.024 (ref 1.005–1.030)
pH: 5 (ref 5.0–8.0)

## 2019-06-25 LAB — GLUCOSE, CAPILLARY
Glucose-Capillary: 19 mg/dL — CL (ref 70–99)
Glucose-Capillary: 34 mg/dL — CL (ref 70–99)
Glucose-Capillary: 18 mg/dL — CL (ref 70–99)
Glucose-Capillary: 37 mg/dL — CL (ref 70–99)
Glucose-Capillary: 191 mg/dL — ABNORMAL HIGH (ref 70–99)
Glucose-Capillary: 145 mg/dL — ABNORMAL HIGH (ref 70–99)
Glucose-Capillary: 77 mg/dL (ref 70–99)
Glucose-Capillary: 65 mg/dL — ABNORMAL LOW (ref 70–99)
Glucose-Capillary: 30 mg/dL — CL (ref 70–99)
Glucose-Capillary: 144 mg/dL — ABNORMAL HIGH (ref 70–99)
Glucose-Capillary: 222 mg/dL — ABNORMAL HIGH (ref 70–99)

## 2019-06-25 LAB — LACTIC ACID, PLASMA: Lactic Acid, Venous: 3.7 mmol/L (ref 0.5–1.9)

## 2019-06-25 LAB — PROCALCITONIN: Procalcitonin: 0.36 ng/mL

## 2019-06-25 MED ORDER — DEXTROSE 50 % IV SOLN
1.0000 | Freq: Once | INTRAVENOUS | Status: AC
  Administered 2019-06-25: 50 mL via INTRAVENOUS

## 2019-06-25 MED ORDER — DEXTROSE 50 % IV SOLN
25.0000 mL | Freq: Once | INTRAVENOUS | Status: AC
  Administered 2019-06-25: 25 mL via INTRAVENOUS
  Filled 2019-06-25: qty 50

## 2019-06-25 MED ORDER — SODIUM CHLORIDE 0.9 % IV SOLN
2.0000 g | INTRAVENOUS | Status: DC
  Filled 2019-06-25: qty 20

## 2019-06-25 MED ORDER — SODIUM CHLORIDE 0.9 % IV SOLN
500.0000 mg | Freq: Once | INTRAVENOUS | Status: AC
  Administered 2019-06-25: 500 mg via INTRAVENOUS
  Filled 2019-06-25: qty 500

## 2019-06-25 MED ORDER — SODIUM CHLORIDE 0.9 % IV SOLN
500.0000 mg | INTRAVENOUS | Status: DC
  Administered 2019-06-26 – 2019-06-27 (×2): 500 mg via INTRAVENOUS
  Filled 2019-06-25 (×4): qty 500

## 2019-06-25 MED ORDER — SODIUM CHLORIDE 0.9 % IV SOLN
2.0000 g | Freq: Once | INTRAVENOUS | Status: AC
  Administered 2019-06-25: 2 g via INTRAVENOUS
  Filled 2019-06-25: qty 2

## 2019-06-25 MED ORDER — DEXTROSE 50 % IV SOLN
INTRAVENOUS | Status: AC
  Administered 2019-06-25: 25 mL via INTRAVENOUS
  Filled 2019-06-25: qty 50

## 2019-06-25 MED ORDER — CEFDINIR 300 MG PO CAPS: 300.0000 mg | ORAL_CAPSULE | Freq: Two times a day (BID) | ORAL | 0 refills | Status: DC

## 2019-06-25 MED ORDER — VANCOMYCIN HCL IN DEXTROSE 1-5 GM/200ML-% IV SOLN
1000.0000 mg | Freq: Once | INTRAVENOUS | Status: AC
  Administered 2019-06-25: 1000 mg via INTRAVENOUS
  Filled 2019-06-25: qty 200

## 2019-06-25 MED ORDER — AZITHROMYCIN 250 MG PO TABS: ORAL_TABLET | ORAL | 0 refills | Status: DC

## 2019-06-25 NOTE — H&P (Signed)
Corey Carr:[id number] DOB: 09-16-1940 DOA: 06/25/2019     PCP: [name], MD   Outpatient Specialists:     Oncology   Dr. Grayland Ormond   Patient arrived to ER on 06/25/19 at 1221  Patient coming from: home Lives  With family    Chief Complaint:   Chief Complaint  Patient presents with  . Hypoglycemia    HPI: Corey Carr is a 79 y.o. male with medical history significant of lung mass x1 month with ongoing unintentional weight loss, suspected lung cancer, COPD and hypertension, with history of COVID-19 pneumonia in12/17/20,    Presented with hypoglycemia patient was getting a PET scan today and nurse noted that his blood sugar was down to 19 he was given cranberry juice blood sugar improved to 37 he reports feeling somewhat weak but he is somewhat confused no history of diabetes not on insulin or any medications no associated fevers or chills.    hospitalized in January 2021 with hypercalcemia, dehydration with acute kidney injury, who was sent by his PCP for hypercalcemia of 11.7 after presenting with persistent weakness with poor oral intake and shortness of breath. Calcium improved after ZAadministration and fluids he was able to be discharged on 28 January to home Outpatient biopsy was arranged He was seen by Dr. Henderson Cloud oncology Patient has chronic hypotension baseline blood pressure between 90s to low 100  Regarding history of Covid patient was hospitalized in December and completed Remdesivir   Infectious risk factors:  Reports fatigue    KNOWN COVID POSITIVE now in remission With documented negative tests    Lab Results  Component Value Date   SARSCOV2NAA NEGATIVE 06/17/2019   SARSCOV2NAA POSITIVE (A) 05/08/2019   [place] NEGATIVE 04/30/2019     Regarding pertinent Chronic problems:    COPD - not  on baseline oxygen      Known history of lung cancer -  While in ER: Initially hypothermic 94.6 Persistently hypoglycemic Chest x-ray  unable to rule out new infiltrate Started on broad-spectrum antibiotics Was given on 2 occasions and amp of D50 With recurrent hypoglycemia  The following Work up has been ordered so far:  Orders Placed This Encounter  Procedures  . SARS CORONAVIRUS 2 (TAT 6-24 HRS) Nasopharyngeal Nasopharyngeal Swab  . Blood culture (routine x 2)  . Culture, sputum-assessment  . DG Chest Port 1 View  . Glucose, capillary  . CBC  . Comprehensive metabolic panel  . Glucose, capillary  . Urinalysis, Complete w Microscopic  . Glucose, capillary  . Glucose, capillary  . Glucose, capillary  . Glucose, capillary  . Procalcitonin  . Glucose, capillary  . Lactic acid, plasma  . Lactic acid, plasma  . Prealbumin  . Cardiac monitoring  . Encourage fluids  . Offer Fluids  . Catherize if unable to void  . In and Out Cath  . Check temperature  . Offer food (encourage patient to eat)  . Cardiac monitoring  . Give 8 oz juice or regular soda  . Refer to Sidebar Report: Sepsis Bundle ED/IP  . If lactate (lactic acid) >2, verify repeat lactic acid order has been placed to be drawn  . Document vital signs within 1-hour of fluid bolus completion and notify provider of bolus completion  . Vital signs  . Cardiac Monitoring Continuous x 12 hours Indications for use: Other; other indications for use: sirs  . Full code  . Consult to hospitalist  ALL PATIENTS BEING ADMITTED/HAVING PROCEDURES NEED COVID-19 SCREENING  .  Pharmacy Consult  . Nutritional services consult  . POC CBG, ED  . Insert peripheral IV  . Place in observation (patient's expected length of stay will be less than 2 midnights)     Following Medications were ordered in ER: Medications  vancomycin (VANCOCIN) IVPB 1000 mg/200 mL premix (has no administration in time range)  azithromycin (ZITHROMAX) 500 mg in sodium chloride 0.9 % 250 mL IVPB (has no administration in time range)  cefTRIAXone (ROCEPHIN) 2 g in sodium chloride 0.9 % 100 mL IVPB  (has no administration in time range)  azithromycin (ZITHROMAX) 500 mg in sodium chloride 0.9 % 250 mL IVPB (has no administration in time range)  dextrose 50 % solution 25 mL (25 mLs Intravenous Given 06/25/19 1240)  ceFEPIme (MAXIPIME) 2 g in sodium chloride 0.9 % 100 mL IVPB (0 g Intravenous Stopped 06/25/19 2204)  dextrose 50 % solution 50 mL (50 mLs Intravenous Given 06/25/19 2016)        Consult Orders  (From admission, onward)         Start     Ordered   06/25/19 2141  Nutritional services consult  Once    Provider:  (Not yet assigned)  Question:  Reason for Consult?  Answer:  malnutrition   06/25/19 2141   06/25/19 2042  Consult to hospitalist  ALL PATIENTS BEING ADMITTED/HAVING PROCEDURES NEED COVID-19 SCREENING  Once    Comments: ALL PATIENTS BEING ADMITTED/HAVING PROCEDURES NEED COVID-19 SCREENING  Provider:  (Not yet assigned)  Question Answer Comment  Place call to: [phone]   Reason for Consult Admit      06/25/19 2041           Significant initial  Findings: Abnormal Labs Reviewed  GLUCOSE, CAPILLARY - Abnormal; Notable for the following components:      Result Value   Glucose-Capillary 37 (*)    All other components within normal limits  CBC - Abnormal; Notable for the following components:   WBC 19.0 (*)    RBC 3.74 (*)    Hemoglobin 10.1 (*)    HCT 30.0 (*)    RDW 18.3 (*)    Platelets 719 (*)    All other components within normal limits  COMPREHENSIVE METABOLIC PANEL - Abnormal; Notable for the following components:   Glucose, Bld 195 (*)    BUN 31 (*)    Calcium 8.6 (*)    Albumin 3.0 (*)    All other components within normal limits  GLUCOSE, CAPILLARY - Abnormal; Notable for the following components:   Glucose-Capillary 191 (*)    All other components within normal limits  URINALYSIS, COMPLETE (UACMP) WITH MICROSCOPIC - Abnormal; Notable for the following components:   Color, Urine AMBER (*)    APPearance HAZY (*)    Glucose, UA 50 (*)     Protein, ur 30 (*)    All other components within normal limits  GLUCOSE, CAPILLARY - Abnormal; Notable for the following components:   Glucose-Capillary 145 (*)    All other components within normal limits  GLUCOSE, CAPILLARY - Abnormal; Notable for the following components:   Glucose-Capillary 65 (*)    All other components within normal limits  GLUCOSE, CAPILLARY - Abnormal; Notable for the following components:   Glucose-Capillary 30 (*)    All other components within normal limits  GLUCOSE, CAPILLARY - Abnormal; Notable for the following components:   Glucose-Capillary 144 (*)    All other components within normal limits  LACTIC ACID, PLASMA - Abnormal; Notable  for the following components:   Lactic Acid, Venous 3.7 (*)    All other components within normal limits     Otherwise labs showing:    Recent Labs  Lab 06/19/19 0833 06/24/19 1030 06/25/19 1245  NA 135 137 136  K 3.8 3.9 4.1  CO2 23 20* 22  GLUCOSE 106* 97 195*  BUN 25* 21 31*  CREATININE 0.90 0.98 1.12  CALCIUM 10.4* 9.1 8.6*    Cr   Up from baseline see below Lab Results  Component Value Date   CREATININE 1.12 06/25/2019   CREATININE 0.98 06/24/2019   CREATININE 0.90 06/19/2019    Recent Labs  Lab 06/24/19 1030 06/25/19 1245  AST 18 19  ALT 17 17  ALKPHOS 85 82  BILITOT 0.8 0.7  PROT 6.9 6.9  ALBUMIN 3.1* 3.0*   Lab Results  Component Value Date   CALCIUM 8.6 (L) 06/25/2019   PHOS 3.6 06/17/2019    WBC      Component Value Date/Time   WBC 19.0 (H) 06/25/2019 1245   ANC    Component Value Date/Time   NEUTROABS 12.1 (H) 06/24/2019 1030   ALC No components found for: LYMPHAB   Plt: Lab Results  Component Value Date   PLT 719 (H) 06/25/2019     Lactic Acid, Venous    Component Value Date/Time   LATICACIDVEN 3.7 (HH) 06/25/2019 2058    Procalcitonin 0.31   COVID-19 Labs  No results for input(s): DDIMER, FERRITIN, LDH, CRP in the last 72 hours.  Lab Results  Component  Value Date   SARSCOV2NAA NEGATIVE 06/17/2019   SARSCOV2NAA POSITIVE (A) 05/08/2019   [place] NEGATIVE 04/30/2019      HG/HCT   stable,      Component Value Date/Time   HGB 10.1 (L) 06/25/2019 1245   HCT 30.0 (L) 06/25/2019 1245      ECG: not Ordered      CBG (last 3)  Recent Labs    06/25/19 1851 06/25/19 2005 06/25/19 2048  GLUCAP 65* 30* 144*       UA  no evidence of UTI    Urine analysis:    Component Value Date/Time   COLORURINE AMBER (A) 06/25/2019 1704   APPEARANCEUR HAZY (A) 06/25/2019 1704   APPEARANCEUR Clear 11/14/2016 1538   LABSPEC 1.024 06/25/2019 1704   PHURINE 5.0 06/25/2019 1704   GLUCOSEU 50 (A) 06/25/2019 1704   HGBUR NEGATIVE 06/25/2019 1704   BILIRUBINUR NEGATIVE 06/25/2019 1704   BILIRUBINUR Negative 11/14/2016 1538   KETONESUR NEGATIVE 06/25/2019 1704   PROTEINUR 30 (A) 06/25/2019 1704   NITRITE NEGATIVE 06/25/2019 1704   LEUKOCYTESUR NEGATIVE 06/25/2019 1704   Ordered   CXR - possible new infiltrate     ED Triage Vitals  Enc Vitals Group     BP 06/25/19 1236 126/75     Pulse Rate 06/25/19 1236 (!) 102     Resp 06/25/19 1236 14     Temp 06/25/19 1236 (!) 94.6 F (34.8 C)     Temp Source 06/25/19 1236 Oral     SpO2 06/25/19 1236 100 %     Weight 06/25/19 1248 95 lb (43.1 kg)     Height 06/25/19 1248 5\' 8"  (1.727 m)     Head Circumference --      Peak Flow --      Pain Score --      Pain Loc --      Pain Edu? --      Excl. in  GC? --   DQQI(29)@       Latest  Blood pressure 138/83, pulse 97, temperature 97.6 F (36.4 C), temperature source Oral, resp. rate 20, height 5\' 8"  (1.727 m), weight 43.1 kg, SpO2 100 %.    Hospitalist was called for admission for hypoglycemia    Review of Systems:    Pertinent positives include: fatigue, dyspnea on exertion,  Constitutional:  No weight loss, night sweats, Fevers, chills,  weight loss  HEENT:  No headaches, Difficulty swallowing,Tooth/dental problems,Sore throat,  No  sneezing, itching, ear ache, nasal congestion, post nasal drip,  Cardio-vascular:  No chest pain, Orthopnea, PND, anasarca, dizziness, palpitations.no Bilateral lower extremity swelling  GI:  No heartburn, indigestion, abdominal pain, nausea, vomiting, diarrhea, change in bowel habits, loss of appetite, melena, blood in stool, hematemesis Resp:  no shortness of breath at rest. No No excess mucus, no productive cough, No non-productive cough, No coughing up of blood.No change in color of mucus.No wheezing. Skin:  no rash or lesions. No jaundice GU:  no dysuria, change in color of urine, no urgency or frequency. No straining to urinate.  No flank pain.  Musculoskeletal:  No joint pain or no joint swelling. No decreased range of motion. No back pain.  Psych:  No change in mood or affect. No depression or anxiety. No memory loss.  Neuro: no localizing neurological complaints, no tingling, no weakness, no double vision, no gait abnormality, no slurred speech, no confusion  All systems reviewed and apart from [place] all are negative  Past Medical History:   Past Medical History:  Diagnosis Date  . Hypertension       Past Surgical History:  Procedure Laterality Date  . KNEE SURGERY Right 1957    Social History:  Ambulatory  independently       reports that he quit smoking about 6 years ago. His smoking use included cigarettes. He has a 30.00 pack-year smoking history. He has never used smokeless tobacco. He reports that he does not drink alcohol or use drugs.   Family History:   Family History  Problem Relation Age of Onset  . Emphysema Father   . Lung cancer Maternal Uncle     Allergies: No Known Allergies   Prior to Admission medications   Medication Sig Start Date End Date Taking? Authorizing Provider  albuterol (VENTOLIN HFA) 108 (90 Base) MCG/ACT inhaler Inhale 2 puffs into the lungs See admin instructions. Inhale 2 puffs into the lungs every 3 hours as needed for  wheezing. 06/16/19 06/15/20 Yes [provider]  aspirin 325 MG tablet Take 1 tablet by mouth daily.   Yes [provider]  dexamethasone (DECADRON) 4 MG tablet Take 1 tablet (4 mg total) by mouth daily. 06/24/19  Yes Lloyd Huger, MD  esomeprazole (NEXIUM) 20 MG capsule Take 20 mg by mouth daily at 12 noon.   Yes [provider]  feeding supplement, ENSURE ENLIVE, (ENSURE ENLIVE) LIQD Take 237 mLs by mouth 3 (three) times daily between meals. 05/12/19  Yes Nicole Kindred A, DO  fluticasone (FLONASE) 50 MCG/ACT nasal spray Place 2 sprays into both nostrils daily as needed for allergies or rhinitis.   Yes [provider]  Fluticasone-Umeclidin-Vilant (TRELEGY ELLIPTA) 100-62.5-25 MCG/INH AEPB Inhale 1 puff into the lungs daily.   Yes [provider]  HYDROcodone-acetaminophen (NORCO/VICODIN) 5-325 MG tablet Take 1 tablet by mouth every 6 (six) hours as needed. 06/24/19  Yes Lloyd Huger, MD  loratadine (ALLERGY RELIEF) 10 MG tablet  Take 1 tablet (10 mg total) by mouth daily. 05/13/19  Yes Ezekiel Slocumb, DO  megestrol (MEGACE) 40 MG tablet Take 40 mg by mouth daily. 05/02/19  Yes [provider]  meloxicam (MOBIC) 15 MG tablet Take 15 mg by mouth daily as needed for pain. 06/17/19  Yes [provider]  omega-3 acid ethyl esters (LOVAZA) 1 g capsule Take 1 g by mouth daily.   Yes [provider]  vitamin B-12 (CYANOCOBALAMIN) 500 MCG tablet Take 500 mcg by mouth daily.   Yes [provider]  vitamin C (ASCORBIC ACID) 250 MG tablet Take 500 mg by mouth daily.    Yes [provider]  vitamin E (VITAMIN E) 400 UNIT capsule Take 400 Units by mouth daily.   Yes [provider]  azithromycin (ZITHROMAX Z-PAK) 250 MG tablet Take 2 tablets (500 mg) on  Day 1,  followed by 1 tablet (250 mg) once daily on Days 2 through 5. 06/25/19 06/30/19  Lavonia Drafts, MD  cefdinir (OMNICEF) 300 MG capsule Take 1  capsule (300 mg total) by mouth 2 (two) times daily. 06/25/19   Lavonia Drafts, MD  [facility] GLYCERIN ADULT 2 g suppository SMARTSIG:1 SUPPOS Rectally As Needed 06/09/19   [provider]  polyethylene glycol powder (GLYCOLAX/MIRALAX) 17 GM/SCOOP powder Take 17 g by mouth 2 (two) times daily. Mix in 4-8 ounces of fluid prior to taking. 06/09/19   [provider]  senna-docusate (SENOKOT-S) 8.6-50 MG tablet Take 1 tablet by mouth at bedtime as needed for mild constipation. 06/19/19 07/19/19  Sidney Ace, MD   Physical Exam: Blood pressure 138/83, pulse 97, temperature 97.6 F (36.4 C), temperature source Oral, resp. rate 20, height 5\' 8"  (1.727 m), weight 43.1 kg, SpO2 100 %. 1. General:  in No  Acute distress  -appearing 2. Psychological: Alert and   Oriented 3. Head/ENT:    Dry Mucous Membranes                          Head Non traumatic, neck supple                            Poor Dentition 4. SKIN: decreased Skin turgor,  Skin clean Dry and intact no rash 5. Heart: Regular rate and rhythm no  Murmur, no Rub or gallop 6. Lungs:  Decreased on the left, no wheezes or crackles   7. Abdomen: Soft, cacectic  non-tender, Non distended bowel sounds present 8. Lower extremities: no clubbing, cyanosis, no  edema 9. Neurologically Grossly intact, moving all 4 extremities equally   10. MSK: Normal range of motion   All other LABS:     Recent Labs  Lab 06/24/19 1030 06/25/19 1245  WBC 15.8* 19.0*  NEUTROABS 12.1*  --   HGB 10.3* 10.1*  HCT 31.9* 30.0*  MCV 82.0 80.2  PLT 643* 719*     Recent Labs  Lab 06/19/19 0833 06/24/19 1030 06/25/19 1245  NA 135 137 136  K 3.8 3.9 4.1  CL 104 104 103  CO2 23 20* 22  GLUCOSE 106* 97 195*  BUN 25* 21 31*  CREATININE 0.90 0.98 1.12  CALCIUM 10.4* 9.1 8.6*     Recent Labs  Lab 06/24/19 1030 06/25/19 1245  AST 18 19  ALT 17 17  ALKPHOS 85 82  BILITOT 0.8 0.7  PROT 6.9 6.9  ALBUMIN 3.1* 3.0*  Cultures:      Component Value Date/Time   SDES BLOOD LAC 05/09/2019 0759   SPECREQUEST  05/09/2019 0759    BOTTLES DRAWN AEROBIC AND ANAEROBIC Blood Culture adequate volume   CULT  05/09/2019 0759    NO GROWTH 5 DAYS Performed at [facility], [address]el[address] Bhat [place], [place] [address]    REPTSTATUS 05/14/2019 FINAL 05/09/2019 0759     Radiological Exams on Admission: DG Chest Port 1 View  Result Date: 06/25/2019 CLINICAL DATA:  Weakness, COVID-19 positive EXAM: PORTABLE CHEST 1 VIEW COMPARISON:  06/17/2019 FINDINGS: Chronic volume loss with consolidation in the left hemithorax, stable from prior. There may be mildly increased opacity within the aerated portion of the left lung. Heart size is stable. Mediastinal structures remain shifted to the left. There is hyperexpansion of the right lung field with chronic emphysematous changes and right apical scarring. No pneumothorax. IMPRESSION: Chronic volume loss with consolidation in the left hemithorax, stable from prior. There may be mildly increased opacity within the aerated portion of the left lung, which could reflect pneumonia given the patient's history. Electronically Signed   By: Davina Poke D.O.   On: 06/25/2019 13:31    Chart has been reviewed    Assessment/Plan  79 y.o. male with medical history significant of lung mass x1 month with ongoing unintentional weight loss, suspected lung cancer, COPD and hypertension, with history of COVID-19 pneumonia in12/17/20,     Admitted for persistent hypoglycemia possibly postobstructive pneumonia  Present on Admission:   . Hypoglycemia without diagnosis of diabetes mellitus -in the setting of severe malnutrition Failure to thrive and cachexia in the setting of malignancy with apparently a large tumor burden.  Marland Kitchen SIRS (systemic inflammatory response syndrome) (HCC) patient initially tachycardic hypothermic with elevated white blood cell count. Initiated broad-spectrum antibiotics in the  emergency department blood cultures obtained Most likely source being pulmonary. Check lactic acid and procalcitonin Rehydrate and full  . Lung mass  previously known-notify oncology patient has been admitted he did not tolerate PET scan today secondary to hypoglycemia.  This could be reattempted as an outpatient Defer to oncology if biopsy could be facilitated while admitted. during this stay . Postobstructive pneumonia -question pneumonia in the setting of known history of lung mass.  Antibiotics initiated in the emergency department.  Will obtain lactic acid and procalcitonin white blood cell count elevated in the setting of recent steroids with good villous infectious etiology patient has chronic white blood cell count elevation felt to be in the past secondary to being reactive to malignancy. Marland Kitchen COPD with chronic bronchitis (HCC) -chronic stable continue home medications . Leukocytosis -recently just received Decadron questionable infiltrate on chest x-ray and initial hypothermia could not rule out infectious etiology . Failure to thrive (0-17)/ . Cancer cachexia (HCC)/ . Protein-calorie malnutrition, severe-check prealbumin order nutritional consult Other plan as per orders.  . Essential hypertension hx of soft BP currently not on any meds DVT prophylaxis:   scd   Code Status:  FULL CODE  as per patient during prior admission patient was DNR but currently after discussion he actually stated that he wants to be full code I had personally discussed CODE STATUS with patient    Family Communication:   Family not at  Bedside   Disposition Plan:   To home once workup is complete and patient is stable                      Would benefit  from PT/OT eval prior to DC  Ordered                                       Consults called:    Notified oncology pt was readmitted  Admission status:  ED Disposition    ED Disposition Condition [place]: [place] [[id number]]  Level of Care: Telemetry [5]  Covid Evaluation: Asymptomatic Screening Protocol (No Symptoms)  Diagnosis: SIRS (systemic inflammatory response syndrome) ([place]) [[id number]]  Admitting Physician: Toy Baker [[id number]]  Attending Physician: Toy Baker [[id number]]       Obs    Level of care    tele  For 12H 24H     medical floor       SDU tele indefinitely please discontinue once patient no longer qualifies   Precautions: admitted as history of Covid currently recovered No active isolations    PPE: Used by the provider:   P100  eye Goggles,  Gloves      Lyan Moyano 06/25/2019, 10:12 PM    [facility]     after 2 AM please page floor coverage PA If 7AM-7PM, please contact the day team taking care of the patient using [email or web address]   Patient was evaluated in the context of the global COVID-19 pandemic, which necessitated consideration that the patient might be at risk for infection with the SARS-CoV-2 virus that causes COVID-19. Institutional protocols and algorithms that pertain to the evaluation of patients at risk for COVID-19 are in a state of rapid change based on information released by regulatory bodies including the CDC and federal and state organizations. These policies and algorithms were followed during the patient's care.

## 2019-06-25 NOTE — ED Notes (Signed)
Answered pts call bell. Pt requesting assistance to urinate. Assisted pt to edge of bed but pt unsteady on feet and had to sit back down. Unable to urinate at this time.

## 2019-06-25 NOTE — ED Notes (Signed)
This RN to bedside due to patient calling out, patient also repeatedly screaming "Help, help". This RN assisted patient with urinal at this time. Pt noted to be looking at this RN and hitting call bell while this RN standing at bedside. Pt unable to urinate in urinal at this time.

## 2019-06-25 NOTE — ED Triage Notes (Signed)
Pt via POV from [facility] PET Scan, pt had a CBG of 19 per staff. Pt was given cranberry juice prior to arrival. Repeat CBG was 37 and placed in room. Pt is A&Ox4 at this time. Dr. Corky Downs made aware. See orders.

## 2019-06-25 NOTE — Progress Notes (Signed)
CODE SEPSIS - PHARMACY COMMUNICATION  **Broad Spectrum Antibiotics should be administered within 1 hour of Sepsis diagnosis**  Time Code Sepsis Called/Page Received: 2137  Antibiotics Ordered: Cefepime, Vancomycin, Rocephin, Zithromax  Time of 1st antibiotic administration: 2119, Cefepime  Additional action taken by pharmacy: n/a  If necessary, Name of Provider/Nurse Contacted: n/a   Ena Dawley ,PharmD Clinical Pharmacist  06/25/2019  9:59 PM

## 2019-06-25 NOTE — ED Notes (Signed)
Pt given 2 cups of water and encouraged the pt to drink.

## 2019-06-25 NOTE — Progress Notes (Signed)
PHARMACY -  BRIEF ANTIBIOTIC NOTE   Pharmacy has received consult(s) for vanc and cefepime from an ED provider.  The patient's profile has been reviewed for ht/wt/allergies/indication/available labs.    One time order(s) placed for vanc 1 g + cefepime 2 g  Further antibiotics/pharmacy consults should be ordered by admitting physician if indicated.                       Thank you,  Tawnya Crook, PharmD 06/25/2019  8:11 PM

## 2019-06-25 NOTE — ED Notes (Signed)
Pt has finished as much as he can of meal tray and has drank most of juice provided. Pt remains alert and oriented. Resting in bed at this time in NAD.

## 2019-06-25 NOTE — ED Provider Notes (Signed)
Patient observed in the ER.  Found to have recurrent hypoglycemia despite tolerating p.o.  Will give additional amp.  Noted to be hypothermic. I am concerned that he has occult sepsis will order blood cultures and give antibiotics.  Patient will require admission to the hospital.   Merlyn Lot, MD 06/25/19 2315

## 2019-06-25 NOTE — ED Triage Notes (Signed)
First Nurse Note:  Pt sent over from [place]; per RN report, patient with CBG 19, was given Cranberry juice prior to arrival here.  Pt alert, communicative upon arrival.  Most recent CBG 37.  Pt transported to room at this time.  RN, Caryl Pina made aware of new patient.    22G PIV to left AC in place upon patient arrival.

## 2019-06-25 NOTE — ED Provider Notes (Signed)
[facility] Emergency Department Provider Note   ____________________________________________    I have reviewed the triage vital signs and the nursing notes.   HISTORY  Chief Complaint Hypoglycemia     HPI Corey Carr is a 79 y.o. male with a history of hypertension, currently being evaluated for lung mass by oncology.  Sent in for hypoglycemia.  Patient reports he has been feeling well but he did not eat anything last night nor this morning because he had an appointment.  He thinks that is why his sugar got low.  He felt somewhat weak but currently feels well after drinking some cranberry juice.  He is not diabetic.  He is not taking any insulin.  No new medications.  Denies fevers or chills.  No abdominal pain.  Past Medical History:  Diagnosis Date  . Hypertension     Patient Active Problem List   Diagnosis Date Noted  . Acute metabolic encephalopathy 62/83/1517  . Protein-calorie malnutrition, severe 06/18/2019  . Hypercalcemia of malignancy 06/17/2019  . COPD with chronic bronchitis ([place]) 06/17/2019  . History of 2019 novel coronavirus disease (COVID-19) 06/17/2019  . Cancer cachexia ([place]) 06/17/2019  . Acute kidney injury ([place]) 05/24/2019  . Hyperkalemia 05/23/2019  . COVID-19 virus detected 05/10/2019  . AKI (acute kidney injury) ([place]) 05/09/2019  . Lung mass 05/09/2019  . Essential hypertension 05/09/2019  . Asthma 05/09/2019  . Vitamin B12 deficiency 05/09/2019  . Dehydration 05/09/2019  . Seasonal allergies 05/09/2019  . Failure to thrive (0-17) 05/08/2019    Past Surgical History:  Procedure Laterality Date  . KNEE SURGERY Right 1957    Prior to Admission medications   Medication Sig Start Date End Date Taking? Authorizing Provider  albuterol (VENTOLIN HFA) 108 (90 Base) MCG/ACT inhaler Inhale 2 puffs into the lungs See admin instructions. Inhale 2 puffs into the lungs every 3 hours as needed for wheezing. 06/16/19  06/15/20  [provider]  aspirin 325 MG tablet Take 1 tablet by mouth daily.    [provider]  azithromycin (ZITHROMAX Z-PAK) 250 MG tablet Take 2 tablets (500 mg) on  Day 1,  followed by 1 tablet (250 mg) once daily on Days 2 through 5. 06/25/19 06/30/19  Lavonia Drafts, MD  cefdinir (OMNICEF) 300 MG capsule Take 1 capsule (300 mg total) by mouth 2 (two) times daily. 06/25/19   Lavonia Drafts, MD  [facility] GLYCERIN ADULT 2 g suppository SMARTSIG:1 SUPPOS Rectally As Needed 06/09/19   [provider]  dexamethasone (DECADRON) 4 MG tablet Take 1 tablet (4 mg total) by mouth daily. 06/24/19   Lloyd Huger, MD  esomeprazole (NEXIUM) 20 MG capsule Take 20 mg by mouth daily at 12 noon.    [provider]  feeding supplement, ENSURE ENLIVE, (ENSURE ENLIVE) LIQD Take 237 mLs by mouth 3 (three) times daily between meals. 05/12/19   Ezekiel Slocumb, DO  fluticasone (FLONASE) 50 MCG/ACT nasal spray Place 2 sprays into both nostrils daily as needed for allergies or rhinitis.    [provider]  Fluticasone-Umeclidin-Vilant (TRELEGY ELLIPTA) 100-62.5-25 MCG/INH AEPB Inhale 1 puff into the lungs daily.    [provider]  HYDROcodone-acetaminophen (NORCO/VICODIN) 5-325 MG tablet Take 1 tablet by mouth every 6 (six) hours as needed. 06/24/19   Lloyd Huger, MD  loratadine (ALLERGY RELIEF) 10 MG tablet Take 1 tablet (10 mg total) by mouth daily. 05/13/19   Ezekiel Slocumb, DO  megestrol (MEGACE) 40 MG tablet Take  40 mg by mouth daily. 05/02/19   [provider]  meloxicam (MOBIC) 15 MG tablet Take 15 mg by mouth daily as needed for pain. 06/17/19   [provider]  omega-3 acid ethyl esters (LOVAZA) 1 g capsule Take 1 g by mouth daily.    [provider]  polyethylene glycol powder (GLYCOLAX/MIRALAX) 17 GM/SCOOP powder Take 17 g by mouth 2 (two) times daily. Mix in 4-8 ounces of fluid prior to taking. 06/09/19   [provider]  senna-docusate (SENOKOT-S) 8.6-50 MG tablet Take 1 tablet by mouth at bedtime as needed for mild constipation. 06/19/19 07/19/19  Sidney Ace, MD  vitamin B-12 (CYANOCOBALAMIN) 500 MCG tablet Take 500 mcg by mouth daily.    [provider]  vitamin C (ASCORBIC ACID) 250 MG tablet Take 500 mg by mouth daily.     [provider]  vitamin E (VITAMIN E) 400 UNIT capsule Take 400 Units by mouth daily.    [provider]     Allergies Patient has no known allergies.  Family History  Problem Relation Age of Onset  . Emphysema Father   . Lung cancer Maternal Uncle     Social History Social History   Tobacco Use  . Smoking status: Former Smoker    Packs/day: 0.50    Years: 60.00    Pack years: 30.00    Types: Cigarettes    Quit date: 2015    Years since quitting: 6.0  . Smokeless tobacco: Never Used  Substance Use Topics  . Alcohol use: No  . Drug use: No    Review of Systems  Constitutional: No fever/chills Eyes: No visual changes.  ENT: No sore throat. Cardiovascular: Denies chest pain. Respiratory: Denies shortness of breath. Gastrointestinal: No abdominal pain.   Genitourinary: Negative for dysuria. Musculoskeletal: Negative for back pain. Skin: Negative for rash. Neurological: Negative for headaches   ____________________________________________   PHYSICAL EXAM:  VITAL SIGNS: ED Triage Vitals  Enc Vitals Group     BP 06/25/19 1236 126/75     Pulse Rate 06/25/19 1236 (!) 102     Resp 06/25/19 1236 14     Temp 06/25/19 1236 (!) 94.6 F (34.8 C)     Temp Source 06/25/19 1236 Oral     SpO2 06/25/19 1236 100 %     Weight 06/25/19 1248 43.1 kg (95 lb)     Height 06/25/19 1248 1.727 m (5\' 8" )     Head Circumference --      Peak Flow --      Pain Score --      Pain Loc --      Pain Edu? --      Excl. in [place]? --     Constitutional: Alert and oriented.   Nose: No congestion/rhinnorhea. Mouth/Throat: Mucous membranes are  moist.    Cardiovascular: Normal rate, regular rhythm. Grossly normal heart sounds.  Good peripheral circulation. Respiratory: Normal respiratory effort.  No retractions. Lungs CTAB. Gastrointestinal: Soft and nontender. No distention.  No CVA tenderness.  Musculoskeletal: No lower extremity tenderness nor edema.  Warm and well perfused Neurologic:  Normal speech and language. No gross focal neurologic deficits are appreciated.  Skin:  Skin is warm, dry and intact. No rash noted. Psychiatric: Mood and affect are normal. Speech and behavior are normal.  ____________________________________________   LABS (all labs ordered are listed, but only abnormal results are displayed)  Labs Reviewed  GLUCOSE, CAPILLARY - Abnormal; Notable for the following components:  Result Value   Glucose-Capillary 37 (*)    All other components within normal limits  CBC - Abnormal; Notable for the following components:   WBC 19.0 (*)    RBC 3.74 (*)    Hemoglobin 10.1 (*)    HCT 30.0 (*)    RDW 18.3 (*)    Platelets 719 (*)    All other components within normal limits  COMPREHENSIVE METABOLIC PANEL - Abnormal; Notable for the following components:   Glucose, Bld 195 (*)    BUN 31 (*)    Calcium 8.6 (*)    Albumin 3.0 (*)    All other components within normal limits  GLUCOSE, CAPILLARY - Abnormal; Notable for the following components:   Glucose-Capillary 191 (*)    All other components within normal limits  GLUCOSE, CAPILLARY - Abnormal; Notable for the following components:   Glucose-Capillary 145 (*)    All other components within normal limits  URINALYSIS, COMPLETE (UACMP) WITH MICROSCOPIC  CBG MONITORING, ED   ____________________________________________  EKG   None__________________________________  RADIOLOGY  Increased opacity left lung ____________________________________________   PROCEDURES  Procedure(s) performed: No  Procedures   Critical Care performed:  yes  CRITICAL CARE Performed by: Lavonia Drafts   Total critical care time: 30 minutes  Critical care time was exclusive of separately billable procedures and treating other patients.  Critical care was necessary to treat or prevent imminent or life-threatening deterioration.  Critical care was time spent personally by me on the following activities: development of treatment plan with patient and/or surrogate as well as nursing, discussions with consultants, evaluation of patient's response to treatment, examination of patient, obtaining history from patient or surrogate, ordering and performing treatments and interventions, ordering and review of laboratory studies, ordering and review of radiographic studies, pulse oximetry and re-evaluation of patient's condition.  ____________________________________________   INITIAL IMPRESSION / ASSESSMENT AND PLAN / ED COURSE  Pertinent labs & imaging results that were available during my care of the patient were reviewed by me and considered in my medical decision making (see chart for details).  Patient presents with hyperglycemia, initial glucose of 37, prior to that had a glucose of 19 was given cranberry juice.  Was alert and oriented the entire time.  We gave half an amp of D50 with good response.  The patient continues to feel well and has no complaints.  He is mildly hypothermic likely related to hypoglycemia.  Chest x-ray appears unchanged, possible increased infiltrate.  Chronically elevated white blood cell count, may warrant antibiotics if early pneumonia.  We will observe in the emergency department, if glucose is stable appropriate for discharge with outpatient antibiotics, close follow-up with cancer center    ____________________________________________   FINAL CLINICAL IMPRESSION(S) / ED DIAGNOSES  Final diagnoses:  Hypoglycemia  Community acquired pneumonia of left upper lobe of lung        Note:  This document was  prepared using Dragon voice recognition software and may include unintentional dictation errors.   Lavonia Drafts, MD 06/25/19 [phone]

## 2019-06-26 ENCOUNTER — Inpatient Hospital Stay (HOSPITAL_COMMUNITY)
Admit: 2019-06-26 | Discharge: 2019-06-26 | Disposition: A | Discharge status: Home / Self Care | Payer: Medicare Other | Attending: Internal Medicine | Admitting: Internal Medicine

## 2019-06-26 ENCOUNTER — Other Ambulatory Visit: Admission: RE | Admit: 2019-06-26 | Payer: Medicare Other | Source: Ambulatory Visit

## 2019-06-26 ENCOUNTER — Inpatient Hospital Stay: Payer: Self-pay

## 2019-06-26 ENCOUNTER — Other Ambulatory Visit: Payer: Self-pay

## 2019-06-26 ENCOUNTER — Encounter: Payer: Self-pay | Admitting: Internal Medicine

## 2019-06-26 DIAGNOSIS — I483 Typical atrial flutter: Secondary | ICD-10-CM | POA: Diagnosis not present

## 2019-06-26 DIAGNOSIS — J449 Chronic obstructive pulmonary disease, unspecified: Secondary | ICD-10-CM

## 2019-06-26 DIAGNOSIS — I10 Essential (primary) hypertension: Secondary | ICD-10-CM | POA: Diagnosis present

## 2019-06-26 DIAGNOSIS — R634 Abnormal weight loss: Secondary | ICD-10-CM

## 2019-06-26 DIAGNOSIS — E162 Hypoglycemia, unspecified: Secondary | ICD-10-CM

## 2019-06-26 DIAGNOSIS — R918 Other nonspecific abnormal finding of lung field: Secondary | ICD-10-CM | POA: Diagnosis not present

## 2019-06-26 DIAGNOSIS — E43 Unspecified severe protein-calorie malnutrition: Secondary | ICD-10-CM | POA: Diagnosis not present

## 2019-06-26 DIAGNOSIS — I480 Paroxysmal atrial fibrillation: Secondary | ICD-10-CM | POA: Diagnosis not present

## 2019-06-26 DIAGNOSIS — I471 Supraventricular tachycardia: Secondary | ICD-10-CM

## 2019-06-26 DIAGNOSIS — Z87891 Personal history of nicotine dependence: Secondary | ICD-10-CM | POA: Diagnosis not present

## 2019-06-26 DIAGNOSIS — R6510 Systemic inflammatory response syndrome (SIRS) of non-infectious origin without acute organ dysfunction: Secondary | ICD-10-CM | POA: Diagnosis not present

## 2019-06-26 DIAGNOSIS — Z791 Long term (current) use of non-steroidal anti-inflammatories (NSAID): Secondary | ICD-10-CM | POA: Diagnosis not present

## 2019-06-26 DIAGNOSIS — I340 Nonrheumatic mitral (valve) insufficiency: Secondary | ICD-10-CM

## 2019-06-26 DIAGNOSIS — D638 Anemia in other chronic diseases classified elsewhere: Secondary | ICD-10-CM | POA: Diagnosis present

## 2019-06-26 DIAGNOSIS — J189 Pneumonia, unspecified organism: Principal | ICD-10-CM

## 2019-06-26 DIAGNOSIS — C3412 Malignant neoplasm of upper lobe, left bronchus or lung: Secondary | ICD-10-CM | POA: Diagnosis present

## 2019-06-26 DIAGNOSIS — R627 Adult failure to thrive: Secondary | ICD-10-CM | POA: Diagnosis not present

## 2019-06-26 DIAGNOSIS — R64 Cachexia: Secondary | ICD-10-CM | POA: Diagnosis not present

## 2019-06-26 DIAGNOSIS — Z8616 Personal history of COVID-19: Secondary | ICD-10-CM | POA: Diagnosis not present

## 2019-06-26 DIAGNOSIS — I361 Nonrheumatic tricuspid (valve) insufficiency: Secondary | ICD-10-CM

## 2019-06-26 DIAGNOSIS — R680 Hypothermia, not associated with low environmental temperature: Secondary | ICD-10-CM | POA: Diagnosis present

## 2019-06-26 DIAGNOSIS — L8990 Pressure ulcer of unspecified site, unspecified stage: Secondary | ICD-10-CM | POA: Insufficient documentation

## 2019-06-26 DIAGNOSIS — D751 Secondary polycythemia: Secondary | ICD-10-CM

## 2019-06-26 DIAGNOSIS — Z801 Family history of malignant neoplasm of trachea, bronchus and lung: Secondary | ICD-10-CM | POA: Diagnosis not present

## 2019-06-26 DIAGNOSIS — Z7982 Long term (current) use of aspirin: Secondary | ICD-10-CM | POA: Diagnosis not present

## 2019-06-26 DIAGNOSIS — Z66 Do not resuscitate: Secondary | ICD-10-CM | POA: Diagnosis not present

## 2019-06-26 DIAGNOSIS — Z79899 Other long term (current) drug therapy: Secondary | ICD-10-CM | POA: Diagnosis not present

## 2019-06-26 DIAGNOSIS — Z681 Body mass index (BMI) 19 or less, adult: Secondary | ICD-10-CM | POA: Diagnosis not present

## 2019-06-26 DIAGNOSIS — I4892 Unspecified atrial flutter: Secondary | ICD-10-CM | POA: Diagnosis not present

## 2019-06-26 DIAGNOSIS — D649 Anemia, unspecified: Secondary | ICD-10-CM

## 2019-06-26 DIAGNOSIS — Z802 Family history of malignant neoplasm of other respiratory and intrathoracic organs: Secondary | ICD-10-CM | POA: Diagnosis not present

## 2019-06-26 DIAGNOSIS — I9589 Other hypotension: Secondary | ICD-10-CM | POA: Diagnosis present

## 2019-06-26 DIAGNOSIS — R630 Anorexia: Secondary | ICD-10-CM

## 2019-06-26 LAB — ECHOCARDIOGRAM COMPLETE
Height: 68 in
Weight: 1474.44 oz

## 2019-06-26 LAB — GLUCOSE, CAPILLARY
Glucose-Capillary: 113 mg/dL — ABNORMAL HIGH (ref 70–99)
Glucose-Capillary: 120 mg/dL — ABNORMAL HIGH (ref 70–99)
Glucose-Capillary: 133 mg/dL — ABNORMAL HIGH (ref 70–99)
Glucose-Capillary: 136 mg/dL — ABNORMAL HIGH (ref 70–99)
Glucose-Capillary: 18 mg/dL — CL (ref 70–99)
Glucose-Capillary: 42 mg/dL — CL (ref 70–99)
Glucose-Capillary: 76 mg/dL (ref 70–99)
Glucose-Capillary: 81 mg/dL (ref 70–99)
Glucose-Capillary: 88 mg/dL (ref 70–99)
Glucose-Capillary: 91 mg/dL (ref 70–99)
Glucose-Capillary: 93 mg/dL (ref 70–99)
Glucose-Capillary: 99 mg/dL (ref 70–99)
Glucose-Capillary: 99 mg/dL (ref 70–99)

## 2019-06-26 LAB — CBC
HCT: 26.8 % — ABNORMAL LOW (ref 39.0–52.0)
Hemoglobin: 9 g/dL — ABNORMAL LOW (ref 13.0–17.0)
MCH: 27.2 pg (ref 26.0–34.0)
MCHC: 33.6 g/dL (ref 30.0–36.0)
MCV: 81 fL (ref 80.0–100.0)
Platelets: 615 10*3/uL — ABNORMAL HIGH (ref 150–400)
RBC: 3.31 MIL/uL — ABNORMAL LOW (ref 4.22–5.81)
RDW: 17.7 % — ABNORMAL HIGH (ref 11.5–15.5)
WBC: 14.8 10*3/uL — ABNORMAL HIGH (ref 4.0–10.5)
nRBC: 0 % (ref 0.0–0.2)

## 2019-06-26 LAB — PREALBUMIN: Prealbumin: 15.7 mg/dL — ABNORMAL LOW (ref 18–38)

## 2019-06-26 LAB — COMPREHENSIVE METABOLIC PANEL
ALT: 20 U/L (ref 0–44)
AST: 22 U/L (ref 15–41)
Albumin: 2.6 g/dL — ABNORMAL LOW (ref 3.5–5.0)
Alkaline Phosphatase: 68 U/L (ref 38–126)
Anion gap: 9 (ref 5–15)
BUN: 25 mg/dL — ABNORMAL HIGH (ref 8–23)
CO2: 21 mmol/L — ABNORMAL LOW (ref 22–32)
Calcium: 8 mg/dL — ABNORMAL LOW (ref 8.9–10.3)
Chloride: 106 mmol/L (ref 98–111)
Creatinine, Ser: 0.84 mg/dL (ref 0.61–1.24)
GFR calc Af Amer: >60 mL/min (ref >60)
GFR calc non Af Amer: >60 mL/min (ref >60)
Glucose, Bld: 104 mg/dL — ABNORMAL HIGH (ref 70–99)
Potassium: 3.9 mmol/L (ref 3.5–5.1)
Sodium: 136 mmol/L (ref 135–145)
Total Bilirubin: 1 mg/dL (ref 0.3–1.2)
Total Protein: 5.9 g/dL — ABNORMAL LOW (ref 6.5–8.1)

## 2019-06-26 LAB — T4, FREE: Free T4: 1.21 ng/dL — ABNORMAL HIGH (ref 0.61–1.12)

## 2019-06-26 LAB — MAGNESIUM: Magnesium: 1.7 mg/dL (ref 1.7–2.4)

## 2019-06-26 LAB — LACTIC ACID, PLASMA
Lactic Acid, Venous: 1.7 mmol/L (ref 0.5–1.9)
Lactic Acid, Venous: 2.5 mmol/L (ref 0.5–1.9)

## 2019-06-26 LAB — MRSA PCR SCREENING: MRSA by PCR: NEGATIVE

## 2019-06-26 LAB — HEMOGLOBIN A1C
Hgb A1c MFr Bld: 5.6 % (ref 4.8–5.6)
Mean Plasma Glucose: 114.02 mg/dL

## 2019-06-26 LAB — TSH: TSH: 6.434 u[IU]/mL — ABNORMAL HIGH (ref 0.350–4.500)

## 2019-06-26 LAB — PHOSPHORUS: Phosphorus: 1.1 mg/dL — ABNORMAL LOW (ref 2.5–4.6)

## 2019-06-26 MED ORDER — METOPROLOL TARTRATE 5 MG/5ML IV SOLN
5.0000 mg | INTRAVENOUS | Status: AC | PRN
  Administered 2019-06-26 (×2): 5 mg via INTRAVENOUS
  Filled 2019-06-26: qty 5

## 2019-06-26 MED ORDER — ALBUTEROL SULFATE HFA 108 (90 BASE) MCG/ACT IN AERS
2.0000 | INHALATION_SPRAY | RESPIRATORY_TRACT | Status: DC | PRN
  Filled 2019-06-26: qty 6.7

## 2019-06-26 MED ORDER — ADULT MULTIVITAMIN W/MINERALS CH
1.0000 | ORAL_TABLET | Freq: Every day | ORAL | Status: DC
  Administered 2019-06-27 – 2019-06-30 (×4): 1 via ORAL
  Filled 2019-06-26 (×4): qty 1

## 2019-06-26 MED ORDER — ENOXAPARIN SODIUM 40 MG/0.4ML ~~LOC~~ SOLN
1.0000 mg/kg | Freq: Two times a day (BID) | SUBCUTANEOUS | Status: DC
  Administered 2019-06-26 – 2019-06-27 (×2): 40 mg via SUBCUTANEOUS
  Filled 2019-06-26 (×2): qty 0.4

## 2019-06-26 MED ORDER — AMIODARONE HCL IN DEXTROSE 360-4.14 MG/200ML-% IV SOLN: 60.0000 mg/h | INTRAVENOUS | Status: DC

## 2019-06-26 MED ORDER — FLUTICASONE FUROATE-VILANTEROL 100-25 MCG/INH IN AEPB
1.0000 | INHALATION_SPRAY | Freq: Every morning | RESPIRATORY_TRACT | Status: DC
  Administered 2019-06-26 – 2019-06-30 (×5): 1 via RESPIRATORY_TRACT
  Filled 2019-06-26 (×2): qty 28

## 2019-06-26 MED ORDER — HYDROCODONE-ACETAMINOPHEN 5-325 MG PO TABS: 1.0000 | ORAL_TABLET | ORAL | Status: DC | PRN

## 2019-06-26 MED ORDER — ASPIRIN EC 325 MG PO TBEC
325.0000 mg | DELAYED_RELEASE_TABLET | Freq: Every day | ORAL | Status: DC
  Administered 2019-06-26 – 2019-06-27 (×2): 325 mg via ORAL
  Filled 2019-06-26 (×2): qty 1

## 2019-06-26 MED ORDER — AMIODARONE HCL IN DEXTROSE 360-4.14 MG/200ML-% IV SOLN: 30.0000 mg/h | INTRAVENOUS | Status: DC

## 2019-06-26 MED ORDER — CHLORHEXIDINE GLUCONATE CLOTH 2 % EX PADS
6.0000 | MEDICATED_PAD | Freq: Every day | CUTANEOUS | Status: DC
  Administered 2019-06-26 – 2019-06-29 (×3): 6 via TOPICAL

## 2019-06-26 MED ORDER — SODIUM CHLORIDE 0.9 % IV SOLN
2.0000 g | Freq: Two times a day (BID) | INTRAVENOUS | Status: DC
  Administered 2019-06-26 – 2019-06-27 (×4): 2 g via INTRAVENOUS
  Filled 2019-06-26 (×7): qty 2

## 2019-06-26 MED ORDER — SODIUM PHOSPHATES 45 MMOLE/15ML IV SOLN
20.0000 mmol | Freq: Once | INTRAVENOUS | Status: AC
  Administered 2019-06-26: 20 mmol via INTRAVENOUS
  Filled 2019-06-26: qty 6.67

## 2019-06-26 MED ORDER — AMIODARONE LOAD VIA INFUSION
150.0000 mg | Freq: Once | INTRAVENOUS | Status: DC
  Filled 2019-06-26: qty 83.34

## 2019-06-26 MED ORDER — SODIUM CHLORIDE 0.9% FLUSH
10.0000 mL | Freq: Two times a day (BID) | INTRAVENOUS | Status: DC
  Administered 2019-06-26 – 2019-06-28 (×4): 10 mL

## 2019-06-26 MED ORDER — UMECLIDINIUM BROMIDE 62.5 MCG/INH IN AEPB
1.0000 | INHALATION_SPRAY | Freq: Every morning | RESPIRATORY_TRACT | Status: DC
  Administered 2019-06-27 – 2019-06-30 (×4): 1 via RESPIRATORY_TRACT
  Filled 2019-06-26: qty 7

## 2019-06-26 MED ORDER — DEXTROSE-NACL 5-0.45 % IV SOLN
INTRAVENOUS | Status: DC
  Administered 2019-06-26: 1000 mL via INTRAVENOUS

## 2019-06-26 MED ORDER — METOPROLOL TARTRATE 5 MG/5ML IV SOLN
INTRAVENOUS | Status: AC
  Filled 2019-06-26: qty 5

## 2019-06-26 MED ORDER — SODIUM CHLORIDE 0.9% FLUSH: 10.0000 mL | INTRAVENOUS | Status: DC | PRN

## 2019-06-26 MED ORDER — ENSURE ENLIVE PO LIQD
237.0000 mL | Freq: Three times a day (TID) | ORAL | Status: DC
  Administered 2019-06-27 – 2019-06-29 (×4): 237 mL via ORAL

## 2019-06-26 MED ORDER — MEGESTROL ACETATE 20 MG PO TABS
40.0000 mg | ORAL_TABLET | Freq: Every day | ORAL | Status: DC
  Administered 2019-06-26 – 2019-06-27 (×2): 40 mg via ORAL
  Filled 2019-06-26 (×2): qty 2

## 2019-06-26 MED ORDER — ACETAMINOPHEN 325 MG PO TABS: 650.0000 mg | ORAL_TABLET | Freq: Four times a day (QID) | ORAL | Status: DC | PRN

## 2019-06-26 MED ORDER — FLUTICASONE-UMECLIDIN-VILANT 100-62.5-25 MCG/INH IN AEPB: 1.0000 | INHALATION_SPRAY | Freq: Every day | RESPIRATORY_TRACT | Status: DC

## 2019-06-26 MED ORDER — AMIODARONE HCL 200 MG PO TABS
400.0000 mg | ORAL_TABLET | ORAL | Status: AC
  Administered 2019-06-26 (×2): 400 mg via ORAL
  Filled 2019-06-26 (×2): qty 2

## 2019-06-26 MED ORDER — HEPARIN SODIUM (PORCINE) 5000 UNIT/ML IJ SOLN
5000.0000 [IU] | Freq: Three times a day (TID) | INTRAMUSCULAR | Status: DC
  Administered 2019-06-26: 5000 [IU] via SUBCUTANEOUS
  Filled 2019-06-26: qty 1

## 2019-06-26 MED ORDER — ACETAMINOPHEN 650 MG RE SUPP: 650.0000 mg | Freq: Four times a day (QID) | RECTAL | Status: DC | PRN

## 2019-06-26 MED ORDER — ONDANSETRON HCL 4 MG/2ML IJ SOLN: 4.0000 mg | Freq: Four times a day (QID) | INTRAMUSCULAR | Status: DC | PRN

## 2019-06-26 MED ORDER — ONDANSETRON HCL 4 MG PO TABS: 4.0000 mg | ORAL_TABLET | Freq: Four times a day (QID) | ORAL | Status: DC | PRN

## 2019-06-26 NOTE — Evaluation (Signed)
Occupational Therapy Evaluation Patient Details Name: Corey Carr MRN: [id number] DOB: 1940/10/25 Today's Date: 06/26/2019    History of Present Illness Pt. is a 79 y.o. male with medical history significant for known lung mass x1 month with ongoing unintentional weight loss, lung cancer, COPD and hypotension, with history of COVID-19 pneumonia in 05/08/19, hospitalized in January 2021 with hypercalcemia, dehydration with acute kidney injury. While at a PET scan, pt. experienced BG level of 19, and was admitted to [facility]. PMHx includes: Chronic Hypotension, Hypoglycemia, Failure the Thrive, and Cachexia in the setting of malignancy with large Tumor burden.   Clinical Impression   Pt. presents with weakness, limited activity tolerance, cachexia, and limited functional mobility which hinders his ability to complete basic ADL and IADL functioning. Pt. resides at home with his wife. Pt. was independent with ADLs, and IADL functioning: including meal preparation, and medication management. Pt. was able to drive. Pt. is on 5L O2  HR 95, SO2 100% reading SO2 reading obtained through ear monitor vs. Finger. Pt. requires assist for thorough peri hygiene care secondary to bowel incontinence. Pt. education was provided about energy conservation, and work simplification techniques during activity. Pt. Could benefit from OT services for ADL training, A/E training, and pt. Education about energy conservation, and work simplification techniques, home modification, and DME. Pt. will benefit from follow-up [place] services upon discharge.    Follow Up Recommendations  Home health OT    Equipment Recommendations       Recommendations for Other Services       Precautions / Restrictions Restrictions Weight Bearing Restrictions: No      Mobility Bed Mobility Overal bed mobility: Modified Independent                Transfers Overall transfer level: Needs assistance Equipment used: Rolling walker (2  wheeled) Transfers: Sit to/from Stand Sit to Stand: Min guard              Balance     Sitting balance-Leahy Scale: Good       Standing balance-Leahy Scale: Fair                             ADL either performed or assessed with clinical judgement   ADL Overall ADL's : Needs assistance/impaired Eating/Feeding: Set up;Independent   Grooming: Min guard;Standing   Upper Body Bathing: Set up;Independent   Lower Body Bathing: Set up;Min guard   Upper Body Dressing : Set up;Min guard   Lower Body Dressing: Set up;Minimal assistance       Toileting- Clothing Manipulation and Hygiene: Moderate assistance;Minimal assistance Toileting - Clothing Manipulation Details (indicate cue type and reason): bowel incontinence. Assist for thorough pericare.             Vision Baseline Vision/History: Wears glasses Wears Glasses: Reading only Patient Visual Report: No change from baseline       Perception     Praxis      Pertinent Vitals/Pain Pain Assessment: No/denies pain     Hand Dominance Right   Extremity/Trunk Assessment Upper Extremity Assessment Upper Extremity Assessment: Generalized weakness;Overall WFL for tasks assessed           Communication Communication Communication: No difficulties   Cognition Arousal/Alertness: Awake/alert Behavior During Therapy: WFL for tasks assessed/performed Overall Cognitive Status: Within Functional Limits for tasks assessed  General Comments       Exercises     Shoulder Instructions      Home Living Family/patient expects to be discharged to:: Private residence Living Arrangements: Spouse/significant other Available Help at Discharge: Family;Available 24 hours/day Type of Home: Mobile home Home Access: Stairs to enter(preparing to buid a ramp) Entrance Stairs-Number of Steps: 4 Entrance Stairs-Rails: Left;Right Home Layout: One level      Bathroom Shower/Tub: Occupational psychologist: Standard                Prior Functioning/Environment Level of Independence: Independent with assistive device(s)                 OT Problem List: Decreased strength;Cardiopulmonary status limiting activity;Decreased coordination;Decreased activity tolerance;Decreased safety awareness;Impaired balance (sitting and/or standing);Decreased cognition;Decreased knowledge of use of DME or AE      OT Treatment/Interventions: Self-care/ADL training;Therapeutic exercise;Therapeutic activities;DME and/or AE instruction;Patient/family education;Balance training    OT Goals(Current goals can be found in the care plan section) Acute Rehab OT Goals Patient Stated Goal: To return home OT Goal Formulation: With patient Potential to Achieve Goals: Good  OT Frequency: Min 2X/week   Barriers to D/C: Inaccessible home environment;Decreased caregiver support          Co-evaluation              AM-PAC OT "6 Clicks" Daily Activity     Outcome Measure Help from another person eating meals?: None Help from another person taking care of personal grooming?: A Little Help from another person toileting, which includes using toliet, bedpan, or urinal?: A Little Help from another person bathing (including washing, rinsing, drying)?: A Little Help from another person to put on and taking off regular upper body clothing?: A Little Help from another person to put on and taking off regular lower body clothing?: A Little 6 Click Score: 19   End of Session Equipment Utilized During Treatment: Gait belt;Rolling walker Nurse Communication: Other (comment)  Activity Tolerance: Patient tolerated treatment well Patient left: in bed;with call bell/phone within reach;with bed alarm set;with family/visitor present;with nursing/sitter in room  OT Visit Diagnosis: Other abnormalities of gait and mobility (R26.89);Muscle weakness (generalized)  (M62.81)                Time: 5364-6803 OT Time Calculation (min): 38 min Charges:  OT General Charges $OT Visit: 1 Visit OT Evaluation $OT Eval Moderate Complexity: 1 Mod  Harrel Carina, MS, OTR/L  Harrel Carina 06/26/2019, 9:59 AM

## 2019-06-26 NOTE — Progress Notes (Signed)
Patient's HR remains unchanged and is currently in 140s, irregular rhythm, despite 2 doses of IV lopressor, patient currently is asymptomatic, MD notified, cardio consult has been ordered. As no current beds available in stepdown per ICU charge RN, patient to be transferred to 2A.

## 2019-06-26 NOTE — Evaluation (Signed)
Physical Therapy Evaluation Patient Details Name: Corey Carr MRN: [id number] DOB: [birth year] Today's Date: 06/26/2019   History of Present Illness  Pt admitted for hypoglycemia. Multiple readmissions in past 6 months. History includes lung mass, possible lung cancer, COPD, HTN, and recent Covid 12/17.   Clinical Impression  Pt is a pleasant 79 year old male who was admitted for hypoglycemia. Pt performs bed mobility with mod I, transfers with cga, and ambulation with min assist without AD. Further mobility should be performed with AD. Further mobility deferred this date due to elevated HR, RN notified as pt symptomatic.  All mobility performed on RA with sats in upper 90s at rest and with exertion. Pt demonstrates deficits with strength/endurance/mobility. Would benefit from skilled PT to address above deficits and promote optimal return to PLOF. Recommend transition to [place] upon discharge from acute hospitalization.     Follow Up Recommendations Home health PT;Supervision - Intermittent;Supervision for mobility/OOB    Equipment Recommendations  None recommended by PT    Recommendations for Other Services       Precautions / Restrictions Precautions Precautions: Fall Precaution Comments: High Fall Restrictions Weight Bearing Restrictions: No      Mobility  Bed Mobility Overal bed mobility: Modified Independent             General bed mobility comments: safe technique with transition to EOB. Uses railing for assist  Transfers Overall transfer level: Needs assistance Equipment used: None Transfers: Sit to/from Stand Sit to Stand: Min guard         General transfer comment: safe technique with upright posture. Slight unsteadiness.   Ambulation/Gait Ambulation/Gait assistance: Min assist Gait Distance (Feet): 5 Feet Assistive device: None Gait Pattern/deviations: Step-to pattern     General Gait Details: ambulated to recliner with min assist secondary to  unsteadiness. Distance limited secondary to increased HR at 120bpm with exertion and pt symptomatic. RN notified. HR decreases to 96bpm with seated position.   Stairs            Wheelchair Mobility    Modified Rankin (Stroke Patients Only)       Balance Overall balance assessment: Needs assistance Sitting-balance support: Feet supported Sitting balance-Leahy Scale: Good     Standing balance support: During functional activity;No upper extremity supported Standing balance-Leahy Scale: Fair Standing balance comment: slight increased lateral sway during mobility                             Pertinent Vitals/Pain Pain Assessment: No/denies pain    Home Living Family/patient expects to be discharged to:: Private residence Living Arrangements: Spouse/significant other Available Help at Discharge: Family;Available 24 hours/day Type of Home: Mobile home Home Access: Stairs to enter Entrance Stairs-Rails: Chemical engineer of Steps: 5 Home Layout: One level Home Equipment: Walker - 2 wheels;Walker - 4 wheels;Cane - single point;Toilet riser;Grab bars - tub/shower      Prior Function Level of Independence: Independent with assistive device(s)         Comments: indep in home, uses AD in community. Drives. Reports no falls.     Hand Dominance   Dominant Hand: Right    Extremity/Trunk Assessment   Upper Extremity Assessment Upper Extremity Assessment: Generalized weakness(B UE grossly 4/5)    Lower Extremity Assessment Lower Extremity Assessment: Generalized weakness(B LE grossly 4+/5)       Communication   Communication: No difficulties  Cognition Arousal/Alertness: Awake/alert Behavior During Therapy: WFL for tasks assessed/performed  Overall Cognitive Status: Within Functional Limits for tasks assessed                                        General Comments      Exercises Other Exercises Other Exercises:  alt seated marching, LAQ, and supine SLRs. 10 reps with supervision   Assessment/Plan    PT Assessment Patient needs continued PT services  PT Problem List Decreased strength;Decreased activity tolerance;Decreased balance;Decreased mobility       PT Treatment Interventions DME instruction;Balance training;Gait training;Functional mobility training;Therapeutic activities;Therapeutic exercise;Patient/family education    PT Goals (Current goals can be found in the Care Plan section)  Acute Rehab PT Goals Patient Stated Goal: To return home PT Goal Formulation: With patient Time For Goal Achievement: 07/10/19 Potential to Achieve Goals: Good    Frequency Min 2X/week   Barriers to discharge        Co-evaluation               AM-PAC PT "6 Clicks" Mobility  Outcome Measure Help needed turning from your back to your side while in a flat bed without using bedrails?: None Help needed moving from lying on your back to sitting on the side of a flat bed without using bedrails?: None Help needed moving to and from a bed to a chair (including a wheelchair)?: A Little Help needed standing up from a chair using your arms (e.g., wheelchair or bedside chair)?: A Little Help needed to walk in hospital room?: A Little Help needed climbing 3-5 steps with a railing? : A Little 6 Click Score: 20    End of Session Equipment Utilized During Treatment: Gait belt Activity Tolerance: Treatment limited secondary to medical complications (Comment) Patient left: in chair;with chair alarm set Nurse Communication: Mobility status PT Visit Diagnosis: Difficulty in walking, not elsewhere classified (R26.2);Muscle weakness (generalized) (M62.81);Unsteadiness on feet (R26.81)    Time: 6333-5456 PT Time Calculation (min) (ACUTE ONLY): 25 min   Charges:   PT Evaluation $PT Eval Moderate Complexity: 1 Mod PT Treatments $Therapeutic Exercise: 8-22 mins        Corey Carr, PT,  DPT [phone]   Corey Carr 06/26/2019, 11:46 AM

## 2019-06-26 NOTE — Consult Note (Signed)
ANTICOAGULATION CONSULT NOTE - Initial Consult  Pharmacy Consult for Lovenox Dosing Indication: atrial fibrillation  No Known Allergies  Patient Measurements: Height: 5\' 8"  (172.7 cm) Weight: 92 lb 2.4 oz (41.8 kg) IBW/kg (Calculated) : 68.4 Heparin Dosing Weight: 41.8 kg  Vital Signs: Temp: 97.7 F (36.5 C) (02/04 1600) Temp Source: Axillary (02/04 1400) BP: 84/44 (02/04 1900) Pulse Rate: 28 (02/04 1700)  Labs: Recent Labs    06/24/19 1030 06/24/19 1030 06/25/19 1245 06/26/19 0404  HGB 10.3*   < > 10.1* 9.0*  HCT 31.9*  --  30.0* 26.8*  PLT 643*  --  719* 615*  CREATININE 0.98  --  1.12 0.84   < > = values in this interval not displayed.    Estimated Creatinine Clearance: 42.9 mL/min (by C-G formula based on SCr of 0.84 mg/dL).   Medical History: Past Medical History:  Diagnosis Date  . Hypertension     Medications:  Medications Prior to Admission  Medication Sig Dispense Refill Last Dose  . albuterol (VENTOLIN HFA) 108 (90 Base) MCG/ACT inhaler Inhale 2 puffs into the lungs See admin instructions. Inhale 2 puffs into the lungs every 3 hours as needed for wheezing.   06/24/2019 at 0900  . aspirin 325 MG tablet Take 1 tablet by mouth daily.   06/24/2019 at 0900  . dexamethasone (DECADRON) 4 MG tablet Take 1 tablet (4 mg total) by mouth daily. 30 tablet 1 06/24/2019 at Unknown time  . esomeprazole (NEXIUM) 20 MG capsule Take 20 mg by mouth daily at 12 noon.   06/25/2019 at 1200  . feeding supplement, ENSURE ENLIVE, (ENSURE ENLIVE) LIQD Take 237 mLs by mouth 3 (three) times daily between meals. 237 mL 12 06/24/2019 at 1500  . fluticasone (FLONASE) 50 MCG/ACT nasal spray Place 2 sprays into both nostrils daily as needed for allergies or rhinitis.   06/24/2019 at 0900  . Fluticasone-Umeclidin-Vilant (TRELEGY ELLIPTA) 100-62.5-25 MCG/INH AEPB Inhale 1 puff into the lungs daily.   06/24/2019 at 2000  . HYDROcodone-acetaminophen (NORCO/VICODIN) 5-325 MG tablet Take 1 tablet by mouth  every 6 (six) hours as needed. 30 tablet 0 06/25/2019 at 0900  . loratadine (ALLERGY RELIEF) 10 MG tablet Take 1 tablet (10 mg total) by mouth daily. 30 tablet 1 06/24/2019 at 0900  . megestrol (MEGACE) 40 MG tablet Take 40 mg by mouth daily.   06/24/2019 at 0900  . meloxicam (MOBIC) 15 MG tablet Take 15 mg by mouth daily as needed for pain.   06/24/2019 at 0900  . omega-3 acid ethyl esters (LOVAZA) 1 g capsule Take 1 g by mouth daily.   06/24/2019 at 0900  . vitamin B-12 (CYANOCOBALAMIN) 500 MCG tablet Take 500 mcg by mouth daily.   06/24/2019 at 0900  . vitamin C (ASCORBIC ACID) 250 MG tablet Take 500 mg by mouth daily.    06/24/2019 at 0900  . vitamin E (VITAMIN E) 400 UNIT capsule Take 400 Units by mouth daily.   06/24/2019 at 0900  . [facility] GLYCERIN ADULT 2 g suppository SMARTSIG:1 SUPPOS Rectally As Needed     . polyethylene glycol powder (GLYCOLAX/MIRALAX) 17 GM/SCOOP powder Take 17 g by mouth 2 (two) times daily. Mix in 4-8 ounces of fluid prior to taking.   prn at prn  . senna-docusate (SENOKOT-S) 8.6-50 MG tablet Take 1 tablet by mouth at bedtime as needed for mild constipation. 30 tablet 0 prn at prn   Scheduled:  . amiodarone  150 mg Intravenous Once  . amiodarone  400 mg Oral Q4H  . aspirin EC  325 mg Oral Daily  . Chlorhexidine Gluconate Cloth  6 each Topical Daily  . enoxaparin (LOVENOX) injection  1 mg/kg Subcutaneous Q12H  . [START ON 06/27/2019] feeding supplement (ENSURE ENLIVE)  237 mL Oral TID BM  . fluticasone furoate-vilanterol  1 puff Inhalation q morning - 10a   And  . umeclidinium bromide  1 puff Inhalation q morning - 10a  . megestrol  40 mg Oral Daily  . [START ON 06/27/2019] multivitamin with minerals  1 tablet Oral Daily   Infusions:  . amiodarone     Followed by  . [START ON 06/27/2019] amiodarone    . azithromycin    . ceFEPime (MAXIPIME) IV 2 g (06/26/19 1547)  . dextrose 5 % and 0.45% NaCl 1,000 mL (06/26/19 1431)   PRN: acetaminophen **OR** acetaminophen, albuterol,  HYDROcodone-acetaminophen, ondansetron **OR** ondansetron (ZOFRAN) IV Anti-infectives (From admission, onward)   Start     Dose/Rate Route Frequency Ordered Stop   06/26/19 2100  azithromycin (ZITHROMAX) 500 mg in sodium chloride 0.9 % 250 mL IVPB     500 mg 250 mL/hr over 60 Minutes Intravenous Every 24 hours 06/25/19 2137 07/01/19 2059   06/26/19 2000  cefTRIAXone (ROCEPHIN) 2 g in sodium chloride 0.9 % 100 mL IVPB  Status:  Discontinued     2 g 200 mL/hr over 30 Minutes Intravenous Every 24 hours 06/25/19 2137 06/26/19 1341   06/26/19 1400  ceFEPIme (MAXIPIME) 2 g in sodium chloride 0.9 % 100 mL IVPB     2 g 200 mL/hr over 30 Minutes Intravenous Every 12 hours 06/26/19 1350     06/25/19 2015  vancomycin (VANCOCIN) IVPB 1000 mg/200 mL premix     1,000 mg 200 mL/hr over 60 Minutes Intravenous  Once 06/25/19 2010 06/25/19 2333   06/25/19 2015  ceFEPIme (MAXIPIME) 2 g in sodium chloride 0.9 % 100 mL IVPB     2 g 200 mL/hr over 30 Minutes Intravenous  Once 06/25/19 2010 06/25/19 2204   06/25/19 2015  azithromycin (ZITHROMAX) 500 mg in sodium chloride 0.9 % 250 mL IVPB     500 mg 250 mL/hr over 60 Minutes Intravenous  Once 06/25/19 2010 06/25/19 2333   06/25/19 0000  cefdinir (OMNICEF) 300 MG capsule     300 mg Oral 2 times daily 06/25/19 1506     06/25/19 0000  azithromycin (ZITHROMAX Z-PAK) 250 MG tablet        06/25/19 1506 06/30/19 2359      Assessment: Pharmacy has been consulted to initiate Enoxaparin therapeutic dosing in 79 yo patient with suspected lung cancer, COPD and Covid pneumonia in December 2020, currently with Atrial Flutter with RVR. Reports some shortness of breath but mild. If unable to restore normal sinus rhythm or better rate control, may need cardioversion.  Patient was previously ordered subcutaneous Heparin 5,000 units every 8 hours. Last dose administered was 2/4@1529 .   Goal of Therapy:  Monitor platelets by anticoagulation protocol: Yes   Plan:  Lovenox  40mg  Q12 hours has been ordered, which roughly equals 1mg /kg. Platelets are currently elevated, hemoglobin trending down but will continue to monitor.  CBC daily.  Jesson Foskey A Holdan Stucke 06/26/2019,7:16 PM

## 2019-06-26 NOTE — Progress Notes (Signed)
Hypoglycemic Event  CBG: 42 @ 1021  Treatment: 2 cups orange juice  Symptoms: asymptomatic  Follow-up CBG: Time:1038 CBG Result:76  Possible Reasons for Event:   Comments/MD notified:MD notified, orders to keep IV fluids running, encourage more juice and ensures.    Jacquelynn Friend D Burgandy Hackworth

## 2019-06-26 NOTE — Consult Note (Signed)
Pharmacy Antibiotic Note  Corey Carr is a 79 y.o. male admitted on 06/25/2019 with pneumonia (recent hospitalization with COVID PNA).    Pharmacy has been consulted for Cefepime dosing.  Plan: Will dose Cefepime 2g q 12H  Height: 5\' 8"  (172.7 cm) Weight: 87 lb 4.8 oz (39.6 kg) IBW/kg (Calculated) : 68.4  Temp (24hrs), Avg:97.7 F (36.5 C), Min:97.6 F (36.4 C), Max:97.8 F (36.6 C)  Recent Labs  Lab 06/24/19 1030 06/25/19 1245 06/25/19 2058 06/25/19 2337 06/26/19 0404  WBC 15.8* 19.0*  --   --  14.8*  CREATININE 0.98 1.12  --   --  0.84  LATICACIDVEN  --   --  3.7* 2.5* 1.7    Estimated Creatinine Clearance: 40.6 mL/min (by C-G formula based on SCr of 0.84 mg/dL).    No Known Allergies  Antimicrobials this admission: Azithromycin 2/3 >> Cefepime 2/3 >>  Dose adjustments this admission: None  Microbiology results: BCx pending Sputum Cx pending  Thank you for allowing pharmacy to be a part of this patient's care.  Lu Duffel, PharmD, BCPS Clinical Pharmacist 06/26/2019 1:52 PM

## 2019-06-26 NOTE — Progress Notes (Addendum)
Initial Nutrition Assessment  DOCUMENTATION CODES:   Severe malnutrition in context of chronic illness  INTERVENTION:  Patient is likely experiencing refeeding syndrome from the dextrose in D5-1/2NS. Recommend continuing to monitor potassium, phosphorus, and magnesium at least once daily and replacing as needed.  Beginning 2/5 provide Ensure Enlive po TID, each supplement provides 350 kcal and 20 grams of protein. Patient prefers chocolate.  Beginning 2/5 provide Magic cup TID with meals, each supplement provides 290 kcal and 9 grams of protein. Patient prefers chocolate.  Provide daily MVI.  NUTRITION DIAGNOSIS:   Severe Malnutrition related to chronic illness(COPD, lung mass suspicious for malignancy) as evidenced by severe fat depletion, severe muscle depletion, 30.2% weight loss over 4 months.  GOAL:   Patient will meet greater than or equal to 90% of their needs  MONITOR:   PO intake, Supplement acceptance, Labs, Weight trends, Skin, I & O's  REASON FOR ASSESSMENT:   Malnutrition Screening Tool, Consult Malnutrition Eval  ASSESSMENT:   79 year old male with PMHx of HTN, COPD, hx COVID-19 PNA 05/08/2019, severe malnutrition, ongoing work-up for lung mass suspicious for malignancy admitted with hypoglycemia, PNA.   Met with patient at bedside this AM. He is known to this RD from previous admission. Patient reports his appetite had started to pick up some but he was still not able to eat very well. He is unable to provide many details on intake. He is amenable to drinking ONS to help meet calorie/protein needs. Patient enjoys chocolate Ensure and chocolate Magic Cup.  Patient's UBW was 145 lbs (65.9 kg). He was 69.5 kg on 11/14/2016 but there was limited weight history after that until recently. He was 56.7 kg on 03/06/2019, 53.1 kg on 04/16/2019, 42.3 kg on 06/18/2019, and is now 39.6 kg (87.3 lbs). He has now lost 17.1 kg (30.2% body weight) over almost 4 months, which is  significant for time frame.  Medications reviewed and include: Megace 40 mg daily, azithromycin, cefepime, D5-1/2NS at 75 mL/hr, sodium phosphate 20 mmol IV once today.  Labs reviewed: CBG 42-133, CO22 1, BUN 25, Phosphorus 1.1.  Noted patient will be transferring to step down unit.  NUTRITION - FOCUSED PHYSICAL EXAM:    Most Recent Value  Orbital Region  Severe depletion  Upper Arm Region  Severe depletion  Thoracic and Lumbar Region  Severe depletion  Buccal Region  Severe depletion  Temple Region  Severe depletion  Clavicle Bone Region  Severe depletion  Clavicle and Acromion Bone Region  Severe depletion  Scapular Bone Region  Severe depletion  Dorsal Hand  Severe depletion  Patellar Region  Severe depletion  Anterior Thigh Region  Severe depletion  Posterior Calf Region  Severe depletion  Edema (RD Assessment)  None  Hair  Reviewed  Eyes  Reviewed  Mouth  Reviewed  Skin  Reviewed  Nails  Reviewed     Diet Order:   Diet Order            Diet regular Room service appropriate? Yes; Fluid consistency: Thin  Diet effective now             EDUCATION NEEDS:   No education needs have been identified at this time  Skin:  Skin Assessment: Skin Integrity Issues:(stg II coccyx)  Last BM:  06/26/2019 small type 7  Height:   Ht Readings from Last 1 Encounters:  06/25/19 '5\' 8"'  (1.727 m)   Weight:   Wt Readings from Last 1 Encounters:  06/26/19 39.6 kg  Ideal Body Weight:  70 kg  BMI:  Body mass index is 13.27 kg/m.  Estimated Nutritional Needs:   Kcal:  1400-1600  Protein:  70-80 grams  Fluid:  1.4-1.6 L/day  Jacklynn Barnacle, MS, RD, LDN Pager number available on Amion

## 2019-06-26 NOTE — Progress Notes (Addendum)
PROGRESS NOTE    Corey Carr  PYP:[id number] DOB: Jun 02, 1940 DOA: 06/25/2019 PCP: [name], MD  Brief Narrative: Corey Carr is a 79 y.o. male with medical history significant for COPD, recent Covid pneumonia, severe malnutrition, hypertension, ongoing work-up for lung mass suspicious for lung cancer. -On 2/3 he was getting a PET scan, during this time he was noted to be hypoglycemic with CBG of 19, subsequently found to be weak and he was transported to the emergency room . -Prior to this he was hospitalized few weeks ago with hypercalcemia, dehydration and acute kidney injury, treated with bisphosphonates, hydration etc. subsequently set up to see oncology in the office for evaluation of lung mass. -Prior to that he was hospitalized in December with Covid pneumonia and was treated with remdesivir and steroids   Assessment & Plan:   Hypoglycemia -Likely secondary to infection, postobstructive pneumonia, malnutrition, ongoing weight loss -Likely very low glycogen stores -Continue IV fluids with dextrose today, attempt to wean this off later today -check hba1c  Postobstructive pneumonia -Versus healthcare associated pneumonia -Continue azithromycin, change ceftriaxone to cefepime today -Also check SLP evaluation to rule out aspiration -Wean O2 as tolerated  Lung mass highly suspicious for malignancy -Follow-up with oncology, was unable to complete his PET scan, given his overall condition I doubt he would be a candidate for aggressive cancer treatment -Needs goals of care discussions  Recent hypercalcemia likely from malignancy -Improved, recently treated with Zometa, fluids etc.  COPD -Stable, no wheezing at this time  Severe protein calorie malnutrition -Add supplements as tolerated  Arrhythmia Addendum: called by RN late morning regarding SVT, ordered IV metoprolol x2 -did have some bradycardia overnight, I suspect he likely has conduction system disease -Also  likely hypothyroid, TSH is high, will check free T4 -Check echocardiogram  DVT prophylaxis: Heparin subcutaneous Code Status: Full code, discussed CODE STATUS with the patient, he wishes to be a full code Family Communication: No family at bedside, called and updated wife Corey Carr Disposition Plan: Home pending clinical improvement  Consultants:   Cardiology   Procedures:   Antimicrobials:    Subjective: -Feels better today, some cough, denies any shortness of breath -Reports ongoing weight loss  Objective: Vitals:   06/26/19 0054 06/26/19 0057 06/26/19 0747 06/26/19 1057  BP: 111/67  121/71 121/81  Pulse: 66  87   Resp: (!) 21  (!) 22   Temp: 97.8 F (36.6 C)  97.6 F (36.4 C)   TempSrc: Oral  Oral   SpO2: (!) 65% 100% 100%   Weight:      Height:        Intake/Output Summary (Last 24 hours) at 06/26/2019 1059 Last data filed at 06/26/2019 2458 Gross per 24 hour  Intake 1204.11 ml  Output 150 ml  Net 1054.11 ml   Filed Weights   06/25/19 1248 06/26/19 0000  Weight: 43.1 kg 39.6 kg    Examination:  General exam: Extremely cachectic male appears older than stated age, AAOx3, no distress respiratory system: Poor air movement bilaterally Cardiovascular system: S1 & S2 heard, RRR.  Gastrointestinal system: Abdomen is nondistended, soft and nontender.Normal bowel sounds heard. Central nervous system: Alert and oriented. No focal neurological deficits. Extremities: No edema Skin: No rashes Psychiatry: Mood & affect appropriate.     Data Reviewed:   CBC: Recent Labs  Lab 06/24/19 1030 06/25/19 1245 06/26/19 0404  WBC 15.8* 19.0* 14.8*  NEUTROABS 12.1*  --   --   HGB 10.3* 10.1* 9.0*  HCT 31.9* 30.0* 26.8*  MCV 82.0 80.2 81.0  PLT 643* 719* 062*   Basic Metabolic Panel: Recent Labs  Lab 06/24/19 1030 06/25/19 1245 06/26/19 0404  NA 137 136 136  K 3.9 4.1 3.9  CL 104 103 106  CO2 20* 22 21*  GLUCOSE 97 195* 104*  BUN 21 31* 25*  CREATININE  0.98 1.12 0.84  CALCIUM 9.1 8.6* 8.0*  MG  --   --  1.7  PHOS  --   --  1.1*   GFR: Estimated Creatinine Clearance: 40.6 mL/min (by C-G formula based on SCr of 0.84 mg/dL). Liver Function Tests: Recent Labs  Lab 06/24/19 1030 06/25/19 1245 06/26/19 0404  AST 18 19 22   ALT 17 17 20   ALKPHOS 85 82 68  BILITOT 0.8 0.7 1.0  PROT 6.9 6.9 5.9*  ALBUMIN 3.1* 3.0* 2.6*   No results for input(s): LIPASE, AMYLASE in the last 168 hours. No results for input(s): AMMONIA in the last 168 hours. Coagulation Profile: No results for input(s): INR, PROTIME in the last 168 hours. Cardiac Enzymes: No results for input(s): CKTOTAL, CKMB, CKMBINDEX, TROPONINI in the last 168 hours. BNP (last 3 results) No results for input(s): PROBNP in the last 8760 hours. HbA1C: No results for input(s): HGBA1C in the last 72 hours. CBG: Recent Labs  Lab 06/26/19 0349 06/26/19 0548 06/26/19 0800 06/26/19 1021 06/26/19 1038  GLUCAP 88 81 91 42* 76   Lipid Profile: No results for input(s): CHOL, HDL, LDLCALC, TRIG, CHOLHDL, LDLDIRECT in the last 72 hours. Thyroid Function Tests: Recent Labs    06/26/19 0404  TSH 6.434*   Anemia Panel: No results for input(s): VITAMINB12, FOLATE, FERRITIN, TIBC, IRON, RETICCTPCT in the last 72 hours. Urine analysis:    Component Value Date/Time   COLORURINE AMBER (A) 06/25/2019 1704   APPEARANCEUR HAZY (A) 06/25/2019 1704   APPEARANCEUR Clear 11/14/2016 1538   LABSPEC 1.024 06/25/2019 1704   PHURINE 5.0 06/25/2019 1704   GLUCOSEU 50 (A) 06/25/2019 1704   HGBUR NEGATIVE 06/25/2019 1704   BILIRUBINUR NEGATIVE 06/25/2019 1704   BILIRUBINUR Negative 11/14/2016 1538   KETONESUR NEGATIVE 06/25/2019 1704   PROTEINUR 30 (A) 06/25/2019 1704   NITRITE NEGATIVE 06/25/2019 1704   LEUKOCYTESUR NEGATIVE 06/25/2019 1704   Sepsis Labs: @LABRCNTIP (procalcitonin:4,lacticidven:4)  ) Recent Results (from the past 240 hour(s))  SARS CORONAVIRUS 2 (TAT 6-24 HRS)  Nasopharyngeal Nasopharyngeal Swab     Status: None   Collection Time: 06/17/19  9:57 PM   Specimen: Nasopharyngeal Swab  Result Value Ref Range Status   SARS Coronavirus 2 NEGATIVE NEGATIVE Final    Comment: (NOTE) SARS-CoV-2 target nucleic acids are NOT DETECTED. The SARS-CoV-2 RNA is generally detectable in upper and lower respiratory specimens during the acute phase of infection. Negative results do not preclude SARS-CoV-2 infection, do not rule out co-infections with other pathogens, and should not be used as the sole basis for treatment or other patient management decisions. Negative results must be combined with clinical observations, patient history, and epidemiological information. The expected result is Negative. Fact Sheet for Patients: SugarRoll.be Fact Sheet for Healthcare Providers: https://www.woods-mathews.com/ This test is not yet approved or cleared by the [place] FDA and  has been authorized for detection and/or diagnosis of SARS-CoV-2 by FDA under an Emergency Use Authorization (EUA). This EUA will remain  in effect (meaning this test can be used) for the duration of the COVID-19 declaration under Section 56 4(b)(1) of the Act, 21 U.S.C. section 360bbb-3(b)(1), unless the  authorization is terminated or revoked sooner. Performed at [facility], [place] [address], [place], [place] [address]   Blood culture (routine x 2)     Status: None (Preliminary result)   Collection Time: 06/25/19  8:58 PM   Specimen: BLOOD  Result Value Ref Range Status   Specimen Description BLOOD LEFT ASSIST CONTROL  Final   Special Requests   Final    BOTTLES DRAWN AEROBIC AND ANAEROBIC Blood Culture adequate volume   Culture   Final    NO GROWTH < 12 HOURS Performed at [facility], [address] [place], [place] [address]    Report Status PENDING  Incomplete  Blood culture (routine x 2)     Status: None (Preliminary  result)   Collection Time: 06/25/19  8:58 PM   Specimen: BLOOD  Result Value Ref Range Status   Specimen Description BLOOD LEFT ASSIST CONTROL  Final   Special Requests   Final    BOTTLES DRAWN AEROBIC AND ANAEROBIC Blood Culture results may not be optimal due to an excessive volume of blood received in culture bottles   Culture   Final    NO GROWTH < 12 HOURS Performed at [facility], [address], [place], [place] [address]    Report Status PENDING  Incomplete         Radiology Studies: DG Chest Port 1 View  Result Date: 06/25/2019 CLINICAL DATA:  Weakness, COVID-19 positive EXAM: PORTABLE CHEST 1 VIEW COMPARISON:  06/17/2019 FINDINGS: Chronic volume loss with consolidation in the left hemithorax, stable from prior. There may be mildly increased opacity within the aerated portion of the left lung. Heart size is stable. Mediastinal structures remain shifted to the left. There is hyperexpansion of the right lung field with chronic emphysematous changes and right apical scarring. No pneumothorax. IMPRESSION: Chronic volume loss with consolidation in the left hemithorax, stable from prior. There may be mildly increased opacity within the aerated portion of the left lung, which could reflect pneumonia given the patient's history. Electronically Signed   By: Davina Poke D.O.   On: 06/25/2019 13:31        Scheduled Meds: . aspirin EC  325 mg Oral Daily  . fluticasone furoate-vilanterol  1 puff Inhalation q morning - 10a   And  . umeclidinium bromide  1 puff Inhalation q morning - 10a  . megestrol  40 mg Oral Daily   Continuous Infusions: . azithromycin    . cefTRIAXone (ROCEPHIN)  IV    . dextrose 5 % and 0.45% NaCl 75 mL/hr at 06/26/19 0500  . sodium phosphate  Dextrose 5% IVPB       LOS: 0 days    Time spent: 4min    Domenic Polite, MD [facility]  06/26/2019, 10:59 AM

## 2019-06-26 NOTE — Progress Notes (Signed)
OT Cancellation Note  Patient Details Name: MADDOX HLAVATY MRN: [id number] DOB: Apr 07, 1941   Cancelled Treatment:    Reason Eval/Treat Not Completed: Patient not medically ready. Pt now transferred to CCU secondary to HR control. Mews score 7. Due to change in status, will dc current order. Please re-order when medically stable.  Jeni Salles, MPH, MS, OTR/L ascom [phone] 06/26/19, 3:15 PM

## 2019-06-26 NOTE — Consult Note (Signed)
Cardiology Consultation:   Patient ID: Corey Carr MRN: [id number]; DOB: 02-Feb-1941  Admit date: 06/25/2019 Date of Consult: 06/26/2019  Primary Care Provider: [name], MD Primary Cardiologist: New to [place]:  Rockey Situ Physician requesting consult: Dr.Preetha joseph Reason for consult: Narrow complex tachycardia with RVR/flutter   Patient Profile:   Corey Carr is a 79 y.o. male with a hx of lung mass, suspected lung cancer, COPD, hypertension, Covid pneumonia December 2020, hospitalized January 2021 hypercalcemia, dehydration, renal failure, anorexia, presenting to the emergency room for hypoglycemia.  Did not eat anything yesterday morning, went in for his appointment, sugar got low, felt weak, felt better after drinking juice, glucose measured 57 Was admitted for further observation Unsteady on his feet, reports having anorexia at home, dramatic weight loss Recently seen by Dr. Grayland Ormond, suspected cancer Patient has rectal pain, thinks he has colon cancer   History of Present Illness:   Was getting a PET scan June 25, 2019, noted to be hypoglycemic measurement of 19, transported to the emergency room with weakness  Concern for pneumonia, was started on antibiotics Dextrose IV  This morning noted to have acute onset tachycardia with normal sinus rhythm rate in the 70s, acutely heart rate up to 140 even 150 narrow complex  Blood pressure chronically low, 40-98 systolic noted Reports he is asymptomatic  Telemetry reviewed showing atrial flutter rate 150 bpm No improvement in rate with metoprolol IV push x2 earlier today   Heart Pathway Score:     Past Medical History:  Diagnosis Date  . Hypertension     Past Surgical History:  Procedure Laterality Date  . KNEE SURGERY Right 1957     Home Medications:  Prior to Admission medications   Medication Sig Start Date End Date Taking? Authorizing Provider  albuterol (VENTOLIN HFA) 108 (90 Base) MCG/ACT inhaler  Inhale 2 puffs into the lungs See admin instructions. Inhale 2 puffs into the lungs every 3 hours as needed for wheezing. 06/16/19 06/15/20 Yes [provider]  aspirin 325 MG tablet Take 1 tablet by mouth daily.   Yes [provider]  dexamethasone (DECADRON) 4 MG tablet Take 1 tablet (4 mg total) by mouth daily. 06/24/19  Yes Lloyd Huger, MD  esomeprazole (NEXIUM) 20 MG capsule Take 20 mg by mouth daily at 12 noon.   Yes [provider]  feeding supplement, ENSURE ENLIVE, (ENSURE ENLIVE) LIQD Take 237 mLs by mouth 3 (three) times daily between meals. 05/12/19  Yes Nicole Kindred A, DO  fluticasone (FLONASE) 50 MCG/ACT nasal spray Place 2 sprays into both nostrils daily as needed for allergies or rhinitis.   Yes [provider]  Fluticasone-Umeclidin-Vilant (TRELEGY ELLIPTA) 100-62.5-25 MCG/INH AEPB Inhale 1 puff into the lungs daily.   Yes [provider]  HYDROcodone-acetaminophen (NORCO/VICODIN) 5-325 MG tablet Take 1 tablet by mouth every 6 (six) hours as needed. 06/24/19  Yes Lloyd Huger, MD  loratadine (ALLERGY RELIEF) 10 MG tablet Take 1 tablet (10 mg total) by mouth daily. 05/13/19  Yes Ezekiel Slocumb, DO  megestrol (MEGACE) 40 MG tablet Take 40 mg by mouth daily. 05/02/19  Yes [provider]  meloxicam (MOBIC) 15 MG tablet Take 15 mg by mouth daily as needed for pain. 06/17/19  Yes [provider]  omega-3 acid ethyl esters (LOVAZA) 1 g capsule Take 1 g by mouth daily.   Yes [provider]  vitamin B-12 (CYANOCOBALAMIN) 500 MCG tablet Take 500 mcg by mouth daily.   Yes  [provider]  vitamin C (ASCORBIC ACID) 250 MG tablet Take 500 mg by mouth daily.    Yes [provider]  vitamin E (VITAMIN E) 400 UNIT capsule Take 400 Units by mouth daily.   Yes [provider]  azithromycin (ZITHROMAX Z-PAK) 250 MG tablet Take 2 tablets (500 mg) on  Day 1,  followed by 1 tablet (250 mg)  once daily on Days 2 through 5. 06/25/19 06/30/19  Lavonia Drafts, MD  cefdinir (OMNICEF) 300 MG capsule Take 1 capsule (300 mg total) by mouth 2 (two) times daily. 06/25/19   Lavonia Drafts, MD  [facility] GLYCERIN ADULT 2 g suppository SMARTSIG:1 SUPPOS Rectally As Needed 06/09/19   [provider]  polyethylene glycol powder (GLYCOLAX/MIRALAX) 17 GM/SCOOP powder Take 17 g by mouth 2 (two) times daily. Mix in 4-8 ounces of fluid prior to taking. 06/09/19   [provider]  senna-docusate (SENOKOT-S) 8.6-50 MG tablet Take 1 tablet by mouth at bedtime as needed for mild constipation. 06/19/19 07/19/19  Sidney Ace, MD    Inpatient Medications: Scheduled Meds: . amiodarone  150 mg Intravenous Once  . amiodarone  400 mg Oral Q4H  . aspirin EC  325 mg Oral Daily  . Chlorhexidine Gluconate Cloth  6 each Topical Daily  . enoxaparin (LOVENOX) injection  1 mg/kg Subcutaneous Q12H  . [START ON 06/27/2019] feeding supplement (ENSURE ENLIVE)  237 mL Oral TID BM  . fluticasone furoate-vilanterol  1 puff Inhalation q morning - 10a   And  . umeclidinium bromide  1 puff Inhalation q morning - 10a  . megestrol  40 mg Oral Daily  . [START ON 06/27/2019] multivitamin with minerals  1 tablet Oral Daily   Continuous Infusions: . amiodarone     Followed by  . [START ON 06/27/2019] amiodarone    . azithromycin    . ceFEPime (MAXIPIME) IV 2 g (06/26/19 1547)  . dextrose 5 % and 0.45% NaCl 1,000 mL (06/26/19 1431)   PRN Meds: acetaminophen **OR** acetaminophen, albuterol, HYDROcodone-acetaminophen, ondansetron **OR** ondansetron (ZOFRAN) IV  Allergies:   No Known Allergies  Social History:   Social History   Socioeconomic History  . Marital status: Married    Spouse name: Not on file  . Number of children: Not on file  . Years of education: Not on file  . Highest education level: Not on file  Occupational History  . Not on file  Tobacco Use  . Smoking status: Former Smoker    Packs/day:  0.50    Years: 60.00    Pack years: 30.00    Types: Cigarettes    Quit date: 2015    Years since quitting: 6.0  . Smokeless tobacco: Never Used  Substance and Sexual Activity  . Alcohol use: No  . Drug use: No  . Sexual activity: Not on file  Other Topics Concern  . Not on file  Social History Narrative  . Not on file   Social Determinants of Health   Financial Resource Strain:   . Difficulty of Paying Living Expenses: Not on file  Food Insecurity:   . Worried About Charity fundraiser in the Last Year: Not on file  . Ran Out of Food in the Last Year: Not on file  Transportation Needs:   . Lack of Transportation (Medical): Not on file  . Lack of Transportation (Non-Medical): Not on file  Physical Activity:   . Days of Exercise per Week: Not on file  . Minutes  of Exercise per Session: Not on file  Stress:   . Feeling of Stress : Not on file  Social Connections:   . Frequency of Communication with Friends and Family: Not on file  . Frequency of Social Gatherings with Friends and Family: Not on file  . Attends Religious Services: Not on file  . Active Member of Clubs or Organizations: Not on file  . Attends Archivist Meetings: Not on file  . Marital Status: Not on file  Intimate Partner Violence:   . Fear of Current or Ex-Partner: Not on file  . Emotionally Abused: Not on file  . Physically Abused: Not on file  . Sexually Abused: Not on file    Family History:    Family History  Problem Relation Age of Onset  . Emphysema Father   . Lung cancer Maternal Uncle      ROS:  Please see the history of present illness.  Review of Systems  Constitutional: Positive for malaise/fatigue and weight loss.  HENT: Negative.   Respiratory: Positive for shortness of breath.   Cardiovascular: Positive for palpitations.  Gastrointestinal: Negative.   Musculoskeletal: Negative.   Neurological: Negative.   Psychiatric/Behavioral: Negative.   All other systems reviewed  and are negative.   Physical Exam/Data:   Vitals:   06/26/19 1500 06/26/19 1600 06/26/19 1700 06/26/19 1800  BP: (!) 93/57 (!) 94/57 (!) 96/56 (!) 89/66  Pulse: 71 70 (!) 28   Resp: (!) 33 (!) 28 (!) 29 (!) 27  Temp:  97.7 F (36.5 C)    TempSrc:      SpO2: 91% 99% (!) 88%   Weight:      Height:        Intake/Output Summary (Last 24 hours) at 06/26/2019 1849 Last data filed at 06/26/2019 0951 Gross per 24 hour  Intake 1204.11 ml  Output 150 ml  Net 1054.11 ml   Last 3 Weights 06/26/2019 06/26/2019 06/25/2019  Weight (lbs) 92 lb 2.4 oz 87 lb 4.8 oz 95 lb  Weight (kg) 41.8 kg 39.6 kg 43.092 kg     Body mass index is 14.01 kg/m.  General: Tachypneic, no distress otherwise, anorexic with diffuse atrophy of muscle HEENT: normal Lymph: no adenopathy Neck: no JVD Endocrine:  No thryomegaly Vascular: No carotid bruits; FA pulses 2+ bilaterally without bruits  Cardiac: Regular, rapid no murmur  Lungs:  clear to auscultation bilaterally, no wheezing, rhonchi or rales  Abd: soft, nontender, no hepatomegaly  Ext: no edema Musculoskeletal: Diffuse muscle atrophy otherwise no deformities Skin: warm and dry  Neuro:  CNs 2-12 intact, no focal abnormalities noted, grossly nonfocal Psych:  Normal affect   EKG:  The EKG was personally reviewed and demonstrates: EKG pending Telemetry:  Telemetry was personally reviewed and demonstrates: Shows atrial flutter rate 140 bpm  Relevant CV Studies: Echocardiogram pending  Laboratory Data:  High Sensitivity Troponin:   Recent Labs  Lab 06/17/19 1511  TROPONINIHS 29*     Chemistry Recent Labs  Lab 06/24/19 1030 06/25/19 1245 06/26/19 0404  NA 137 136 136  K 3.9 4.1 3.9  CL 104 103 106  CO2 20* 22 21*  GLUCOSE 97 195* 104*  BUN 21 31* 25*  CREATININE 0.98 1.12 0.84  CALCIUM 9.1 8.6* 8.0*  GFRNONAA >60 >60 >60  GFRAA >60 >60 >60  ANIONGAP 13 11 9     Recent Labs  Lab 06/24/19 1030 06/25/19 1245 06/26/19 0404  PROT 6.9 6.9  5.9*  ALBUMIN 3.1* 3.0* 2.6*  AST 18 19 22   ALT 17 17 20   ALKPHOS 85 82 68  BILITOT 0.8 0.7 1.0   Hematology Recent Labs  Lab 06/24/19 1030 06/25/19 1245 06/26/19 0404  WBC 15.8* 19.0* 14.8*  RBC 3.89* 3.74* 3.31*  HGB 10.3* 10.1* 9.0*  HCT 31.9* 30.0* 26.8*  MCV 82.0 80.2 81.0  MCH 26.5 27.0 27.2  MCHC 32.3 33.7 33.6  RDW 18.4* 18.3* 17.7*  PLT 643* 719* 615*   BNPNo results for input(s): BNP, PROBNP in the last 168 hours.  DDimer No results for input(s): DDIMER in the last 168 hours.   Radiology/Studies:  DG Chest Port 1 View  Result Date: 06/25/2019 CLINICAL DATA:  Weakness, COVID-19 positive EXAM: PORTABLE CHEST 1 VIEW COMPARISON:  06/17/2019 FINDINGS: Chronic volume loss with consolidation in the left hemithorax, stable from prior. There may be mildly increased opacity within the aerated portion of the left lung. Heart size is stable. Mediastinal structures remain shifted to the left. There is hyperexpansion of the right lung field with chronic emphysematous changes and right apical scarring. No pneumothorax. IMPRESSION: Chronic volume loss with consolidation in the left hemithorax, stable from prior. There may be mildly increased opacity within the aerated portion of the left lung, which could reflect pneumonia given the patient's history. Electronically Signed   By: Davina Poke D.O.   On: 06/25/2019 13:31    Assessment and Plan:   Atrial flutter with RVR Acute onset this morning Mild symptoms, reports some shortness of breath but very mild He denies any prior similar symptoms Notes reviewed, no documentation of prior cardiac atrial arrhythmia --Rate control or restoring normal sinus rhythm may be difficult with medications given hypotension --Also has very poor IV access per nursing --After discussion with hospital service, nursing, we have recommended a PICC line --While waiting for PICC, we will give amiodarone 400 mg p.o. every 4 hours x2 doses --Once PICC  available we will start amiodarone infusion/we will need to give bolus over 30 minutes to avoid hypotension --- We will discontinue heparin subcu for DVT prophylaxis, give Lovenox dose/therapeutic this evening -Could transition to heparin infusion tomorrow morning --- If unable to restore normal sinus rhythm or better rate control, may need cardioversion.  Would be higher risk given his cachexia, hypertension  Lung mass/cancer Work-up per Dr. Grayland Ormond Has profound anorexia, muscle atrophy  COPD Stable, no exacerbation  Severe protein calorie malnutrition Underlying cancer, no desire to eat May benefit from Megace  Hypoglycemia Poor p.o. intake even this evening per nurses On dextrose   Total encounter time more than 110 minutes  Greater than 50% was spent in counseling and coordination of care with the patient    For questions or updates, please contact [facility] Please consult [email or web address] for contact info under     Signed, Ida Rogue, MD  06/26/2019 6:49 PM

## 2019-06-26 NOTE — Consult Note (Signed)
[place]  Telephone:([phone]) [phone] [phone]) [phone]  ID: Corey Carr OB: December 09, 1940  MR#: [id number]  UUV#:[id number]  Patient Care Team: [name], MD as PCP - General (Family Medicine) Telford Nab, RN as Registered Nurse  CHIEF COMPLAINT: Left lower lobe lung mass concerning for malignancy, hypoglycemia, SVT.  INTERVAL HISTORY: Patient is a 79 year old male who is actively being worked up for left lower lobe lung mass highly concerning for underlying malignancy.  He was noted to have a glucose level of 18 prior to PET scan and was sent to the emergency room for further evaluation.  His blood sugars have improved, but patient was transferred to the ICU with SVT.  He has no neurologic complaints.  He denies any fevers.  He has a poor appetite and continues to lose weight.  He has no chest pain, shortness of breath, cough, or hemoptysis.  He denies any nausea, vomiting, constipation, or diarrhea.  He has had no further abdominal pain.  He has no urinary complaints.  Patient offers no further specific complaints today.  REVIEW OF SYSTEMS:   Review of Systems  Constitutional: Positive for malaise/fatigue and weight loss. Negative for fever.  Respiratory: Negative.  Negative for cough, hemoptysis and shortness of breath.   Cardiovascular: Negative.  Negative for chest pain and leg swelling.  Gastrointestinal: Negative.  Negative for abdominal pain and blood in stool.  Genitourinary: Negative.  Negative for dysuria and hematuria.  Musculoskeletal: Negative.  Negative for back pain.  Skin: Negative.  Negative for rash.  Neurological: Positive for weakness. Negative for dizziness, focal weakness and headaches.  Psychiatric/Behavioral: Negative.  The patient is not nervous/anxious.     As per HPI. Otherwise, a complete review of systems is negative.  PAST MEDICAL HISTORY: Past Medical History:  Diagnosis Date  . Hypertension     PAST SURGICAL  HISTORY: Past Surgical History:  Procedure Laterality Date  . KNEE SURGERY Right 1957    FAMILY HISTORY: Family History  Problem Relation Age of Onset  . Emphysema Father   . Lung cancer Maternal Uncle     ADVANCED DIRECTIVES (Y/N):  @ADVDIR @  HEALTH MAINTENANCE: Social History   Tobacco Use  . Smoking status: Former Smoker    Packs/day: 0.50    Years: 60.00    Pack years: 30.00    Types: Cigarettes    Quit date: 2015    Years since quitting: 6.0  . Smokeless tobacco: Never Used  Substance Use Topics  . Alcohol use: No  . Drug use: No     Colonoscopy:  PAP:  Bone density:  Lipid panel:  No Known Allergies  Current Facility-Administered Medications  Medication Dose Route Frequency Provider Last Rate Last Admin  . acetaminophen (TYLENOL) tablet 650 mg  650 mg Oral Q6H PRN Toy Baker, MD       Or  . acetaminophen (TYLENOL) suppository 650 mg  650 mg Rectal Q6H PRN Doutova, Anastassia, MD      . albuterol (VENTOLIN HFA) 108 (90 Base) MCG/ACT inhaler 2 puff  2 puff Inhalation Q3H PRN Doutova, Anastassia, MD      . amiodarone (NEXTERONE) 1.8 mg/mL load via infusion 150 mg  150 mg Intravenous Once Minna Merritts, MD       Followed by  . amiodarone (NEXTERONE PREMIX) 360-4.14 MG/200ML-% (1.8 mg/mL) IV infusion  60 mg/hr Intravenous Continuous Gollan, Kathlene November, MD       Followed by  . [START ON 06/27/2019] amiodarone (NEXTERONE PREMIX) 360-4.14 MG/200ML-% (  1.8 mg/mL) IV infusion  30 mg/hr Intravenous Continuous Gollan, Kathlene November, MD      . amiodarone (PACERONE) tablet 400 mg  400 mg Oral Q4H Minna Merritts, MD   400 mg at 06/26/19 1819  . aspirin EC tablet 325 mg  325 mg Oral Daily Doutova, Anastassia, MD   325 mg at 06/26/19 1025  . azithromycin (ZITHROMAX) 500 mg in sodium chloride 0.9 % 250 mL IVPB  500 mg Intravenous Q24H Doutova, Anastassia, MD      . ceFEPIme (MAXIPIME) 2 g in sodium chloride 0.9 % 100 mL IVPB  2 g Intravenous Q12H Lu Duffel,  RPH 200 mL/hr at 06/26/19 1547 2 g at 06/26/19 1547  . Chlorhexidine Gluconate Cloth 2 % PADS 6 each  6 each Topical Daily Tyler Pita, MD   6 each at 06/26/19 1423  . dextrose 5 %-0.45 % sodium chloride infusion   Intravenous Continuous Doutova, Anastassia, MD 75 mL/hr at 06/26/19 1431 1,000 mL at 06/26/19 1431  . enoxaparin (LOVENOX) injection 40 mg  1 mg/kg Subcutaneous Q12H Rito Ehrlich A, RPH      . [START ON 06/27/2019] feeding supplement (ENSURE ENLIVE) (ENSURE ENLIVE) liquid 237 mL  237 mL Oral TID BM Domenic Polite, MD      . fluticasone furoate-vilanterol (BREO ELLIPTA) 100-25 MCG/INH 1 puff  1 puff Inhalation q morning - 10a Hall, Scott A, RPH   1 puff at 06/26/19 1025   And  . umeclidinium bromide (INCRUSE ELLIPTA) 62.5 MCG/INH 1 puff  1 puff Inhalation q morning - 10a Hart Robinsons A, RPH      . HYDROcodone-acetaminophen (NORCO/VICODIN) 5-325 MG per tablet 1-2 tablet  1-2 tablet Oral Q4H PRN Doutova, Anastassia, MD      . megestrol (MEGACE) tablet 40 mg  40 mg Oral Daily Doutova, Anastassia, MD   40 mg at 06/26/19 1025  . [START ON 06/27/2019] multivitamin with minerals tablet 1 tablet  1 tablet Oral Daily Domenic Polite, MD      . ondansetron [facility]) tablet 4 mg  4 mg Oral Q6H PRN Toy Baker, MD       Or  . ondansetron (ZOFRAN) injection 4 mg  4 mg Intravenous Q6H PRN Toy Baker, MD        OBJECTIVE: Vitals:   06/26/19 1700 06/26/19 1800  BP: (!) 96/56 (!) 89/66  Pulse: (!) 28   Resp: (!) 29 (!) 27  Temp:    SpO2: (!) 88%      Body mass index is 14.01 kg/m.    ECOG FS:3 - Symptomatic, >50% confined to bed  General: Cachectic, no acute distress. Eyes: Pink conjunctiva, anicteric sclera. HEENT: Normocephalic, moist mucous membranes. Lungs: No audible wheezing or coughing. Heart: Tachycardic.   Abdomen: Soft, nontender, no obvious distention. Musculoskeletal: No edema, cyanosis, or clubbing. Neuro: Alert, answering all questions appropriately.  Cranial nerves grossly intact. Skin: No rashes or petechiae noted. Psych: Normal affect.  LAB RESULTS:  Lab Results  Component Value Date   NA 136 06/26/2019   K 3.9 06/26/2019   CL 106 06/26/2019   CO2 21 (L) 06/26/2019   GLUCOSE 104 (H) 06/26/2019   BUN 25 (H) 06/26/2019   CREATININE 0.84 06/26/2019   CALCIUM 8.0 (L) 06/26/2019   PROT 5.9 (L) 06/26/2019   ALBUMIN 2.6 (L) 06/26/2019   AST 22 06/26/2019   ALT 20 06/26/2019   ALKPHOS 68 06/26/2019   BILITOT 1.0 06/26/2019   GFRNONAA >60 06/26/2019   GFRAA >  60 06/26/2019    Lab Results  Component Value Date   WBC 14.8 (H) 06/26/2019   NEUTROABS 12.1 (H) 06/24/2019   HGB 9.0 (L) 06/26/2019   HCT 26.8 (L) 06/26/2019   MCV 81.0 06/26/2019   PLT 615 (H) 06/26/2019     STUDIES: DG Chest 2 View  Result Date: 06/17/2019 CLINICAL DATA:  Dizziness and shortness of breath. EXAM: CHEST - 2 VIEW COMPARISON:  05/23/2019. FINDINGS: Similar appearance of volume loss in the left hemithorax with elevation of the left hemidiaphragm and left base collapse/consolidative opacity. There is diffuse interstitial and pleural opacity in the left chest. Right lung is hyperexpanded without focal consolidation or pleural effusion. Bones are diffusely demineralized. IMPRESSION: Largely stable exam. Marked volume loss left hemithorax with posterior collapse/consolidative change in diffuse pleural thickening. Electronically Signed   By: Misty Stanley M.D.   On: 06/17/2019 16:43   CT Angio Chest PE W/Cm &/Or Wo Cm  Result Date: 06/17/2019 CLINICAL DATA:  Shortness of breath EXAM: CT ANGIOGRAPHY CHEST WITH CONTRAST TECHNIQUE: Multidetector CT imaging of the chest was performed using the standard protocol during bolus administration of intravenous contrast. Multiplanar CT image reconstructions and MIPs were obtained to evaluate the vascular anatomy. CONTRAST:  13mL OMNIPAQUE IOHEXOL 350 MG/ML SOLN COMPARISON:  03/06/2019 FINDINGS: Cardiovascular: No filling  defects in the pulmonary arteries to suggest pulmonary emboli. Heart is normal size. Aorta normal caliber. Aortic and coronary artery atherosclerosis. Mediastinum/Nodes: There is pneumomediastinum. Gas seen around the main pulmonary artery and in the posterior mediastinum. No adenopathy. Lungs/Pleura: Volume loss on the left with elevation of the left hemidiaphragm. Findings are chronic. There is a chronic masslike area of consolidation in the left lower lobe which is similar to prior study. Advanced centrilobular emphysema and areas of scarring in both lungs. Possible small left effusion, stable. Upper Abdomen: Imaging into the upper abdomen shows no acute findings. Musculoskeletal: Cachexia.  No acute bony abnormality. Review of the MIP images confirms the above findings. IMPRESSION: Pneumomediastinum seen in the middle and posterior mediastinum. No associated pneumothorax. Advanced centrilobular emphysema. Stable chronic volume loss on the left with elevation of the left hemidiaphragm and masslike consolidation in the left lower lobe. As recommended on prior study, bronchoscopy with attention to the left lower lobe and/or PET CT may be helpful to exclude left lower lobe mass lesion. Small right pleural effusion. Areas of scarring in the lungs bilaterally. Aortic Atherosclerosis (ICD10-I70.0) and Emphysema (ICD10-J43.9). Electronically Signed   By: Rolm Baptise M.D.   On: 06/17/2019 21:12   CT Abdomen Pelvis W Contrast  Result Date: 06/17/2019 CLINICAL DATA:  Hypercalcemia. EXAM: CT ABDOMEN AND PELVIS WITH CONTRAST TECHNIQUE: Multidetector CT imaging of the abdomen and pelvis was performed using the standard protocol following bolus administration of intravenous contrast. CONTRAST:  27mL OMNIPAQUE IOHEXOL 350 MG/ML SOLN COMPARISON:  None. FINDINGS: Lower chest: Masslike opacity seen in the left lower lobe as described on prior chest CT. Trace left pleural effusion. Volume loss on the left with elevation of the  left hemidiaphragm. Advanced emphysema. Scarring in the right lower lobe. Hepatobiliary: No focal hepatic abnormality. Gallbladder unremarkable. Pancreas: Calcifications throughout the pancreas compatible with chronic pancreatitis. No evidence of acute pancreatitis, fall suspicious pancreatic lesion or pancreatic ductal dilatation. Spleen: No focal abnormality.  Normal size. Adrenals/Urinary Tract: Small cysts in the left kidney. No hydronephrosis. Adrenal glands and urinary bladder unremarkable. Stomach/Bowel: Large stool burden throughout the colon. Distention of the rectum with stool concerning for fecal impaction. Stomach and  small bowel decompressed, unremarkable. Vascular/Lymphatic: Aortic atherosclerosis. No enlarged abdominal or pelvic lymph nodes. Reproductive: Prostate prominence. Other: No free fluid or free air. Musculoskeletal: Cachexia. No acute bony abnormality. Degenerative disc and facet disease in the lower lumbar spine. IMPRESSION: Masslike opacity in the left lower lobe as described on chest CT concerning for possible pulmonary mass lesions/malignancy. This could be further evaluated with bronchoscopy and/or PET CT as described on prior chest CT. Advanced emphysema. Large stool burden throughout the colon. Appearance is concerning for fecal impaction. Chronic pancreatitis changes.  No evidence of acute pancreatitis. Aortic atherosclerosis. Electronically Signed   By: Rolm Baptise M.D.   On: 06/17/2019 21:16   DG Chest Port 1 View  Result Date: 06/25/2019 CLINICAL DATA:  Weakness, COVID-19 positive EXAM: PORTABLE CHEST 1 VIEW COMPARISON:  06/17/2019 FINDINGS: Chronic volume loss with consolidation in the left hemithorax, stable from prior. There may be mildly increased opacity within the aerated portion of the left lung. Heart size is stable. Mediastinal structures remain shifted to the left. There is hyperexpansion of the right lung field with chronic emphysematous changes and right apical  scarring. No pneumothorax. IMPRESSION: Chronic volume loss with consolidation in the left hemithorax, stable from prior. There may be mildly increased opacity within the aerated portion of the left lung, which could reflect pneumonia given the patient's history. Electronically Signed   By: Davina Poke D.O.   On: 06/25/2019 13:31    ASSESSMENT: Left lower lobe lung mass concerning for malignancy, hypoglycemia, SVT.  PLAN:    1.  Left lower lobe lung mass: Highly suspicious for underlying malignancy.  PET scan was not completed secondary to hypoglycemia.  This can be rescheduled as an outpatient.  Once patient has PET scan, he will require biopsy to confirm diagnosis. 2.  Hypercalcemia: Patient received IV Zometa on June 18, 2019.  Calcium levels are 8.0 today. 3.  Hypoglycemia: Likely multifactorial including poor glycemic reserve and minimal caloric intake.  Blood glucose levels have significantly improved. 4.  SVT: Patient now in the ICU for rate control. 5.  Anemia: Patient's hemoglobin is trended down to 9.0.  Monitor and transfuse if it falls below 7.0. 6.  Thrombocytosis: Likely reactive, monitor. 7.  Postobstructive pneumonia: Agree with current antibiotics.  Wean oxygen as tolerated. 8.  Weight loss/malnutrition: Patient benefit from dietary consult.  Appreciate consult, will follow.  Lloyd Huger, MD   06/26/2019 6:35 PM

## 2019-06-26 NOTE — Progress Notes (Signed)
Peripherally Inserted Central Catheter/Midline Placement  The IV Nurse has discussed with the patient and/or persons authorized to consent for the patient, the purpose of this procedure and the potential benefits and risks involved with this procedure.  The benefits include less needle sticks, lab draws from the catheter, and the patient may be discharged home with the catheter. Risks include, but not limited to, infection, bleeding, blood clot (thrombus formation), and puncture of an artery; nerve damage and irregular heartbeat and possibility to perform a PICC exchange if needed/ordered by physician.  Alternatives to this procedure were also discussed.  Bard Power PICC patient education guide, fact sheet on infection prevention and patient information card has been provided to patient /or left at bedside.    PICC/Midline Placement Documentation  PICC Triple Lumen 06/26/19 PICC Right Brachial 40 cm 0 cm (Active)  Indication for Insertion or Continuance of Line Prolonged intravenous therapies;Vasoactive infusions 06/26/19 2229  Exposed Catheter (cm) 0 cm 06/26/19 2229  Site Assessment Clean;Dry;Intact 06/26/19 2229  Lumen #1 Status Flushed;Saline locked;Blood return noted 06/26/19 2229  Lumen #2 Status Flushed;Saline locked;Blood return noted 06/26/19 2229  Lumen #3 Status Flushed;Saline locked;Blood return noted 06/26/19 2229  Dressing Type Transparent 06/26/19 2229  Dressing Status Clean;Dry;Intact 06/26/19 2229  Dressing Intervention New dressing 06/26/19 2229  Dressing Change Due 07/03/19 06/26/19 2229       Princella Jaskiewicz, Nicolette Bang 06/26/2019, 10:30 PM

## 2019-06-26 NOTE — Progress Notes (Signed)
Report given to [facility] ICU RN, patient currently in bed with NAD, HR remains in 150's. Will transfer patient.

## 2019-06-26 NOTE — Progress Notes (Signed)
Patient currently in Red ME[facility] see flowsheet.  HR sustaining in 140's-150's, MD notified and orders given for IV lopressor, 5mg  IV lopressor pushed, BP stable, patient is in recliner currently asymptomatic.  Will continue to monitor.

## 2019-06-26 NOTE — Progress Notes (Signed)
PT Cancellation Note  Patient Details Name: Corey Carr MRN: [id number] DOB: [birth year]   Cancelled Treatment:    Reason Eval/Treat Not Completed: Other (comment). Pt now transferred to CCU secondary to HR control. Due to change in status, will dc current order. Please re-order when medically stable.   Maicey Barrientez 06/26/2019, 2:56 PM  Greggory Stallion, PT, DPT [phone]

## 2019-06-27 ENCOUNTER — Ambulatory Visit: Payer: Medicare Other | Admitting: Urology

## 2019-06-27 DIAGNOSIS — I4892 Unspecified atrial flutter: Secondary | ICD-10-CM

## 2019-06-27 LAB — BASIC METABOLIC PANEL
Anion gap: 8 (ref 5–15)
BUN: 14 mg/dL (ref 8–23)
CO2: 21 mmol/L — ABNORMAL LOW (ref 22–32)
Calcium: 7.2 mg/dL — ABNORMAL LOW (ref 8.9–10.3)
Chloride: 110 mmol/L (ref 98–111)
Creatinine, Ser: 0.68 mg/dL (ref 0.61–1.24)
GFR calc Af Amer: >60 mL/min (ref >60)
GFR calc non Af Amer: >60 mL/min (ref >60)
Glucose, Bld: 105 mg/dL — ABNORMAL HIGH (ref 70–99)
Potassium: 3.4 mmol/L — ABNORMAL LOW (ref 3.5–5.1)
Sodium: 139 mmol/L (ref 135–145)

## 2019-06-27 LAB — GLUCOSE, CAPILLARY
Glucose-Capillary: 104 mg/dL — ABNORMAL HIGH (ref 70–99)
Glucose-Capillary: 101 mg/dL — ABNORMAL HIGH (ref 70–99)
Glucose-Capillary: 101 mg/dL — ABNORMAL HIGH (ref 70–99)
Glucose-Capillary: 91 mg/dL (ref 70–99)
Glucose-Capillary: 88 mg/dL (ref 70–99)
Glucose-Capillary: 147 mg/dL — ABNORMAL HIGH (ref 70–99)
Glucose-Capillary: 89 mg/dL (ref 70–99)
Glucose-Capillary: 98 mg/dL (ref 70–99)

## 2019-06-27 LAB — CBC
HCT: 22.6 % — ABNORMAL LOW (ref 39.0–52.0)
Hemoglobin: 7.6 g/dL — ABNORMAL LOW (ref 13.0–17.0)
MCH: 27.2 pg (ref 26.0–34.0)
MCHC: 33.6 g/dL (ref 30.0–36.0)
MCV: 81 fL (ref 80.0–100.0)
Platelets: 521 10*3/uL — ABNORMAL HIGH (ref 150–400)
RBC: 2.79 MIL/uL — ABNORMAL LOW (ref 4.22–5.81)
RDW: 18 % — ABNORMAL HIGH (ref 11.5–15.5)
WBC: 13.8 10*3/uL — ABNORMAL HIGH (ref 4.0–10.5)
nRBC: 0 % (ref 0.0–0.2)

## 2019-06-27 LAB — PHOSPHORUS: Phosphorus: 1.8 mg/dL — ABNORMAL LOW (ref 2.5–4.6)

## 2019-06-27 LAB — TSH: TSH: 4.957 u[IU]/mL — ABNORMAL HIGH (ref 0.350–4.500)

## 2019-06-27 LAB — T4, FREE: Free T4: 1.08 ng/dL (ref 0.61–1.12)

## 2019-06-27 MED ORDER — BUTAMBEN-TETRACAINE-BENZOCAINE 2-2-14 % EX AERO
1.0000 | INHALATION_SPRAY | Freq: Once | CUTANEOUS | Status: DC
  Filled 2019-06-27: qty 20

## 2019-06-27 MED ORDER — LIDOCAINE HCL (PF) 1 % IJ SOLN
30.0000 mL | Freq: Once | INTRAMUSCULAR | Status: DC
  Filled 2019-06-27: qty 30

## 2019-06-27 MED ORDER — POTASSIUM CHLORIDE CRYS ER 20 MEQ PO TBCR
40.0000 meq | EXTENDED_RELEASE_TABLET | Freq: Once | ORAL | Status: AC
  Administered 2019-06-27: 40 meq via ORAL
  Filled 2019-06-27: qty 2

## 2019-06-27 MED ORDER — AMIODARONE HCL 200 MG PO TABS
400.0000 mg | ORAL_TABLET | Freq: Two times a day (BID) | ORAL | Status: AC
  Administered 2019-06-27 (×2): 400 mg via ORAL
  Filled 2019-06-27 (×2): qty 2

## 2019-06-27 MED ORDER — LIDOCAINE HCL URETHRAL/MUCOSAL 2 % EX GEL
1.0000 "application " | Freq: Once | CUTANEOUS | Status: DC
  Filled 2019-06-27: qty 5

## 2019-06-27 MED ORDER — K PHOS MONO-SOD PHOS DI & MONO 155-852-130 MG PO TABS
500.0000 mg | ORAL_TABLET | Freq: Once | ORAL | Status: DC
  Filled 2019-06-27 (×2): qty 2

## 2019-06-27 MED ORDER — SODIUM CHLORIDE 0.9 % IV SOLN
INTRAVENOUS | Status: DC | PRN
  Administered 2019-06-27: 500 mL via INTRAVENOUS

## 2019-06-27 MED ORDER — PHENYLEPHRINE HCL 0.25 % NA SOLN
1.0000 | Freq: Four times a day (QID) | NASAL | Status: DC | PRN
  Filled 2019-06-27: qty 15

## 2019-06-27 NOTE — Consult Note (Signed)
Pharmacy Antibiotic Note  Corey Carr is a 79 y.o. male admitted on 06/25/2019 with pneumonia (recent hospitalization with COVID PNA).  Pharmacy was consulted for Cefepime dosing. Leukocytosis has improved since admission, renal function back to baseline, still on 2L O2 w/ sats >90%  Plan: Continue cefepime 2g IV every 12H  Height: 5\' 8"  (172.7 cm) Weight: 92 lb 2.4 oz (41.8 kg) IBW/kg (Calculated) : 68.4  Temp (24hrs), Avg:98 F (36.7 C), Min:97.6 F (36.4 C), Max:98.4 F (36.9 C)  Recent Labs  Lab 06/24/19 1030 06/25/19 1245 06/25/19 2058 06/25/19 2337 06/26/19 0404 06/27/19 0356  WBC 15.8* 19.0*  --   --  14.8* 13.8*  CREATININE 0.98 1.12  --   --  0.84 0.68  LATICACIDVEN  --   --  3.7* 2.5* 1.7  --     Estimated Creatinine Clearance: 45 mL/min (by C-G formula based on SCr of 0.68 mg/dL).    No Known Allergies  Antimicrobials this admission: Azithromycin 2/3 >>2/9 Cefepime 2/4 >>  Microbiology results: 2/3 BCx NG (pending) 2/4 MRSA PCR negative 1/26 SARS CoV-2 negative   Thank you for allowing pharmacy to be a part of this patient's care.  Dallie Piles, PharmD, BCPS Clinical Pharmacist 06/27/2019 7:10 AM

## 2019-06-27 NOTE — Evaluation (Signed)
Occupational Therapy Evaluation Patient Details Name: Corey Carr MRN: [id number] DOB: 19-Dec-1940 Today's Date: 06/27/2019    History of Present Illness Pt admitted for hypoglycemia. Multiple readmissions in past 6 months. History includes lung mass, possible lung cancer, COPD, HTN, and recent Covid 12/17. Transferred to CCU for SVT and RVR/flutter 06/26/2019, new therapy orders recieved on 06/27/2019   Clinical Impression   Mr. Brabson was seen for OT re-evaluation this date. Of note, pt seen for initial OT evaluation during this admission, however, OT order cancelled after pt transitioned to higher level of care. Pt received in the CCU, resting comfortably in bed. He denies pain but endorses some mild discomfort in his stomach which he attibutes to eating too much apple sauce. Pt continues to be functionally limited by activity tolerance, cachexia, and limited functional mobility which hinders his ability to complete basic ADL and IADL functioning. Pt requires supervision to [place] for functional mobility. He requires max assist for bed level peri-care this date.  Pt would benefit from skilled OT to address noted impairments and functional limitations (see below for any additional details) in order to maximize safety and independence while minimizing falls risk and caregiver burden. Upon hospital discharge, recommend HHOT to maximize pt safety and return to functional independence during meaningful occupations of daily life.      Follow Up Recommendations  Home health OT;Supervision - Intermittent    Equipment Recommendations  Other (comment)(TBD)    Recommendations for Other Services       Precautions / Restrictions Precautions Precautions: Fall Precaution Comments: High Fall Restrictions Weight Bearing Restrictions: No      Mobility Bed Mobility Overal bed mobility: Modified Independent             General bed mobility comments: safe technique with transition to EOB. Uses  railing for assist. Increased time/effort to perform. Rolls side to side in bed for peri-care w/o physical assist from therapist.  Transfers Overall transfer level: Needs assistance Equipment used: 1 person hand held assist Transfers: Sit to/from Stand Sit to Stand: Min guard;Supervision         General transfer comment: Steady side-stepping EOB to reposition/allow for linnen change.    Balance Overall balance assessment: Needs assistance Sitting-balance support: Feet unsupported;Single extremity supported Sitting balance-Leahy Scale: Good Sitting balance - Comments: steady sitting EOB w/o feet supported (limited by height of bed)   Standing balance support: During functional activity;No upper extremity supported Standing balance-Leahy Scale: Fair Standing balance comment: improved balance noted with RW                           ADL either performed or assessed with clinical judgement   ADL Overall ADL's : Needs assistance/impaired                                       General ADL Comments: Pt functionally limited by generalized weakness, decreased activity tolerance, and cardiopulmonary status. Requires set-up to supervision for seated ADL management. This date requires max assist for mgt of peri-care in bed, however pt has demonstrated improved functional independence with peri care when able to sit upright on commode. Pt also continues to be incontinent of bowel.     Vision Baseline Vision/History: Wears glasses Wears Glasses: Reading only Patient Visual Report: No change from baseline       Perception  Praxis      Pertinent Vitals/Pain Pain Assessment: Faces Faces Pain Scale: Hurts a little bit Pain Location: Pt endorsess tightness in stomach "from applesauce". Pain Descriptors / Indicators: Tightness Pain Intervention(s): Limited activity within patient's tolerance;Monitored during session;Repositioned     Hand Dominance Right    Extremity/Trunk Assessment Upper Extremity Assessment Upper Extremity Assessment: Generalized weakness(Grossly 4/5 t/o BUE Grip WFL, [facility] WFL.)   Lower Extremity Assessment Lower Extremity Assessment: Generalized weakness   Cervical / Trunk Assessment Cervical / Trunk Assessment: Kyphotic   Communication Communication Communication: No difficulties   Cognition Arousal/Alertness: Awake/alert Behavior During Therapy: WFL for tasks assessed/performed Overall Cognitive Status: Within Functional Limits for tasks assessed                                     General Comments  Pt incontinent of bowels, diaper donned prior to mobility.    Exercises General Exercises - Lower Extremity Ankle Circles/Pumps: AROM;Strengthening;Both;10 reps Heel Slides: AROM;Strengthening;Both;10 reps Other Exercises Other Exercises: OT engages pt in bed mobility, functional transfer, and bed-level peri-care 2/2 episode of bowel incontinence this date. pt educated on falls prevention strategies, safe transfer technique, and bed mobility technique throughout session.   Shoulder Instructions      Home Living Family/patient expects to be discharged to:: Private residence Living Arrangements: Spouse/significant other Available Help at Discharge: Family;Available 24 hours/day Type of Home: Mobile home Home Access: Stairs to enter Entrance Stairs-Number of Steps: 5 Entrance Stairs-Rails: Left;Right Home Layout: One level     Bathroom Shower/Tub: Occupational psychologist: Standard     Home Equipment: Environmental consultant - 2 wheels;Walker - 4 wheels;Cane - single point;Toilet riser;Grab bars - tub/shower          Prior Functioning/Environment Level of Independence: Independent with assistive device(s)        Comments: indep in home, uses AD in community. Drives. Reports no falls.        OT Problem List: Decreased strength;Cardiopulmonary status limiting activity;Decreased  coordination;Decreased activity tolerance;Decreased safety awareness;Impaired balance (sitting and/or standing);Decreased cognition;Decreased knowledge of use of DME or AE      OT Treatment/Interventions: Self-care/ADL training;Therapeutic exercise;Therapeutic activities;DME and/or AE instruction;Patient/family education;Balance training    OT Goals(Current goals can be found in the care plan section) Acute Rehab OT Goals Patient Stated Goal: To return home OT Goal Formulation: With patient Time For Goal Achievement: 07/11/19 Potential to Achieve Goals: Good  OT Frequency: Min 2X/week   Barriers to D/C: Inaccessible home environment;Decreased caregiver support          Co-evaluation              AM-PAC OT "6 Clicks" Daily Activity     Outcome Measure Help from another person eating meals?: None Help from another person taking care of personal grooming?: A Little Help from another person toileting, which includes using toliet, bedpan, or urinal?: A Little Help from another person bathing (including washing, rinsing, drying)?: A Little Help from another person to put on and taking off regular upper body clothing?: A Little Help from another person to put on and taking off regular lower body clothing?: A Little 6 Click Score: 19   End of Session Equipment Utilized During Treatment: Gait belt;Rolling walker Nurse Communication: Other (comment)(Pt sacral foam removed after found to be soiled by BM.)  Activity Tolerance: Patient tolerated treatment well Patient left: in bed;with call bell/phone within reach;with bed  alarm set;with family/visitor present;with nursing/sitter in room  OT Visit Diagnosis: Other abnormalities of gait and mobility (R26.89);Muscle weakness (generalized) (M62.81)                Time: 1642-9037 OT Time Calculation (min): 41 min Charges:  OT General Charges $OT Visit: 1 Visit OT Evaluation $OT Re-eval: 1 Re-eval OT Treatments $Self Care/Home  Management : 38-52 mins  Shara Blazing, M.S., OTR/L Ascom: [phone] 06/27/19, 3:59 PM  06/27/2019, 3:50 PM

## 2019-06-27 NOTE — Progress Notes (Signed)
Physical Therapy RE-Evaluation Patient Details Name: Corey Carr MRN: [id number] DOB: Jan 21, 1941 Today's Date: 06/27/2019    History of Present Illness Pt admitted for hypoglycemia. Multiple readmissions in past 6 months. History includes lung mass, possible lung cancer, COPD, HTN, and recent Covid 12/17. Transferred to CCU for SVT and RVR/flutter 06/26/2019, new PT orders recieved.   PT Comments    Pt alert, agreeable to ambulate with PT but does not want to sit up in the recliner, reported it was too hard to adjust it. Brief donned with rolling in bed, he patient demonstrated bed mobility mod I. Sat EOB with fair balance, feet supported. HR and SpO2 monitored throughout mobility, WFLs (pt on 2L via [place] throughout session). Sit <> stand with CGA/supervision and RW. The patient was able to ambulate ~56ft with RW and CGA. Pt did endorse some fatigue after, but no unsteadiness noted. Occasional assistance with RW direction, cues for hand placement. Pt in bed with all needs in reach, CNA notified of pt BM and need of change. POC and goals reviewed but still appropriate so remain unchanged. Recommendation is HHPT with intermittent supervision and for mobility/OOB for safety.     Follow Up Recommendations  Home health PT;Supervision - Intermittent;Supervision for mobility/OOB     Equipment Recommendations  Rolling walker with 5" wheels    Recommendations for Other Services       Precautions / Restrictions Precautions Precautions: Fall Precaution Comments: High Fall Restrictions Weight Bearing Restrictions: No    Mobility  Bed Mobility Overal bed mobility: Modified Independent             General bed mobility comments: safe technique with transition to EOB. Uses railing for assist  Transfers Overall transfer level: Needs assistance Equipment used: Rolling walker (2 wheeled) Transfers: Sit to/from Stand Sit to Stand: Min guard;Supervision         General transfer comment:  cued for RW use  Ambulation/Gait Ambulation/Gait assistance: Min guard Gait Distance (Feet): 55 Feet Assistive device: Rolling walker (2 wheeled)       General Gait Details: Patient ambulated with CGA, pull up donned. occasional assistance with RW management but overall steady. Pt did endorse fatigue after ambulation.   Stairs             Wheelchair Mobility    Modified Rankin (Stroke Patients Only)       Balance Overall balance assessment: Needs assistance Sitting-balance support: Feet supported Sitting balance-Leahy Scale: Good Sitting balance - Comments: steady sitting EOB   Standing balance support: During functional activity;No upper extremity supported Standing balance-Leahy Scale: Fair Standing balance comment: improved balance noted with RW                            Cognition Arousal/Alertness: Awake/alert Behavior During Therapy: WFL for tasks assessed/performed Overall Cognitive Status: Within Functional Limits for tasks assessed                                        Exercises General Exercises - Lower Extremity Ankle Circles/Pumps: AROM;Strengthening;Both;10 reps Heel Slides: AROM;Strengthening;Both;10 reps    General Comments General comments (skin integrity, edema, etc.): Pt incontinent of bowels, diaper donned prior to mobility.      Pertinent Vitals/Pain Pain Assessment: No/denies pain    Home Living Family/patient expects to be discharged to:: Private residence Living Arrangements: Spouse/significant other Available  Help at Discharge: Family;Available 24 hours/day Type of Home: Mobile home Home Access: Stairs to enter Entrance Stairs-Rails: Left;Right Home Layout: One level Home Equipment: Environmental consultant - 2 wheels;Walker - 4 wheels;Cane - single point;Toilet riser;Grab bars - tub/shower      Prior Function Level of Independence: Independent with assistive device(s)      Comments: indep in home, uses AD in  community. Drives. Reports no falls.   PT Goals (current goals can now be found in the care plan section) Progress towards PT goals: Progressing toward goals    Frequency    Min 2X/week      PT Plan Current plan remains appropriate    Co-evaluation              AM-PAC PT "6 Clicks" Mobility   Outcome Measure  Help needed turning from your back to your side while in a flat bed without using bedrails?: A Little Help needed moving from lying on your back to sitting on the side of a flat bed without using bedrails?: A Little   Help needed standing up from a chair using your arms (e.g., wheelchair or bedside chair)?: A Little Help needed to walk in hospital room?: A Little Help needed climbing 3-5 steps with a railing? : A Little 6 Click Score: 15    End of Session Equipment Utilized During Treatment: Gait belt;Oxygen(2L) Activity Tolerance: Patient tolerated treatment well Patient left: in bed;with bed alarm set;with call bell/phone within reach Nurse Communication: Mobility status PT Visit Diagnosis: Difficulty in walking, not elsewhere classified (R26.2);Muscle weakness (generalized) (M62.81);Unsteadiness on feet (R26.81)     Time: 7014-1030 PT Time Calculation (min) (ACUTE ONLY): 22 min  Charges:  $Therapeutic Exercise: 8-22 mins                    Lieutenant Diego PT, DPT 3:27 PM,06/27/19

## 2019-06-27 NOTE — Consult Note (Signed)
[name] for Lovenox Dosing Indication: atrial fibrillation  No Known Allergies  Patient Measurements: Height: 5\' 8"  (172.7 cm) Weight: 92 lb 2.4 oz (41.8 kg) IBW/kg (Calculated) : 68.4 Heparin Dosing Weight: 41.8 kg  Vital Signs: Temp: 98.2 F (36.8 C) (02/05 0400) Temp Source: Oral (02/05 0400) BP: 94/56 (02/05 0600) Pulse Rate: 66 (02/05 0600)  Labs: Recent Labs    06/25/19 1245 06/25/19 1245 06/26/19 0404 06/27/19 0356  HGB 10.1*   < > 9.0* 7.6*  HCT 30.0*  --  26.8* 22.6*  PLT 719*  --  615* 521*  CREATININE 1.12  --  0.84 0.68   < > = values in this interval not displayed.    Estimated Creatinine Clearance: 45 mL/min (by C-G formula based on SCr of 0.68 mg/dL).   Medical History: Past Medical History:  Diagnosis Date  . Hypertension     Medications:  Medications Prior to Admission  Medication Sig Dispense Refill Last Dose  . albuterol (VENTOLIN HFA) 108 (90 Base) MCG/ACT inhaler Inhale 2 puffs into the lungs See admin instructions. Inhale 2 puffs into the lungs every 3 hours as needed for wheezing.   06/24/2019 at 0900  . aspirin 325 MG tablet Take 1 tablet by mouth daily.   06/24/2019 at 0900  . dexamethasone (DECADRON) 4 MG tablet Take 1 tablet (4 mg total) by mouth daily. 30 tablet 1 06/24/2019 at Unknown time  . esomeprazole (NEXIUM) 20 MG capsule Take 20 mg by mouth daily at 12 noon.   06/25/2019 at 1200  . feeding supplement, ENSURE ENLIVE, (ENSURE ENLIVE) LIQD Take 237 mLs by mouth 3 (three) times daily between meals. 237 mL 12 06/24/2019 at 1500  . fluticasone (FLONASE) 50 MCG/ACT nasal spray Place 2 sprays into both nostrils daily as needed for allergies or rhinitis.   06/24/2019 at 0900  . Fluticasone-Umeclidin-Vilant (TRELEGY ELLIPTA) 100-62.5-25 MCG/INH AEPB Inhale 1 puff into the lungs daily.   06/24/2019 at 2000  . HYDROcodone-acetaminophen (NORCO/VICODIN) 5-325 MG tablet Take 1 tablet by mouth every 6 (six) hours as  needed. 30 tablet 0 06/25/2019 at 0900  . loratadine (ALLERGY RELIEF) 10 MG tablet Take 1 tablet (10 mg total) by mouth daily. 30 tablet 1 06/24/2019 at 0900  . megestrol (MEGACE) 40 MG tablet Take 40 mg by mouth daily.   06/24/2019 at 0900  . meloxicam (MOBIC) 15 MG tablet Take 15 mg by mouth daily as needed for pain.   06/24/2019 at 0900  . omega-3 acid ethyl esters (LOVAZA) 1 g capsule Take 1 g by mouth daily.   06/24/2019 at 0900  . vitamin B-12 (CYANOCOBALAMIN) 500 MCG tablet Take 500 mcg by mouth daily.   06/24/2019 at 0900  . vitamin C (ASCORBIC ACID) 250 MG tablet Take 500 mg by mouth daily.    06/24/2019 at 0900  . vitamin E (VITAMIN E) 400 UNIT capsule Take 400 Units by mouth daily.   06/24/2019 at 0900  . [facility] GLYCERIN ADULT 2 g suppository SMARTSIG:1 SUPPOS Rectally As Needed     . polyethylene glycol powder (GLYCOLAX/MIRALAX) 17 GM/SCOOP powder Take 17 g by mouth 2 (two) times daily. Mix in 4-8 ounces of fluid prior to taking.   prn at prn  . senna-docusate (SENOKOT-S) 8.6-50 MG tablet Take 1 tablet by mouth at bedtime as needed for mild constipation. 30 tablet 0 prn at prn   Scheduled:  . amiodarone  150 mg Intravenous Once  . aspirin EC  325 mg  Oral Daily  . Chlorhexidine Gluconate Cloth  6 each Topical Daily  . enoxaparin (LOVENOX) injection  1 mg/kg Subcutaneous Q12H  . feeding supplement (ENSURE ENLIVE)  237 mL Oral TID BM  . fluticasone furoate-vilanterol  1 puff Inhalation q morning - 10a   And  . umeclidinium bromide  1 puff Inhalation q morning - 10a  . megestrol  40 mg Oral Daily  . multivitamin with minerals  1 tablet Oral Daily  . sodium chloride flush  10-40 mL Intracatheter Q12H   Infusions:  . amiodarone Stopped (06/27/19 0010)  . azithromycin 500 mg (06/26/19 2251)  . ceFEPime (MAXIPIME) IV 2 g (06/26/19 2254)  . dextrose 5 % and 0.45% NaCl 75 mL/hr at 06/26/19 2246   PRN: acetaminophen **OR** acetaminophen, albuterol, HYDROcodone-acetaminophen, ondansetron **OR**  ondansetron (ZOFRAN) IV, sodium chloride flush Anti-infectives (From admission, onward)   Start     Dose/Rate Route Frequency Ordered Stop   06/26/19 2100  azithromycin (ZITHROMAX) 500 mg in sodium chloride 0.9 % 250 mL IVPB     500 mg 250 mL/hr over 60 Minutes Intravenous Every 24 hours 06/25/19 2137 07/01/19 2059   06/26/19 2000  cefTRIAXone (ROCEPHIN) 2 g in sodium chloride 0.9 % 100 mL IVPB  Status:  Discontinued     2 g 200 mL/hr over 30 Minutes Intravenous Every 24 hours 06/25/19 2137 06/26/19 1341   06/26/19 1400  ceFEPIme (MAXIPIME) 2 g in sodium chloride 0.9 % 100 mL IVPB     2 g 200 mL/hr over 30 Minutes Intravenous Every 12 hours 06/26/19 1350     06/25/19 2015  vancomycin (VANCOCIN) IVPB 1000 mg/200 mL premix     1,000 mg 200 mL/hr over 60 Minutes Intravenous  Once 06/25/19 2010 06/25/19 2333   06/25/19 2015  ceFEPIme (MAXIPIME) 2 g in sodium chloride 0.9 % 100 mL IVPB     2 g 200 mL/hr over 30 Minutes Intravenous  Once 06/25/19 2010 06/25/19 2204   06/25/19 2015  azithromycin (ZITHROMAX) 500 mg in sodium chloride 0.9 % 250 mL IVPB     500 mg 250 mL/hr over 60 Minutes Intravenous  Once 06/25/19 2010 06/25/19 2333   06/25/19 0000  cefdinir (OMNICEF) 300 MG capsule     300 mg Oral 2 times daily 06/25/19 1506     06/25/19 0000  azithromycin (ZITHROMAX Z-PAK) 250 MG tablet        06/25/19 1506 06/30/19 2359      Assessment: Pharmacy has been consulted to initiate Enoxaparin therapeutic dosing in 79 yo patient with suspected lung cancer, COPD and Covid pneumonia in December 2020, currently with Atrial Flutter with RVR. Reports some shortness of breath but mild. Renal function is at baseline now.  Goal of Therapy:  Monitor platelets by anticoagulation protocol: Yes   Plan:   Continue Lovenox 40mg  (1 mg/kg) Q12 hours   CBC daily.  Corey Carr 06/27/2019,7:09 AM

## 2019-06-27 NOTE — Progress Notes (Signed)
[place]  [phone] [phone]  ID: SEANN GENTHER OB: 05/18/1941  MR#: [id number]  JJK#:[id number]  Patient Care Team: [name], MD as PCP - General (Family Medicine) Telford Nab, RN as Registered Nurse  CHIEF COMPLAINT: Left lower lobe lung mass concerning for malignancy, hypoglycemia, SVT.  INTERVAL HISTORY: Patient feels significantly improved since admission.  Blood glucose has stabilized and he no longer has SVT.  He has chronic weakness and fatigue, but offers no further complaints today.  REVIEW OF SYSTEMS:   Review of Systems  Constitutional: Positive for malaise/fatigue and weight loss. Negative for fever.  Respiratory: Negative.  Negative for cough and shortness of breath.   Cardiovascular: Negative.  Negative for chest pain and leg swelling.  Gastrointestinal: Negative.  Negative for abdominal pain.  Genitourinary: Negative.  Negative for dysuria.  Musculoskeletal: Negative.  Negative for back pain.  Skin: Negative.  Negative for rash.  Neurological: Positive for weakness. Negative for dizziness and focal weakness.  Psychiatric/Behavioral: Negative.  The patient is not nervous/anxious.     As per HPI. Otherwise, a complete review of systems is negative.  PAST MEDICAL HISTORY: Past Medical History:  Diagnosis Date  . Hypertension     PAST SURGICAL HISTORY: Past Surgical History:  Procedure Laterality Date  . KNEE SURGERY Right 1957    FAMILY HISTORY: Family History  Problem Relation Age of Onset  . Emphysema Father   . Lung cancer Maternal Uncle     ADVANCED DIRECTIVES (Y/N):  @ADVDIR @  HEALTH MAINTENANCE: Social History   Tobacco Use  . Smoking status: Former Smoker    Packs/day: 0.50    Years: 60.00    Pack years: 30.00    Types: Cigarettes    Quit date: 2015    Years since quitting: 6.1  . Smokeless tobacco: Never Used  Substance Use Topics  . Alcohol use: No  . Drug use: No      Colonoscopy:  PAP:  Bone density:  Lipid panel:  No Known Allergies  Current Facility-Administered Medications  Medication Dose Route Frequency Provider Last Rate Last Admin  . acetaminophen (TYLENOL) tablet 650 mg  650 mg Oral Q6H PRN Toy Baker, MD       Or  . acetaminophen (TYLENOL) suppository 650 mg  650 mg Rectal Q6H PRN Doutova, Anastassia, MD      . albuterol (VENTOLIN HFA) 108 (90 Base) MCG/ACT inhaler 2 puff  2 puff Inhalation Q3H PRN Doutova, Anastassia, MD      . amiodarone (PACERONE) tablet 400 mg  400 mg Oral BID Minna Merritts, MD   400 mg at 06/27/19 1144  . azithromycin (ZITHROMAX) 500 mg in sodium chloride 0.9 % 250 mL IVPB  500 mg Intravenous Q24H Doutova, Anastassia, MD 250 mL/hr at 06/26/19 2251 500 mg at 06/26/19 2251  . ceFEPIme (MAXIPIME) 2 g in sodium chloride 0.9 % 100 mL IVPB  2 g Intravenous Q12H Lu Duffel, RPH 200 mL/hr at 06/27/19 1123 2 g at 06/27/19 1123  . Chlorhexidine Gluconate Cloth 2 % PADS 6 each  6 each Topical Daily Tyler Pita, MD   6 each at 06/27/19 1000  . dextrose 5 %-0.45 % sodium chloride infusion   Intravenous Continuous Domenic Polite, MD 50 mL/hr at 06/27/19 1002 Rate Change at 06/27/19 1002  . feeding supplement (ENSURE ENLIVE) (ENSURE ENLIVE) liquid 237 mL  237 mL Oral TID BM Domenic Polite, MD   237 mL at 06/27/19 1001  . fluticasone furoate-vilanterol (  BREO ELLIPTA) 100-25 MCG/INH 1 puff  1 puff Inhalation q morning - 10a Hall, Scott A, RPH   1 puff at 06/27/19 1126   And  . umeclidinium bromide (INCRUSE ELLIPTA) 62.5 MCG/INH 1 puff  1 puff Inhalation q morning - 10a Hall, Scott A, RPH   1 puff at 06/27/19 1006  . HYDROcodone-acetaminophen (NORCO/VICODIN) 5-325 MG per tablet 1-2 tablet  1-2 tablet Oral Q4H PRN Toy Baker, MD      . multivitamin with minerals tablet 1 tablet  1 tablet Oral Daily Domenic Polite, MD   1 tablet at 06/27/19 1005  . ondansetron (ZOFRAN) tablet 4 mg  4 mg Oral Q6H  PRN Toy Baker, MD       Or  . ondansetron (ZOFRAN) injection 4 mg  4 mg Intravenous Q6H PRN Doutova, Anastassia, MD      . phosphorus (K PHOS NEUTRAL) tablet 500 mg  500 mg Oral Once Domenic Polite, MD      . sodium chloride flush (NS) 0.9 % injection 10-40 mL  10-40 mL Intracatheter Q12H Domenic Polite, MD   10 mL at 06/27/19 1002  . sodium chloride flush (NS) 0.9 % injection 10-40 mL  10-40 mL Intracatheter PRN Domenic Polite, MD        OBJECTIVE: Vitals:   06/27/19 1300 06/27/19 1400  BP:    Pulse: 81 86  Resp: 20 (!) 23  Temp:    SpO2: 100% 100%     Body mass index is 14.01 kg/m.    ECOG FS:2 - Symptomatic, <50% confined to bed  General: Cachectic, no acute distress. Eyes: Pink conjunctiva, anicteric sclera. HEENT: Normocephalic, moist mucous membranes. Lungs: No audible wheezing or coughing. Heart: Regular rate and rhythm. Abdomen: Soft, nontender, no obvious distention. Musculoskeletal: No edema, cyanosis, or clubbing. Neuro: Alert, answering all questions appropriately. Cranial nerves grossly intact. Skin: No rashes or petechiae noted. Psych: Normal affect.  LAB RESULTS:  Lab Results  Component Value Date   NA 139 06/27/2019   K 3.4 (L) 06/27/2019   CL 110 06/27/2019   CO2 21 (L) 06/27/2019   GLUCOSE 105 (H) 06/27/2019   BUN 14 06/27/2019   CREATININE 0.68 06/27/2019   CALCIUM 7.2 (L) 06/27/2019   PROT 5.9 (L) 06/26/2019   ALBUMIN 2.6 (L) 06/26/2019   AST 22 06/26/2019   ALT 20 06/26/2019   ALKPHOS 68 06/26/2019   BILITOT 1.0 06/26/2019   GFRNONAA >60 06/27/2019   GFRAA >60 06/27/2019    Lab Results  Component Value Date   WBC 13.8 (H) 06/27/2019   NEUTROABS 12.1 (H) 06/24/2019   HGB 7.6 (L) 06/27/2019   HCT 22.6 (L) 06/27/2019   MCV 81.0 06/27/2019   PLT 521 (H) 06/27/2019     STUDIES: DG Chest 2 View  Result Date: 06/17/2019 CLINICAL DATA:  Dizziness and shortness of breath. EXAM: CHEST - 2 VIEW COMPARISON:  05/23/2019. FINDINGS:  Similar appearance of volume loss in the left hemithorax with elevation of the left hemidiaphragm and left base collapse/consolidative opacity. There is diffuse interstitial and pleural opacity in the left chest. Right lung is hyperexpanded without focal consolidation or pleural effusion. Bones are diffusely demineralized. IMPRESSION: Largely stable exam. Marked volume loss left hemithorax with posterior collapse/consolidative change in diffuse pleural thickening. Electronically Signed   By: Misty Stanley M.D.   On: 06/17/2019 16:43   CT Angio Chest PE W/Cm &/Or Wo Cm  Result Date: 06/17/2019 CLINICAL DATA:  Shortness of breath EXAM: CT ANGIOGRAPHY CHEST WITH  CONTRAST TECHNIQUE: Multidetector CT imaging of the chest was performed using the standard protocol during bolus administration of intravenous contrast. Multiplanar CT image reconstructions and MIPs were obtained to evaluate the vascular anatomy. CONTRAST:  96mL OMNIPAQUE IOHEXOL 350 MG/ML SOLN COMPARISON:  03/06/2019 FINDINGS: Cardiovascular: No filling defects in the pulmonary arteries to suggest pulmonary emboli. Heart is normal size. Aorta normal caliber. Aortic and coronary artery atherosclerosis. Mediastinum/Nodes: There is pneumomediastinum. Gas seen around the main pulmonary artery and in the posterior mediastinum. No adenopathy. Lungs/Pleura: Volume loss on the left with elevation of the left hemidiaphragm. Findings are chronic. There is a chronic masslike area of consolidation in the left lower lobe which is similar to prior study. Advanced centrilobular emphysema and areas of scarring in both lungs. Possible small left effusion, stable. Upper Abdomen: Imaging into the upper abdomen shows no acute findings. Musculoskeletal: Cachexia.  No acute bony abnormality. Review of the MIP images confirms the above findings. IMPRESSION: Pneumomediastinum seen in the middle and posterior mediastinum. No associated pneumothorax. Advanced centrilobular  emphysema. Stable chronic volume loss on the left with elevation of the left hemidiaphragm and masslike consolidation in the left lower lobe. As recommended on prior study, bronchoscopy with attention to the left lower lobe and/or PET CT may be helpful to exclude left lower lobe mass lesion. Small right pleural effusion. Areas of scarring in the lungs bilaterally. Aortic Atherosclerosis (ICD10-I70.0) and Emphysema (ICD10-J43.9). Electronically Signed   By: Rolm Baptise M.D.   On: 06/17/2019 21:12   CT Abdomen Pelvis W Contrast  Result Date: 06/17/2019 CLINICAL DATA:  Hypercalcemia. EXAM: CT ABDOMEN AND PELVIS WITH CONTRAST TECHNIQUE: Multidetector CT imaging of the abdomen and pelvis was performed using the standard protocol following bolus administration of intravenous contrast. CONTRAST:  71mL OMNIPAQUE IOHEXOL 350 MG/ML SOLN COMPARISON:  None. FINDINGS: Lower chest: Masslike opacity seen in the left lower lobe as described on prior chest CT. Trace left pleural effusion. Volume loss on the left with elevation of the left hemidiaphragm. Advanced emphysema. Scarring in the right lower lobe. Hepatobiliary: No focal hepatic abnormality. Gallbladder unremarkable. Pancreas: Calcifications throughout the pancreas compatible with chronic pancreatitis. No evidence of acute pancreatitis, fall suspicious pancreatic lesion or pancreatic ductal dilatation. Spleen: No focal abnormality.  Normal size. Adrenals/Urinary Tract: Small cysts in the left kidney. No hydronephrosis. Adrenal glands and urinary bladder unremarkable. Stomach/Bowel: Large stool burden throughout the colon. Distention of the rectum with stool concerning for fecal impaction. Stomach and small bowel decompressed, unremarkable. Vascular/Lymphatic: Aortic atherosclerosis. No enlarged abdominal or pelvic lymph nodes. Reproductive: Prostate prominence. Other: No free fluid or free air. Musculoskeletal: Cachexia. No acute bony abnormality. Degenerative disc and  facet disease in the lower lumbar spine. IMPRESSION: Masslike opacity in the left lower lobe as described on chest CT concerning for possible pulmonary mass lesions/malignancy. This could be further evaluated with bronchoscopy and/or PET CT as described on prior chest CT. Advanced emphysema. Large stool burden throughout the colon. Appearance is concerning for fecal impaction. Chronic pancreatitis changes.  No evidence of acute pancreatitis. Aortic atherosclerosis. Electronically Signed   By: Rolm Baptise M.D.   On: 06/17/2019 21:16   DG Chest Port 1 View  Result Date: 06/25/2019 CLINICAL DATA:  Weakness, COVID-19 positive EXAM: PORTABLE CHEST 1 VIEW COMPARISON:  06/17/2019 FINDINGS: Chronic volume loss with consolidation in the left hemithorax, stable from prior. There may be mildly increased opacity within the aerated portion of the left lung. Heart size is stable. Mediastinal structures remain shifted to the left.  There is hyperexpansion of the right lung field with chronic emphysematous changes and right apical scarring. No pneumothorax. IMPRESSION: Chronic volume loss with consolidation in the left hemithorax, stable from prior. There may be mildly increased opacity within the aerated portion of the left lung, which could reflect pneumonia given the patient's history. Electronically Signed   By: Davina Poke D.O.   On: 06/25/2019 13:31   ECHOCARDIOGRAM COMPLETE  Result Date: 06/26/2019   ECHOCARDIOGRAM REPORT   Patient Name:   SALADIN PETRELLI Date of Exam: 06/26/2019 Medical Rec #:  [id number]       Height:       68.0 in Accession #:    [id number]      Weight:       92.2 lb Date of Birth:  Jun 23, 1940       BSA:          1.47 m Patient Age:    4 years        BP:           89/66 mmHg Patient Gender: M               HR:           129 bpm. Exam Location:  [facility] Procedure: 2D Echo Indications:     ATRIAL FIBRILLATION 427.31/I48.91  History:         Patient has no prior history of Echocardiogram examinations.                   COPD; Risk Factors:Hypertension.  Sonographer:     Avanell Shackleton Referring Phys:  [place] Diagnosing Phys: Ida Rogue MD  Sonographer Comments: Image acquisition challenging due to patient body habitus. IMPRESSIONS  1. Left ventricular ejection fraction, by visual estimation, is 50 to 55%. The left ventricle has normal function. There is moderately increased left ventricular hypertrophy.  2. Left ventricular diastolic parameters are indeterminate.  3. The left ventricle has no regional wall motion abnormalities.  4. Global right ventricle has normal systolic function.The right ventricular size is normal. No increase in right ventricular wall thickness.  5. Left atrial size was normal.  6. Tricuspid valve regurgitation is mild-moderate.  7. Mildly elevated pulmonary artery systolic pressure.  8. Tachycardia noted/atrial flutter, rate 130 bpm FINDINGS  Left Ventricle: Left ventricular ejection fraction, by visual estimation, is 50 to 55%. The left ventricle has normal function. The left ventricle has no regional wall motion abnormalities. There is moderately increased left ventricular hypertrophy. Left ventricular diastolic parameters are indeterminate. Normal left atrial pressure. Right Ventricle: The right ventricular size is normal. No increase in right ventricular wall thickness. Global RV systolic function is has normal systolic function. The tricuspid regurgitant velocity is 2.90 m/s, and with an assumed right atrial pressure  of 5 mmHg, the estimated right ventricular systolic pressure is mildly elevated at 38.6 mmHg. Left Atrium: Left atrial size was normal in size. Right Atrium: Right atrial size was normal in size Pericardium: There is no evidence of pericardial effusion. Mitral Valve: The mitral valve is normal in structure. Mild to moderate mitral valve regurgitation. No evidence of mitral valve stenosis by observation. Tricuspid Valve: The tricuspid valve is normal in  structure. Tricuspid valve regurgitation is mild-moderate. Aortic Valve: The aortic valve is normal in structure. Aortic valve regurgitation is not visualized. Mild to moderate aortic valve sclerosis/calcification is present, without any evidence of aortic stenosis. Pulmonic Valve: The pulmonic valve was normal in structure. Pulmonic valve regurgitation is  not visualized. Pulmonic regurgitation is not visualized. Aorta: The aortic root, ascending aorta and aortic arch are all structurally normal, with no evidence of dilitation or obstruction. Venous: The inferior vena cava is normal in size with greater than 50% respiratory variability, suggesting right atrial pressure of 3 mmHg. IAS/Shunts: No atrial level shunt detected by color flow Doppler. There is no evidence of a patent foramen ovale. No ventricular septal defect is seen or detected. There is no evidence of an atrial septal defect.  LEFT VENTRICLE PLAX 2D LVIDd:         2.60 cm LVIDs:         2.26 cm LV PW:         0.68 cm LV IVS:        0.92 cm LVOT diam:     1.70 cm LV SV:         7 ml LV SV Index:   5.30 LVOT Area:     2.27 cm  LEFT ATRIUM             Index       RIGHT ATRIUM           Index LA diam:        2.70 cm 1.84 cm/m  RA Area:     16.00 cm LA Vol (A2C):   58.4 ml 39.72 ml/m RA Volume:   45.70 ml  31.08 ml/m LA Vol (A4C):   33.6 ml 22.85 ml/m LA Biplane Vol: 48.1 ml 32.71 ml/m   AORTA Ao Root diam: 3.30 cm TRICUSPID VALVE TR Peak grad:   33.6 mmHg TR Vmax:        305.00 cm/s  SHUNTS Systemic Diam: 1.70 cm  Ida Rogue MD Electronically signed by Ida Rogue MD Signature Date/Time: 06/26/2019/7:28:13 PM    Final    [place] EKG SITE RITE  Result Date: 06/26/2019 If Site Rite image not attached, placement could not be confirmed due to current cardiac rhythm.   ASSESSMENT: Left lower lobe lung mass concerning for malignancy, hypoglycemia, SVT.  PLAN:    1.  Left lower lobe lung mass: Highly suspicious for underlying malignancy.  PET  scan was not completed secondary to hypoglycemia.  This can be rescheduled as an outpatient.    Appreciate pulmonary input.  Possibly bronchoscopy with biopsy while patient is inpatient. 2.  Hypercalcemia: Patient received IV Zometa on June 18, 2019.  Patient now actually has hypoglycemia. 3.  Hypoglycemia: Resolved.  Likely multifactorial including poor glycemic reserve and minimal caloric intake.   4.  SVT: Resolved. 5.  Anemia: Hemoglobin continues to trend down and is now 7.6. Monitor and transfuse if it falls below 7.0. 6.  Thrombocytosis: Likely reactive, monitor. 7.  Postobstructive pneumonia: Agree with current antibiotics.    O2 supplementation nearly weaned off completely at time of evaluation. 8.  Weight loss/malnutrition: Patient benefit from dietary consult.  Will follow.   Lloyd Huger, MD   06/27/2019 2:12 PM

## 2019-06-27 NOTE — Consult Note (Signed)
Pulmonary Medicine          Date: 06/27/2019,   MRN# [id number] Corey Carr 01/17/1941     AdmissionWeight: 43.1 kg                 CurrentWeight: 41.8 kg      CHIEF COMPLAINT:   Left lung mass with hilar mediastinal lymphadenopathy   HISTORY OF PRESENT ILLNESS   This is a very pleasant 79 year old male with a history of advanced COPD, essential hypertension, chronic fatigue with cachexia likely due to cancer associated weight loss with protein calorie malnutrition, was seen in pulmonary on outpatient basis for abnormal chest x-ray.  Subsequently patient did have CT chest imaging with findings of left infiltrate with concomitant suggestion of mass and hilar/mediastinal lymphadenopathy.  Patient was supposed to have a PET scan done however had a hypoglycemic episode and was subsequently seen in the ED.  He was also found to have narrow complex tachycardia as well as atrial flutter with rapid ventricular response.  Patient was hospitalized in medical stepdown unit with improvement in medical optimization.  I had been in contact with him as well as his family and discussed case and care plan with oncology.  Patient wishes to proceed with tissue diagnosis via bronchoscopy and EBUS assisted lymph node biopsies.   PAST MEDICAL HISTORY   Past Medical History:  Diagnosis Date  . Hypertension      SURGICAL HISTORY   Past Surgical History:  Procedure Laterality Date  . KNEE SURGERY Right 1957     FAMILY HISTORY   Family History  Problem Relation Age of Onset  . Emphysema Father   . Lung cancer Maternal Uncle      SOCIAL HISTORY   Social History   Tobacco Use  . Smoking status: Former Smoker    Packs/day: 0.50    Years: 60.00    Pack years: 30.00    Types: Cigarettes    Quit date: 2015    Years since quitting: 6.1  . Smokeless tobacco: Never Used  Substance Use Topics  . Alcohol use: No  . Drug use: No     MEDICATIONS    Home Medication:    Current Outpatient Rx  . Order #: [id number] Class: Normal  . Order #: [id number] Class: Normal    Current Medication:  Current Facility-Administered Medications:  .  acetaminophen (TYLENOL) tablet 650 mg, 650 mg, Oral, Q6H PRN **OR** acetaminophen (TYLENOL) suppository 650 mg, 650 mg, Rectal, Q6H PRN, Doutova, Anastassia, MD .  albuterol (VENTOLIN HFA) 108 (90 Base) MCG/ACT inhaler 2 puff, 2 puff, Inhalation, Q3H PRN, Doutova, Anastassia, MD .  amiodarone (PACERONE) tablet 400 mg, 400 mg, Oral, BID, Gollan, Kathlene November, MD, 400 mg at 06/27/19 1144 .  azithromycin (ZITHROMAX) 500 mg in sodium chloride 0.9 % 250 mL IVPB, 500 mg, Intravenous, Q24H, Doutova, Anastassia, MD, Stopped at 06/26/19 2351 .  butamben-tetracaine-benzocaine (CETACAINE) spray 1 spray, 1 spray, Topical, Once, Tylan Briguglio, MD .  ceFEPIme (MAXIPIME) 2 g in sodium chloride 0.9 % 100 mL IVPB, 2 g, Intravenous, Q12H, Lu Duffel, RPH, Stopped at 06/27/19 1153 .  Chlorhexidine Gluconate Cloth 2 % PADS 6 each, 6 each, Topical, Daily, Domenic Polite, MD, 6 each at 06/27/19 1000 .  feeding supplement (ENSURE ENLIVE) (ENSURE ENLIVE) liquid 237 mL, 237 mL, Oral, TID BM, Domenic Polite, MD, 237 mL at 06/27/19 1001 .  fluticasone furoate-vilanterol (BREO ELLIPTA) 100-25 MCG/INH 1 puff, 1 puff, Inhalation, q morning - 10a, 1  puff at 06/27/19 1126 **AND** umeclidinium bromide (INCRUSE ELLIPTA) 62.5 MCG/INH 1 puff, 1 puff, Inhalation, q morning - 10a, Hall, Scott A, RPH, 1 puff at 06/27/19 1006 .  HYDROcodone-acetaminophen (NORCO/VICODIN) 5-325 MG per tablet 1-2 tablet, 1-2 tablet, Oral, Q4H PRN, Doutova, Anastassia, MD .  lidocaine (PF) (XYLOCAINE) 1 % injection 30 mL, 30 mL, Infiltration, Once, Amorina Doerr, MD .  lidocaine (XYLOCAINE) 2 % jelly 1 application, 1 application, Topical, Once, Jeremie Abdelaziz, MD .  multivitamin with minerals tablet 1 tablet, 1 tablet, Oral, Daily, Domenic Polite, MD, 1 tablet at 06/27/19 1005 .   ondansetron (ZOFRAN) tablet 4 mg, 4 mg, Oral, Q6H PRN **OR** ondansetron (ZOFRAN) injection 4 mg, 4 mg, Intravenous, Q6H PRN, Doutova, Anastassia, MD .  phenylephrine (NEO-SYNEPHRINE) 0.25 % nasal spray 1 spray, 1 spray, Each Nare, Q6H PRN, Lanney Gins, Audy Dauphine, MD .  phosphorus (K PHOS NEUTRAL) tablet 500 mg, 500 mg, Oral, Once, Domenic Polite, MD .  sodium chloride flush (NS) 0.9 % injection 10-40 mL, 10-40 mL, Intracatheter, Q12H, Domenic Polite, MD, 10 mL at 06/27/19 1002 .  sodium chloride flush (NS) 0.9 % injection 10-40 mL, 10-40 mL, Intracatheter, PRN, Domenic Polite, MD    ALLERGIES   Patient has no known allergies.     REVIEW OF SYSTEMS    Review of Systems:  Gen:  Denies  fever, sweats, chills weigh loss  HEENT: Denies blurred vision, double vision, ear pain, eye pain, hearing loss, nose bleeds, sore throat Cardiac:  No dizziness, chest pain or heaviness, chest tightness,edema Resp:   Denies cough or sputum porduction, shortness of breath,wheezing, hemoptysis,  Gi: Denies swallowing difficulty, stomach pain, nausea or vomiting, diarrhea, constipation, bowel incontinence Gu:  Denies bladder incontinence, burning urine Ext:   Denies Joint pain, stiffness or swelling Skin: Denies  skin rash, easy bruising or bleeding or hives Endoc:  Denies polyuria, polydipsia , polyphagia or weight change Psych:   Denies depression, insomnia or hallucinations   Other:  All other systems negative   VS: BP 111/65 (BP Location: Left Arm)   Pulse 79   Temp 98 F (36.7 C) (Oral)   Resp 16   Ht 5\' 8"  (1.727 m)   Wt 41.8 kg   SpO2 100%   BMI 14.01 kg/m      PHYSICAL EXAM    GENERAL:NAD, no fevers, chills, no weakness no fatigue HEAD: Normocephalic, atraumatic.  EYES: Pupils equal, round, reactive to light. Extraocular muscles intact. No scleral icterus.  MOUTH: Moist mucosal membrane. Dentition intact. No abscess noted.  EAR, NOSE, THROAT: Clear without exudates. No external  lesions.  NECK: Supple. No thyromegaly. No nodules. No JVD.  PULMONARY: Diffuse coarse rhonchi right sided +wheezes CARDIOVASCULAR: S1 and S2. Regular rate and rhythm. No murmurs, rubs, or gallops. No edema. Pedal pulses 2+ bilaterally.  GASTROINTESTINAL: Soft, nontender, nondistended. No masses. Positive bowel sounds. No hepatosplenomegaly.  MUSCULOSKELETAL: No swelling, clubbing, or edema. Range of motion full in all extremities.  NEUROLOGIC: Cranial nerves II through XII are intact. No gross focal neurological deficits. Sensation intact. Reflexes intact.  SKIN: No ulceration, lesions, rashes, or cyanosis. Skin warm and dry. Turgor intact.  PSYCHIATRIC: Mood, affect within normal limits. The patient is awake, alert and oriented x 3. Insight, judgment intact.       IMAGING    DG Chest 2 View  Result Date: 06/17/2019 CLINICAL DATA:  Dizziness and shortness of breath. EXAM: CHEST - 2 VIEW COMPARISON:  05/23/2019. FINDINGS: Similar appearance of volume loss in the  left hemithorax with elevation of the left hemidiaphragm and left base collapse/consolidative opacity. There is diffuse interstitial and pleural opacity in the left chest. Right lung is hyperexpanded without focal consolidation or pleural effusion. Bones are diffusely demineralized. IMPRESSION: Largely stable exam. Marked volume loss left hemithorax with posterior collapse/consolidative change in diffuse pleural thickening. Electronically Signed   By: Misty Stanley M.D.   On: 06/17/2019 16:43   CT Angio Chest PE W/Cm &/Or Wo Cm  Result Date: 06/17/2019 CLINICAL DATA:  Shortness of breath EXAM: CT ANGIOGRAPHY CHEST WITH CONTRAST TECHNIQUE: Multidetector CT imaging of the chest was performed using the standard protocol during bolus administration of intravenous contrast. Multiplanar CT image reconstructions and MIPs were obtained to evaluate the vascular anatomy. CONTRAST:  34mL OMNIPAQUE IOHEXOL 350 MG/ML SOLN COMPARISON:  03/06/2019  FINDINGS: Cardiovascular: No filling defects in the pulmonary arteries to suggest pulmonary emboli. Heart is normal size. Aorta normal caliber. Aortic and coronary artery atherosclerosis. Mediastinum/Nodes: There is pneumomediastinum. Gas seen around the main pulmonary artery and in the posterior mediastinum. No adenopathy. Lungs/Pleura: Volume loss on the left with elevation of the left hemidiaphragm. Findings are chronic. There is a chronic masslike area of consolidation in the left lower lobe which is similar to prior study. Advanced centrilobular emphysema and areas of scarring in both lungs. Possible small left effusion, stable. Upper Abdomen: Imaging into the upper abdomen shows no acute findings. Musculoskeletal: Cachexia.  No acute bony abnormality. Review of the MIP images confirms the above findings. IMPRESSION: Pneumomediastinum seen in the middle and posterior mediastinum. No associated pneumothorax. Advanced centrilobular emphysema. Stable chronic volume loss on the left with elevation of the left hemidiaphragm and masslike consolidation in the left lower lobe. As recommended on prior study, bronchoscopy with attention to the left lower lobe and/or PET CT may be helpful to exclude left lower lobe mass lesion. Small right pleural effusion. Areas of scarring in the lungs bilaterally. Aortic Atherosclerosis (ICD10-I70.0) and Emphysema (ICD10-J43.9). Electronically Signed   By: Rolm Baptise M.D.   On: 06/17/2019 21:12   CT Abdomen Pelvis W Contrast  Result Date: 06/17/2019 CLINICAL DATA:  Hypercalcemia. EXAM: CT ABDOMEN AND PELVIS WITH CONTRAST TECHNIQUE: Multidetector CT imaging of the abdomen and pelvis was performed using the standard protocol following bolus administration of intravenous contrast. CONTRAST:  69mL OMNIPAQUE IOHEXOL 350 MG/ML SOLN COMPARISON:  None. FINDINGS: Lower chest: Masslike opacity seen in the left lower lobe as described on prior chest CT. Trace left pleural effusion. Volume  loss on the left with elevation of the left hemidiaphragm. Advanced emphysema. Scarring in the right lower lobe. Hepatobiliary: No focal hepatic abnormality. Gallbladder unremarkable. Pancreas: Calcifications throughout the pancreas compatible with chronic pancreatitis. No evidence of acute pancreatitis, fall suspicious pancreatic lesion or pancreatic ductal dilatation. Spleen: No focal abnormality.  Normal size. Adrenals/Urinary Tract: Small cysts in the left kidney. No hydronephrosis. Adrenal glands and urinary bladder unremarkable. Stomach/Bowel: Large stool burden throughout the colon. Distention of the rectum with stool concerning for fecal impaction. Stomach and small bowel decompressed, unremarkable. Vascular/Lymphatic: Aortic atherosclerosis. No enlarged abdominal or pelvic lymph nodes. Reproductive: Prostate prominence. Other: No free fluid or free air. Musculoskeletal: Cachexia. No acute bony abnormality. Degenerative disc and facet disease in the lower lumbar spine. IMPRESSION: Masslike opacity in the left lower lobe as described on chest CT concerning for possible pulmonary mass lesions/malignancy. This could be further evaluated with bronchoscopy and/or PET CT as described on prior chest CT. Advanced emphysema. Large stool burden throughout the colon.  Appearance is concerning for fecal impaction. Chronic pancreatitis changes.  No evidence of acute pancreatitis. Aortic atherosclerosis. Electronically Signed   By: Rolm Baptise M.D.   On: 06/17/2019 21:16   DG Chest Port 1 View  Result Date: 06/25/2019 CLINICAL DATA:  Weakness, COVID-19 positive EXAM: PORTABLE CHEST 1 VIEW COMPARISON:  06/17/2019 FINDINGS: Chronic volume loss with consolidation in the left hemithorax, stable from prior. There may be mildly increased opacity within the aerated portion of the left lung. Heart size is stable. Mediastinal structures remain shifted to the left. There is hyperexpansion of the right lung field with chronic  emphysematous changes and right apical scarring. No pneumothorax. IMPRESSION: Chronic volume loss with consolidation in the left hemithorax, stable from prior. There may be mildly increased opacity within the aerated portion of the left lung, which could reflect pneumonia given the patient's history. Electronically Signed   By: Davina Poke D.O.   On: 06/25/2019 13:31   ECHOCARDIOGRAM COMPLETE  Result Date: 06/26/2019   ECHOCARDIOGRAM REPORT   Patient Name:   Corey Carr Date of Exam: 06/26/2019 Medical Rec #:  [id number]       Height:       68.0 in Accession #:    [id number]      Weight:       92.2 lb Date of Birth:  1940/07/18       BSA:          1.47 m Patient Age:    34 years        BP:           89/66 mmHg Patient Gender: M               HR:           129 bpm. Exam Location:  [facility] Procedure: 2D Echo Indications:     ATRIAL FIBRILLATION 427.31/I48.91  History:         Patient has no prior history of Echocardiogram examinations.                  COPD; Risk Factors:Hypertension.  Sonographer:     Avanell Shackleton Referring Phys:  [place] Diagnosing Phys: Ida Rogue MD  Sonographer Comments: Image acquisition challenging due to patient body habitus. IMPRESSIONS  1. Left ventricular ejection fraction, by visual estimation, is 50 to 55%. The left ventricle has normal function. There is moderately increased left ventricular hypertrophy.  2. Left ventricular diastolic parameters are indeterminate.  3. The left ventricle has no regional wall motion abnormalities.  4. Global right ventricle has normal systolic function.The right ventricular size is normal. No increase in right ventricular wall thickness.  5. Left atrial size was normal.  6. Tricuspid valve regurgitation is mild-moderate.  7. Mildly elevated pulmonary artery systolic pressure.  8. Tachycardia noted/atrial flutter, rate 130 bpm FINDINGS  Left Ventricle: Left ventricular ejection fraction, by visual estimation, is 50 to 55%. The left  ventricle has normal function. The left ventricle has no regional wall motion abnormalities. There is moderately increased left ventricular hypertrophy. Left ventricular diastolic parameters are indeterminate. Normal left atrial pressure. Right Ventricle: The right ventricular size is normal. No increase in right ventricular wall thickness. Global RV systolic function is has normal systolic function. The tricuspid regurgitant velocity is 2.90 m/s, and with an assumed right atrial pressure  of 5 mmHg, the estimated right ventricular systolic pressure is mildly elevated at 38.6 mmHg. Left Atrium: Left atrial size was normal in size. Right Atrium:  Right atrial size was normal in size Pericardium: There is no evidence of pericardial effusion. Mitral Valve: The mitral valve is normal in structure. Mild to moderate mitral valve regurgitation. No evidence of mitral valve stenosis by observation. Tricuspid Valve: The tricuspid valve is normal in structure. Tricuspid valve regurgitation is mild-moderate. Aortic Valve: The aortic valve is normal in structure. Aortic valve regurgitation is not visualized. Mild to moderate aortic valve sclerosis/calcification is present, without any evidence of aortic stenosis. Pulmonic Valve: The pulmonic valve was normal in structure. Pulmonic valve regurgitation is not visualized. Pulmonic regurgitation is not visualized. Aorta: The aortic root, ascending aorta and aortic arch are all structurally normal, with no evidence of dilitation or obstruction. Venous: The inferior vena cava is normal in size with greater than 50% respiratory variability, suggesting right atrial pressure of 3 mmHg. IAS/Shunts: No atrial level shunt detected by color flow Doppler. There is no evidence of a patent foramen ovale. No ventricular septal defect is seen or detected. There is no evidence of an atrial septal defect.  LEFT VENTRICLE PLAX 2D LVIDd:         2.60 cm LVIDs:         2.26 cm LV PW:         0.68 cm LV  IVS:        0.92 cm LVOT diam:     1.70 cm LV SV:         7 ml LV SV Index:   5.30 LVOT Area:     2.27 cm  LEFT ATRIUM             Index       RIGHT ATRIUM           Index LA diam:        2.70 cm 1.84 cm/m  RA Area:     16.00 cm LA Vol (A2C):   58.4 ml 39.72 ml/m RA Volume:   45.70 ml  31.08 ml/m LA Vol (A4C):   33.6 ml 22.85 ml/m LA Biplane Vol: 48.1 ml 32.71 ml/m   AORTA Ao Root diam: 3.30 cm TRICUSPID VALVE TR Peak grad:   33.6 mmHg TR Vmax:        305.00 cm/s  SHUNTS Systemic Diam: 1.70 cm  Ida Rogue MD Electronically signed by Ida Rogue MD Signature Date/Time: 06/26/2019/7:28:13 PM    Final    [place] EKG SITE RITE  Result Date: 06/26/2019 If Site Rite image not attached, placement could not be confirmed due to current cardiac rhythm.     ASSESSMENT/PLAN   Left lower lobe lung mass with hilar/mediastinal lymphadenopathy  -Discussed with oncology-patient would benefit from tissue diagnosis  -Patient wishes to pursue fully aggressive measures at this time -We had discussed plans for biopsy and patient wants to proceed -Discussed benefits as well as risks of procedure.  Benefits include accurate tissue diagnosis and staging as well as ability to formulate plan for treatment.  Risks of procedure include but are not limited to pneumothorax/pneumomediastinum which may require chest tube and mechanical ventilation of prolonged ICU stay, bleeding, infection and very rarely death.  Patient understands risks and wishes to proceed with planned bronchoscopy/ENB/EBUS   Advanced COPD  -Typical COPD care path  -DuoNebs every 6 hours while awake  -Incentive spirometer every hour while awake  -Flutter valve to help expectorate thickened phlegm  -No need for IV steroids at this time       Thank you Dr. Broadus John for allowing me to participate  in the care of this patient.   Patient/Family are satisfied with care plan and all questions have been answered.   This document was prepared using  Dragon voice recognition software and may include unintentional dictation errors.     Ottie Glazier, M.D.  [facility] [place]

## 2019-06-27 NOTE — Progress Notes (Signed)
PROGRESS NOTE    JACOBO MONCRIEF  RSW:[id number] DOB: December 02, 1940 DOA: 06/25/2019 PCP: [name], MD  Brief Narrative: FLAVIO LINDROTH is a 79 y.o. male with medical history significant for COPD, recent Covid pneumonia, severe malnutrition, hypertension, ongoing work-up for lung mass suspicious for lung cancer. -On 2/3 he was getting a PET scan, during this time he was noted to be hypoglycemic with CBG of 19, subsequently found to be weak and he was transported to the emergency room . -Prior to this he was hospitalized few weeks ago with hypercalcemia, dehydration and acute kidney injury, treated with bisphosphonates, hydration etc. subsequently set up to see oncology in the office for evaluation of lung mass. -Prior to that he was hospitalized in December with Covid pneumonia and was treated with remdesivir and steroids   Assessment & Plan:   Hypoglycemia -Likely secondary to infection, postobstructive pneumonia, malnutrition, ongoing weight loss, likely very low glycogen stores -Attempt to wean off dextrose infusion, add Ensure between meals  -Follow-up hemoglobin A1c  Postobstructive pneumonia -Versus healthcare associated pneumonia -Continue azithromycin, continue cefepime, day 3 of antibiotics -Also check SLP evaluation to rule out aspiration -Wean O2 as tolerated  Lung mass highly suspicious for malignancy -Follow-up with oncology, was unable to complete his PET scan, given his overall condition I doubt he would be a candidate for aggressive cancer treatment -Needs goals of care discussions  Atrial fibrillation with rapid ventricular response -Greatly appreciate cardiology assistance, converted to sinus rhythm -Not a great candidate for anticoagulation given lung cancer, severe cachexia -Soft blood pressure also limits rate control agents, -Plan to continue po amiodarone for now -TSH is high but free T4 is normal, will need thyroid studies repeated in 6 weeks -Transfer out  of ICU  Recent hypercalcemia likely from malignancy -Improved, recently treated with Zometa, fluids etc.  COPD -Stable, no wheezing at this time  Severe protein calorie malnutrition -Add supplements as tolerated   DVT prophylaxis: Heparin subcutaneous Code Status: Discussed CODE STATUS with patient and wife and, they are agreeable to DNR Family Communication: No family at bedside, called and updated wife Orlyn Odonoghue 2/4 Disposition Plan: Home pending clinical improvement, possibly 48 hours  Consultants:   Cardiology   Procedures:   Antimicrobials:    Subjective: -Converted to sinus rhythm  Objective: Vitals:   06/27/19 0300 06/27/19 0400 06/27/19 0500 06/27/19 0600  BP: (!) 96/57 (!) 98/54 94/62 (!) 94/56  Pulse: 69 69 65 66  Resp: (!) 21 19 18 20   Temp:  98.2 F (36.8 C)    TempSrc:  Oral    SpO2: 100% 100% 100% 97%  Weight:      Height:        Intake/Output Summary (Last 24 hours) at 06/27/2019 1140 Last data filed at 06/27/2019 0300 Gross per 24 hour  Intake 1390.48 ml  Output 551 ml  Net 839.48 ml   Filed Weights   06/25/19 1248 06/26/19 0000 06/26/19 1400  Weight: 43.1 kg 39.6 kg 41.8 kg    Examination:  Gen: Extremely cachectic male, appears older than stated age, AAOx3, no distress HEENT: No JVD Lungs: Poor air movement bilaterally CVS: S1-S2, regular rate rhythm  abd: soft, Non tender, non distended, BS present Extremities: No edema  skin: no new rashes Psychiatry: Mood & affect appropriate.     Data Reviewed:   CBC: Recent Labs  Lab 06/24/19 1030 06/25/19 1245 06/26/19 0404 06/27/19 0356  WBC 15.8* 19.0* 14.8* 13.8*  NEUTROABS 12.1*  --   --   --  HGB 10.3* 10.1* 9.0* 7.6*  HCT 31.9* 30.0* 26.8* 22.6*  MCV 82.0 80.2 81.0 81.0  PLT 643* 719* 615* 176*   Basic Metabolic Panel: Recent Labs  Lab 06/24/19 1030 06/25/19 1245 06/26/19 0404 06/27/19 0356  NA 137 136 136 139  K 3.9 4.1 3.9 3.4*  CL 104 103 106 110  CO2 20* 22  21* 21*  GLUCOSE 97 195* 104* 105*  BUN 21 31* 25* 14  CREATININE 0.98 1.12 0.84 0.68  CALCIUM 9.1 8.6* 8.0* 7.2*  MG  --   --  1.7  --   PHOS  --   --  1.1*  --    GFR: Estimated Creatinine Clearance: 45 mL/min (by C-G formula based on SCr of 0.68 mg/dL). Liver Function Tests: Recent Labs  Lab 06/24/19 1030 06/25/19 1245 06/26/19 0404  AST 18 19 22   ALT 17 17 20   ALKPHOS 85 82 68  BILITOT 0.8 0.7 1.0  PROT 6.9 6.9 5.9*  ALBUMIN 3.1* 3.0* 2.6*   No results for input(s): LIPASE, AMYLASE in the last 168 hours. No results for input(s): AMMONIA in the last 168 hours. Coagulation Profile: No results for input(s): INR, PROTIME in the last 168 hours. Cardiac Enzymes: No results for input(s): CKTOTAL, CKMB, CKMBINDEX, TROPONINI in the last 168 hours. BNP (last 3 results) No results for input(s): PROBNP in the last 8760 hours. HbA1C: Recent Labs    06/26/19 0404  HGBA1C 5.6   CBG: Recent Labs  Lab 06/27/19 0154 06/27/19 0405 06/27/19 0610 06/27/19 0719 06/27/19 1128  GLUCAP 104* 101* 101* 91 88   Lipid Profile: No results for input(s): CHOL, HDL, LDLCALC, TRIG, CHOLHDL, LDLDIRECT in the last 72 hours. Thyroid Function Tests: Recent Labs    06/27/19 0356  TSH 4.957*  FREET4 1.08   Anemia Panel: No results for input(s): VITAMINB12, FOLATE, FERRITIN, TIBC, IRON, RETICCTPCT in the last 72 hours. Urine analysis:    Component Value Date/Time   COLORURINE AMBER (A) 06/25/2019 1704   APPEARANCEUR HAZY (A) 06/25/2019 1704   APPEARANCEUR Clear 11/14/2016 1538   LABSPEC 1.024 06/25/2019 1704   PHURINE 5.0 06/25/2019 1704   GLUCOSEU 50 (A) 06/25/2019 1704   HGBUR NEGATIVE 06/25/2019 1704   BILIRUBINUR NEGATIVE 06/25/2019 1704   BILIRUBINUR Negative 11/14/2016 1538   KETONESUR NEGATIVE 06/25/2019 1704   PROTEINUR 30 (A) 06/25/2019 1704   NITRITE NEGATIVE 06/25/2019 1704   LEUKOCYTESUR NEGATIVE 06/25/2019 1704   Sepsis  Labs: @LABRCNTIP (procalcitonin:4,lacticidven:4)  ) Recent Results (from the past 240 hour(s))  SARS CORONAVIRUS 2 (TAT 6-24 HRS) Nasopharyngeal Nasopharyngeal Swab     Status: None   Collection Time: 06/17/19  9:57 PM   Specimen: Nasopharyngeal Swab  Result Value Ref Range Status   SARS Coronavirus 2 NEGATIVE NEGATIVE Final    Comment: (NOTE) SARS-CoV-2 target nucleic acids are NOT DETECTED. The SARS-CoV-2 RNA is generally detectable in upper and lower respiratory specimens during the acute phase of infection. Negative results do not preclude SARS-CoV-2 infection, do not rule out co-infections with other pathogens, and should not be used as the sole basis for treatment or other patient management decisions. Negative results must be combined with clinical observations, patient history, and epidemiological information. The expected result is Negative. Fact Sheet for Patients: SugarRoll.be Fact Sheet for Healthcare Providers: https://www.woods-mathews.com/ This test is not yet approved or cleared by the [place] FDA and  has been authorized for detection and/or diagnosis of SARS-CoV-2 by FDA under an Emergency Use Authorization (EUA). This EUA will  remain  in effect (meaning this test can be used) for the duration of the COVID-19 declaration under Section 56 4(b)(1) of the Act, 21 U.S.C. section 360bbb-3(b)(1), unless the authorization is terminated or revoked sooner. Performed at [facility], [place] [address] [place], [place] [address]   Blood culture (routine x 2)     Status: None (Preliminary result)   Collection Time: 06/25/19  8:58 PM   Specimen: BLOOD  Result Value Ref Range Status   Specimen Description BLOOD LEFT ASSIST CONTROL  Final   Special Requests   Final    BOTTLES DRAWN AEROBIC AND ANAEROBIC Blood Culture adequate volume   Culture   Final    NO GROWTH 2 DAYS Performed at [facility], [address], [place], [place] [address]    Report Status PENDING  Incomplete  Blood culture (routine x 2)     Status: None (Preliminary result)   Collection Time: 06/25/19  8:58 PM   Specimen: BLOOD  Result Value Ref Range Status   Specimen Description BLOOD LEFT ASSIST CONTROL  Final   Special Requests   Final    BOTTLES DRAWN AEROBIC AND ANAEROBIC Blood Culture results may not be optimal due to an excessive volume of blood received in culture bottles   Culture   Final    NO GROWTH 2 DAYS Performed at [facility], [address], [place], [place] [address]    Report Status PENDING  Incomplete  MRSA PCR Screening     Status: None   Collection Time: 06/26/19  2:06 PM   Specimen: Nasal Mucosa; Nasopharyngeal  Result Value Ref Range Status   MRSA by PCR NEGATIVE NEGATIVE Final    Comment:        The GeneXpert MRSA Assay (FDA approved for NASAL specimens only), is one component of a comprehensive MRSA colonization surveillance program. It is not intended to diagnose MRSA infection nor to guide or monitor treatment for MRSA infections. Performed at [facility], [address], [place], [place] [address]          Radiology Studies: DG Chest [place] 1 View  Result Date: 06/25/2019 CLINICAL DATA:  Weakness, COVID-19 positive EXAM: PORTABLE CHEST 1 VIEW COMPARISON:  06/17/2019 FINDINGS: Chronic volume loss with consolidation in the left hemithorax, stable from prior. There may be mildly increased opacity within the aerated portion of the left lung. Heart size is stable. Mediastinal structures remain shifted to the left. There is hyperexpansion of the right lung field with chronic emphysematous changes and right apical scarring. No pneumothorax. IMPRESSION: Chronic volume loss with consolidation in the left hemithorax, stable from prior. There may be mildly increased opacity within the aerated portion of the left lung, which could reflect pneumonia given the patient's history.  Electronically Signed   By: Davina Poke D.O.   On: 06/25/2019 13:31   ECHOCARDIOGRAM COMPLETE  Result Date: 06/26/2019   ECHOCARDIOGRAM REPORT   Patient Name:   LENNART GLADISH Date of Exam: 06/26/2019 Medical Rec #:  [id number]       Height:       68.0 in Accession #:    [id number]      Weight:       92.2 lb Date of Birth:  12-Apr-1941       BSA:          1.47 m Patient Age:    81 years        BP:  89/66 mmHg Patient Gender: M               HR:           129 bpm. Exam Location:  [facility] Procedure: 2D Echo Indications:     ATRIAL FIBRILLATION 427.31/I48.91  History:         Patient has no prior history of Echocardiogram examinations.                  COPD; Risk Factors:Hypertension.  Sonographer:     Avanell Shackleton Referring Phys:  [place] Diagnosing Phys: Ida Rogue MD  Sonographer Comments: Image acquisition challenging due to patient body habitus. IMPRESSIONS  1. Left ventricular ejection fraction, by visual estimation, is 50 to 55%. The left ventricle has normal function. There is moderately increased left ventricular hypertrophy.  2. Left ventricular diastolic parameters are indeterminate.  3. The left ventricle has no regional wall motion abnormalities.  4. Global right ventricle has normal systolic function.The right ventricular size is normal. No increase in right ventricular wall thickness.  5. Left atrial size was normal.  6. Tricuspid valve regurgitation is mild-moderate.  7. Mildly elevated pulmonary artery systolic pressure.  8. Tachycardia noted/atrial flutter, rate 130 bpm FINDINGS  Left Ventricle: Left ventricular ejection fraction, by visual estimation, is 50 to 55%. The left ventricle has normal function. The left ventricle has no regional wall motion abnormalities. There is moderately increased left ventricular hypertrophy. Left ventricular diastolic parameters are indeterminate. Normal left atrial pressure. Right Ventricle: The right ventricular size is normal. No  increase in right ventricular wall thickness. Global RV systolic function is has normal systolic function. The tricuspid regurgitant velocity is 2.90 m/s, and with an assumed right atrial pressure  of 5 mmHg, the estimated right ventricular systolic pressure is mildly elevated at 38.6 mmHg. Left Atrium: Left atrial size was normal in size. Right Atrium: Right atrial size was normal in size Pericardium: There is no evidence of pericardial effusion. Mitral Valve: The mitral valve is normal in structure. Mild to moderate mitral valve regurgitation. No evidence of mitral valve stenosis by observation. Tricuspid Valve: The tricuspid valve is normal in structure. Tricuspid valve regurgitation is mild-moderate. Aortic Valve: The aortic valve is normal in structure. Aortic valve regurgitation is not visualized. Mild to moderate aortic valve sclerosis/calcification is present, without any evidence of aortic stenosis. Pulmonic Valve: The pulmonic valve was normal in structure. Pulmonic valve regurgitation is not visualized. Pulmonic regurgitation is not visualized. Aorta: The aortic root, ascending aorta and aortic arch are all structurally normal, with no evidence of dilitation or obstruction. Venous: The inferior vena cava is normal in size with greater than 50% respiratory variability, suggesting right atrial pressure of 3 mmHg. IAS/Shunts: No atrial level shunt detected by color flow Doppler. There is no evidence of a patent foramen ovale. No ventricular septal defect is seen or detected. There is no evidence of an atrial septal defect.  LEFT VENTRICLE PLAX 2D LVIDd:         2.60 cm LVIDs:         2.26 cm LV PW:         0.68 cm LV IVS:        0.92 cm LVOT diam:     1.70 cm LV SV:         7 ml LV SV Index:   5.30 LVOT Area:     2.27 cm  LEFT ATRIUM  Index       RIGHT ATRIUM           Index LA diam:        2.70 cm 1.84 cm/m  RA Area:     16.00 cm LA Vol (A2C):   58.4 ml 39.72 ml/m RA Volume:   45.70 ml  31.08  ml/m LA Vol (A4C):   33.6 ml 22.85 ml/m LA Biplane Vol: 48.1 ml 32.71 ml/m   AORTA Ao Root diam: 3.30 cm TRICUSPID VALVE TR Peak grad:   33.6 mmHg TR Vmax:        305.00 cm/s  SHUNTS Systemic Diam: 1.70 cm  Ida Rogue MD Electronically signed by Ida Rogue MD Signature Date/Time: 06/26/2019/7:28:13 PM    Final    [place] EKG SITE RITE  Result Date: 06/26/2019 If Site Rite image not attached, placement could not be confirmed due to current cardiac rhythm.       Scheduled Meds: . amiodarone  400 mg Oral BID  . Chlorhexidine Gluconate Cloth  6 each Topical Daily  . feeding supplement (ENSURE ENLIVE)  237 mL Oral TID BM  . fluticasone furoate-vilanterol  1 puff Inhalation q morning - 10a   And  . umeclidinium bromide  1 puff Inhalation q morning - 10a  . multivitamin with minerals  1 tablet Oral Daily  . sodium chloride flush  10-40 mL Intracatheter Q12H   Continuous Infusions: . azithromycin 500 mg (06/26/19 2251)  . ceFEPime (MAXIPIME) IV 2 g (06/27/19 1123)  . dextrose 5 % and 0.45% NaCl 50 mL/hr at 06/27/19 1002     LOS: 1 day    Time spent: 12min    Domenic Polite, MD [facility]  06/27/2019, 11:40 AM

## 2019-06-27 NOTE — Progress Notes (Signed)
Progress Note  Patient Name: Corey Carr Date of Encounter: 06/27/2019  Primary Cardiologist: [facility] Servais  Subjective   Reports feeling well No complaints, not very hungry this morning, had very small amount of his breakfast Atrial flutter with rapid rate 150 bpm yesterday Given amiodarone 400 mg p.o. x2 doses, He had poor IV access Telemetry reviewed, converting to normal sinus rhythm 10 PM Maintaining normal sinus rhythm overnight and this morning  Inpatient Medications    Scheduled Meds:  amiodarone  400 mg Oral BID   Chlorhexidine Gluconate Cloth  6 each Topical Daily   feeding supplement (ENSURE ENLIVE)  237 mL Oral TID BM   fluticasone furoate-vilanterol  1 puff Inhalation q morning - 10a   And   umeclidinium bromide  1 puff Inhalation q morning - 10a   multivitamin with minerals  1 tablet Oral Daily   phosphorus  500 mg Oral Once   sodium chloride flush  10-40 mL Intracatheter Q12H   Continuous Infusions:  azithromycin 500 mg (06/26/19 2251)   ceFEPime (MAXIPIME) IV 2 g (06/27/19 1123)   dextrose 5 % and 0.45% NaCl 50 mL/hr at 06/27/19 1002   PRN Meds: acetaminophen **OR** acetaminophen, albuterol, HYDROcodone-acetaminophen, ondansetron **OR** ondansetron (ZOFRAN) IV, sodium chloride flush   Vital Signs    Vitals:   06/27/19 0300 06/27/19 0400 06/27/19 0500 06/27/19 0600  BP: (!) 96/57 (!) 98/54 94/62 (!) 94/56  Pulse: 69 69 65 66  Resp: (!) 21 19 18 20   Temp:  98.2 F (36.8 C)    TempSrc:  Oral    SpO2: 100% 100% 100% 97%  Weight:      Height:        Intake/Output Summary (Last 24 hours) at 06/27/2019 1314 Last data filed at 06/27/2019 0300 Gross per 24 hour  Intake 1390.48 ml  Output 551 ml  Net 839.48 ml   Last 3 Weights 06/26/2019 06/26/2019 06/25/2019  Weight (lbs) 92 lb 2.4 oz 87 lb 4.8 oz 95 lb  Weight (kg) 41.8 kg 39.6 kg 43.092 kg      Telemetry    Atrial flutter with RVR rate 150 converting to normal sinus rhythm-10 PM  personally Reviewed  ECG    - Personally Reviewed  Physical Exam   GEN: No acute distress.  Cachectic Neck: No JVD Cardiac: RRR, no murmurs, rubs, or gallops.  Respiratory: Coarse breath sounds bilaterally GI: Soft, nontender, non-distended  MS: No edema; No deformity.  Muscle atrophy noted Neuro:  Nonfocal  Psych: Normal affect   Labs    High Sensitivity Troponin:   Recent Labs  Lab 06/17/19 1511  TROPONINIHS 29*      Chemistry Recent Labs  Lab 06/24/19 1030 06/24/19 1030 06/25/19 1245 06/26/19 0404 06/27/19 0356  NA 137   < > 136 136 139  K 3.9   < > 4.1 3.9 3.4*  CL 104   < > 103 106 110  CO2 20*   < > 22 21* 21*  GLUCOSE 97   < > 195* 104* 105*  BUN 21   < > 31* 25* 14  CREATININE 0.98   < > 1.12 0.84 0.68  CALCIUM 9.1   < > 8.6* 8.0* 7.2*  PROT 6.9  --  6.9 5.9*  --   ALBUMIN 3.1*  --  3.0* 2.6*  --   AST 18  --  19 22  --   ALT 17  --  17 20  --   ALKPHOS 85  --  82 68  --   BILITOT 0.8  --  0.7 1.0  --   GFRNONAA >60   < > >60 >60 >60  GFRAA >60   < > >60 >60 >60  ANIONGAP 13   < > 11 9 8    < > = values in this interval not displayed.     Hematology Recent Labs  Lab 06/25/19 1245 06/26/19 0404 06/27/19 0356  WBC 19.0* 14.8* 13.8*  RBC 3.74* 3.31* 2.79*  HGB 10.1* 9.0* 7.6*  HCT 30.0* 26.8* 22.6*  MCV 80.2 81.0 81.0  MCH 27.0 27.2 27.2  MCHC 33.7 33.6 33.6  RDW 18.3* 17.7* 18.0*  PLT 719* 615* 521*    BNPNo results for input(s): BNP, PROBNP in the last 168 hours.   DDimer No results for input(s): DDIMER in the last 168 hours.   Radiology    DG Chest Port 1 View  Result Date: 06/25/2019 CLINICAL DATA:  Weakness, COVID-19 positive EXAM: PORTABLE CHEST 1 VIEW COMPARISON:  06/17/2019 FINDINGS: Chronic volume loss with consolidation in the left hemithorax, stable from prior. There may be mildly increased opacity within the aerated portion of the left lung. Heart size is stable. Mediastinal structures remain shifted to the left. There is  hyperexpansion of the right lung field with chronic emphysematous changes and right apical scarring. No pneumothorax. IMPRESSION: Chronic volume loss with consolidation in the left hemithorax, stable from prior. There may be mildly increased opacity within the aerated portion of the left lung, which could reflect pneumonia given the patient's history. Electronically Signed   By: Davina Poke D.O.   On: 06/25/2019 13:31   ECHOCARDIOGRAM COMPLETE  Result Date: 06/26/2019   ECHOCARDIOGRAM REPORT   Patient Name:   Corey Carr Date of Exam: 06/26/2019 Medical Rec #:  [id number]       Height:       68.0 in Accession #:    [id number]      Weight:       92.2 lb Date of Birth:  02-28-1941       BSA:          1.47 m Patient Age:    63 years        BP:           89/66 mmHg Patient Gender: M               HR:           129 bpm. Exam Location:  [facility] Procedure: 2D Echo Indications:     ATRIAL FIBRILLATION 427.31/I48.91  History:         Patient has no prior history of Echocardiogram examinations.                  COPD; Risk Factors:Hypertension.  Sonographer:     Avanell Shackleton Referring Phys:  [place] Diagnosing Phys: Ida Rogue MD  Sonographer Comments: Image acquisition challenging due to patient body habitus. IMPRESSIONS  1. Left ventricular ejection fraction, by visual estimation, is 50 to 55%. The left ventricle has normal function. There is moderately increased left ventricular hypertrophy.  2. Left ventricular diastolic parameters are indeterminate.  3. The left ventricle has no regional wall motion abnormalities.  4. Global right ventricle has normal systolic function.The right ventricular size is normal. No increase in right ventricular wall thickness.  5. Left atrial size was normal.  6. Tricuspid valve regurgitation is mild-moderate.  7. Mildly elevated pulmonary artery systolic pressure.  8. Tachycardia noted/atrial  flutter, rate 130 bpm FINDINGS  Left Ventricle: Left ventricular ejection  fraction, by visual estimation, is 50 to 55%. The left ventricle has normal function. The left ventricle has no regional wall motion abnormalities. There is moderately increased left ventricular hypertrophy. Left ventricular diastolic parameters are indeterminate. Normal left atrial pressure. Right Ventricle: The right ventricular size is normal. No increase in right ventricular wall thickness. Global RV systolic function is has normal systolic function. The tricuspid regurgitant velocity is 2.90 m/s, and with an assumed right atrial pressure  of 5 mmHg, the estimated right ventricular systolic pressure is mildly elevated at 38.6 mmHg. Left Atrium: Left atrial size was normal in size. Right Atrium: Right atrial size was normal in size Pericardium: There is no evidence of pericardial effusion. Mitral Valve: The mitral valve is normal in structure. Mild to moderate mitral valve regurgitation. No evidence of mitral valve stenosis by observation. Tricuspid Valve: The tricuspid valve is normal in structure. Tricuspid valve regurgitation is mild-moderate. Aortic Valve: The aortic valve is normal in structure. Aortic valve regurgitation is not visualized. Mild to moderate aortic valve sclerosis/calcification is present, without any evidence of aortic stenosis. Pulmonic Valve: The pulmonic valve was normal in structure. Pulmonic valve regurgitation is not visualized. Pulmonic regurgitation is not visualized. Aorta: The aortic root, ascending aorta and aortic arch are all structurally normal, with no evidence of dilitation or obstruction. Venous: The inferior vena cava is normal in size with greater than 50% respiratory variability, suggesting right atrial pressure of 3 mmHg. IAS/Shunts: No atrial level shunt detected by color flow Doppler. There is no evidence of a patent foramen ovale. No ventricular septal defect is seen or detected. There is no evidence of an atrial septal defect.  LEFT VENTRICLE PLAX 2D LVIDd:          2.60 cm LVIDs:         2.26 cm LV PW:         0.68 cm LV IVS:        0.92 cm LVOT diam:     1.70 cm LV SV:         7 ml LV SV Index:   5.30 LVOT Area:     2.27 cm  LEFT ATRIUM             Index       RIGHT ATRIUM           Index LA diam:        2.70 cm 1.84 cm/m  RA Area:     16.00 cm LA Vol (A2C):   58.4 ml 39.72 ml/m RA Volume:   45.70 ml  31.08 ml/m LA Vol (A4C):   33.6 ml 22.85 ml/m LA Biplane Vol: 48.1 ml 32.71 ml/m   AORTA Ao Root diam: 3.30 cm TRICUSPID VALVE TR Peak grad:   33.6 mmHg TR Vmax:        305.00 cm/s  SHUNTS Systemic Diam: 1.70 cm  Ida Rogue MD Electronically signed by Ida Rogue MD Signature Date/Time: 06/26/2019/7:28:13 PM    Final    [place] EKG SITE RITE  Result Date: 06/26/2019 If Site Rite image not attached, placement could not be confirmed due to current cardiac rhythm.   Cardiac Studies  Echocardiogram performed yesterday June 26, 2019 1. Left ventricular ejection fraction, by visual estimation, is 50 to  55%. The left ventricle has normal function. There is moderately increased  left ventricular hypertrophy.  2. Left ventricular diastolic parameters are indeterminate.  3. The  left ventricle has no regional wall motion abnormalities.  4. Global right ventricle has normal systolic function.The right  ventricular size is normal. No increase in right ventricular wall  thickness.  5. Left atrial size was normal.  6. Tricuspid valve regurgitation is mild-moderate.  7. Mildly elevated pulmonary artery systolic pressure.  8. Tachycardia noted/atrial flutter, rate 130 bpm    Patient Profile     Corey Carr is a 79 y.o. male with a hx of lung mass, suspected lung cancer, COPD, hypertension, Covid pneumonia December 2020, hospitalized January 2021 hypercalcemia, dehydration, renal failure, anorexia, presenting to the emergency room for hypoglycemia.  Did not eat anything yesterday morning, went in for his appointment, sugar got low, felt weak, felt  better after drinking juice, glucose measured 32 Was admitted for further observation Atrial flutter with RVR rate 150 bpm starting yesterday morning 8 AM  Assessment & Plan    Atrial flutter with RVR Treated with amiodarone 400 oral dosing x2 doses last night converting to normal sinus rhythm -Given profound anemia hemoglobin 7.6, he is a poor candidate for chronic long-term anticoagulation -Low blood pressure limiting use of beta-blockers and calcium channel blockers for rate and rhythm control -He is at increased risk of further arrhythmia --- Recommend we continue amiodarone 400 twice daily for 5-day load then 200 twice daily Outpatient follow-up, titration down to 200 daily as an outpatient in several weeks time  Lung mass/cancer Work-up per Dr. Grayland Ormond Has profound anorexia, muscle atrophy Protein calorie malnutrition  COPD Stable, no exacerbation  Severe protein calorie malnutrition May benefit from Megace, milkshakes  Hypoglycemia Dextrose IV being weaned, encouraged meals  Anemia Failure to thrive, poor nutrition, Encourage better nutrition, Unclear if he needs iron infusion Anemia of chronic disease  Plan discussed with hospitalist service, also discussed with nursing, medication changes made  Total encounter time more than 35 minutes  Greater than 50% was spent in counseling and coordination of care with the patient    For questions or updates, please contact [facility] Please consult [email or web address] for contact info under        Signed, Ida Rogue, MD  06/27/2019, 1:14 PM

## 2019-06-28 DIAGNOSIS — I4892 Unspecified atrial flutter: Secondary | ICD-10-CM

## 2019-06-28 LAB — PHOSPHORUS: Phosphorus: 1.5 mg/dL — ABNORMAL LOW (ref 2.5–4.6)

## 2019-06-28 LAB — CBC
HCT: 24.5 % — ABNORMAL LOW (ref 39.0–52.0)
Hemoglobin: 8.3 g/dL — ABNORMAL LOW (ref 13.0–17.0)
MCH: 27.3 pg (ref 26.0–34.0)
MCHC: 33.9 g/dL (ref 30.0–36.0)
MCV: 80.6 fL (ref 80.0–100.0)
Platelets: 573 10*3/uL — ABNORMAL HIGH (ref 150–400)
RBC: 3.04 MIL/uL — ABNORMAL LOW (ref 4.22–5.81)
RDW: 18.2 % — ABNORMAL HIGH (ref 11.5–15.5)
WBC: 14.7 10*3/uL — ABNORMAL HIGH (ref 4.0–10.5)
nRBC: 0 % (ref 0.0–0.2)

## 2019-06-28 LAB — COMPREHENSIVE METABOLIC PANEL
ALT: 53 U/L — ABNORMAL HIGH (ref 0–44)
AST: 32 U/L (ref 15–41)
Albumin: 2.2 g/dL — ABNORMAL LOW (ref 3.5–5.0)
Alkaline Phosphatase: 71 U/L (ref 38–126)
Anion gap: 7 (ref 5–15)
BUN: 16 mg/dL (ref 8–23)
CO2: 22 mmol/L (ref 22–32)
Calcium: 7.3 mg/dL — ABNORMAL LOW (ref 8.9–10.3)
Chloride: 108 mmol/L (ref 98–111)
Creatinine, Ser: 0.68 mg/dL (ref 0.61–1.24)
GFR calc Af Amer: >60 mL/min (ref >60)
GFR calc non Af Amer: >60 mL/min (ref >60)
Glucose, Bld: 93 mg/dL (ref 70–99)
Potassium: 4.1 mmol/L (ref 3.5–5.1)
Sodium: 137 mmol/L (ref 135–145)
Total Bilirubin: 0.9 mg/dL (ref 0.3–1.2)
Total Protein: 5.6 g/dL — ABNORMAL LOW (ref 6.5–8.1)

## 2019-06-28 LAB — HEMOGLOBIN A1C
Hgb A1c MFr Bld: 5.6 % (ref 4.8–5.6)
Mean Plasma Glucose: 114.02 mg/dL

## 2019-06-28 LAB — GLUCOSE, CAPILLARY
Glucose-Capillary: 90 mg/dL (ref 70–99)
Glucose-Capillary: 89 mg/dL (ref 70–99)
Glucose-Capillary: 126 mg/dL — ABNORMAL HIGH (ref 70–99)
Glucose-Capillary: 151 mg/dL — ABNORMAL HIGH (ref 70–99)
Glucose-Capillary: 115 mg/dL — ABNORMAL HIGH (ref 70–99)

## 2019-06-28 MED ORDER — AMIODARONE HCL 200 MG PO TABS
200.0000 mg | ORAL_TABLET | Freq: Two times a day (BID) | ORAL | Status: AC
  Administered 2019-06-28 (×2): 200 mg via ORAL
  Filled 2019-06-28 (×2): qty 1

## 2019-06-28 MED ORDER — ENOXAPARIN SODIUM 30 MG/0.3ML ~~LOC~~ SOLN
30.0000 mg | SUBCUTANEOUS | Status: AC
  Administered 2019-06-28: 30 mg via SUBCUTANEOUS
  Filled 2019-06-28: qty 0.3

## 2019-06-28 MED ORDER — ALPRAZOLAM 0.5 MG PO TABS
0.5000 mg | ORAL_TABLET | Freq: Every evening | ORAL | Status: DC
  Administered 2019-06-28: 0.5 mg via ORAL
  Filled 2019-06-28: qty 1

## 2019-06-28 MED ORDER — CEFDINIR 300 MG PO CAPS
300.0000 mg | ORAL_CAPSULE | Freq: Two times a day (BID) | ORAL | Status: DC
  Administered 2019-06-28 – 2019-06-30 (×5): 300 mg via ORAL
  Filled 2019-06-28 (×6): qty 1

## 2019-06-28 MED ORDER — ENOXAPARIN SODIUM 40 MG/0.4ML ~~LOC~~ SOLN: 40.0000 mg | SUBCUTANEOUS | Status: DC

## 2019-06-28 MED ORDER — SODIUM CHLORIDE 0.9% FLUSH
10.0000 mL | Freq: Two times a day (BID) | INTRAVENOUS | Status: DC
  Administered 2019-06-28 – 2019-06-29 (×3): 10 mL via INTRAVENOUS

## 2019-06-28 MED ORDER — AZITHROMYCIN 250 MG PO TABS
500.0000 mg | ORAL_TABLET | Freq: Every day | ORAL | Status: AC
  Administered 2019-06-28 – 2019-06-29 (×2): 500 mg via ORAL
  Filled 2019-06-28 (×2): qty 2

## 2019-06-28 MED ORDER — SODIUM PHOSPHATES 45 MMOLE/15ML IV SOLN
15.0000 mmol | Freq: Once | INTRAVENOUS | Status: AC
  Administered 2019-06-28: 15 mmol via INTRAVENOUS
  Filled 2019-06-28: qty 5

## 2019-06-28 NOTE — Progress Notes (Signed)
PHARMACIST - PHYSICIAN COMMUNICATION  CONCERNING:  Enoxaparin (Lovenox) for DVT Prophylaxis    RECOMMENDATION: Patient was prescribed enoxaprin 40mg  q24 hours for VTE prophylaxis.   Filed Weights   06/26/19 0000 06/26/19 1400 06/28/19 0252  Weight: 87 lb 4.8 oz (39.6 kg) 92 lb 2.4 oz (41.8 kg) 96 lb 14.4 oz (44 kg)    Body mass index is 14.73 kg/m.  Estimated Creatinine Clearance: 47.4 mL/min (by C-G formula based on SCr of 0.68 mg/dL).   Patient is candidate for enoxaparin 30mg  every 24 hours based on  Weight less then 57kg for men   DESCRIPTION: Pharmacy has adjusted enoxaparin dose per [facility] policy.   Patient is now receiving enoxaparin 30mg  every 24 hours.  Asami Lambright, PharmD Clinical Pharmacist  06/28/2019 10:51 AM

## 2019-06-28 NOTE — Progress Notes (Addendum)
PROGRESS NOTE    GER RINGENBERG  TKP:[id number] DOB: June 19, 1940 DOA: 06/25/2019 PCP: [name], MD  Brief Narrative: Corey Carr is a 79 y.o. male with medical history significant for COPD, recent Covid pneumonia, severe malnutrition, hypertension, ongoing work-up for lung mass suspicious for lung cancer. -On 2/3 he was getting a PET scan, during this time he was noted to be hypoglycemic with CBG of 19, subsequently found to be weak and he was transported to the emergency room . -Prior to this he was hospitalized few weeks ago with hypercalcemia, dehydration and acute kidney injury, treated with bisphosphonates, hydration etc. subsequently set up to see oncology in the office for evaluation of lung mass. -Prior to that he was hospitalized in December with Covid pneumonia and was treated with remdesivir and steroids -Admitted with hypoglycemia, postobstructive pneumonia, hospitalization complicated by rapid atrial fibrillation, subsequently converted to sinus rhythm, now on oral amiodarone, felt to be a poor candidate for anticoagulation -Pulmonary following plans for EBUS/bronchoscopy with biopsy of left lung mass on Monday 2/8   Assessment & Plan:   Hypoglycemia -Likely secondary to infection, postobstructive pneumonia, malnutrition, ongoing weight loss, likely very low glycogen stores -Finally weaned off dextrose infusion, continue Ensure between meals  Postobstructive pneumonia -Versus healthcare associated pneumonia -Treated with IV cefepime and azithromycin, day 4 of antibiotics today will transition to oral cefdinir for 3 more days  -Vague history of ? Dysphagia as well, check SLP evaluation -Wean O2 as tolerated  Lung mass highly suspicious for malignancy -Follow-up with oncology, was unable to complete his PET scan, given his overall condition I doubt he would be a candidate for aggressive cancer treatment -Pulmonary following, plan for bronchoscopy/EBUS on  Monday  Atrial fibrillation with rapid ventricular response -Patient went into rapid atrial fibrillation on 2/4, seen by Dr. Rockey Situ in consultation, subsequently spontaneously converted to sinus rhythm later that night  -Not a good candidate for anticoagulation given concurrent lung cancer, severe cachexia, frailty, fall risk  -Soft blood pressure also limits rate control agents, -Plan to continue po amiodarone for now per Dr. Rockey Situ continue amiodarone 400 mg twice daily for 5 days followed by 200 mg twice daily and eventually 200 mg daily -TSH is high but free T4 is normal, will need thyroid studies repeated in 6 weeks  Recent hypercalcemia likely from malignancy -Improved, recently treated with Zometa, fluids etc.  COPD -Stable, no wheezing at this time  Severe protein calorie malnutrition -Add supplements as tolerated   DVT prophylaxis: lovenox today, hold subsequent doses for bronchoscopy Code Status: Discussed CODE STATUS with patient and wife and, they are agreeable to DNR Family Communication: No family at bedside, called and updated wife Ethridge Sollenberger 2/4, 2/5 Disposition Plan: Home pending clinical improvement, possibly 48 hours  Consultants:   Cardiology   Procedures:   Antimicrobials:    Subjective: -No events overnight, some concern for aspiration on history, breathing better -Appetite is fair  Objective: Vitals:   06/27/19 1507 06/27/19 1954 06/28/19 0252 06/28/19 0804  BP: 111/65 106/62 115/67 117/67  Pulse: 79 78 72 74  Resp: 16 18 18 15   Temp: 98 F (36.7 C) 97.7 F (36.5 C) 98.1 F (36.7 C) (!) 97.4 F (36.3 C)  TempSrc: Oral Oral Oral Oral  SpO2:  100% 100% 100%  Weight:   44 kg   Height:        Intake/Output Summary (Last 24 hours) at 06/28/2019 1041 Last data filed at 06/28/2019 0310 Gross per 24 hour  Intake 2051.78 ml  Output 776 ml  Net 1275.78 ml   Filed Weights   06/26/19 0000 06/26/19 1400 06/28/19 0252  Weight: 39.6 kg 41.8 kg 44 kg     Examination:  Gen: Extremely cachectic male, appears much older than stated age, AAOx3, no distress HEENT: No JVD Lungs: Poor air movement bilaterally CVS: RRR,No Gallops,Rubs or new Murmurs Abd: soft, Non tender, non distended, BS present Extremities: No edema Skin: no new rashes Psychiatry: Mood & affect appropriate.     Data Reviewed:   CBC: Recent Labs  Lab 06/24/19 1030 06/25/19 1245 06/26/19 0404 06/27/19 0356 06/28/19 0442  WBC 15.8* 19.0* 14.8* 13.8* 14.7*  NEUTROABS 12.1*  --   --   --   --   HGB 10.3* 10.1* 9.0* 7.6* 8.3*  HCT 31.9* 30.0* 26.8* 22.6* 24.5*  MCV 82.0 80.2 81.0 81.0 80.6  PLT 643* 719* 615* 521* 213*   Basic Metabolic Panel: Recent Labs  Lab 06/24/19 1030 06/25/19 1245 06/26/19 0404 06/27/19 0356 06/28/19 0442  NA 137 136 136 139 137  K 3.9 4.1 3.9 3.4* 4.1  CL 104 103 106 110 108  CO2 20* 22 21* 21* 22  GLUCOSE 97 195* 104* 105* 93  BUN 21 31* 25* 14 16  CREATININE 0.98 1.12 0.84 0.68 0.68  CALCIUM 9.1 8.6* 8.0* 7.2* 7.3*  MG  --   --  1.7  --   --   PHOS  --   --  1.1* 1.8*  --    GFR: Estimated Creatinine Clearance: 47.4 mL/min (by C-G formula based on SCr of 0.68 mg/dL). Liver Function Tests: Recent Labs  Lab 06/24/19 1030 06/25/19 1245 06/26/19 0404 06/28/19 0442  AST 18 19 22  32  ALT 17 17 20  53*  ALKPHOS 85 82 68 71  BILITOT 0.8 0.7 1.0 0.9  PROT 6.9 6.9 5.9* 5.6*  ALBUMIN 3.1* 3.0* 2.6* 2.2*   No results for input(s): LIPASE, AMYLASE in the last 168 hours. No results for input(s): AMMONIA in the last 168 hours. Coagulation Profile: No results for input(s): INR, PROTIME in the last 168 hours. Cardiac Enzymes: No results for input(s): CKTOTAL, CKMB, CKMBINDEX, TROPONINI in the last 168 hours. BNP (last 3 results) No results for input(s): PROBNP in the last 8760 hours. HbA1C: Recent Labs    06/26/19 0404  HGBA1C 5.6   CBG: Recent Labs  Lab 06/27/19 1409 06/27/19 1654 06/27/19 2123 06/28/19 0334  06/28/19 0806  GLUCAP 147* 89 98 90 89   Lipid Profile: No results for input(s): CHOL, HDL, LDLCALC, TRIG, CHOLHDL, LDLDIRECT in the last 72 hours. Thyroid Function Tests: Recent Labs    06/27/19 0356  TSH 4.957*  FREET4 1.08   Anemia Panel: No results for input(s): VITAMINB12, FOLATE, FERRITIN, TIBC, IRON, RETICCTPCT in the last 72 hours. Urine analysis:    Component Value Date/Time   COLORURINE AMBER (A) 06/25/2019 1704   APPEARANCEUR HAZY (A) 06/25/2019 1704   APPEARANCEUR Clear 11/14/2016 1538   LABSPEC 1.024 06/25/2019 1704   PHURINE 5.0 06/25/2019 1704   GLUCOSEU 50 (A) 06/25/2019 1704   HGBUR NEGATIVE 06/25/2019 1704   BILIRUBINUR NEGATIVE 06/25/2019 1704   BILIRUBINUR Negative 11/14/2016 1538   KETONESUR NEGATIVE 06/25/2019 1704   PROTEINUR 30 (A) 06/25/2019 1704   NITRITE NEGATIVE 06/25/2019 1704   LEUKOCYTESUR NEGATIVE 06/25/2019 1704   Sepsis Labs: @LABRCNTIP (procalcitonin:4,lacticidven:4)  ) Recent Results (from the past 240 hour(s))  Blood culture (routine x 2)     Status: None (Preliminary  result)   Collection Time: 06/25/19  8:58 PM   Specimen: BLOOD  Result Value Ref Range Status   Specimen Description BLOOD LEFT ASSIST CONTROL  Final   Special Requests   Final    BOTTLES DRAWN AEROBIC AND ANAEROBIC Blood Culture adequate volume   Culture   Final    NO GROWTH 3 DAYS Performed at [facility], [address] [place], [place] [address]    Report Status PENDING  Incomplete  Blood culture (routine x 2)     Status: None (Preliminary result)   Collection Time: 06/25/19  8:58 PM   Specimen: BLOOD  Result Value Ref Range Status   Specimen Description BLOOD LEFT ASSIST CONTROL  Final   Special Requests   Final    BOTTLES DRAWN AEROBIC AND ANAEROBIC Blood Culture results may not be optimal due to an excessive volume of blood received in culture bottles   Culture   Final    NO GROWTH 3 DAYS Performed at [facility], [address] [place], [place] [address]    Report Status PENDING  Incomplete  MRSA PCR Screening     Status: None   Collection Time: 06/26/19  2:06 PM   Specimen: Nasal Mucosa; Nasopharyngeal  Result Value Ref Range Status   MRSA by PCR NEGATIVE NEGATIVE Final    Comment:        The GeneXpert MRSA Assay (FDA approved for NASAL specimens only), is one component of a comprehensive MRSA colonization surveillance program. It is not intended to diagnose MRSA infection nor to guide or monitor treatment for MRSA infections. Performed at [facility], [address], [place], [place] [address]          Radiology Studies: ECHOCARDIOGRAM COMPLETE  Result Date: 06/26/2019   ECHOCARDIOGRAM REPORT   Patient Name:   JAZPER NIKOLAI Date of Exam: 06/26/2019 Medical Rec #:  [id number]       Height:       68.0 in Accession #:    [id number]      Weight:       92.2 lb Date of Birth:  [birth year]       BSA:          1.47 m Patient Age:    43 years        BP:           89/66 mmHg Patient Gender: M               HR:           129 bpm. Exam Location:  [facility] Procedure: 2D Echo Indications:     ATRIAL FIBRILLATION 427.31/I48.91  History:         Patient has no prior history of Echocardiogram examinations.                  COPD; Risk Factors:Hypertension.  Sonographer:     Avanell Shackleton Referring Phys:  [place] Diagnosing Phys: Ida Rogue MD  Sonographer Comments: Image acquisition challenging due to patient body habitus. IMPRESSIONS  1. Left ventricular ejection fraction, by visual estimation, is 50 to 55%. The left ventricle has normal function. There is moderately increased left ventricular hypertrophy.  2. Left ventricular diastolic parameters are indeterminate.  3. The left ventricle has no regional wall motion abnormalities.  4. Global right ventricle has normal systolic function.The right ventricular size is normal. No increase in right ventricular wall thickness.  5. Left atrial size was normal.  6. Tricuspid valve regurgitation is mild-moderate.  7. Mildly elevated pulmonary artery systolic pressure.  8. Tachycardia noted/atrial flutter, rate 130 bpm FINDINGS  Left Ventricle: Left ventricular ejection fraction, by visual estimation, is 50 to 55%. The left ventricle has normal function. The left ventricle has no regional wall motion abnormalities. There is moderately increased left ventricular hypertrophy. Left ventricular diastolic parameters are indeterminate. Normal left atrial pressure. Right Ventricle: The right ventricular size is normal. No increase in right ventricular wall thickness. Global RV systolic function is has normal systolic function. The tricuspid regurgitant velocity is 2.90 m/s, and with an assumed right atrial pressure  of 5 mmHg, the estimated right ventricular systolic pressure is mildly elevated at 38.6 mmHg. Left Atrium: Left atrial size was normal in size. Right Atrium: Right atrial size was normal in size Pericardium: There is no evidence of pericardial effusion. Mitral Valve: The mitral valve is normal in structure. Mild to moderate mitral valve regurgitation. No evidence of mitral valve stenosis by observation. Tricuspid Valve: The tricuspid valve is normal in structure. Tricuspid valve regurgitation is mild-moderate. Aortic Valve: The aortic valve is normal in structure. Aortic valve regurgitation is not visualized. Mild to moderate aortic valve sclerosis/calcification is present, without any evidence of aortic stenosis. Pulmonic Valve: The pulmonic valve was normal in structure. Pulmonic valve regurgitation is not visualized. Pulmonic regurgitation is not visualized. Aorta: The aortic root, ascending aorta and aortic arch are all structurally normal, with no evidence of dilitation or obstruction. Venous: The inferior vena cava is normal in size with greater than 50% respiratory variability, suggesting right atrial pressure of 3 mmHg. IAS/Shunts: No atrial level shunt detected  by color flow Doppler. There is no evidence of a patent foramen ovale. No ventricular septal defect is seen or detected. There is no evidence of an atrial septal defect.  LEFT VENTRICLE PLAX 2D LVIDd:         2.60 cm LVIDs:         2.26 cm LV PW:         0.68 cm LV IVS:        0.92 cm LVOT diam:     1.70 cm LV SV:         7 ml LV SV Index:   5.30 LVOT Area:     2.27 cm  LEFT ATRIUM             Index       RIGHT ATRIUM           Index LA diam:        2.70 cm 1.84 cm/m  RA Area:     16.00 cm LA Vol (A2C):   58.4 ml 39.72 ml/m RA Volume:   45.70 ml  31.08 ml/m LA Vol (A4C):   33.6 ml 22.85 ml/m LA Biplane Vol: 48.1 ml 32.71 ml/m   AORTA Ao Root diam: 3.30 cm TRICUSPID VALVE TR Peak grad:   33.6 mmHg TR Vmax:        305.00 cm/s  SHUNTS Systemic Diam: 1.70 cm  Ida Rogue MD Electronically signed by Ida Rogue MD Signature Date/Time: 06/26/2019/7:28:13 PM    Final    [place] EKG SITE RITE  Result Date: 06/26/2019 If Site Rite image not attached, placement could not be confirmed due to current cardiac rhythm.       Scheduled Meds: . azithromycin  500 mg Oral Daily  . butamben-tetracaine-benzocaine  1 spray Topical Once  . Chlorhexidine Gluconate Cloth  6 each Topical  Daily  . feeding supplement (ENSURE ENLIVE)  237 mL Oral TID BM  . fluticasone furoate-vilanterol  1 puff Inhalation q morning - 10a   And  . umeclidinium bromide  1 puff Inhalation q morning - 10a  . lidocaine  1 application Topical Once  . multivitamin with minerals  1 tablet Oral Daily  . phosphorus  500 mg Oral Once  . sodium chloride flush  10-40 mL Intracatheter Q12H   Continuous Infusions: . sodium chloride Stopped (06/28/19 0218)  . ceFEPime (MAXIPIME) IV Stopped (06/27/19 2215)     LOS: 2 days    Time spent: 42min  Domenic Polite, MD [facility]  06/28/2019, 10:41 AM

## 2019-06-28 NOTE — Progress Notes (Signed)
Progress Note  Patient Name: Corey Carr Date of Encounter: 06/28/2019  Primary Cardiologist: [facility]  Subjective   No acute events overnight. Feeling "strong." Asking if he still needs procedure on Monday, explained that is to look at the lungs, not the heart. No chest pain, breathing is stable.  Inpatient Medications    Scheduled Meds: . amiodarone  200 mg Oral BID  . azithromycin  500 mg Oral Daily  . butamben-tetracaine-benzocaine  1 spray Topical Once  . cefdinir  300 mg Oral Q12H  . Chlorhexidine Gluconate Cloth  6 each Topical Daily  . feeding supplement (ENSURE ENLIVE)  237 mL Oral TID BM  . fluticasone furoate-vilanterol  1 puff Inhalation q morning - 10a   And  . umeclidinium bromide  1 puff Inhalation q morning - 10a  . lidocaine  1 application Topical Once  . multivitamin with minerals  1 tablet Oral Daily  . phosphorus  500 mg Oral Once  . sodium chloride flush  10-40 mL Intracatheter Q12H   Continuous Infusions: . sodium chloride Stopped (06/28/19 0218)   PRN Meds: sodium chloride, acetaminophen **OR** acetaminophen, albuterol, HYDROcodone-acetaminophen, ondansetron **OR** ondansetron (ZOFRAN) IV, sodium chloride flush   Vital Signs    Vitals:   06/27/19 1507 06/27/19 1954 06/28/19 0252 06/28/19 0804  BP: 111/65 106/62 115/67 117/67  Pulse: 79 78 72 74  Resp: 16 18 18 15   Temp: 98 F (36.7 C) 97.7 F (36.5 C) 98.1 F (36.7 C) (!) 97.4 F (36.3 C)  TempSrc: Oral Oral Oral Oral  SpO2:  100% 100% 100%  Weight:   44 kg   Height:        Intake/Output Summary (Last 24 hours) at 06/28/2019 1220 Last data filed at 06/28/2019 0310 Gross per 24 hour  Intake 120 ml  Output 426 ml  Net -306 ml   Last 3 Weights 06/28/2019 06/26/2019 06/26/2019  Weight (lbs) 96 lb 14.4 oz 92 lb 2.4 oz 87 lb 4.8 oz  Weight (kg) 43.954 kg 41.8 kg 39.6 kg      Telemetry    Has remained in sinus rhythm- personally Reviewed  ECG    No new since 1/26- Personally  Reviewed  Physical Exam   GEN: cachectic appearing, in no acute distress NECK: No JVD CARDIAC: regular rhythm, normal S1 and S2, no rubs or gallops. No murmur. VASCULAR: Radial pulses 2+ bilaterally. RESPIRATORY:  Diffuse mild coarseness ABDOMEN: Soft, non-tender, non-distended MUSCULOSKELETAL:  Ambulates independently SKIN: Warm and dry, no edema NEUROLOGIC:  Alert and oriented x 3. No focal neuro deficits noted. PSYCHIATRIC:  Normal affect   Labs    High Sensitivity Troponin:   Recent Labs  Lab 06/17/19 1511  TROPONINIHS 29*      Chemistry Recent Labs  Lab 06/25/19 1245 06/25/19 1245 06/26/19 0404 06/27/19 0356 06/28/19 0442  NA 136   < > 136 139 137  K 4.1   < > 3.9 3.4* 4.1  CL 103   < > 106 110 108  CO2 22   < > 21* 21* 22  GLUCOSE 195*   < > 104* 105* 93  BUN 31*   < > 25* 14 16  CREATININE 1.12   < > 0.84 0.68 0.68  CALCIUM 8.6*   < > 8.0* 7.2* 7.3*  PROT 6.9  --  5.9*  --  5.6*  ALBUMIN 3.0*  --  2.6*  --  2.2*  AST 19  --  22  --  32  ALT 17  --  20  --  53*  ALKPHOS 82  --  68  --  71  BILITOT 0.7  --  1.0  --  0.9  GFRNONAA >60   < > >60 >60 >60  GFRAA >60   < > >60 >60 >60  ANIONGAP 11   < > 9 8 7    < > = values in this interval not displayed.     Hematology Recent Labs  Lab 06/26/19 0404 06/27/19 0356 06/28/19 0442  WBC 14.8* 13.8* 14.7*  RBC 3.31* 2.79* 3.04*  HGB 9.0* 7.6* 8.3*  HCT 26.8* 22.6* 24.5*  MCV 81.0 81.0 80.6  MCH 27.2 27.2 27.3  MCHC 33.6 33.6 33.9  RDW 17.7* 18.0* 18.2*  PLT 615* 521* 573*    BNPNo results for input(s): BNP, PROBNP in the last 168 hours.   DDimer No results for input(s): DDIMER in the last 168 hours.   Radiology    ECHOCARDIOGRAM COMPLETE  Result Date: 06/26/2019   ECHOCARDIOGRAM REPORT   Patient Name:   Corey Carr Date of Exam: 06/26/2019 Medical Rec #:  [id number]       Height:       68.0 in Accession #:    [id number]      Weight:       92.2 lb Date of Birth:  [birth year]       BSA:           1.47 m Patient Age:    79 years        BP:           89/66 mmHg Patient Gender: M               HR:           129 bpm. Exam Location:  [facility] Procedure: 2D Echo Indications:     ATRIAL FIBRILLATION 427.31/I48.91  History:         Patient has no prior history of Echocardiogram examinations.                  COPD; Risk Factors:Hypertension.  Sonographer:     Avanell Shackleton Referring Phys:  [place] Diagnosing Phys: Ida Rogue MD  Sonographer Comments: Image acquisition challenging due to patient body habitus. IMPRESSIONS  1. Left ventricular ejection fraction, by visual estimation, is 50 to 55%. The left ventricle has normal function. There is moderately increased left ventricular hypertrophy.  2. Left ventricular diastolic parameters are indeterminate.  3. The left ventricle has no regional wall motion abnormalities.  4. Global right ventricle has normal systolic function.The right ventricular size is normal. No increase in right ventricular wall thickness.  5. Left atrial size was normal.  6. Tricuspid valve regurgitation is mild-moderate.  7. Mildly elevated pulmonary artery systolic pressure.  8. Tachycardia noted/atrial flutter, rate 130 bpm FINDINGS  Left Ventricle: Left ventricular ejection fraction, by visual estimation, is 50 to 55%. The left ventricle has normal function. The left ventricle has no regional wall motion abnormalities. There is moderately increased left ventricular hypertrophy. Left ventricular diastolic parameters are indeterminate. Normal left atrial pressure. Right Ventricle: The right ventricular size is normal. No increase in right ventricular wall thickness. Global RV systolic function is has normal systolic function. The tricuspid regurgitant velocity is 2.90 m/s, and with an assumed right atrial pressure  of 5 mmHg, the estimated right ventricular systolic pressure is mildly elevated at 38.6 mmHg. Left Atrium: Left atrial size was normal in size.  Right Atrium: Right atrial  size was normal in size Pericardium: There is no evidence of pericardial effusion. Mitral Valve: The mitral valve is normal in structure. Mild to moderate mitral valve regurgitation. No evidence of mitral valve stenosis by observation. Tricuspid Valve: The tricuspid valve is normal in structure. Tricuspid valve regurgitation is mild-moderate. Aortic Valve: The aortic valve is normal in structure. Aortic valve regurgitation is not visualized. Mild to moderate aortic valve sclerosis/calcification is present, without any evidence of aortic stenosis. Pulmonic Valve: The pulmonic valve was normal in structure. Pulmonic valve regurgitation is not visualized. Pulmonic regurgitation is not visualized. Aorta: The aortic root, ascending aorta and aortic arch are all structurally normal, with no evidence of dilitation or obstruction. Venous: The inferior vena cava is normal in size with greater than 50% respiratory variability, suggesting right atrial pressure of 3 mmHg. IAS/Shunts: No atrial level shunt detected by color flow Doppler. There is no evidence of a patent foramen ovale. No ventricular septal defect is seen or detected. There is no evidence of an atrial septal defect.  LEFT VENTRICLE PLAX 2D LVIDd:         2.60 cm LVIDs:         2.26 cm LV PW:         0.68 cm LV IVS:        0.92 cm LVOT diam:     1.70 cm LV SV:         7 ml LV SV Index:   5.30 LVOT Area:     2.27 cm  LEFT ATRIUM             Index       RIGHT ATRIUM           Index LA diam:        2.70 cm 1.84 cm/m  RA Area:     16.00 cm LA Vol (A2C):   58.4 ml 39.72 ml/m RA Volume:   45.70 ml  31.08 ml/m LA Vol (A4C):   33.6 ml 22.85 ml/m LA Biplane Vol: 48.1 ml 32.71 ml/m   AORTA Ao Root diam: 3.30 cm TRICUSPID VALVE TR Peak grad:   33.6 mmHg TR Vmax:        305.00 cm/s  SHUNTS Systemic Diam: 1.70 cm  Ida Rogue MD Electronically signed by Ida Rogue MD Signature Date/Time: 06/26/2019/7:28:13 PM    Final    [place] EKG SITE RITE  Result Date:  06/26/2019 If Site Rite image not attached, placement could not be confirmed due to current cardiac rhythm.   Cardiac Studies  Echocardiogram performed yesterday June 26, 2019 1. Left ventricular ejection fraction, by visual estimation, is 50 to  55%. The left ventricle has normal function. There is moderately increased  left ventricular hypertrophy.  2. Left ventricular diastolic parameters are indeterminate.  3. The left ventricle has no regional wall motion abnormalities.  4. Global right ventricle has normal systolic function.The right  ventricular size is normal. No increase in right ventricular wall  thickness.  5. Left atrial size was normal.  6. Tricuspid valve regurgitation is mild-moderate.  7. Mildly elevated pulmonary artery systolic pressure.  8. Tachycardia noted/atrial flutter, rate 130 bpm    Patient Profile     Corey Carr is a 79 y.o. male with a hx of lung mass, suspected lung cancer, COPD, hypertension, Covid pneumonia December 2020, hospitalized January 2021 for hypercalcemia, dehydration, renal failure, anorexia, hypoglycemia.    During hospitalization, went into atrial flutter with RVR.  Assessment & Plan    Atrial flutter with RVR -has remained in sinus rhythm since initiation of oral amiodarone -given anemia, pending procedure, frailty, anticoagulation was not started. -with possible lung cancer, he is at risk of atrial arrhythmia.  -he is tolerating 200 mg BID amiodarone. Can drop to 200 mg daily at outpatient follow up. Will need LFTs, TFTs followed. Pulm involvement may be difficult to determine given his comorbidities.  Lung mass/cancer Work-up per Dr. Grayland Ormond  COPD Stable, no exacerbation  Per primary team: Anemia of chronic disease Failure to thrive Protein calorie malnutrition, severe.   We will continue to follow his rhythm and make adjustments as necessary.  For questions or updates, please contact [place] Please  consult [email or web address] for contact info under     Signed, Buford Dresser, MD  06/28/2019, 12:20 PM

## 2019-06-28 NOTE — Progress Notes (Signed)
Pulmonary Medicine          Date: 06/28/2019,   MRN# [id number] Corey Carr [birth year]     AdmissionWeight: 43.1 kg                 CurrentWeight: 44 kg      CHIEF COMPLAINT:   Left lung mass with hilar mediastinal lymphadenopathy   SUBJECTIVE   Patient states he feels anxious and is unable to sleep , he requests something to help him sleep, will start QHS 0.5mg  po alprazolam.   He is breathing well with 1-2L/min [place].  No chest pain, cough, discomfort.    PAST MEDICAL HISTORY   Past Medical History:  Diagnosis Date  . Hypertension      SURGICAL HISTORY   Past Surgical History:  Procedure Laterality Date  . KNEE SURGERY Right 1957     FAMILY HISTORY   Family History  Problem Relation Age of Onset  . Emphysema Father   . Lung cancer Maternal Uncle      SOCIAL HISTORY   Social History   Tobacco Use  . Smoking status: Former Smoker    Packs/day: 0.50    Years: 60.00    Pack years: 30.00    Types: Cigarettes    Quit date: 2015    Years since quitting: 6.1  . Smokeless tobacco: Never Used  Substance Use Topics  . Alcohol use: No  . Drug use: No     MEDICATIONS    Home Medication:  Current Outpatient Rx  . Order #: [id number] Class: Normal  . Order #: [id number] Class: Normal    Current Medication:  Current Facility-Administered Medications:  .  0.9 %  sodium chloride infusion, , Intravenous, PRN, Domenic Polite, MD, Stopped at 06/28/19 [phone] .  acetaminophen (TYLENOL) tablet 650 mg, 650 mg, Oral, Q6H PRN **OR** acetaminophen (TYLENOL) suppository 650 mg, 650 mg, Rectal, Q6H PRN, Doutova, Anastassia, MD .  albuterol (VENTOLIN HFA) 108 (90 Base) MCG/ACT inhaler 2 puff, 2 puff, Inhalation, Q3H PRN, Doutova, Anastassia, MD .  ALPRAZolam Duanne Moron) tablet 0.5 mg, 0.5 mg, Oral, QPM, Javaughn Opdahl, MD .  amiodarone (PACERONE) tablet 200 mg, 200 mg, Oral, BID, Domenic Polite, MD, 200 mg at 06/28/19 1246 .  azithromycin (ZITHROMAX) tablet  500 mg, 500 mg, Oral, Daily, Shanlever, Charles M, RPH .  butamben-tetracaine-benzocaine (CETACAINE) spray 1 spray, 1 spray, Topical, Once, Shahan Starks, MD .  cefdinir (OMNICEF) capsule 300 mg, 300 mg, Oral, Q12H, Domenic Polite, MD, 300 mg at 06/28/19 1246 .  Chlorhexidine Gluconate Cloth 2 % PADS 6 each, 6 each, Topical, Daily, Domenic Polite, MD, 6 each at 06/27/19 1000 .  feeding supplement (ENSURE ENLIVE) (ENSURE ENLIVE) liquid 237 mL, 237 mL, Oral, TID BM, Domenic Polite, MD, 237 mL at 06/27/19 2146 .  fluticasone furoate-vilanterol (BREO ELLIPTA) 100-25 MCG/INH 1 puff, 1 puff, Inhalation, q morning - 10a, 1 puff at 06/28/19 0939 **AND** umeclidinium bromide (INCRUSE ELLIPTA) 62.5 MCG/INH 1 puff, 1 puff, Inhalation, q morning - 10a, Hart Robinsons A, RPH, 1 puff at 06/28/19 0939 .  HYDROcodone-acetaminophen (NORCO/VICODIN) 5-325 MG per tablet 1-2 tablet, 1-2 tablet, Oral, Q4H PRN, Doutova, Anastassia, MD .  lidocaine (XYLOCAINE) 2 % jelly 1 application, 1 application, Topical, Once, Freman Lapage, MD .  multivitamin with minerals tablet 1 tablet, 1 tablet, Oral, Daily, Domenic Polite, MD, 1 tablet at 06/28/19 [phone] .  ondansetron (ZOFRAN) tablet 4 mg, 4 mg, Oral, Q6H PRN **OR** ondansetron (ZOFRAN) injection 4 mg, 4 mg,  Intravenous, Q6H PRN, Doutova, Anastassia, MD .  phosphorus (K PHOS NEUTRAL) tablet 500 mg, 500 mg, Oral, Once, Domenic Polite, MD .  sodium chloride flush (NS) 0.9 % injection 10-40 mL, 10-40 mL, Intracatheter, Q12H, Domenic Polite, MD, 10 mL at 06/28/19 0939 .  sodium chloride flush (NS) 0.9 % injection 10-40 mL, 10-40 mL, Intracatheter, PRN, Domenic Polite, MD .  sodium phosphate 15 mmol in dextrose 5 % 250 mL infusion, 15 mmol, Intravenous, Once, Domenic Polite, MD, Last Rate: 43 mL/hr at 06/28/19 1511, 15 mmol at 06/28/19 1511    ALLERGIES   Patient has no known allergies.     REVIEW OF SYSTEMS    Review of Systems:  Gen:  Denies  fever, sweats,  chills weigh loss  HEENT: Denies blurred vision, double vision, ear pain, eye pain, hearing loss, nose bleeds, sore throat Cardiac:  No dizziness, chest pain or heaviness, chest tightness,edema Resp:   Denies cough or sputum porduction, shortness of breath,wheezing, hemoptysis,  Gi: Denies swallowing difficulty, stomach pain, nausea or vomiting, diarrhea, constipation, bowel incontinence Gu:  Denies bladder incontinence, burning urine Ext:   Denies Joint pain, stiffness or swelling Skin: Denies  skin rash, easy bruising or bleeding or hives Endoc:  Denies polyuria, polydipsia , polyphagia or weight change Psych:   Denies depression, insomnia or hallucinations   Other:  All other systems negative   VS: BP 120/70 (BP Location: Left Arm)   Pulse 77   Temp (!) 97.5 F (36.4 C) (Oral)   Resp 16   Ht 5\' 8"  (1.727 m)   Wt 44 kg   SpO2 100%   BMI 14.73 kg/m      PHYSICAL EXAM    GENERAL:NAD, no fevers, chills, no weakness no fatigue HEAD: Normocephalic, atraumatic.  EYES: Pupils equal, round, reactive to light. Extraocular muscles intact. No scleral icterus.  MOUTH: Moist mucosal membrane. Dentition intact. No abscess noted.  EAR, NOSE, THROAT: Clear without exudates. No external lesions.  NECK: Supple. No thyromegaly. No nodules. No JVD.  PULMONARY: Diffuse coarse rhonchi right sided +wheezes CARDIOVASCULAR: S1 and S2. Regular rate and rhythm. No murmurs, rubs, or gallops. No edema. Pedal pulses 2+ bilaterally.  GASTROINTESTINAL: Soft, nontender, nondistended. No masses. Positive bowel sounds. No hepatosplenomegaly.  MUSCULOSKELETAL: No swelling, clubbing, or edema. Range of motion full in all extremities.  NEUROLOGIC: Cranial nerves II through XII are intact. No gross focal neurological deficits. Sensation intact. Reflexes intact.  SKIN: No ulceration, lesions, rashes, or cyanosis. Skin warm and dry. Turgor intact.  PSYCHIATRIC: Mood, affect within normal limits. The patient is  awake, alert and oriented x 3. Insight, judgment intact.       IMAGING    DG Chest 2 View  Result Date: 06/17/2019 CLINICAL DATA:  Dizziness and shortness of breath. EXAM: CHEST - 2 VIEW COMPARISON:  05/23/2019. FINDINGS: Similar appearance of volume loss in the left hemithorax with elevation of the left hemidiaphragm and left base collapse/consolidative opacity. There is diffuse interstitial and pleural opacity in the left chest. Right lung is hyperexpanded without focal consolidation or pleural effusion. Bones are diffusely demineralized. IMPRESSION: Largely stable exam. Marked volume loss left hemithorax with posterior collapse/consolidative change in diffuse pleural thickening. Electronically Signed   By: Misty Stanley M.D.   On: 06/17/2019 16:43   CT Angio Chest PE W/Cm &/Or Wo Cm  Result Date: 06/17/2019 CLINICAL DATA:  Shortness of breath EXAM: CT ANGIOGRAPHY CHEST WITH CONTRAST TECHNIQUE: Multidetector CT imaging of the chest was performed using the  standard protocol during bolus administration of intravenous contrast. Multiplanar CT image reconstructions and MIPs were obtained to evaluate the vascular anatomy. CONTRAST:  14mL OMNIPAQUE IOHEXOL 350 MG/ML SOLN COMPARISON:  03/06/2019 FINDINGS: Cardiovascular: No filling defects in the pulmonary arteries to suggest pulmonary emboli. Heart is normal size. Aorta normal caliber. Aortic and coronary artery atherosclerosis. Mediastinum/Nodes: There is pneumomediastinum. Gas seen around the main pulmonary artery and in the posterior mediastinum. No adenopathy. Lungs/Pleura: Volume loss on the left with elevation of the left hemidiaphragm. Findings are chronic. There is a chronic masslike area of consolidation in the left lower lobe which is similar to prior study. Advanced centrilobular emphysema and areas of scarring in both lungs. Possible small left effusion, stable. Upper Abdomen: Imaging into the upper abdomen shows no acute findings.  Musculoskeletal: Cachexia.  No acute bony abnormality. Review of the MIP images confirms the above findings. IMPRESSION: Pneumomediastinum seen in the middle and posterior mediastinum. No associated pneumothorax. Advanced centrilobular emphysema. Stable chronic volume loss on the left with elevation of the left hemidiaphragm and masslike consolidation in the left lower lobe. As recommended on prior study, bronchoscopy with attention to the left lower lobe and/or PET CT may be helpful to exclude left lower lobe mass lesion. Small right pleural effusion. Areas of scarring in the lungs bilaterally. Aortic Atherosclerosis (ICD10-I70.0) and Emphysema (ICD10-J43.9). Electronically Signed   By: Rolm Baptise M.D.   On: 06/17/2019 21:12   CT Abdomen Pelvis W Contrast  Result Date: 06/17/2019 CLINICAL DATA:  Hypercalcemia. EXAM: CT ABDOMEN AND PELVIS WITH CONTRAST TECHNIQUE: Multidetector CT imaging of the abdomen and pelvis was performed using the standard protocol following bolus administration of intravenous contrast. CONTRAST:  51mL OMNIPAQUE IOHEXOL 350 MG/ML SOLN COMPARISON:  None. FINDINGS: Lower chest: Masslike opacity seen in the left lower lobe as described on prior chest CT. Trace left pleural effusion. Volume loss on the left with elevation of the left hemidiaphragm. Advanced emphysema. Scarring in the right lower lobe. Hepatobiliary: No focal hepatic abnormality. Gallbladder unremarkable. Pancreas: Calcifications throughout the pancreas compatible with chronic pancreatitis. No evidence of acute pancreatitis, fall suspicious pancreatic lesion or pancreatic ductal dilatation. Spleen: No focal abnormality.  Normal size. Adrenals/Urinary Tract: Small cysts in the left kidney. No hydronephrosis. Adrenal glands and urinary bladder unremarkable. Stomach/Bowel: Large stool burden throughout the colon. Distention of the rectum with stool concerning for fecal impaction. Stomach and small bowel decompressed,  unremarkable. Vascular/Lymphatic: Aortic atherosclerosis. No enlarged abdominal or pelvic lymph nodes. Reproductive: Prostate prominence. Other: No free fluid or free air. Musculoskeletal: Cachexia. No acute bony abnormality. Degenerative disc and facet disease in the lower lumbar spine. IMPRESSION: Masslike opacity in the left lower lobe as described on chest CT concerning for possible pulmonary mass lesions/malignancy. This could be further evaluated with bronchoscopy and/or PET CT as described on prior chest CT. Advanced emphysema. Large stool burden throughout the colon. Appearance is concerning for fecal impaction. Chronic pancreatitis changes.  No evidence of acute pancreatitis. Aortic atherosclerosis. Electronically Signed   By: Rolm Baptise M.D.   On: 06/17/2019 21:16   DG Chest Port 1 View  Result Date: 06/25/2019 CLINICAL DATA:  Weakness, COVID-19 positive EXAM: PORTABLE CHEST 1 VIEW COMPARISON:  06/17/2019 FINDINGS: Chronic volume loss with consolidation in the left hemithorax, stable from prior. There may be mildly increased opacity within the aerated portion of the left lung. Heart size is stable. Mediastinal structures remain shifted to the left. There is hyperexpansion of the right lung field with chronic emphysematous changes  and right apical scarring. No pneumothorax. IMPRESSION: Chronic volume loss with consolidation in the left hemithorax, stable from prior. There may be mildly increased opacity within the aerated portion of the left lung, which could reflect pneumonia given the patient's history. Electronically Signed   By: Davina Poke D.O.   On: 06/25/2019 13:31   ECHOCARDIOGRAM COMPLETE  Result Date: 06/26/2019   ECHOCARDIOGRAM REPORT   Patient Name:   Corey Carr Date of Exam: 06/26/2019 Medical Rec #:  [id number]       Height:       68.0 in Accession #:    [id number]      Weight:       92.2 lb Date of Birth:  1941-04-20       BSA:          1.47 m Patient Age:    21 years         BP:           89/66 mmHg Patient Gender: M               HR:           129 bpm. Exam Location:  [facility] Procedure: 2D Echo Indications:     ATRIAL FIBRILLATION 427.31/I48.91  History:         Patient has no prior history of Echocardiogram examinations.                  COPD; Risk Factors:Hypertension.  Sonographer:     Avanell Shackleton Referring Phys:  [place] Diagnosing Phys: Ida Rogue MD  Sonographer Comments: Image acquisition challenging due to patient body habitus. IMPRESSIONS  1. Left ventricular ejection fraction, by visual estimation, is 50 to 55%. The left ventricle has normal function. There is moderately increased left ventricular hypertrophy.  2. Left ventricular diastolic parameters are indeterminate.  3. The left ventricle has no regional wall motion abnormalities.  4. Global right ventricle has normal systolic function.The right ventricular size is normal. No increase in right ventricular wall thickness.  5. Left atrial size was normal.  6. Tricuspid valve regurgitation is mild-moderate.  7. Mildly elevated pulmonary artery systolic pressure.  8. Tachycardia noted/atrial flutter, rate 130 bpm FINDINGS  Left Ventricle: Left ventricular ejection fraction, by visual estimation, is 50 to 55%. The left ventricle has normal function. The left ventricle has no regional wall motion abnormalities. There is moderately increased left ventricular hypertrophy. Left ventricular diastolic parameters are indeterminate. Normal left atrial pressure. Right Ventricle: The right ventricular size is normal. No increase in right ventricular wall thickness. Global RV systolic function is has normal systolic function. The tricuspid regurgitant velocity is 2.90 m/s, and with an assumed right atrial pressure  of 5 mmHg, the estimated right ventricular systolic pressure is mildly elevated at 38.6 mmHg. Left Atrium: Left atrial size was normal in size. Right Atrium: Right atrial size was normal in size Pericardium:  There is no evidence of pericardial effusion. Mitral Valve: The mitral valve is normal in structure. Mild to moderate mitral valve regurgitation. No evidence of mitral valve stenosis by observation. Tricuspid Valve: The tricuspid valve is normal in structure. Tricuspid valve regurgitation is mild-moderate. Aortic Valve: The aortic valve is normal in structure. Aortic valve regurgitation is not visualized. Mild to moderate aortic valve sclerosis/calcification is present, without any evidence of aortic stenosis. Pulmonic Valve: The pulmonic valve was normal in structure. Pulmonic valve regurgitation is not visualized. Pulmonic regurgitation is not visualized. Aorta: The aortic root, ascending  aorta and aortic arch are all structurally normal, with no evidence of dilitation or obstruction. Venous: The inferior vena cava is normal in size with greater than 50% respiratory variability, suggesting right atrial pressure of 3 mmHg. IAS/Shunts: No atrial level shunt detected by color flow Doppler. There is no evidence of a patent foramen ovale. No ventricular septal defect is seen or detected. There is no evidence of an atrial septal defect.  LEFT VENTRICLE PLAX 2D LVIDd:         2.60 cm LVIDs:         2.26 cm LV PW:         0.68 cm LV IVS:        0.92 cm LVOT diam:     1.70 cm LV SV:         7 ml LV SV Index:   5.30 LVOT Area:     2.27 cm  LEFT ATRIUM             Index       RIGHT ATRIUM           Index LA diam:        2.70 cm 1.84 cm/m  RA Area:     16.00 cm LA Vol (A2C):   58.4 ml 39.72 ml/m RA Volume:   45.70 ml  31.08 ml/m LA Vol (A4C):   33.6 ml 22.85 ml/m LA Biplane Vol: 48.1 ml 32.71 ml/m   AORTA Ao Root diam: 3.30 cm TRICUSPID VALVE TR Peak grad:   33.6 mmHg TR Vmax:        305.00 cm/s  SHUNTS Systemic Diam: 1.70 cm  Ida Rogue MD Electronically signed by Ida Rogue MD Signature Date/Time: 06/26/2019/7:28:13 PM    Final    [place] EKG SITE RITE  Result Date: 06/26/2019 If Site Rite image not attached,  placement could not be confirmed due to current cardiac rhythm.     ASSESSMENT/PLAN   Left lower lobe lung mass with hilar/mediastinal lymphadenopathy  -Discussed with oncology-patient would benefit from tissue diagnosis  -Patient wishes to pursue fully aggressive measures at this time -We had discussed plans for biopsy and patient wants to proceed -Discussed benefits as well as risks of procedure.  Benefits include accurate tissue diagnosis and staging as well as ability to formulate plan for treatment.  Risks of procedure include but are not limited to pneumothorax/pneumomediastinum which may require chest tube and mechanical ventilation of prolonged ICU stay, bleeding, infection and very rarely death.  Patient understands risks and wishes to proceed with planned bronchoscopy/ENB/EBUS -Discussed procedure with Dr Ivy Lynn and Annia Belt cardiopulmonary manager.   Advanced COPD  -Typical COPD care path  -DuoNebs every 6 hours while awake  -Incentive spirometer every hour while awake  -Flutter valve to help expectorate thickened phlegm  -No need for IV steroids at this time       Thank you Dr. Broadus John for allowing me to participate in the care of this patient.   Patient/Family are satisfied with care plan and all questions have been answered.   This document was prepared using Dragon voice recognition software and may include unintentional dictation errors.     Ottie Glazier, M.D.  Division of [place]

## 2019-06-28 NOTE — Evaluation (Addendum)
Clinical/Bedside Swallow Evaluation Patient Details  Name: Corey Carr MRN: [id number] Date of Birth: [birth year]  Today's Date: 06/28/2019 Time: SLP Start Time (ACUTE ONLY): 3710 SLP Stop Time (ACUTE ONLY): 1300 SLP Time Calculation (min) (ACUTE ONLY): 45 min  Past Medical History:  Past Medical History:  Diagnosis Date  . Hypertension    Past Surgical History:  Past Surgical History:  Procedure Laterality Date  . KNEE SURGERY Right 1957   HPI:  Patient is a 79 year old male was recently evaluated by his Oncologist day b/f admission.  He was noted to have significant weight loss, declining performance status, and persistent weakness and fatigue.  He was found to have a significantly elevated calcium level as well as a left lower lobe lung mass.  Patient continues to have a poor appetite.  He is also complaining of persistent rectal pain.  He has no neurologic complaints.  Pt was admitted for acute metabolic encephalopathy; Protein-calorie malnutrition, severe.  Cancer Cachexia per chart.  Pt is d/t f/u w/ Oncology for Biopsy.  Pt has had recent multiple admissions; recent Covid 05/08/2019.  He endorses little-no appetite.    Assessment / Plan / Recommendation Clinical Impression  Pt appears to present w/ grossly adequate oropharyngeal phase swallowing function w/ No overt clinical s/s of aspiration noted during/post trials. Pt appears at reduced risk for aspiration when following general aspiration precautions. Pt is Edentulous but is able to eat soft foods adequate per his report -- pt indicated difficulty swallowing large Pills. Setup support w/ po's. Pt then fed self w/ No overt s/s of aspiration noted; no decline in vocal quality or decline in respiratory status during/post trials. Oral phase appeared [facility] for bolus management, mashing/gumming of increased texture(muffin), A-P transfer and oral clearing. Pt alternated w/ liquid to aid clearing. OM exam appeared [facility] w/ no unilateral  weakness noted. Pt did Not c/o of any Esophageal phase dysmotility when eating/drinking, even w/ muffin. He swallowed a capsule Pill w/ NSG w/ liquid adequately w/ no complaints. Discussed options of altering other Pills as needed for ease of swallowing: Crushing, Liquid or Dissolvable forms. Also discussed need to fully mash and break down any solid foods b/f swallowing -- encouraged more soups, purees in diet for ease. Recommend a more Mech Soft diet consistency(meats cut, moist foods) w/ thin liquids w/ general aspiration precautions; REFLUX precautions. Recommend Pills in puree w/ altered forms for ease of swallowing as needed. No further skilled ST services indicated at this time as pt appears at his baseline. Recommend Dietician consult and f/u for support. NSG updated.  SLP Visit Diagnosis: Dysphagia, unspecified (R13.10)    Aspiration Risk  Risk for inadequate nutrition/hydration(reduced following general precautions)    Diet Recommendation  Mech Soft diet w/ more purees in diet; Thin liquids. General aspiration precautions.  Medication Administration: Crushed with puree(or in liquid or dissolvable form)    Other  Recommendations Recommended Consults: (Dietician f/u for support - drink supplement form best) Oral Care Recommendations: Oral care BID;Oral care before and after PO;Patient independent with oral care Other Recommendations: (n/a)   Follow up Recommendations None      Frequency and Duration (n/a)  (n/a)       Prognosis Prognosis for Safe Diet Advancement: Fair Barriers to Reach Goals: Time post onset;Severity of deficits(Baseline Lung Ca; FTT)      Swallow Study   General Date of Onset: 06/25/19 HPI: Patient is a 79 year old male was recently evaluated by his Oncologist day b/f admission.  He was  noted to have significant weight loss, declining performance status, and persistent weakness and fatigue.  He was found to have a significantly elevated calcium level as well as  a left lower lobe lung mass.  Patient continues to have a poor appetite.  He is also complaining of persistent rectal pain.  He has no neurologic complaints.  Pt was admitted for acute metabolic encephalopathy; Protein-calorie malnutrition, severe.  Pt is d/t f/u w/ Oncology for Biopsy.  Pt has had recent multiple admissions; recent Covid 05/08/2019.  He endorses little-no appetite.  Type of Study: Bedside Swallow Evaluation Previous Swallow Assessment: none Diet Prior to this Study: Regular;Thin liquids Temperature Spikes Noted: No(wbc 14.7) Respiratory Status: Nasal cannula(2L) History of Recent Intubation: No Behavior/Cognition: Alert;Cooperative;Pleasant mood Oral Cavity Assessment: Within Functional Limits Oral Care Completed by SLP: Recent completion by staff Oral Cavity - Dentition: Edentulous Vision: Functional for self-feeding Self-Feeding Abilities: Able to feed self;Needs set up(min) Patient Positioning: Upright in bed(sat self up fully) Baseline Vocal Quality: Normal Volitional Cough: Strong Volitional Swallow: Able to elicit    Oral/Motor/Sensory Function Overall Oral Motor/Sensory Function: Within functional limits   Ice Chips Ice chips: Not tested   Thin Liquid Thin Liquid: Within functional limits Presentation: Cup;Self Fed;Straw(8-9 sips, then 3 multiple swallows(trial) total) Other Comments: boost juice, water; gingerale    Nectar Thick Nectar Thick Liquid: Not tested   Honey Thick Honey Thick Liquid: Not tested   Puree Puree: Within functional limits Presentation: Self Fed;Spoon(2 trials)   Solid     Solid: Within functional limits(grossly) Presentation: Self Fed;Spoon(6 bites of blueberry muffin)       Orinda Kenner, MS, CCC-SLP Corey Carr 06/28/2019,2:37 PM

## 2019-06-28 NOTE — Plan of Care (Signed)
  Problem: Education: Goal: Knowledge of General Education information will improve Description: Including pain rating scale, medication(s)/side effects and non-pharmacologic comfort measures Outcome: Progressing   Problem: Health Behavior/Discharge Planning: Goal: Ability to manage health-related needs will improve Outcome: Not Progressing Note: Patient scheduled for a bronchoscopy on Monday, to be consented and orders released tomorrow. Now on PO antibiotics and Amiodarone. Tolerating well. Will continue to monitor overall progression for the remainder of the shift. Wenda Low [facility]

## 2019-06-28 NOTE — Progress Notes (Signed)
AM labs (CBC and CMP) drawn from PICC without difficulty by this RN per protocol.

## 2019-06-28 NOTE — Progress Notes (Signed)
PHARMACIST - PHYSICIAN COMMUNICATION DR:  Domenic Polite CONCERNING: Antibiotic IV to Oral Route Change Policy  RECOMMENDATION: This patient is receiving Azithromycin by the intravenous route.  Based on criteria approved by the Pharmacy and Therapeutics Committee, the antibiotic(s) is/are being converted to the equivalent oral dose form(s).   DESCRIPTION: These criteria include:  Patient being treated for a respiratory tract infection, urinary tract infection, cellulitis or clostridium difficile associated diarrhea if on metronidazole  The patient is not neutropenic and does not exhibit a GI malabsorption state  The patient is eating (either orally or via tube) and/or has been taking other orally administered medications for a least 24 hours  The patient is improving clinically and has a Tmax < 100.5  If you have questions about this conversion, please contact the [place], PharmD, BCPS Clinical Pharmacist 06/28/2019 10:39 AM

## 2019-06-29 ENCOUNTER — Other Ambulatory Visit: Payer: Self-pay

## 2019-06-29 DIAGNOSIS — I480 Paroxysmal atrial fibrillation: Secondary | ICD-10-CM

## 2019-06-29 LAB — COMPREHENSIVE METABOLIC PANEL
ALT: 45 U/L — ABNORMAL HIGH (ref 0–44)
AST: 26 U/L (ref 15–41)
Albumin: 2.2 g/dL — ABNORMAL LOW (ref 3.5–5.0)
Alkaline Phosphatase: 64 U/L (ref 38–126)
Anion gap: 10 (ref 5–15)
BUN: 13 mg/dL (ref 8–23)
CO2: 21 mmol/L — ABNORMAL LOW (ref 22–32)
Calcium: 7.3 mg/dL — ABNORMAL LOW (ref 8.9–10.3)
Chloride: 106 mmol/L (ref 98–111)
Creatinine, Ser: 0.74 mg/dL (ref 0.61–1.24)
GFR calc Af Amer: >60 mL/min (ref >60)
GFR calc non Af Amer: >60 mL/min (ref >60)
Glucose, Bld: 99 mg/dL (ref 70–99)
Potassium: 3.9 mmol/L (ref 3.5–5.1)
Sodium: 137 mmol/L (ref 135–145)
Total Bilirubin: 0.6 mg/dL (ref 0.3–1.2)
Total Protein: 5.4 g/dL — ABNORMAL LOW (ref 6.5–8.1)

## 2019-06-29 LAB — GLUCOSE, CAPILLARY
Glucose-Capillary: 91 mg/dL (ref 70–99)
Glucose-Capillary: 84 mg/dL (ref 70–99)
Glucose-Capillary: 69 mg/dL — ABNORMAL LOW (ref 70–99)
Glucose-Capillary: 126 mg/dL — ABNORMAL HIGH (ref 70–99)
Glucose-Capillary: 100 mg/dL — ABNORMAL HIGH (ref 70–99)

## 2019-06-29 LAB — CBC
HCT: 26.5 % — ABNORMAL LOW (ref 39.0–52.0)
Hemoglobin: 8.6 g/dL — ABNORMAL LOW (ref 13.0–17.0)
MCH: 27 pg (ref 26.0–34.0)
MCHC: 32.5 g/dL (ref 30.0–36.0)
MCV: 83.1 fL (ref 80.0–100.0)
Platelets: 618 10*3/uL — ABNORMAL HIGH (ref 150–400)
RBC: 3.19 MIL/uL — ABNORMAL LOW (ref 4.22–5.81)
RDW: 18.1 % — ABNORMAL HIGH (ref 11.5–15.5)
WBC: 15.4 10*3/uL — ABNORMAL HIGH (ref 4.0–10.5)
nRBC: 0 % (ref 0.0–0.2)

## 2019-06-29 LAB — PHOSPHORUS: Phosphorus: 1.6 mg/dL — ABNORMAL LOW (ref 2.5–4.6)

## 2019-06-29 MED ORDER — TRAZODONE HCL 50 MG PO TABS
50.0000 mg | ORAL_TABLET | Freq: Every evening | ORAL | Status: DC | PRN
  Administered 2019-06-29: 50 mg via ORAL
  Filled 2019-06-29: qty 1

## 2019-06-29 MED ORDER — SIMETHICONE 80 MG PO CHEW
80.0000 mg | CHEWABLE_TABLET | Freq: Once | ORAL | Status: AC
  Administered 2019-06-29: 80 mg via ORAL
  Filled 2019-06-29: qty 1

## 2019-06-29 MED ORDER — POTASSIUM PHOSPHATE MONOBASIC 500 MG PO TABS
500.0000 mg | ORAL_TABLET | Freq: Three times a day (TID) | ORAL | Status: AC
  Administered 2019-06-29 (×3): 500 mg via ORAL
  Filled 2019-06-29 (×3): qty 1

## 2019-06-29 NOTE — Plan of Care (Signed)
  Problem: Education: Goal: Knowledge of General Education information will improve Description: Including pain rating scale, medication(s)/side effects and non-pharmacologic comfort measures Outcome: Progressing   Problem: Clinical Measurements: Goal: Diagnostic test results will improve Outcome: Progressing   

## 2019-06-29 NOTE — Progress Notes (Signed)
Patient ID: Corey Carr, male   DOB: 1940/06/29, 79 y.o.   MRN: [id number] [facility] PROGRESS NOTE  NAI DASCH MWU:[id number] DOB: Nov 14, 1940 DOA: 06/25/2019 PCP: [name], MD  HPI/Subjective: Patient feels okay.  He has had 50 pound weight loss over short period of time.  States he is eating a little bit better.  Hoping to go home tomorrow after the procedure.  Objective: Vitals:   06/29/19 0733 06/29/19 1144  BP: 122/70 90/76  Pulse: 78 71  Resp: 16 16  Temp: 97.7 F (36.5 C) 97.9 F (36.6 C)  SpO2: 100% 93%    Intake/Output Summary (Last 24 hours) at 06/29/2019 1240 Last data filed at 06/29/2019 1100 Gross per 24 hour  Intake --  Output 900 ml  Net -900 ml   Filed Weights   06/26/19 0000 06/26/19 1400 06/28/19 0252  Weight: 39.6 kg 41.8 kg 44 kg    ROS: Review of Systems  Constitutional: Positive for weight loss. Negative for chills and fever.  Eyes: Negative for blurred vision.  Respiratory: Positive for shortness of breath. Negative for cough.   Cardiovascular: Negative for chest pain.  Gastrointestinal: Negative for abdominal pain, constipation, diarrhea, nausea and vomiting.  Genitourinary: Negative for dysuria.  Musculoskeletal: Negative for joint pain.  Neurological: Negative for dizziness and headaches.   Exam: Physical Exam  Constitutional: He is oriented to person, place, and time.  HENT:  Nose: No mucosal edema.  Mouth/Throat: No oropharyngeal exudate or posterior oropharyngeal edema.  Eyes: Pupils are equal, round, and reactive to light. Conjunctivae, EOM and lids are normal.  Neck: Carotid bruit is not present.  Cardiovascular: S1 normal and S2 normal. Exam reveals no gallop.  No murmur heard. Respiratory: No respiratory distress. He has decreased breath sounds in the right lower field and the left lower field. He has no wheezes. He has no rhonchi. He has no rales.  GI: Soft. Bowel sounds are normal. There is no abdominal  tenderness.  Musculoskeletal:     Right ankle: No swelling.     Left ankle: No swelling.  Lymphadenopathy:    He has no cervical adenopathy.  Neurological: He is alert and oriented to person, place, and time. No cranial nerve deficit.  Skin: Skin is warm. No rash noted. Nails show no clubbing.  Psychiatric: He has a normal mood and affect.      Data Reviewed: Basic Metabolic Panel: Recent Labs  Lab 06/25/19 1245 06/26/19 0404 06/27/19 0356 06/28/19 0442 06/29/19 0432  NA 136 136 139 137 137  K 4.1 3.9 3.4* 4.1 3.9  CL 103 106 110 108 106  CO2 22 21* 21* 22 21*  GLUCOSE 195* 104* 105* 93 99  BUN 31* 25* 14 16 13   CREATININE 1.12 0.84 0.68 0.68 0.74  CALCIUM 8.6* 8.0* 7.2* 7.3* 7.3*  MG  --  1.7  --   --   --   PHOS  --  1.1* 1.8* 1.5* 1.6*   Liver Function Tests: Recent Labs  Lab 06/24/19 1030 06/25/19 1245 06/26/19 0404 06/28/19 0442 06/29/19 0432  AST 18 19 22  32 26  ALT 17 17 20  53* 45*  ALKPHOS 85 82 68 71 64  BILITOT 0.8 0.7 1.0 0.9 0.6  PROT 6.9 6.9 5.9* 5.6* 5.4*  ALBUMIN 3.1* 3.0* 2.6* 2.2* 2.2*   CBC: Recent Labs  Lab 06/24/19 1030 06/24/19 1030 06/25/19 1245 06/26/19 0404 06/27/19 0356 06/28/19 0442 06/29/19 0432  WBC 15.8*   < > 19.0* 14.8* 13.8*  14.7* 15.4*  NEUTROABS 12.1*  --   --   --   --   --   --   HGB 10.3*   < > 10.1* 9.0* 7.6* 8.3* 8.6*  HCT 31.9*   < > 30.0* 26.8* 22.6* 24.5* 26.5*  MCV 82.0   < > 80.2 81.0 81.0 80.6 83.1  PLT 643*   < > 719* 615* 521* 573* 618*   < > = values in this interval not displayed.    CBG: Recent Labs  Lab 06/28/19 1623 06/28/19 2109 06/29/19 0331 06/29/19 0735 06/29/19 1146  GLUCAP 151* 115* 91 84 69*    Recent Results (from the past 240 hour(s))  Blood culture (routine x 2)     Status: None (Preliminary result)   Collection Time: 06/25/19  8:58 PM   Specimen: BLOOD  Result Value Ref Range Status   Specimen Description BLOOD LEFT ASSIST CONTROL  Final   Special Requests   Final     BOTTLES DRAWN AEROBIC AND ANAEROBIC Blood Culture adequate volume   Culture   Final    NO GROWTH 4 DAYS Performed at [facility], [address] [place], [place] [address]    Report Status PENDING  Incomplete  Blood culture (routine x 2)     Status: None (Preliminary result)   Collection Time: 06/25/19  8:58 PM   Specimen: BLOOD  Result Value Ref Range Status   Specimen Description BLOOD LEFT ASSIST CONTROL  Final   Special Requests   Final    BOTTLES DRAWN AEROBIC AND ANAEROBIC Blood Culture results may not be optimal due to an excessive volume of blood received in culture bottles   Culture   Final    NO GROWTH 4 DAYS Performed at [facility], [address], [place], [place] [address]    Report Status PENDING  Incomplete  MRSA PCR Screening     Status: None   Collection Time: 06/26/19  2:06 PM   Specimen: Nasal Mucosa; Nasopharyngeal  Result Value Ref Range Status   MRSA by PCR NEGATIVE NEGATIVE Final    Comment:        The GeneXpert MRSA Assay (FDA approved for NASAL specimens only), is one component of a comprehensive MRSA colonization surveillance program. It is not intended to diagnose MRSA infection nor to guide or monitor treatment for MRSA infections. Performed at [facility], [address] [place],  [address]       Scheduled Meds: . ALPRAZolam  0.5 mg Oral QPM  . azithromycin  500 mg Oral Daily  . butamben-tetracaine-benzocaine  1 spray Topical Once  . cefdinir  300 mg Oral Q12H  . Chlorhexidine Gluconate Cloth  6 each Topical Daily  . feeding supplement (ENSURE ENLIVE)  237 mL Oral TID BM  . fluticasone furoate-vilanterol  1 puff Inhalation q morning - 10a   And  . umeclidinium bromide  1 puff Inhalation q morning - 10a  . lidocaine  1 application Topical Once  . multivitamin with minerals  1 tablet Oral Daily  . phosphorus  500 mg Oral Once  . potassium phosphate (monobasic)  500 mg Oral TID WC & HS  . sodium  chloride flush  10 mL Intravenous Q12H   Continuous Infusions: . sodium chloride Stopped (06/28/19 0218)    Assessment/Plan:  1. Postobstructive pneumonia.  Received IV cefepime and Zithromax and will be over to oral cefdinir for few more days. 2. Lung mass left lower lobe.  Weight loss.  For bronchoscopy tomorrow.  N.p.o. after midnight.  This is concerning for lung cancer.  Patient also has pneumomediastinum seen on recent CT scan of the chest. 3. Hypoglycemia secondary to infection malnutrition and poor appetite.  Weaned off D5 infusion. 4. Atrial fibrillation with rapid ventricular response.  Converted over to normal sinus rhythm.  Not a good anticoagulation candidate at this time.  Continue amiodarone 5. Hypercalcemia likely from malignancy.  Treated with Zometa 6. Severe protein calorie malnutrition 7. COPD.  Respiratory status stable.  See if we can taper off oxygen.  Code Status:     Code Status Orders  (From admission, onward)         Start     Ordered   06/27/19 0827  Do not attempt resuscitation (DNR)  Continuous    Question Answer Comment  In the event of cardiac or respiratory ARREST Do not call a "code blue"   In the event of cardiac or respiratory ARREST Do not perform Intubation, CPR, defibrillation or ACLS   In the event of cardiac or respiratory ARREST Use medication by any route, position, wound care, and other measures to relive pain and suffering. May use oxygen, suction and manual treatment of airway obstruction as needed for comfort.      06/27/19 0826        Code Status History    Date Active Date Inactive Code Status Order ID Comments User Context   06/25/2019 2208 06/27/2019 0826 Full Code [id number]  Toy Baker, MD ED   06/18/2019 0011 06/19/2019 2110 DNR [id number]  Athena Masse, MD ED   06/17/2019 2244 06/18/2019 0011 Full Code [id number]  Athena Masse, MD ED   05/23/2019 1509 05/24/2019 1703 Full Code [id number]  Edwin Dada, MD ED    05/08/2019 2323 05/12/2019 2244 Full Code [id number]  Mansy, Arvella Merles, MD ED   Advance Care Planning Activity    Advance Directive Documentation     Most Recent Value  Type of Advance Directive  Healthcare Power of Attorney  Pre-existing out of facility DNR order (yellow form or pink MOST form)  --  "MOST" Form in Place?  --     Family Communication: Spoke with the patient's wife on the phone Disposition Plan: Evaluate status after bronchoscopy for tomorrow.  Home with home health either Monday afternoon or Tuesday morning depending on how he does with the bronchoscopy.  Consultants:  Pulmonary  Cardiology  Speech therapy  Oncology  Antibiotics:  Cefdinir  Time spent: 28 minutes  Quincie Haroon [employer]

## 2019-06-29 NOTE — Progress Notes (Signed)
Notified by nurse tech that blood sugar was 69, gave pt 8oz of Orange juice and a vanilla ensure, pt is asymptomatic

## 2019-06-29 NOTE — Plan of Care (Signed)
°  Problem: Activity: °Goal: Risk for activity intolerance will decrease °Outcome: Progressing °  °Problem: Nutrition: °Goal: Adequate nutrition will be maintained °Outcome: Progressing °  °Problem: Skin Integrity: °Goal: Risk for impaired skin integrity will decrease °Outcome: Progressing °  °

## 2019-06-29 NOTE — Progress Notes (Signed)
Progress Note  Patient Name: Corey Carr Date of Encounter: 06/29/2019  Primary Cardiologist: [facility]  Subjective   Doing well. No chest pain. Feels breathing is good. Sugar a little low this AM but feels better after drinking something. Aware of the bronch tomorrow. No other concerns at this time.  Inpatient Medications    Scheduled Meds: . ALPRAZolam  0.5 mg Oral QPM  . azithromycin  500 mg Oral Daily  . butamben-tetracaine-benzocaine  1 spray Topical Once  . cefdinir  300 mg Oral Q12H  . Chlorhexidine Gluconate Cloth  6 each Topical Daily  . feeding supplement (ENSURE ENLIVE)  237 mL Oral TID BM  . fluticasone furoate-vilanterol  1 puff Inhalation q morning - 10a   And  . umeclidinium bromide  1 puff Inhalation q morning - 10a  . lidocaine  1 application Topical Once  . multivitamin with minerals  1 tablet Oral Daily  . phosphorus  500 mg Oral Once  . potassium phosphate (monobasic)  500 mg Oral TID WC & HS  . sodium chloride flush  10 mL Intravenous Q12H   Continuous Infusions: . sodium chloride Stopped (06/28/19 0218)   PRN Meds: sodium chloride, acetaminophen **OR** acetaminophen, albuterol, HYDROcodone-acetaminophen, ondansetron **OR** ondansetron (ZOFRAN) IV   Vital Signs    Vitals:   06/28/19 1944 06/29/19 0333 06/29/19 0733 06/29/19 1144  BP: 117/70 121/68 122/70 90/76  Pulse: 74 73 78 71  Resp: 16 18 16 16   Temp: 98.1 F (36.7 C) 98.3 F (36.8 C) 97.7 F (36.5 C) 97.9 F (36.6 C)  TempSrc: Oral Oral Oral Oral  SpO2: 100% 100% 100% 93%  Weight:      Height:        Intake/Output Summary (Last 24 hours) at 06/29/2019 1327 Last data filed at 06/29/2019 1100 Gross per 24 hour  Intake --  Output 900 ml  Net -900 ml   Last 3 Weights 06/28/2019 06/26/2019 06/26/2019  Weight (lbs) 96 lb 14.4 oz 92 lb 2.4 oz 87 lb 4.8 oz  Weight (kg) 43.954 kg 41.8 kg 39.6 kg      Telemetry    Continues in sinus rhythm- personally Reviewed  ECG    No new since  1/26- Personally Reviewed  Physical Exam   GEN: cachectic, in no acute distress NECK: No JVD CARDIAC: regular rhythm, normal S1 and S2, no rubs or gallops. No murmur. VASCULAR: Radial pulses 2+ bilaterally.  RESPIRATORY:  Largely clear with mild scattered coarseness ABDOMEN: Soft, non-tender, non-distended MUSCULOSKELETAL:  Moves all 4 limbs independently SKIN: Warm and dry, no edema NEUROLOGIC:  Alert and oriented x 3. No focal neuro deficits noted. PSYCHIATRIC:  Normal affect   Labs    High Sensitivity Troponin:   Recent Labs  Lab 06/17/19 1511  TROPONINIHS 29*      Chemistry Recent Labs  Lab 06/26/19 0404 06/26/19 0404 06/27/19 0356 06/28/19 0442 06/29/19 0432  NA 136   < > 139 137 137  K 3.9   < > 3.4* 4.1 3.9  CL 106   < > 110 108 106  CO2 21*   < > 21* 22 21*  GLUCOSE 104*   < > 105* 93 99  BUN 25*   < > 14 16 13   CREATININE 0.84   < > 0.68 0.68 0.74  CALCIUM 8.0*   < > 7.2* 7.3* 7.3*  PROT 5.9*  --   --  5.6* 5.4*  ALBUMIN 2.6*  --   --  2.2*  2.2*  AST 22  --   --  32 26  ALT 20  --   --  53* 45*  ALKPHOS 68  --   --  71 64  BILITOT 1.0  --   --  0.9 0.6  GFRNONAA >60   < > >60 >60 >60  GFRAA >60   < > >60 >60 >60  ANIONGAP 9   < > 8 7 10    < > = values in this interval not displayed.     Hematology Recent Labs  Lab 06/27/19 0356 06/28/19 0442 06/29/19 0432  WBC 13.8* 14.7* 15.4*  RBC 2.79* 3.04* 3.19*  HGB 7.6* 8.3* 8.6*  HCT 22.6* 24.5* 26.5*  MCV 81.0 80.6 83.1  MCH 27.2 27.3 27.0  MCHC 33.6 33.9 32.5  RDW 18.0* 18.2* 18.1*  PLT 521* 573* 618*    BNPNo results for input(s): BNP, PROBNP in the last 168 hours.   DDimer No results for input(s): DDIMER in the last 168 hours.   Radiology    No results found.  Cardiac Studies  Echocardiogram performed yesterday June 26, 2019 1. Left ventricular ejection fraction, by visual estimation, is 50 to  55%. The left ventricle has normal function. There is moderately increased  left  ventricular hypertrophy.  2. Left ventricular diastolic parameters are indeterminate.  3. The left ventricle has no regional wall motion abnormalities.  4. Global right ventricle has normal systolic function.The right  ventricular size is normal. No increase in right ventricular wall  thickness.  5. Left atrial size was normal.  6. Tricuspid valve regurgitation is mild-moderate.  7. Mildly elevated pulmonary artery systolic pressure.  8. Tachycardia noted/atrial flutter, rate 130 bpm    Patient Profile     Corey Carr is a 79 y.o. male with a hx of lung mass, suspected lung cancer, COPD, hypertension, Covid pneumonia December 2020, hospitalized January 2021 for hypercalcemia, dehydration, renal failure, anorexia, hypoglycemia.    During hospitalization, went into atrial flutter with RVR.  Assessment & Plan    Atrial flutter with RVR: continues to remain in sinus rhythm today. -has remained in sinus rhythm since initiation of oral amiodarone -given anemia, pending procedure, frailty, anticoagulation was not started. -with possible lung cancer, he is at risk of atrial arrhythmia.  -he is tolerating 200 mg BID amiodarone. Can drop to 200 mg daily at outpatient follow up. Will need LFTs, TFTs followed. Pulm involvement may be difficult to determine given his comorbidities. -no changes to regimen today. Will see how he does with his bronch tomorrow and if that triggers further arrhythmia  Lung mass/cancer Work-up per Dr. Katherina Right team  Per primary team: Anemia of chronic disease Failure to thrive Protein calorie malnutrition, severe.   For questions or updates, please contact [place] Please consult [email or web address] for contact info under     Signed, Buford Dresser, MD  06/29/2019, 1:27 PM

## 2019-06-29 NOTE — Progress Notes (Signed)
Pt c/o chest pain, upon assessment pt pointed to right upper abd/ right lower rib cage area. obtaining 12 lead, pts O2 was in the 80s stated he was a bit SOB, did not appear in distress and breathing was unlabored  titrated up to 4L, pt at 92%. MD notified, asked how abd felt, upon assessment pts abd had movement. See MD orders.

## 2019-06-29 NOTE — Progress Notes (Signed)
SATURATION QUALIFICATIONS: (This note is used to comply with regulatory documentation for home oxygen)  Patient Saturations on Room Air at Rest = 98%  Patient Saturations on Room Air while Ambulating = 79%  Patient Saturations on 4 Liters of oxygen while Ambulating = 95%  Please briefly explain why patient needs home oxygen: Pts O2% dropped to 88% while standing up from bed and required two liters to get him back up to 95% Pt was unable to tolerate ambulation without further desating into the 70s and required 4L to get him back up to 95%.

## 2019-06-29 NOTE — Progress Notes (Signed)
Pulmonary Medicine          Date: 06/29/2019,   MRN# [id number] Corey Carr [birth year]     AdmissionWeight: 43.1 kg                 CurrentWeight: 44 kg      CHIEF COMPLAINT:   Left lung mass with hilar mediastinal lymphadenopathy   SUBJECTIVE   Patient states he slept well.  He had normal BM this morning.  He is using IS at bedside can take tidal volume up to 982ml.    He is breathing well with 1-2L/min J[place].  No chest pain, cough, discomfort.    PAST MEDICAL HISTORY   Past Medical History:  Diagnosis Date  . Hypertension      SURGICAL HISTORY   Past Surgical History:  Procedure Laterality Date  . KNEE SURGERY Right 1957     FAMILY HISTORY   Family History  Problem Relation Age of Onset  . Emphysema Father   . Lung cancer Maternal Uncle      SOCIAL HISTORY   Social History   Tobacco Use  . Smoking status: Former Smoker    Packs/day: 0.50    Years: 60.00    Pack years: 30.00    Types: Cigarettes    Quit date: 2015    Years since quitting: 6.1  . Smokeless tobacco: Never Used  Substance Use Topics  . Alcohol use: No  . Drug use: No     MEDICATIONS    Home Medication:  Current Outpatient Rx  . Order #: [id number] Class: Normal  . Order #: [id number] Class: Normal    Current Medication:  Current Facility-Administered Medications:  .  0.9 %  sodium chloride infusion, , Intravenous, PRN, Domenic Polite, MD, Stopped at 06/28/19 [phone] .  acetaminophen (TYLENOL) tablet 650 mg, 650 mg, Oral, Q6H PRN **OR** acetaminophen (TYLENOL) suppository 650 mg, 650 mg, Rectal, Q6H PRN, Doutova, Anastassia, MD .  albuterol (VENTOLIN HFA) 108 (90 Base) MCG/ACT inhaler 2 puff, 2 puff, Inhalation, Q3H PRN, Doutova, Anastassia, MD .  ALPRAZolam Duanne Moron) tablet 0.5 mg, 0.5 mg, Oral, QPM, Mycala Warshawsky, MD, 0.5 mg at 06/28/19 1705 .  azithromycin (ZITHROMAX) tablet 500 mg, 500 mg, Oral, Daily, Lu Duffel, RPH, 500 mg at 06/28/19 2144 .   butamben-tetracaine-benzocaine (CETACAINE) spray 1 spray, 1 spray, Topical, Once, Ezeriah Luty, MD .  cefdinir (OMNICEF) capsule 300 mg, 300 mg, Oral, Q12H, Domenic Polite, MD, 300 mg at 06/29/19 0855 .  Chlorhexidine Gluconate Cloth 2 % PADS 6 each, 6 each, Topical, Daily, Domenic Polite, MD, 6 each at 06/27/19 1000 .  feeding supplement (ENSURE ENLIVE) (ENSURE ENLIVE) liquid 237 mL, 237 mL, Oral, TID BM, Domenic Polite, MD, 237 mL at 06/27/19 2146 .  fluticasone furoate-vilanterol (BREO ELLIPTA) 100-25 MCG/INH 1 puff, 1 puff, Inhalation, q morning - 10a, 1 puff at 06/29/19 0856 **AND** umeclidinium bromide (INCRUSE ELLIPTA) 62.5 MCG/INH 1 puff, 1 puff, Inhalation, q morning - 10a, Hall, Scott A, RPH, 1 puff at 06/29/19 0855 .  HYDROcodone-acetaminophen (NORCO/VICODIN) 5-325 MG per tablet 1-2 tablet, 1-2 tablet, Oral, Q4H PRN, Doutova, Anastassia, MD .  lidocaine (XYLOCAINE) 2 % jelly 1 application, 1 application, Topical, Once, Zadok Holaway, MD .  multivitamin with minerals tablet 1 tablet, 1 tablet, Oral, Daily, Domenic Polite, MD, 1 tablet at 06/29/19 0854 .  ondansetron (ZOFRAN) tablet 4 mg, 4 mg, Oral, Q6H PRN **OR** ondansetron (ZOFRAN) injection 4 mg, 4 mg, Intravenous, Q6H PRN, Toy Baker, MD .  phosphorus (K PHOS NEUTRAL) tablet 500 mg, 500 mg, Oral, Once, Domenic Polite, MD .  potassium phosphate (monobasic) (K-PHOS ORIGINAL) tablet 500 mg, 500 mg, Oral, TID WC & HS, Leslye Peer, Richard, MD, 500 mg at 06/29/19 0855 .  sodium chloride flush (NS) 0.9 % injection 10 mL, 10 mL, Intravenous, Q12H, Domenic Polite, MD, 10 mL at 06/29/19 0855    ALLERGIES   Patient has no known allergies.     REVIEW OF SYSTEMS    Review of Systems:  Gen:  Denies  fever, sweats, chills weigh loss  HEENT: Denies blurred vision, double vision, ear pain, eye pain, hearing loss, nose bleeds, sore throat Cardiac:  No dizziness, chest pain or heaviness, chest tightness,edema Resp:   Denies  cough or sputum porduction, shortness of breath,wheezing, hemoptysis,  Gi: Denies swallowing difficulty, stomach pain, nausea or vomiting, diarrhea, constipation, bowel incontinence Gu:  Denies bladder incontinence, burning urine Ext:   Denies Joint pain, stiffness or swelling Skin: Denies  skin rash, easy bruising or bleeding or hives Endoc:  Denies polyuria, polydipsia , polyphagia or weight change Psych:   Denies depression, insomnia or hallucinations   Other:  All other systems negative   VS: BP 122/70 (BP Location: Left Arm)   Pulse 78   Temp 97.7 F (36.5 C) (Oral)   Resp 16   Ht 5\' 8"  (1.727 m)   Wt 44 kg   SpO2 100%   BMI 14.73 kg/m      PHYSICAL EXAM    GENERAL:NAD, no fevers, chills, no weakness no fatigue HEAD: Normocephalic, atraumatic.  EYES: Pupils equal, round, reactive to light. Extraocular muscles intact. No scleral icterus.  MOUTH: Moist mucosal membrane. Dentition intact. No abscess noted.  EAR, NOSE, THROAT: Clear without exudates. No external lesions.  NECK: Supple. No thyromegaly. No nodules. No JVD.  PULMONARY: decreased air entry bilaterally CARDIOVASCULAR: S1 and S2. Regular rate and rhythm. No murmurs, rubs, or gallops. No edema. Pedal pulses 2+ bilaterally.  GASTROINTESTINAL: Soft, nontender, nondistended. No masses. Positive bowel sounds. No hepatosplenomegaly.  MUSCULOSKELETAL: No swelling, clubbing, or edema. Range of motion full in all extremities.  NEUROLOGIC: Cranial nerves II through XII are intact. No gross focal neurological deficits. Sensation intact. Reflexes intact.  SKIN: No ulceration, lesions, rashes, or cyanosis. Skin warm and dry. Turgor intact.  PSYCHIATRIC: Mood, affect within normal limits. The patient is awake, alert and oriented x 3. Insight, judgment intact.       IMAGING    DG Chest 2 View  Result Date: 06/17/2019 CLINICAL DATA:  Dizziness and shortness of breath. EXAM: CHEST - 2 VIEW COMPARISON:  05/23/2019.  FINDINGS: Similar appearance of volume loss in the left hemithorax with elevation of the left hemidiaphragm and left base collapse/consolidative opacity. There is diffuse interstitial and pleural opacity in the left chest. Right lung is hyperexpanded without focal consolidation or pleural effusion. Bones are diffusely demineralized. IMPRESSION: Largely stable exam. Marked volume loss left hemithorax with posterior collapse/consolidative change in diffuse pleural thickening. Electronically Signed   By: Misty Stanley M.D.   On: 06/17/2019 16:43   CT Angio Chest PE W/Cm &/Or Wo Cm  Result Date: 06/17/2019 CLINICAL DATA:  Shortness of breath EXAM: CT ANGIOGRAPHY CHEST WITH CONTRAST TECHNIQUE: Multidetector CT imaging of the chest was performed using the standard protocol during bolus administration of intravenous contrast. Multiplanar CT image reconstructions and MIPs were obtained to evaluate the vascular anatomy. CONTRAST:  73mL OMNIPAQUE IOHEXOL 350 MG/ML SOLN COMPARISON:  03/06/2019 FINDINGS: Cardiovascular: No filling  defects in the pulmonary arteries to suggest pulmonary emboli. Heart is normal size. Aorta normal caliber. Aortic and coronary artery atherosclerosis. Mediastinum/Nodes: There is pneumomediastinum. Gas seen around the main pulmonary artery and in the posterior mediastinum. No adenopathy. Lungs/Pleura: Volume loss on the left with elevation of the left hemidiaphragm. Findings are chronic. There is a chronic masslike area of consolidation in the left lower lobe which is similar to prior study. Advanced centrilobular emphysema and areas of scarring in both lungs. Possible small left effusion, stable. Upper Abdomen: Imaging into the upper abdomen shows no acute findings. Musculoskeletal: Cachexia.  No acute bony abnormality. Review of the MIP images confirms the above findings. IMPRESSION: Pneumomediastinum seen in the middle and posterior mediastinum. No associated pneumothorax. Advanced  centrilobular emphysema. Stable chronic volume loss on the left with elevation of the left hemidiaphragm and masslike consolidation in the left lower lobe. As recommended on prior study, bronchoscopy with attention to the left lower lobe and/or PET CT may be helpful to exclude left lower lobe mass lesion. Small right pleural effusion. Areas of scarring in the lungs bilaterally. Aortic Atherosclerosis (ICD10-I70.0) and Emphysema (ICD10-J43.9). Electronically Signed   By: Rolm Baptise M.D.   On: 06/17/2019 21:12   CT Abdomen Pelvis W Contrast  Result Date: 06/17/2019 CLINICAL DATA:  Hypercalcemia. EXAM: CT ABDOMEN AND PELVIS WITH CONTRAST TECHNIQUE: Multidetector CT imaging of the abdomen and pelvis was performed using the standard protocol following bolus administration of intravenous contrast. CONTRAST:  44mL OMNIPAQUE IOHEXOL 350 MG/ML SOLN COMPARISON:  None. FINDINGS: Lower chest: Masslike opacity seen in the left lower lobe as described on prior chest CT. Trace left pleural effusion. Volume loss on the left with elevation of the left hemidiaphragm. Advanced emphysema. Scarring in the right lower lobe. Hepatobiliary: No focal hepatic abnormality. Gallbladder unremarkable. Pancreas: Calcifications throughout the pancreas compatible with chronic pancreatitis. No evidence of acute pancreatitis, fall suspicious pancreatic lesion or pancreatic ductal dilatation. Spleen: No focal abnormality.  Normal size. Adrenals/Urinary Tract: Small cysts in the left kidney. No hydronephrosis. Adrenal glands and urinary bladder unremarkable. Stomach/Bowel: Large stool burden throughout the colon. Distention of the rectum with stool concerning for fecal impaction. Stomach and small bowel decompressed, unremarkable. Vascular/Lymphatic: Aortic atherosclerosis. No enlarged abdominal or pelvic lymph nodes. Reproductive: Prostate prominence. Other: No free fluid or free air. Musculoskeletal: Cachexia. No acute bony abnormality.  Degenerative disc and facet disease in the lower lumbar spine. IMPRESSION: Masslike opacity in the left lower lobe as described on chest CT concerning for possible pulmonary mass lesions/malignancy. This could be further evaluated with bronchoscopy and/or PET CT as described on prior chest CT. Advanced emphysema. Large stool burden throughout the colon. Appearance is concerning for fecal impaction. Chronic pancreatitis changes.  No evidence of acute pancreatitis. Aortic atherosclerosis. Electronically Signed   By: Rolm Baptise M.D.   On: 06/17/2019 21:16   DG Chest Port 1 View  Result Date: 06/25/2019 CLINICAL DATA:  Weakness, COVID-19 positive EXAM: PORTABLE CHEST 1 VIEW COMPARISON:  06/17/2019 FINDINGS: Chronic volume loss with consolidation in the left hemithorax, stable from prior. There may be mildly increased opacity within the aerated portion of the left lung. Heart size is stable. Mediastinal structures remain shifted to the left. There is hyperexpansion of the right lung field with chronic emphysematous changes and right apical scarring. No pneumothorax. IMPRESSION: Chronic volume loss with consolidation in the left hemithorax, stable from prior. There may be mildly increased opacity within the aerated portion of the left lung, which could reflect  pneumonia given the patient's history. Electronically Signed   By: Davina Poke D.O.   On: 06/25/2019 13:31   ECHOCARDIOGRAM COMPLETE  Result Date: 06/26/2019   ECHOCARDIOGRAM REPORT   Patient Name:   Corey Carr Date of Exam: 06/26/2019 Medical Rec #:  [id number]       Height:       68.0 in Accession #:    [id number]      Weight:       92.2 lb Date of Birth:  [birth year]       BSA:          1.47 m Patient Age:    56 years        BP:           89/66 mmHg Patient Gender: M               HR:           129 bpm. Exam Location:  [facility] Procedure: 2D Echo Indications:     ATRIAL FIBRILLATION 427.31/I48.91  History:         Patient has no prior history of  Echocardiogram examinations.                  COPD; Risk Factors:Hypertension.  Sonographer:     Avanell Shackleton Referring Phys:  [place] Diagnosing Phys: Ida Rogue MD  Sonographer Comments: Image acquisition challenging due to patient body habitus. IMPRESSIONS  1. Left ventricular ejection fraction, by visual estimation, is 50 to 55%. The left ventricle has normal function. There is moderately increased left ventricular hypertrophy.  2. Left ventricular diastolic parameters are indeterminate.  3. The left ventricle has no regional wall motion abnormalities.  4. Global right ventricle has normal systolic function.The right ventricular size is normal. No increase in right ventricular wall thickness.  5. Left atrial size was normal.  6. Tricuspid valve regurgitation is mild-moderate.  7. Mildly elevated pulmonary artery systolic pressure.  8. Tachycardia noted/atrial flutter, rate 130 bpm FINDINGS  Left Ventricle: Left ventricular ejection fraction, by visual estimation, is 50 to 55%. The left ventricle has normal function. The left ventricle has no regional wall motion abnormalities. There is moderately increased left ventricular hypertrophy. Left ventricular diastolic parameters are indeterminate. Normal left atrial pressure. Right Ventricle: The right ventricular size is normal. No increase in right ventricular wall thickness. Global RV systolic function is has normal systolic function. The tricuspid regurgitant velocity is 2.90 m/s, and with an assumed right atrial pressure  of 5 mmHg, the estimated right ventricular systolic pressure is mildly elevated at 38.6 mmHg. Left Atrium: Left atrial size was normal in size. Right Atrium: Right atrial size was normal in size Pericardium: There is no evidence of pericardial effusion. Mitral Valve: The mitral valve is normal in structure. Mild to moderate mitral valve regurgitation. No evidence of mitral valve stenosis by observation. Tricuspid Valve: The  tricuspid valve is normal in structure. Tricuspid valve regurgitation is mild-moderate. Aortic Valve: The aortic valve is normal in structure. Aortic valve regurgitation is not visualized. Mild to moderate aortic valve sclerosis/calcification is present, without any evidence of aortic stenosis. Pulmonic Valve: The pulmonic valve was normal in structure. Pulmonic valve regurgitation is not visualized. Pulmonic regurgitation is not visualized. Aorta: The aortic root, ascending aorta and aortic arch are all structurally normal, with no evidence of dilitation or obstruction. Venous: The inferior vena cava is normal in size with greater than 50% respiratory variability, suggesting right atrial pressure of 3  mmHg. IAS/Shunts: No atrial level shunt detected by color flow Doppler. There is no evidence of a patent foramen ovale. No ventricular septal defect is seen or detected. There is no evidence of an atrial septal defect.  LEFT VENTRICLE PLAX 2D LVIDd:         2.60 cm LVIDs:         2.26 cm LV PW:         0.68 cm LV IVS:        0.92 cm LVOT diam:     1.70 cm LV SV:         7 ml LV SV Index:   5.30 LVOT Area:     2.27 cm  LEFT ATRIUM             Index       RIGHT ATRIUM           Index LA diam:        2.70 cm 1.84 cm/m  RA Area:     16.00 cm LA Vol (A2C):   58.4 ml 39.72 ml/m RA Volume:   45.70 ml  31.08 ml/m LA Vol (A4C):   33.6 ml 22.85 ml/m LA Biplane Vol: 48.1 ml 32.71 ml/m   AORTA Ao Root diam: 3.30 cm TRICUSPID VALVE TR Peak grad:   33.6 mmHg TR Vmax:        305.00 cm/s  SHUNTS Systemic Diam: 1.70 cm  Ida Rogue MD Electronically signed by Ida Rogue MD Signature Date/Time: 06/26/2019/7:28:13 PM    Final    [place] EKG SITE RITE  Result Date: 06/26/2019 If Site Rite image not attached, placement could not be confirmed due to current cardiac rhythm.     ASSESSMENT/PLAN   Left lower lobe lung mass with hilar/mediastinal lymphadenopathy  -Discussed with oncology-patient would benefit from tissue  diagnosis  -Patient wishes to pursue fully aggressive measures at this time -We had discussed plans for biopsy and patient wants to proceed -Discussed benefits as well as risks of procedure.  Benefits include accurate tissue diagnosis and staging as well as ability to formulate plan for treatment.  Risks of procedure include but are not limited to pneumothorax/pneumomediastinum which may require chest tube and mechanical ventilation of prolonged ICU stay, bleeding, infection and very rarely death.  Patient understands risks and wishes to proceed with planned bronchoscopy/ENB/EBUS -Discussed procedure with Dr Ivy Lynn and Annia Belt cardiopulmonary manager.   Advanced COPD  -Typical COPD care path  -DuoNebs every 6 hours while awake  -Incentive spirometer every hour while awake  -Flutter valve to help expectorate thickened phlegm  -No need for IV steroids at this time       Thank you for allowing me to participate in the care of this patient.   Patient/Family are satisfied with care plan and all questions have been answered.   This document was prepared using Dragon voice recognition software and may include unintentional dictation errors.     Ottie Glazier, M.D.  Division of [facility]

## 2019-06-30 ENCOUNTER — Encounter
Admission: EM | Disposition: A | Discharge status: Home / Self Care | Payer: Self-pay | Source: Home / Self Care | Attending: Internal Medicine

## 2019-06-30 ENCOUNTER — Encounter: Payer: Self-pay | Admitting: Internal Medicine

## 2019-06-30 ENCOUNTER — Inpatient Hospital Stay: Payer: Medicare Other | Admitting: Anesthesiology

## 2019-06-30 ENCOUNTER — Inpatient Hospital Stay: Payer: Medicare Other

## 2019-06-30 ENCOUNTER — Encounter: Admission: RE | Payer: Self-pay | Source: Home / Self Care

## 2019-06-30 ENCOUNTER — Inpatient Hospital Stay: Payer: Medicare Other | Admitting: Oncology

## 2019-06-30 ENCOUNTER — Ambulatory Visit: Admission: RE | Admit: 2019-06-30 | Payer: Medicare Other | Source: Home / Self Care | Admitting: Internal Medicine

## 2019-06-30 DIAGNOSIS — J9621 Acute and chronic respiratory failure with hypoxia: Secondary | ICD-10-CM

## 2019-06-30 HISTORY — PX: VIDEO BRONCHOSCOPY WITH ENDOBRONCHIAL ULTRASOUND: SHX6177

## 2019-06-30 HISTORY — PX: VIDEO BRONCHOSCOPY WITH ENDOBRONCHIAL NAVIGATION: SHX6175

## 2019-06-30 LAB — GLUCOSE, CAPILLARY
Glucose-Capillary: 98 mg/dL (ref 70–99)
Glucose-Capillary: 54 mg/dL — ABNORMAL LOW (ref 70–99)
Glucose-Capillary: 27 mg/dL — CL (ref 70–99)
Glucose-Capillary: 50 mg/dL — ABNORMAL LOW (ref 70–99)

## 2019-06-30 LAB — BASIC METABOLIC PANEL
Anion gap: 13 (ref 5–15)
BUN: 12 mg/dL (ref 8–23)
CO2: 18 mmol/L — ABNORMAL LOW (ref 22–32)
Calcium: 7.9 mg/dL — ABNORMAL LOW (ref 8.9–10.3)
Chloride: 103 mmol/L (ref 98–111)
Creatinine, Ser: 0.71 mg/dL (ref 0.61–1.24)
GFR calc Af Amer: >60 mL/min (ref >60)
GFR calc non Af Amer: >60 mL/min (ref >60)
Glucose, Bld: 133 mg/dL — ABNORMAL HIGH (ref 70–99)
Potassium: 4.4 mmol/L (ref 3.5–5.1)
Sodium: 134 mmol/L — ABNORMAL LOW (ref 135–145)

## 2019-06-30 LAB — CBC
HCT: 31.9 % — ABNORMAL LOW (ref 39.0–52.0)
Hemoglobin: 10.3 g/dL — ABNORMAL LOW (ref 13.0–17.0)
MCH: 27.4 pg (ref 26.0–34.0)
MCHC: 32.3 g/dL (ref 30.0–36.0)
MCV: 84.8 fL (ref 80.0–100.0)
Platelets: 614 10*3/uL — ABNORMAL HIGH (ref 150–400)
RBC: 3.76 MIL/uL — ABNORMAL LOW (ref 4.22–5.81)
RDW: 18 % — ABNORMAL HIGH (ref 11.5–15.5)
WBC: 19.3 10*3/uL — ABNORMAL HIGH (ref 4.0–10.5)
nRBC: 0 % (ref 0.0–0.2)

## 2019-06-30 LAB — CULTURE, BLOOD (ROUTINE X 2)
Culture: NO GROWTH
Culture: NO GROWTH
Special Requests: ADEQUATE

## 2019-06-30 SURGERY — ESOPHAGOGASTRODUODENOSCOPY (EGD) WITH PROPOFOL
Anesthesia: General

## 2019-06-30 SURGERY — VIDEO BRONCHOSCOPY WITH ENDOBRONCHIAL NAVIGATION
Anesthesia: General

## 2019-06-30 SURGERY — BRONCHOSCOPY, FLEXIBLE
Anesthesia: General | Laterality: Bilateral

## 2019-06-30 MED ORDER — ROCURONIUM BROMIDE 100 MG/10ML IV SOLN
INTRAVENOUS | Status: DC | PRN
  Administered 2019-06-30: 45 mg via INTRAVENOUS
  Administered 2019-06-30: 5 mg via INTRAVENOUS

## 2019-06-30 MED ORDER — ONDANSETRON HCL 4 MG/2ML IJ SOLN: 4.0000 mg | Freq: Once | INTRAMUSCULAR | Status: DC | PRN

## 2019-06-30 MED ORDER — AMIODARONE HCL 200 MG PO TABS: 200.0000 mg | ORAL_TABLET | Freq: Two times a day (BID) | ORAL | 0 refills | Status: AC

## 2019-06-30 MED ORDER — AMIODARONE HCL 200 MG PO TABS: 200.0000 mg | ORAL_TABLET | Freq: Two times a day (BID) | ORAL | Status: DC

## 2019-06-30 MED ORDER — PHENYLEPHRINE HCL (PRESSORS) 10 MG/ML IV SOLN
INTRAVENOUS | Status: DC | PRN
  Administered 2019-06-30 (×4): 100 ug via INTRAVENOUS

## 2019-06-30 MED ORDER — SUGAMMADEX SODIUM 200 MG/2ML IV SOLN
INTRAVENOUS | Status: DC | PRN
  Administered 2019-06-30: 90 mg via INTRAVENOUS

## 2019-06-30 MED ORDER — ENSURE ENLIVE PO LIQD: 237.0000 mL | Freq: Three times a day (TID) | ORAL | 0 refills | Status: AC

## 2019-06-30 MED ORDER — FENTANYL CITRATE (PF) 100 MCG/2ML IJ SOLN
INTRAMUSCULAR | Status: DC | PRN
  Administered 2019-06-30 (×2): 25 ug via INTRAVENOUS

## 2019-06-30 MED ORDER — LIDOCAINE HCL URETHRAL/MUCOSAL 2 % EX GEL
1.0000 "application " | Freq: Once | CUTANEOUS | Status: DC
  Filled 2019-06-30: qty 5

## 2019-06-30 MED ORDER — FENTANYL CITRATE (PF) 100 MCG/2ML IJ SOLN
INTRAMUSCULAR | Status: AC
  Filled 2019-06-30: qty 2

## 2019-06-30 MED ORDER — DEXAMETHASONE SODIUM PHOSPHATE 10 MG/ML IJ SOLN
INTRAMUSCULAR | Status: DC | PRN
  Administered 2019-06-30: 5 mg via INTRAVENOUS

## 2019-06-30 MED ORDER — ONDANSETRON HCL 4 MG/2ML IJ SOLN
INTRAMUSCULAR | Status: DC | PRN
  Administered 2019-06-30: 4 mg via INTRAVENOUS

## 2019-06-30 MED ORDER — BUTAMBEN-TETRACAINE-BENZOCAINE 2-2-14 % EX AERO
1.0000 | INHALATION_SPRAY | Freq: Once | CUTANEOUS | Status: DC
  Filled 2019-06-30: qty 20

## 2019-06-30 MED ORDER — FENTANYL CITRATE (PF) 100 MCG/2ML IJ SOLN: 25.0000 ug | INTRAMUSCULAR | Status: DC | PRN

## 2019-06-30 MED ORDER — PROPOFOL 500 MG/50ML IV EMUL
INTRAVENOUS | Status: DC | PRN
  Administered 2019-06-30: 60 ug/kg/min via INTRAVENOUS

## 2019-06-30 MED ORDER — LIDOCAINE HCL (CARDIAC) PF 100 MG/5ML IV SOSY
PREFILLED_SYRINGE | INTRAVENOUS | Status: DC | PRN
  Administered 2019-06-30: 50 mg via INTRAVENOUS

## 2019-06-30 MED ORDER — LACTATED RINGERS IV SOLN: Freq: Once | INTRAVENOUS | Status: AC

## 2019-06-30 MED ORDER — CEFDINIR 300 MG PO CAPS: 300.0000 mg | ORAL_CAPSULE | Freq: Two times a day (BID) | ORAL | 0 refills | Status: DC

## 2019-06-30 MED ORDER — PROPOFOL 500 MG/50ML IV EMUL
INTRAVENOUS | Status: AC
  Filled 2019-06-30: qty 50

## 2019-06-30 MED ORDER — PROPOFOL 10 MG/ML IV BOLUS
INTRAVENOUS | Status: DC | PRN
  Administered 2019-06-30: 80 mg via INTRAVENOUS

## 2019-06-30 NOTE — Anesthesia Postprocedure Evaluation (Signed)
Anesthesia Post Note  Patient: Corey Carr  Procedure(s) Performed: VIDEO BRONCHOSCOPY WITH ENDOBRONCHIAL NAVIGATION (N/A ) VIDEO BRONCHOSCOPY WITH ENDOBRONCHIAL ULTRASOUND (N/A )  Patient location during evaluation: PACU Anesthesia Type: General Level of consciousness: awake and alert Pain management: pain level controlled Vital Signs Assessment: post-procedure vital signs reviewed and stable Respiratory status: spontaneous breathing, nonlabored ventilation and respiratory function stable Cardiovascular status: blood pressure returned to baseline and stable Postop Assessment: no signs of nausea or vomiting Anesthetic complications: no     Last Vitals:  Vitals:   06/30/19 1037 06/30/19 1122  BP: 112/80 136/76  Pulse: 80 82  Resp: 19 16  Temp: 37.2 C 36.4 C  SpO2: 100% 92%    Last Pain:  Vitals:   06/30/19 1122  TempSrc: Oral  PainSc:                  [name]

## 2019-06-30 NOTE — Plan of Care (Signed)

## 2019-06-30 NOTE — Plan of Care (Signed)
  Problem: Education: Goal: Knowledge of General Education information will improve Description: Including pain rating scale, medication(s)/side effects and non-pharmacologic comfort measures Outcome: Progressing   Problem: Nutrition: Goal: Adequate nutrition will be maintained Outcome: Progressing   

## 2019-06-30 NOTE — Progress Notes (Signed)
Patient ID: Corey Carr, male   DOB: 07-04-1940, 79 y.o.   MRN: [id number]  Checking glucose with lab draw currently.  Patient asymptomatic with low sugar on fingerstick.  Because his fingers are so cold that may not be correlating.  Awaiting lab glucose to figure out if this is a real number.  Dr. Leslye Peer

## 2019-06-30 NOTE — Anesthesia Procedure Notes (Signed)
Procedure Name: Intubation Date/Time: 06/30/2019 8:18 AM Performed by: Aline Brochure, CRNA Pre-anesthesia Checklist: Patient identified, Emergency Drugs available, Suction available and Patient being monitored Patient Re-evaluated:Patient Re-evaluated prior to induction Oxygen Delivery Method: Circle system utilized Preoxygenation: Pre-oxygenation with 100% oxygen Induction Type: IV induction Ventilation: Mask ventilation without difficulty Laryngoscope Size: McGraph and 4 Grade View: Grade I Tube type: Oral Tube size: 8.5 mm Number of attempts: 1 Airway Equipment and Method: Stylet and Video-laryngoscopy Placement Confirmation: ETT inserted through vocal cords under direct vision,  positive ETCO2 and breath sounds checked- equal and bilateral Secured at: 21 cm Tube secured with: Tape Dental Injury: Teeth and Oropharynx as per pre-operative assessment

## 2019-06-30 NOTE — Anesthesia Preprocedure Evaluation (Signed)
Anesthesia Evaluation  Patient identified by MRN, date of birth, ID band Patient awake    Reviewed: Allergy & Precautions, H&P , NPO status , Patient's Chart, lab work & pertinent test results, reviewed documented beta blocker date and time   Airway Mallampati: II  TM Distance: >3 FB Neck ROM: full    Dental  (+) Teeth Intact   Pulmonary asthma , pneumonia, resolved, COPD, former smoker,    Pulmonary exam normal        Cardiovascular Exercise Tolerance: Poor hypertension, On Medications negative cardio ROS Normal cardiovascular exam Rhythm:regular Rate:Normal     Neuro/Psych negative neurological ROS  negative psych ROS   GI/Hepatic negative GI ROS, Neg liver ROS,   Endo/Other  negative endocrine ROS  Renal/GU Renal disease  negative genitourinary   Musculoskeletal   Abdominal   Peds  Hematology negative hematology ROS (+)   Anesthesia Other Findings Past Medical History: No date: Hypertension Past Surgical History: 1957: KNEE SURGERY; Right BMI    Body Mass Index: 14.60 kg/m     Reproductive/Obstetrics negative OB ROS                             Anesthesia Physical Anesthesia Plan  ASA: III  Anesthesia Plan: General ETT   Post-op Pain Management:    Induction:   PONV Risk Score and Plan: 3  Airway Management Planned:   Additional Equipment:   Intra-op Plan:   Post-operative Plan:   Informed Consent: I have reviewed the patients History and Physical, chart, labs and discussed the procedure including the risks, benefits and alternatives for the proposed anesthesia with the patient or authorized representative who has indicated his/her understanding and acceptance.     Dental Advisory Given  Plan Discussed with: CRNA  Anesthesia Plan Comments:         Anesthesia Quick Evaluation

## 2019-06-30 NOTE — Procedures (Signed)
FIBEROPTIC BRONCHOSCOPY WITH BAL PROCEDURE NOTE  ELECTROMAGNETIC NAVIGATIONAL BRONCHOSCOPY PROCEDURE NOTE   ENDOBRONCHIAL ULTRASOUND PROCEDURE NOTE    Flexible bronchoscopy was performed on 06/30/19  by : Lanney Gins MD  assistance by : Elenora Gamma RT  and 2)[facility] staff 3) anesthesia team   Indication for the procedure was :  Pre-procedural H&P. The following assessment was performed on the day of the procedure prior to initiating sedation History:  Chest pain no Dyspnea y Hemoptysis n Cough y Fever n Other pertinent items n  Examination Vital signs -reviewed as per nursing documentation today Cardiac    Murmurs: nn  Rubs : n  Gallop: n Lungs Wheezing: n Rales : y Rhonchi {:y  Other pertinent findings: y   Pre-procedural assessment for Procedural Sedation included: Depth of sedation: As per anesthesia team  ASA Classification:  3 Mallampati airway assessment: 2    Medication list reviewed: y  The patient's interval history was taken and revealed: no new complaints The pre- procedure physical examination revealed: No new findings Refer to prior clinic note for details.  Informed Consent: Informed consent was obtained from:  patient after explanation of procedure and risks, benefits, as well as alternative procedures available.  Explanation of level of sedation and possible transfusion was also provided.    Procedural Preparation: Time out was performed and patient was identified by name and birthdate and procedure to be performed and side for sampling, if any, was specified. Pt was intubated by anesthesia.  The patient was appropriately draped.  Fiberoptic bronchoscopy with BAL Procedure Findings: Bronchoscope was inserted via ETT  without difficulty.  Posterior oropharynx, epiglottis, arytenoids, false cords and vocal cords were not visualized as these were bypassed by endotracheal tube.   The distal trachea was normal  in circumference and  appearance without mucosal, cartilaginous or branching abnormalities.  The main carina was mildy splayed .   All right and left lobar airways were visualized to the Subsegmental level.  Sub- sub segmental carinae were identified in all the distal airways.   Secretions were visible in the following airways and appeared to be clear.  The mucosa was : mildly edematous ar LUL   Airways were notable for:        exophytic lesions :none       extrinsic compression in the following distributions: LUL.       Friable mucosa: LUL       Anthrocotic material /pigmentation: n   Pictorial documentation attached: n    Postprocedure diagnosis: malignancy   Electromagnetic navigational bronchoscopy procedure findings: After appropriate image planning planning and registration LG was advanced to target at the left upper lobe.  Initially Cytobrush specimen was obtained near target site, the specimen was noted to be very cellular with atypical cells found as per [facility] report.  Next bronchoalveolar lavage was done at target lesion sent for cytology.  Next target lesion had surgical biopsies done x5 with atypical cells reported per [facility]. Postprocedure diagnosis-malignancy --------------------------------------------------------------------     Endobronchial ultrasound assisted lymph node biopsies procedure findings: The fiberoptic bronchoscope was removed and the EBUS scope was introduced. Examination began to evaluate for pathologically enlarged lymph nodes starting on the right side progressing to the Left   All lymph node biopsies performed with 21g needle. Lymph node biopsies were sent in cytolite for all stations.  Starting with stage 10R ultrasound measurement was obtained at 0.7 cm in this lymph node was not biopsied, next station 4R was measured at approximately 0.6 cm  and was not biopsied, next station 7 was evaluated and found to be approximately 1.3 cm this was biopsied 4 times in CytoLyt  solution.  Next station for L was evaluated and was approximately 1.8 cm and was biopsied 4 times.  Next station 10 L was evaluated to be approximately 1.6 cm and was biopsied 3 times.  All lymph node stations had good lymphoid shedding as per [facility] report   Specimens obtained included:                     Cytology brushes : LUL  Broncho-alveolar lavage site:LUL  sent for cytology and microbiology                             73m volume infused 32 ml volume returned with serosang appearance    Fluoroscopy Used: 3 minutes;        Pictorial documentation attached: n                 Immediate sampling complications included:none  Epinephrine 043mwas used topically  The bronchoscopy was terminated due to completion of the planned procedure and the bronchoscope was removed.   Total dosage of Lidocaine was 42m642motal fluoroscopy time was 3 minutes    Estimated Blood loss: 1cc.  Complications included:  none immediate   Disposition: still inpatient  Follow up with Dr. AleGita Kudon 5 days for result discussion.   FreClaudette Stapler  [facility]

## 2019-06-30 NOTE — Care Management Important Message (Signed)
Important Message  Patient Details  Name: Corey Carr MRN: [id number] Date of Birth: [birth year]   Medicare Important Message Given:  Yes     [name] 06/30/2019, 11:27 AM

## 2019-06-30 NOTE — Progress Notes (Signed)
Pulmonary Medicine          Date: 06/30/2019,   MRN# [id number] Corey Carr 08-05-1940     AdmissionWeight: 43.1 kg                 CurrentWeight: 43.5 kg      CHIEF COMPLAINT:   Left lung mass with hilar mediastinal lymphadenopathy   SUBJECTIVE   Patient evaluated this am.  Last meal 9pm (apple sauce with pills).  Patient has not had anticoagulation or antiplatelet agents.     No   PAST MEDICAL HISTORY   Past Medical History:  Diagnosis Date  . Hypertension      SURGICAL HISTORY   Past Surgical History:  Procedure Laterality Date  . KNEE SURGERY Right 1957     FAMILY HISTORY   Family History  Problem Relation Age of Onset  . Emphysema Father   . Lung cancer Maternal Uncle      SOCIAL HISTORY   Social History   Tobacco Use  . Smoking status: Former Smoker    Packs/day: 0.50    Years: 60.00    Pack years: 30.00    Types: Cigarettes    Quit date: 2015    Years since quitting: 6.1  . Smokeless tobacco: Never Used  Substance Use Topics  . Alcohol use: No  . Drug use: No     MEDICATIONS    Home Medication:  Current Outpatient Rx  . Order #: [id number] Class: Normal  . Order #: [id number] Class: Normal    Current Medication:  Current Facility-Administered Medications:  .  [MAR Hold] 0.9 %  sodium chloride infusion, , Intravenous, PRN, Domenic Polite, MD, Stopped at 06/28/19 [phone] .  [MAR Hold] acetaminophen (TYLENOL) tablet 650 mg, 650 mg, Oral, Q6H PRN **OR** [MAR Hold] acetaminophen (TYLENOL) suppository 650 mg, 650 mg, Rectal, Q6H PRN, Doutova, Anastassia, MD .  Doug Sou Hold] albuterol (VENTOLIN HFA) 108 (90 Base) MCG/ACT inhaler 2 puff, 2 puff, Inhalation, Q3H PRN, Roel Cluck, Anastassia, MD .  Doug Sou Hold] ALPRAZolam Duanne Moron) tablet 0.5 mg, 0.5 mg, Oral, QPM, Virginie Josten, MD, 0.5 mg at 06/28/19 1705 .  butamben-tetracaine-benzocaine (CETACAINE) spray 1 spray, 1 spray, Topical, Once, Vane Yapp, MD .   butamben-tetracaine-benzocaine (CETACAINE) spray 1 spray, 1 spray, Topical, Once, Ottie Glazier, MD .  Doug Sou Hold] cefdinir (OMNICEF) capsule 300 mg, 300 mg, Oral, Q12H, Domenic Polite, MD, 300 mg at 06/29/19 2200 .  [MAR Hold] Chlorhexidine Gluconate Cloth 2 % PADS 6 each, 6 each, Topical, Daily, Domenic Polite, MD, 6 each at 06/29/19 1408 .  [MAR Hold] feeding supplement (ENSURE ENLIVE) (ENSURE ENLIVE) liquid 237 mL, 237 mL, Oral, TID BM, Domenic Polite, MD, 237 mL at 06/29/19 2200 .  [MAR Hold] fluticasone furoate-vilanterol (BREO ELLIPTA) 100-25 MCG/INH 1 puff, 1 puff, Inhalation, q morning - 10a, 1 puff at 06/29/19 0856 **AND** [MAR Hold] umeclidinium bromide (INCRUSE ELLIPTA) 62.5 MCG/INH 1 puff, 1 puff, Inhalation, q morning - 10a, Hall, Scott A, RPH, 1 puff at 06/29/19 0855 .  [MAR Hold] HYDROcodone-acetaminophen (NORCO/VICODIN) 5-325 MG per tablet 1-2 tablet, 1-2 tablet, Oral, Q4H PRN, Doutova, Anastassia, MD .  lidocaine (XYLOCAINE) 2 % jelly 1 application, 1 application, Topical, Once, Catrice Zuleta, MD .  lidocaine (XYLOCAINE) 2 % jelly 1 application, 1 application, Topical, Once, Ottie Glazier, MD .  Doug Sou Hold] multivitamin with minerals tablet 1 tablet, 1 tablet, Oral, Daily, Domenic Polite, MD, 1 tablet at 06/29/19 0854 .  [MAR Hold] ondansetron (ZOFRAN) tablet 4 mg,  4 mg, Oral, Q6H PRN **OR** [MAR Hold] ondansetron (ZOFRAN) injection 4 mg, 4 mg, Intravenous, Q6H PRN, Doutova, Anastassia, MD .  Doug Sou Hold] phosphorus (K PHOS NEUTRAL) tablet 500 mg, 500 mg, Oral, Once, Domenic Polite, MD .  Doug Sou Hold] sodium chloride flush (NS) 0.9 % injection 10 mL, 10 mL, Intravenous, Q12H, Domenic Polite, MD, 10 mL at 06/29/19 2200 .  [MAR Hold] traZODone (DESYREL) tablet 50 mg, 50 mg, Oral, QHS PRN, Lang Snow, NP, 50 mg at 06/29/19 2200    ALLERGIES   Patient has no known allergies.     REVIEW OF SYSTEMS    Review of Systems:  Gen:  Denies  fever, sweats, chills  weigh loss  HEENT: Denies blurred vision, double vision, ear pain, eye pain, hearing loss, nose bleeds, sore throat Cardiac:  No dizziness, chest pain or heaviness, chest tightness,edema Resp:   Denies cough or sputum porduction, shortness of breath,wheezing, hemoptysis,  Gi: Denies swallowing difficulty, stomach pain, nausea or vomiting, diarrhea, constipation, bowel incontinence Gu:  Denies bladder incontinence, burning urine Ext:   Denies Joint pain, stiffness or swelling Skin: Denies  skin rash, easy bruising or bleeding or hives Endoc:  Denies polyuria, polydipsia , polyphagia or weight change Psych:   Denies depression, insomnia or hallucinations   Other:  All other systems negative   VS: BP 120/71   Pulse 75   Temp (!) 100.5 F (38.1 C) (Tympanic)   Resp 16   Ht 5\' 8"  (1.727 m)   Wt 43.5 kg   SpO2 100%   BMI 14.60 kg/m      PHYSICAL EXAM    GENERAL:NAD, no fevers, chills, no weakness no fatigue HEAD: Normocephalic, atraumatic.  EYES: Pupils equal, round, reactive to light. Extraocular muscles intact. No scleral icterus.  MOUTH: Moist mucosal membrane. Dentition intact. No abscess noted.  EAR, NOSE, THROAT: Clear without exudates. No external lesions.  NECK: Supple. No thyromegaly. No nodules. No JVD.  PULMONARY: decreased air entry bilaterally CARDIOVASCULAR: S1 and S2. Regular rate and rhythm. No murmurs, rubs, or gallops. No edema. Pedal pulses 2+ bilaterally.  GASTROINTESTINAL: Soft, nontender, nondistended. No masses. Positive bowel sounds. No hepatosplenomegaly.  MUSCULOSKELETAL: No swelling, clubbing, or edema. Range of motion full in all extremities.  NEUROLOGIC: Cranial nerves II through XII are intact. No gross focal neurological deficits. Sensation intact. Reflexes intact.  SKIN: No ulceration, lesions, rashes, or cyanosis. Skin warm and dry. Turgor intact.  PSYCHIATRIC: Mood, affect within normal limits. The patient is awake, alert and oriented x 3.  Insight, judgment intact.       IMAGING    DG Chest 2 View  Result Date: 06/17/2019 CLINICAL DATA:  Dizziness and shortness of breath. EXAM: CHEST - 2 VIEW COMPARISON:  05/23/2019. FINDINGS: Similar appearance of volume loss in the left hemithorax with elevation of the left hemidiaphragm and left base collapse/consolidative opacity. There is diffuse interstitial and pleural opacity in the left chest. Right lung is hyperexpanded without focal consolidation or pleural effusion. Bones are diffusely demineralized. IMPRESSION: Largely stable exam. Marked volume loss left hemithorax with posterior collapse/consolidative change in diffuse pleural thickening. Electronically Signed   By: Misty Stanley M.D.   On: 06/17/2019 16:43   CT Angio Chest PE W/Cm &/Or Wo Cm  Result Date: 06/17/2019 CLINICAL DATA:  Shortness of breath EXAM: CT ANGIOGRAPHY CHEST WITH CONTRAST TECHNIQUE: Multidetector CT imaging of the chest was performed using the standard protocol during bolus administration of intravenous contrast. Multiplanar CT image reconstructions and MIPs  were obtained to evaluate the vascular anatomy. CONTRAST:  44mL OMNIPAQUE IOHEXOL 350 MG/ML SOLN COMPARISON:  03/06/2019 FINDINGS: Cardiovascular: No filling defects in the pulmonary arteries to suggest pulmonary emboli. Heart is normal size. Aorta normal caliber. Aortic and coronary artery atherosclerosis. Mediastinum/Nodes: There is pneumomediastinum. Gas seen around the main pulmonary artery and in the posterior mediastinum. No adenopathy. Lungs/Pleura: Volume loss on the left with elevation of the left hemidiaphragm. Findings are chronic. There is a chronic masslike area of consolidation in the left lower lobe which is similar to prior study. Advanced centrilobular emphysema and areas of scarring in both lungs. Possible small left effusion, stable. Upper Abdomen: Imaging into the upper abdomen shows no acute findings. Musculoskeletal: Cachexia.  No acute bony  abnormality. Review of the MIP images confirms the above findings. IMPRESSION: Pneumomediastinum seen in the middle and posterior mediastinum. No associated pneumothorax. Advanced centrilobular emphysema. Stable chronic volume loss on the left with elevation of the left hemidiaphragm and masslike consolidation in the left lower lobe. As recommended on prior study, bronchoscopy with attention to the left lower lobe and/or PET CT may be helpful to exclude left lower lobe mass lesion. Small right pleural effusion. Areas of scarring in the lungs bilaterally. Aortic Atherosclerosis (ICD10-I70.0) and Emphysema (ICD10-J43.9). Electronically Signed   By: Rolm Baptise M.D.   On: 06/17/2019 21:12   CT Abdomen Pelvis W Contrast  Result Date: 06/17/2019 CLINICAL DATA:  Hypercalcemia. EXAM: CT ABDOMEN AND PELVIS WITH CONTRAST TECHNIQUE: Multidetector CT imaging of the abdomen and pelvis was performed using the standard protocol following bolus administration of intravenous contrast. CONTRAST:  59mL OMNIPAQUE IOHEXOL 350 MG/ML SOLN COMPARISON:  None. FINDINGS: Lower chest: Masslike opacity seen in the left lower lobe as described on prior chest CT. Trace left pleural effusion. Volume loss on the left with elevation of the left hemidiaphragm. Advanced emphysema. Scarring in the right lower lobe. Hepatobiliary: No focal hepatic abnormality. Gallbladder unremarkable. Pancreas: Calcifications throughout the pancreas compatible with chronic pancreatitis. No evidence of acute pancreatitis, fall suspicious pancreatic lesion or pancreatic ductal dilatation. Spleen: No focal abnormality.  Normal size. Adrenals/Urinary Tract: Small cysts in the left kidney. No hydronephrosis. Adrenal glands and urinary bladder unremarkable. Stomach/Bowel: Large stool burden throughout the colon. Distention of the rectum with stool concerning for fecal impaction. Stomach and small bowel decompressed, unremarkable. Vascular/Lymphatic: Aortic  atherosclerosis. No enlarged abdominal or pelvic lymph nodes. Reproductive: Prostate prominence. Other: No free fluid or free air. Musculoskeletal: Cachexia. No acute bony abnormality. Degenerative disc and facet disease in the lower lumbar spine. IMPRESSION: Masslike opacity in the left lower lobe as described on chest CT concerning for possible pulmonary mass lesions/malignancy. This could be further evaluated with bronchoscopy and/or PET CT as described on prior chest CT. Advanced emphysema. Large stool burden throughout the colon. Appearance is concerning for fecal impaction. Chronic pancreatitis changes.  No evidence of acute pancreatitis. Aortic atherosclerosis. Electronically Signed   By: Rolm Baptise M.D.   On: 06/17/2019 21:16   DG Chest Port 1 View  Result Date: 06/25/2019 CLINICAL DATA:  Weakness, COVID-19 positive EXAM: PORTABLE CHEST 1 VIEW COMPARISON:  06/17/2019 FINDINGS: Chronic volume loss with consolidation in the left hemithorax, stable from prior. There may be mildly increased opacity within the aerated portion of the left lung. Heart size is stable. Mediastinal structures remain shifted to the left. There is hyperexpansion of the right lung field with chronic emphysematous changes and right apical scarring. No pneumothorax. IMPRESSION: Chronic volume loss with consolidation in the  left hemithorax, stable from prior. There may be mildly increased opacity within the aerated portion of the left lung, which could reflect pneumonia given the patient's history. Electronically Signed   By: Davina Poke D.O.   On: 06/25/2019 13:31   ECHOCARDIOGRAM COMPLETE  Result Date: 06/26/2019   ECHOCARDIOGRAM REPORT   Patient Name:   Corey Carr Date of Exam: 06/26/2019 Medical Rec #:  [id number]       Height:       68.0 in Accession #:    [id number]      Weight:       92.2 lb Date of Birth:  [birth year]       BSA:          1.47 m Patient Age:    79 years        BP:           89/66 mmHg Patient Gender: M                HR:           129 bpm. Exam Location:  [facility] Procedure: 2D Echo Indications:     ATRIAL FIBRILLATION 427.31/I48.91  History:         Patient has no prior history of Echocardiogram examinations.                  COPD; Risk Factors:Hypertension.  Sonographer:     Avanell Shackleton Referring Phys:  [place] Diagnosing Phys: Ida Rogue MD  Sonographer Comments: Image acquisition challenging due to patient body habitus. IMPRESSIONS  1. Left ventricular ejection fraction, by visual estimation, is 50 to 55%. The left ventricle has normal function. There is moderately increased left ventricular hypertrophy.  2. Left ventricular diastolic parameters are indeterminate.  3. The left ventricle has no regional wall motion abnormalities.  4. Global right ventricle has normal systolic function.The right ventricular size is normal. No increase in right ventricular wall thickness.  5. Left atrial size was normal.  6. Tricuspid valve regurgitation is mild-moderate.  7. Mildly elevated pulmonary artery systolic pressure.  8. Tachycardia noted/atrial flutter, rate 130 bpm FINDINGS  Left Ventricle: Left ventricular ejection fraction, by visual estimation, is 50 to 55%. The left ventricle has normal function. The left ventricle has no regional wall motion abnormalities. There is moderately increased left ventricular hypertrophy. Left ventricular diastolic parameters are indeterminate. Normal left atrial pressure. Right Ventricle: The right ventricular size is normal. No increase in right ventricular wall thickness. Global RV systolic function is has normal systolic function. The tricuspid regurgitant velocity is 2.90 m/s, and with an assumed right atrial pressure  of 5 mmHg, the estimated right ventricular systolic pressure is mildly elevated at 38.6 mmHg. Left Atrium: Left atrial size was normal in size. Right Atrium: Right atrial size was normal in size Pericardium: There is no evidence of pericardial effusion.  Mitral Valve: The mitral valve is normal in structure. Mild to moderate mitral valve regurgitation. No evidence of mitral valve stenosis by observation. Tricuspid Valve: The tricuspid valve is normal in structure. Tricuspid valve regurgitation is mild-moderate. Aortic Valve: The aortic valve is normal in structure. Aortic valve regurgitation is not visualized. Mild to moderate aortic valve sclerosis/calcification is present, without any evidence of aortic stenosis. Pulmonic Valve: The pulmonic valve was normal in structure. Pulmonic valve regurgitation is not visualized. Pulmonic regurgitation is not visualized. Aorta: The aortic root, ascending aorta and aortic arch are all structurally normal, with no evidence of dilitation or  obstruction. Venous: The inferior vena cava is normal in size with greater than 50% respiratory variability, suggesting right atrial pressure of 3 mmHg. IAS/Shunts: No atrial level shunt detected by color flow Doppler. There is no evidence of a patent foramen ovale. No ventricular septal defect is seen or detected. There is no evidence of an atrial septal defect.  LEFT VENTRICLE PLAX 2D LVIDd:         2.60 cm LVIDs:         2.26 cm LV PW:         0.68 cm LV IVS:        0.92 cm LVOT diam:     1.70 cm LV SV:         7 ml LV SV Index:   5.30 LVOT Area:     2.27 cm  LEFT ATRIUM             Index       RIGHT ATRIUM           Index LA diam:        2.70 cm 1.84 cm/m  RA Area:     16.00 cm LA Vol (A2C):   58.4 ml 39.72 ml/m RA Volume:   45.70 ml  31.08 ml/m LA Vol (A4C):   33.6 ml 22.85 ml/m LA Biplane Vol: 48.1 ml 32.71 ml/m   AORTA Ao Root diam: 3.30 cm TRICUSPID VALVE TR Peak grad:   33.6 mmHg TR Vmax:        305.00 cm/s  SHUNTS Systemic Diam: 1.70 cm  Ida Rogue MD Electronically signed by Ida Rogue MD Signature Date/Time: 06/26/2019/7:28:13 PM    Final    [place] EKG SITE RITE  Result Date: 06/26/2019 If Site Rite image not attached, placement could not be confirmed due to current  cardiac rhythm.     ASSESSMENT/PLAN   Left lower lobe lung mass with hilar/mediastinal lymphadenopathy  -Discussed with oncology-patient would benefit from tissue diagnosis  -Patient wishes to pursue fully aggressive measures at this time -We had discussed plans for biopsy and patient wants to proceed -Discussed benefits as well as risks of procedure.  Benefits include accurate tissue diagnosis and staging as well as ability to formulate plan for treatment.  Risks of procedure include but are not limited to pneumothorax/pneumomediastinum which may require chest tube and mechanical ventilation of prolonged ICU stay, bleeding, infection and very rarely death.  Patient understands risks and wishes to proceed with planned bronchoscopy/ENB/EBUS -Discussed procedure with Dr Ivy Lynn and Annia Belt cardiopulmonary manager.    Advanced COPD  -Typical COPD care path  -DuoNebs every 6 hours while awake  -Incentive spirometer every hour while awake  -Flutter valve to help expectorate thickened phlegm  -No need for IV steroids at this time       Thank you for allowing me to participate in the care of this patient.   Patient/Family are satisfied with care plan and all questions have been answered.   This document was prepared using Dragon voice recognition software and may include unintentional dictation errors.     Ottie Glazier, M.D.  Division of [facility]

## 2019-06-30 NOTE — Progress Notes (Addendum)
pts blood sugar low, md notified, because of pts cold fingers will await labs results  1333- pts glucose is 133,  md notified, continue to monitor

## 2019-06-30 NOTE — Progress Notes (Signed)
Pt back from procedure, resting comfortably, expressed no needs at this time

## 2019-06-30 NOTE — Progress Notes (Signed)
Progress Note  Patient Name: Corey Carr Date of Encounter: 06/30/2019  Primary Cardiologist: Dr. Rockey Situ  Subjective   Earlier reported CP with bronchoscopy, eased off now. Breathing status stable on 2L [place] oxygen. Low blood sugar this AM.   Inpatient Medications    Scheduled Meds: . ALPRAZolam  0.5 mg Oral QPM  . cefdinir  300 mg Oral Q12H  . Chlorhexidine Gluconate Cloth  6 each Topical Daily  . feeding supplement (ENSURE ENLIVE)  237 mL Oral TID BM  . fluticasone furoate-vilanterol  1 puff Inhalation q morning - 10a   And  . umeclidinium bromide  1 puff Inhalation q morning - 10a  . multivitamin with minerals  1 tablet Oral Daily  . sodium chloride flush  10 mL Intravenous Q12H   Continuous Infusions: . sodium chloride 0 mL/hr at 06/28/19 0218   PRN Meds: sodium chloride, acetaminophen **OR** acetaminophen, albuterol, HYDROcodone-acetaminophen, ondansetron **OR** ondansetron (ZOFRAN) IV, traZODone   Vital Signs    Vitals:   06/30/19 1008 06/30/19 1025 06/30/19 1037 06/30/19 1122  BP:  132/75 112/80 136/76  Pulse:  80 80 82  Resp:  (!) 22 19 16   Temp: 99 F (37.2 C)  99 F (37.2 C) 97.6 F (36.4 C)  TempSrc:    Oral  SpO2:  98% 100% 92%  Weight:      Height:        Intake/Output Summary (Last 24 hours) at 06/30/2019 1151 Last data filed at 06/30/2019 1037 Gross per 24 hour  Intake 75 ml  Output 500 ml  Net -425 ml   Last 3 Weights 06/30/2019 06/28/2019 06/26/2019  Weight (lbs) 96 lb 96 lb 14.4 oz 92 lb 2.4 oz  Weight (kg) 43.545 kg 43.954 kg 41.8 kg      Telemetry    NSR, PACs - Personally Reviewed  ECG    No new tracings - Personally Reviewed  Physical Exam   GEN: Frail and elderly male, cachetic. No acute distress.   Neck: No JVD Cardiac: RRR, no murmurs, rubs, or gallops.  Respiratory: Reduced respiratory effort. Bilateral upper lung bandages. Bibasilar reduced breath sounds GI: nontender, non-distended  MS: No edema; No deformity. Neuro:   Nonfocal  Psych: Normal affect   Labs    High Sensitivity Troponin:   Recent Labs  Lab 06/17/19 1511  TROPONINIHS 29*      Cardiac EnzymesNo results for input(s): TROPONINI in the last 168 hours. No results for input(s): TROPIPOC in the last 168 hours.   Chemistry Recent Labs  Lab 06/26/19 0404 06/26/19 0404 06/27/19 0356 06/28/19 0442 06/29/19 0432  NA 136   < > 139 137 137  K 3.9   < > 3.4* 4.1 3.9  CL 106   < > 110 108 106  CO2 21*   < > 21* 22 21*  GLUCOSE 104*   < > 105* 93 99  BUN 25*   < > 14 16 13   CREATININE 0.84   < > 0.68 0.68 0.74  CALCIUM 8.0*   < > 7.2* 7.3* 7.3*  PROT 5.9*  --   --  5.6* 5.4*  ALBUMIN 2.6*  --   --  2.2* 2.2*  AST 22  --   --  32 26  ALT 20  --   --  53* 45*  ALKPHOS 68  --   --  71 64  BILITOT 1.0  --   --  0.9 0.6  GFRNONAA >60   < > >  60 >60 >60  GFRAA >60   < > >60 >60 >60  ANIONGAP 9   < > 8 7 10    < > = values in this interval not displayed.     Hematology Recent Labs  Lab 06/27/19 0356 06/28/19 0442 06/29/19 0432  WBC 13.8* 14.7* 15.4*  RBC 2.79* 3.04* 3.19*  HGB 7.6* 8.3* 8.6*  HCT 22.6* 24.5* 26.5*  MCV 81.0 80.6 83.1  MCH 27.2 27.3 27.0  MCHC 33.6 33.9 32.5  RDW 18.0* 18.2* 18.1*  PLT 521* 573* 618*    BNPNo results for input(s): BNP, PROBNP in the last 168 hours.   DDimer No results for input(s): DDIMER in the last 168 hours.   Radiology    DG C-Arm 1-60 Min-No Report  Result Date: 06/30/2019 Fluoroscopy was utilized by the requesting physician.  No radiographic interpretation.    Cardiac Studies   Echocardiogram  08/24/2019 1. Left ventricular ejection fraction, by visual estimation, is 50 to  55%. The left ventricle has normal function. There is moderately increased  left ventricular hypertrophy.  2. Left ventricular diastolic parameters are indeterminate.  3. The left ventricle has no regional wall motion abnormalities.  4. Global right ventricle has normal systolic function.The right    ventricular size is normal. No increase in right ventricular wall  thickness.  5. Left atrial size was normal.  6. Tricuspid valve regurgitation is mild-moderate.  7. Mildly elevated pulmonary artery systolic pressure.  8. Tachycardia noted/atrial flutter, rate 130 bpm   Patient Profile     79 y.o. male with a history of lung mass, suspected lung cancer, COPD, hypertension, Covid pneumonia 04/2019, recent hospitalization 05/2019 for hypercalcemia, dehydration, renal failure, anorexia, hypoglycemia, and recent atrial flutter with RVR.  Assessment & Plan    Atrial flutter with RVR --NSR with ectopy and current rate control. Earlier atrial flutter this admission, appears new in onset. NSR on telemetry with start of amiodarone, which fell off of the patient's list on 2/6.  Restarted amiodarone 200mg  BID.  Most recent ALT trivially elevated - continue to monitor on amiodarone. Will need outpatient monitoring on amiodarone per guidelines. Given anemia and as not an ideal candidate for [facility] at this time with possible upcoming procedures including bronchoscopy, will continue to defer anticoagulation. CHA2DS2VASc score of at least 3 (HTN, agex2). No BMET/CBC ordered today and added on to orders.  Lung CA/mass PNA, COPD --Continue abx. Per oncology, primary team  Anemia --Per IM. Daily CBC.   For questions or updates, please contact [place] Please consult [email or web address] for contact info under        Signed, Arvil Chaco, PA-C  06/30/2019, 11:51 AM

## 2019-06-30 NOTE — Transfer of Care (Signed)
Immediate Anesthesia Transfer of Care Note  Patient: Corey Carr  Procedure(s) Performed: VIDEO BRONCHOSCOPY WITH ENDOBRONCHIAL NAVIGATION (N/A ) VIDEO BRONCHOSCOPY WITH ENDOBRONCHIAL ULTRASOUND (N/A )  Patient Location: PACU  Anesthesia Type:General  Level of Consciousness: awake, alert  and oriented  Airway & Oxygen Therapy: Patient connected to face mask oxygen  Post-op Assessment: Post -op Vital signs reviewed and stable  Post vital signs: stable  Last Vitals:  Vitals Value Taken Time  BP 111/70 06/30/19 1007  Temp    Pulse 87 06/30/19 1017  Resp 17 06/30/19 1017  SpO2 73 % 06/30/19 1017  Vitals shown include unvalidated device data.  Last Pain:  Vitals:   06/30/19 0653  TempSrc: Tympanic  PainSc: 0-No pain         Complications: No apparent anesthesia complications

## 2019-06-30 NOTE — Discharge Summary (Signed)
[facility] [place] NAME: Corey Carr    MR#:  [id number]  DATE OF BIRTH:  September 07, 1940  DATE OF ADMISSION:  06/25/2019 ADMITTING PHYSICIAN: Toy Baker, MD  DATE OF DISCHARGE: 06/30/2019  PRIMARY CARE PHYSICIAN: [name], MD    ADMISSION DIAGNOSIS:  Hypoglycemia [E16.2] SIRS (systemic inflammatory response syndrome) ([place]) [R65.10] Community acquired pneumonia of left upper lobe of lung [J18.9]  DISCHARGE DIAGNOSIS:  Active Problems:   Failure to thrive (0-79)   Mass of lower lobe of left lung   Essential hypertension   Hypercalcemia   COPD with chronic bronchitis (HCC)   Cancer cachexia (HCC)   Protein-calorie malnutrition, severe   Hypoglycemia   SIRS (systemic inflammatory response syndrome) (HCC)   Postobstructive pneumonia   Leukocytosis   Pressure injury of skin   Atrial flutter (HCC)   AF (paroxysmal atrial fibrillation) ([place])   SECONDARY DIAGNOSIS:   Past Medical History:  Diagnosis Date  . Atrial flutter ([place])    CHADsVASc 3  . Hypertension     HOSPITAL COURSE:   1.  Post obstructive pneumonia.  Patient received IV cefepime and Zithromax and was switched over to oral cefdinir.  I will continue 4 more doses upon discharge home. 2.  Lung mass left lower lobe with weight loss.  Patient had bronchoscopy with biopsies.  This is concerning for lung cancer.  Patient also had pneumomediastinum seen on recent CT scan of the chest.  This pneumomediastinum sometimes can cause the strange pains in the chest that he is having.  Follow-up as outpatient.  Set up appointment with Dr. Grayland Ormond oncology as outpatient. 3.  Pseudo-hypoglycemia.  The patient's fingersticks do not correlate with his lab blood draw sugar.  I think this is pseudohypoglycemia.  Do not check patient's fingersticks.  Always get a lab draw. 4.  Atrial fibrillation with rapid ventricular response.  Patient converted over to normal sinus rhythm so  this is paroxysmal atrial fibrillation.  Continue amiodarone 200 mg twice a day.  Can go back on aspirin as outpatient. 5.  Hypercalcemia from malignancy.  Treated with Zometa. 6.  Severe protein calorie malnutrition.  Continue Ensure. 7.  Acute on chronic hypoxic respiratory failure.  Now requires oxygen.  Oxygen to be set up. 8.  COPD.  Patient will be set up with home oxygen.  Continue Trelegy inhaler.  DISCHARGE CONDITIONS:   Fair  CONSULTS OBTAINED:  Treatment Team:  Minna Merritts, MD Lloyd Huger, MD Ottie Glazier, MD  DRUG ALLERGIES:  No Known Allergies  DISCHARGE MEDICATIONS:   Allergies as of 06/30/2019   No Known Allergies     Medication List    STOP taking these medications   meloxicam 15 MG tablet Commonly known as: MOBIC     TAKE these medications   albuterol 108 (90 Base) MCG/ACT inhaler Commonly known as: VENTOLIN HFA Inhale 2 puffs into the lungs See admin instructions. Inhale 2 puffs into the lungs every 3 hours as needed for wheezing.   amiodarone 200 MG tablet Commonly known as: PACERONE Take 1 tablet (200 mg total) by mouth 2 (two) times daily.   aspirin 325 MG tablet Take 1 tablet by mouth daily.   cefdinir 300 MG capsule Commonly known as: OMNICEF Take 1 capsule (300 mg total) by mouth 2 (two) times daily.   [employer] Glycerin Adult 2 g suppository Generic drug: glycerin adult SMARTSIG:1 SUPPOS Rectally As Needed   dexamethasone 4 MG tablet Commonly known as:  DECADRON Take 1 tablet (4 mg total) by mouth daily.   esomeprazole 20 MG capsule Commonly known as: NEXIUM Take 20 mg by mouth daily at 12 noon.   feeding supplement (ENSURE ENLIVE) Liqd Take 237 mLs by mouth 3 (three) times daily between meals.   fluticasone 50 MCG/ACT nasal spray Commonly known as: FLONASE Place 2 sprays into both nostrils daily as needed for allergies or rhinitis.   HYDROcodone-acetaminophen 5-325 MG tablet Commonly known as: NORCO/VICODIN Take 1  tablet by mouth every 6 (six) hours as needed.   loratadine 10 MG tablet Commonly known as: Allergy Relief Take 1 tablet (10 mg total) by mouth daily.   megestrol 40 MG tablet Commonly known as: MEGACE Take 40 mg by mouth daily.   omega-3 acid ethyl esters 1 g capsule Commonly known as: LOVAZA Take 1 g by mouth daily.   polyethylene glycol powder 17 GM/SCOOP powder Commonly known as: GLYCOLAX/MIRALAX Take 17 g by mouth 2 (two) times daily. Mix in 4-8 ounces of fluid prior to taking.   senna-docusate 8.6-50 MG tablet Commonly known as: Senokot-S Take 1 tablet by mouth at bedtime as needed for mild constipation.   Trelegy Ellipta 100-62.5-25 MCG/INH Aepb Generic drug: Fluticasone-Umeclidin-Vilant Inhale 1 puff into the lungs daily.   vitamin B-12 500 MCG tablet Commonly known as: CYANOCOBALAMIN Take 500 mcg by mouth daily.   vitamin C 250 MG tablet Commonly known as: ASCORBIC ACID Take 500 mg by mouth daily.   vitamin E 180 MG (400 UNITS) capsule Generic drug: vitamin E Take 400 Units by mouth daily.            Durable Medical Equipment  (From admission, onward)         Start     Ordered   06/30/19 1142  For home use only DME oxygen  Once    Question Answer Comment  Length of Need Lifetime   Mode or (Route) Nasal cannula   Liters per Minute 2   Frequency Continuous (stationary and portable oxygen unit needed)   Oxygen conserving device Yes   Oxygen delivery system Gas      06/30/19 1141   06/29/19 1537  DME Oxygen  (Discharge Planning)  Once    Question Answer Comment  Length of Need Lifetime   Mode or (Route) Nasal cannula   Liters per Minute 2   Frequency Continuous (stationary and portable oxygen unit needed)   Oxygen conserving device Yes   Oxygen delivery system Gas      06/29/19 1536           DISCHARGE INSTRUCTIONS:   Follow-up PMD 5 days Follow-up oncology 4 days Follow-up cardiology 1 week  If you experience worsening of your  admission symptoms, develop shortness of breath, life threatening emergency, suicidal or homicidal thoughts you must seek medical attention immediately by calling 911 or calling your MD immediately  if symptoms less severe.  You Must read complete instructions/literature along with all the possible adverse reactions/side effects for all the Medicines you take and that have been prescribed to you. Take any new Medicines after you have completely understood and accept all the possible adverse reactions/side effects.   Please note  You were cared for by a hospitalist during your hospital stay. If you have any questions about your discharge medications or the care you received while you were in the hospital after you are discharged, you can call the unit and asked to speak with the hospitalist on call if the  hospitalist that took care of you is not available. Once you are discharged, your primary care physician will handle any further medical issues. Please note that NO REFILLS for any discharge medications will be authorized once you are discharged, as it is imperative that you return to your primary care physician (or establish a relationship with a primary care physician if you do not have one) for your aftercare needs so that they can reassess your need for medications and monitor your lab values.    Today   CHIEF COMPLAINT:   Sent over from PET scan for a low sugar on fingerstick.  HISTORY OF PRESENT ILLNESS:  Daisean Brodhead  is a 79 y.o. male admitted with postobstructive pneumonia   VITAL SIGNS:  Blood pressure 136/76, pulse 82, temperature 97.6 F (36.4 C), temperature source Oral, resp. rate 16, height 5\' 8"  (1.727 m), weight 43.5 kg, SpO2 92 %.  I/O:    Intake/Output Summary (Last 24 hours) at 06/30/2019 1430 Last data filed at 06/30/2019 1037 Gross per 24 hour  Intake 75 ml  Output 500 ml  Net -425 ml    PHYSICAL EXAMINATION:  GENERAL:  79 y.o.-year-old patient lying in the bed  with no acute distress.  EYES: Pupils equal, round, reactive to light and accommodation. No scleral icterus. Extraocular muscles intact.  HEENT: Head atraumatic, normocephalic. Oropharynx and nasopharynx clear.  NECK:  Supple, no jugular venous distention. No thyroid enlargement, no tenderness.  LUNGS: Normal breath sounds bilaterally, no wheezing, rales,rhonchi or crepitation. No use of accessory muscles of respiration.  CARDIOVASCULAR: S1, S2 normal. No murmurs, rubs, or gallops.  ABDOMEN: Soft, non-tender, non-distended. Bowel sounds present. No organomegaly or mass.  EXTREMITIES: No pedal edema, cyanosis, or clubbing.  NEUROLOGIC: Cranial nerves II through XII are intact. PSYCHIATRIC: The patient is alert and oriented x 3.  SKIN: No obvious rash, lesion, or ulcer.   DATA REVIEW:   CBC Recent Labs  Lab 06/30/19 1306  WBC 19.3*  HGB 10.3*  HCT 31.9*  PLT 614*    Chemistries  Recent Labs  Lab 06/26/19 0404 06/27/19 0356 06/29/19 0432 06/29/19 0432 06/30/19 1306  NA 136   < > 137   < > 134*  K 3.9   < > 3.9   < > 4.4  CL 106   < > 106   < > 103  CO2 21*   < > 21*   < > 18*  GLUCOSE 104*   < > 99   < > 133*  BUN 25*   < > 13   < > 12  CREATININE 0.84   < > 0.74   < > 0.71  CALCIUM 8.0*   < > 7.3*   < > 7.9*  MG 1.7  --   --   --   --   AST 22   < > 26  --   --   ALT 20   < > 45*  --   --   ALKPHOS 68   < > 64  --   --   BILITOT 1.0   < > 0.6  --   --    < > = values in this interval not displayed.    Microbiology Results  Results for orders placed or performed during the hospital encounter of 06/25/19  Blood culture (routine x 2)     Status: None   Collection Time: 06/25/19  8:58 PM   Specimen: BLOOD  Result Value Ref Range Status  Specimen Description BLOOD LEFT ASSIST CONTROL  Final   Special Requests   Final    BOTTLES DRAWN AEROBIC AND ANAEROBIC Blood Culture adequate volume   Culture   Final    NO GROWTH 5 DAYS Performed at [facility], [address] [place], [place] [address]    Report Status 06/30/2019 FINAL  Final  Blood culture (routine x 2)     Status: None   Collection Time: 06/25/19  8:58 PM   Specimen: BLOOD  Result Value Ref Range Status   Specimen Description BLOOD LEFT ASSIST CONTROL  Final   Special Requests   Final    BOTTLES DRAWN AEROBIC AND ANAEROBIC Blood Culture results may not be optimal due to an excessive volume of blood received in culture bottles   Culture   Final    NO GROWTH 5 DAYS Performed at [facility], [address] [place], [place] [address]    Report Status 06/30/2019 FINAL  Final  MRSA PCR Screening     Status: None   Collection Time: 06/26/19  2:06 PM   Specimen: Nasal Mucosa; Nasopharyngeal  Result Value Ref Range Status   MRSA by PCR NEGATIVE NEGATIVE Final    Comment:        The GeneXpert MRSA Assay (FDA approved for NASAL specimens only), is one component of a comprehensive MRSA colonization surveillance program. It is not intended to diagnose MRSA infection nor to guide or monitor treatment for MRSA infections. Performed at [facility], [address] [place],  [address]     RADIOLOGY:  DG C-Arm 1-60 Min-No Report  Result Date: 06/30/2019 Fluoroscopy was utilized by the requesting physician.  No radiographic interpretation.    Management plans discussed with the patient, family and they are in agreement.  CODE STATUS:     Code Status Orders  (From admission, onward)         Start     Ordered   06/27/19 0827  Do not attempt resuscitation (DNR)  Continuous    Question Answer Comment  In the event of cardiac or respiratory ARREST Do not call a "code blue"   In the event of cardiac or respiratory ARREST Do not perform Intubation, CPR, defibrillation or ACLS   In the event of cardiac or respiratory ARREST Use medication by any route, position, wound care, and other measures to relive pain and suffering. May use oxygen, suction and  manual treatment of airway obstruction as needed for comfort.      06/27/19 0826        Code Status History    Date Active Date Inactive Code Status Order ID Comments User Context   06/25/2019 2208 06/27/2019 0826 Full Code [id number]  Toy Baker, MD ED   06/18/2019 0011 06/19/2019 2110 DNR [id number]  Athena Masse, MD ED   06/17/2019 2244 06/18/2019 0011 Full Code [id number]  Athena Masse, MD ED   05/23/2019 1509 05/24/2019 1703 Full Code [id number]  Edwin Dada, MD ED   05/08/2019 2323 05/12/2019 2244 Full Code [id number]  Mansy, Arvella Merles, MD ED   Advance Care Planning Activity    Advance Directive Documentation     Most Recent Value  Type of Advance Directive  Healthcare Power of Attorney  Pre-existing out of facility DNR order (yellow form or pink MOST form)  --  "MOST" Form in Place?  --      TOTAL TIME TAKING CARE OF THIS PATIENT: 35 minutes.  Loletha Grayer M.D on 06/30/2019 at 2:30 PM  Between 7am to 6pm - Pager - [phone]  After 6pm go to [email or web address] - password [email or web address]  [facility]  CC: Primary care physician; [name], MD

## 2019-07-01 LAB — SURGICAL PATHOLOGY

## 2019-07-01 LAB — GLUCOSE, CAPILLARY: Glucose-Capillary: 35 mg/dL — CL (ref 70–99)

## 2019-07-01 LAB — CYTOLOGY - NON PAP

## 2019-07-02 LAB — CULTURE, BAL-QUANTITATIVE W GRAM STAIN
Culture: NO GROWTH
Special Requests: NORMAL

## 2019-07-05 NOTE — Progress Notes (Deleted)
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  INTERVAL HISTORY: Patient is a 79 year old male who was initially evaluated in the hospital.  He was admitted with significant weight loss, declining performance status, and persistent weakness and fatigue.  He was found to have a significantly elevated calcium level as well as a left lower lobe lung mass.  Patient continues to have a poor appetite.  He is also complaining of persistent rectal pain.  He has no neurologic complaints.  He denies any fevers.  He has no chest pain, shortness of breath, cough, or hemoptysis.  He denies any nausea, vomiting, or constipation.  He does complain of occasional diarrhea.  He has no urinary complaints.  Patient feels generally terrible, but offers no further specific complaints today.  REVIEW OF SYSTEMS:   Review of Systems  Constitutional: Positive for malaise/fatigue and weight loss. Negative for fever.  Respiratory: Negative.  Negative for cough and hemoptysis.   Cardiovascular: Negative.  Negative for chest pain and leg swelling.  Gastrointestinal: Positive for diarrhea. Negative for abdominal pain, blood in stool, constipation and melena.       Rectal pain.  Genitourinary: Negative for dysuria.  Musculoskeletal: Negative.  Negative for back pain.  Skin: Negative.  Negative for rash.  Neurological: Positive for weakness. Negative for dizziness, focal weakness and headaches.  Psychiatric/Behavioral: The patient is not nervous/anxious.     As per HPI. Otherwise, a complete review of systems is negative.  PAST MEDICAL HISTORY: Past Medical History:  Diagnosis Date  . Atrial flutter ([place])    CHADsVASc 3  . Hypertension     PAST SURGICAL HISTORY: Past Surgical History:  Procedure Laterality Date  . KNEE SURGERY Right 1957  . VIDEO BRONCHOSCOPY WITH ENDOBRONCHIAL NAVIGATION N/A 06/30/2019   Procedure: VIDEO BRONCHOSCOPY WITH ENDOBRONCHIAL NAVIGATION;  Surgeon: Ottie Glazier, MD;  Location: [facility] ORS;  Service: Thoracic;  Laterality: N/A;  . VIDEO BRONCHOSCOPY WITH ENDOBRONCHIAL ULTRASOUND N/A 06/30/2019   Procedure: VIDEO BRONCHOSCOPY WITH ENDOBRONCHIAL ULTRASOUND;  Surgeon: Ottie Glazier, MD;  Location: [facility] ORS;  Service: Thoracic;  Laterality: N/A;    FAMILY HISTORY: Family History  Problem Relation Age of Onset  . Emphysema Father   . Lung cancer Maternal Uncle     ADVANCED DIRECTIVES (Y/N):  N  HEALTH MAINTENANCE: Social History   Tobacco Use  . Smoking status: Former Smoker    Packs/day: 0.50    Years: 60.00    Pack years: 30.00    Types: Cigarettes    Quit date: 2015    Years since quitting: 6.1  . Smokeless tobacco: Never Used  Substance Use Topics  . Alcohol use: No  . Drug use: No     Colonoscopy:  PAP:  Bone density:  Lipid panel:  No Known Allergies  Current Outpatient Medications  Medication Sig Dispense Refill  . albuterol (VENTOLIN HFA) 108 (90 Base) MCG/ACT inhaler Inhale 2 puffs into the lungs See admin instructions. Inhale 2 puffs into the lungs every 3 hours as needed for wheezing.    Marland Kitchen amiodarone (PACERONE) 200 MG tablet Take 1 tablet (200 mg total) by mouth 2 (two) times daily. 60 tablet 0  . aspirin 325 MG tablet Take 1 tablet by mouth daily.    Marland Kitchen  cefdinir (OMNICEF) 300 MG capsule Take 1 capsule (300 mg total) by mouth 2 (two) times daily. 4 capsule 0  . [facility] GLYCERIN ADULT 2 g suppository SMARTSIG:1 SUPPOS Rectally As Needed    . dexamethasone (DECADRON) 4 MG tablet Take 1 tablet (4 mg total) by mouth daily. 30 tablet 1  . esomeprazole (NEXIUM) 20 MG capsule Take 20 mg by mouth daily at 12 noon.    . feeding supplement, ENSURE  ENLIVE, (ENSURE ENLIVE) LIQD Take 237 mLs by mouth 3 (three) times daily between meals. 21330 mL 0  . fluticasone (FLONASE) 50 MCG/ACT nasal spray Place 2 sprays into both nostrils daily as needed for allergies or rhinitis.    . Fluticasone-Umeclidin-Vilant (TRELEGY ELLIPTA) 100-62.5-25 MCG/INH AEPB Inhale 1 puff into the lungs daily.    Marland Kitchen HYDROcodone-acetaminophen (NORCO/VICODIN) 5-325 MG tablet Take 1 tablet by mouth every 6 (six) hours as needed. 30 tablet 0  . loratadine (ALLERGY RELIEF) 10 MG tablet Take 1 tablet (10 mg total) by mouth daily. 30 tablet 1  . megestrol (MEGACE) 40 MG tablet Take 40 mg by mouth daily.    Marland Kitchen omega-3 acid ethyl esters (LOVAZA) 1 g capsule Take 1 g by mouth daily.    . polyethylene glycol powder (GLYCOLAX/MIRALAX) 17 GM/SCOOP powder Take 17 g by mouth 2 (two) times daily. Mix in 4-8 ounces of fluid prior to taking.    . senna-docusate (SENOKOT-S) 8.6-50 MG tablet Take 1 tablet by mouth at bedtime as needed for mild constipation. 30 tablet 0  . vitamin B-12 (CYANOCOBALAMIN) 500 MCG tablet Take 500 mcg by mouth daily.    . vitamin C (ASCORBIC ACID) 250 MG tablet Take 500 mg by mouth daily.     . vitamin E (VITAMIN E) 400 UNIT capsule Take 400 Units by mouth daily.     No current facility-administered medications for this visit.    OBJECTIVE: There were no vitals filed for this visit.   There is no height or weight on file to calculate BMI.    ECOG FS:2 - Symptomatic, <50% confined to bed  General: Thin, no acute distress. Eyes: Pink conjunctiva, anicteric sclera. HEENT: Normocephalic, moist mucous membranes. Lungs: No audible wheezing or coughing. Heart: Regular rate and rhythm. Abdomen: Soft, nontender, no obvious distention. Musculoskeletal: No edema, cyanosis, or clubbing. Neuro: Alert, answering all questions appropriately. Cranial nerves grossly intact. Skin: No rashes or petechiae noted. Psych: Normal affect. Lymphatics: No cervical, calvicular,  axillary or inguinal LAD.   LAB RESULTS:  Lab Results  Component Value Date   NA 134 (L) 06/30/2019   K 4.4 06/30/2019   CL 103 06/30/2019   CO2 18 (L) 06/30/2019   GLUCOSE 133 (H) 06/30/2019   BUN 12 06/30/2019   CREATININE 0.71 06/30/2019   CALCIUM 7.9 (L) 06/30/2019   PROT 5.4 (L) 06/29/2019   ALBUMIN 2.2 (L) 06/29/2019   AST 26 06/29/2019   ALT 45 (H) 06/29/2019   ALKPHOS 64 06/29/2019   BILITOT 0.6 06/29/2019   GFRNONAA >60 06/30/2019   GFRAA >60 06/30/2019    Lab Results  Component Value Date   WBC 19.3 (H) 06/30/2019   NEUTROABS 12.1 (H) 06/24/2019   HGB 10.3 (L) 06/30/2019   HCT 31.9 (L) 06/30/2019   MCV 84.8 06/30/2019   PLT 614 (H) 06/30/2019     STUDIES: DG Chest 2 View  Result Date: 06/17/2019 CLINICAL DATA:  Dizziness and shortness of breath. EXAM: CHEST - 2 VIEW COMPARISON:  05/23/2019. FINDINGS: Similar appearance of volume  loss in the left hemithorax with elevation of the left hemidiaphragm and left base collapse/consolidative opacity. There is diffuse interstitial and pleural opacity in the left chest. Right lung is hyperexpanded without focal consolidation or pleural effusion. Bones are diffusely demineralized. IMPRESSION: Largely stable exam. Marked volume loss left hemithorax with posterior collapse/consolidative change in diffuse pleural thickening. Electronically Signed   By: Misty Stanley M.D.   On: 06/17/2019 16:43   CT Angio Chest PE W/Cm &/Or Wo Cm  Result Date: 06/17/2019 CLINICAL DATA:  Shortness of breath EXAM: CT ANGIOGRAPHY CHEST WITH CONTRAST TECHNIQUE: Multidetector CT imaging of the chest was performed using the standard protocol during bolus administration of intravenous contrast. Multiplanar CT image reconstructions and MIPs were obtained to evaluate the vascular anatomy. CONTRAST:  57mL OMNIPAQUE IOHEXOL 350 MG/ML SOLN COMPARISON:  03/06/2019 FINDINGS: Cardiovascular: No filling defects in the pulmonary arteries to suggest pulmonary  emboli. Heart is normal size. Aorta normal caliber. Aortic and coronary artery atherosclerosis. Mediastinum/Nodes: There is pneumomediastinum. Gas seen around the main pulmonary artery and in the posterior mediastinum. No adenopathy. Lungs/Pleura: Volume loss on the left with elevation of the left hemidiaphragm. Findings are chronic. There is a chronic masslike area of consolidation in the left lower lobe which is similar to prior study. Advanced centrilobular emphysema and areas of scarring in both lungs. Possible small left effusion, stable. Upper Abdomen: Imaging into the upper abdomen shows no acute findings. Musculoskeletal: Cachexia.  No acute bony abnormality. Review of the MIP images confirms the above findings. IMPRESSION: Pneumomediastinum seen in the middle and posterior mediastinum. No associated pneumothorax. Advanced centrilobular emphysema. Stable chronic volume loss on the left with elevation of the left hemidiaphragm and masslike consolidation in the left lower lobe. As recommended on prior study, bronchoscopy with attention to the left lower lobe and/or PET CT may be helpful to exclude left lower lobe mass lesion. Small right pleural effusion. Areas of scarring in the lungs bilaterally. Aortic Atherosclerosis (ICD10-I70.0) and Emphysema (ICD10-J43.9). Electronically Signed   By: Rolm Baptise M.D.   On: 06/17/2019 21:12   CT Abdomen Pelvis W Contrast  Result Date: 06/17/2019 CLINICAL DATA:  Hypercalcemia. EXAM: CT ABDOMEN AND PELVIS WITH CONTRAST TECHNIQUE: Multidetector CT imaging of the abdomen and pelvis was performed using the standard protocol following bolus administration of intravenous contrast. CONTRAST:  4mL OMNIPAQUE IOHEXOL 350 MG/ML SOLN COMPARISON:  None. FINDINGS: Lower chest: Masslike opacity seen in the left lower lobe as described on prior chest CT. Trace left pleural effusion. Volume loss on the left with elevation of the left hemidiaphragm. Advanced emphysema. Scarring in  the right lower lobe. Hepatobiliary: No focal hepatic abnormality. Gallbladder unremarkable. Pancreas: Calcifications throughout the pancreas compatible with chronic pancreatitis. No evidence of acute pancreatitis, fall suspicious pancreatic lesion or pancreatic ductal dilatation. Spleen: No focal abnormality.  Normal size. Adrenals/Urinary Tract: Small cysts in the left kidney. No hydronephrosis. Adrenal glands and urinary bladder unremarkable. Stomach/Bowel: Large stool burden throughout the colon. Distention of the rectum with stool concerning for fecal impaction. Stomach and small bowel decompressed, unremarkable. Vascular/Lymphatic: Aortic atherosclerosis. No enlarged abdominal or pelvic lymph nodes. Reproductive: Prostate prominence. Other: No free fluid or free air. Musculoskeletal: Cachexia. No acute bony abnormality. Degenerative disc and facet disease in the lower lumbar spine. IMPRESSION: Masslike opacity in the left lower lobe as described on chest CT concerning for possible pulmonary mass lesions/malignancy. This could be further evaluated with bronchoscopy and/or PET CT as described on prior chest CT. Advanced emphysema. Large stool burden  throughout the colon. Appearance is concerning for fecal impaction. Chronic pancreatitis changes.  No evidence of acute pancreatitis. Aortic atherosclerosis. Electronically Signed   By: Rolm Baptise M.D.   On: 06/17/2019 21:16   DG Chest Port 1 View  Result Date: 06/25/2019 CLINICAL DATA:  Weakness, COVID-19 positive EXAM: PORTABLE CHEST 1 VIEW COMPARISON:  06/17/2019 FINDINGS: Chronic volume loss with consolidation in the left hemithorax, stable from prior. There may be mildly increased opacity within the aerated portion of the left lung. Heart size is stable. Mediastinal structures remain shifted to the left. There is hyperexpansion of the right lung field with chronic emphysematous changes and right apical scarring. No pneumothorax. IMPRESSION: Chronic volume  loss with consolidation in the left hemithorax, stable from prior. There may be mildly increased opacity within the aerated portion of the left lung, which could reflect pneumonia given the patient's history. Electronically Signed   By: Davina Poke D.O.   On: 06/25/2019 13:31   DG C-Arm 1-60 Min-No Report  Result Date: 06/30/2019 Fluoroscopy was utilized by the requesting physician.  No radiographic interpretation.   ECHOCARDIOGRAM COMPLETE  Result Date: 06/26/2019   ECHOCARDIOGRAM REPORT   Patient Name:   Corey Carr Date of Exam: 06/26/2019 Medical Rec #:  [id number]       Height:       68.0 in Accession #:    [id number]      Weight:       92.2 lb Date of Birth:  Mar 01, 1941       BSA:          1.47 m Patient Age:    33 years        BP:           89/66 mmHg Patient Gender: M               HR:           129 bpm. Exam Location:  [facility] Procedure: 2D Echo Indications:     ATRIAL FIBRILLATION 427.31/I48.91  History:         Patient has no prior history of Echocardiogram examinations.                  COPD; Risk Factors:Hypertension.  Sonographer:     Avanell Shackleton Referring Phys:  [place] Diagnosing Phys: Ida Rogue MD  Sonographer Comments: Image acquisition challenging due to patient body habitus. IMPRESSIONS  1. Left ventricular ejection fraction, by visual estimation, is 50 to 55%. The left ventricle has normal function. There is moderately increased left ventricular hypertrophy.  2. Left ventricular diastolic parameters are indeterminate.  3. The left ventricle has no regional wall motion abnormalities.  4. Global right ventricle has normal systolic function.The right ventricular size is normal. No increase in right ventricular wall thickness.  5. Left atrial size was normal.  6. Tricuspid valve regurgitation is mild-moderate.  7. Mildly elevated pulmonary artery systolic pressure.  8. Tachycardia noted/atrial flutter, rate 130 bpm FINDINGS  Left Ventricle: Left ventricular ejection  fraction, by visual estimation, is 50 to 55%. The left ventricle has normal function. The left ventricle has no regional wall motion abnormalities. There is moderately increased left ventricular hypertrophy. Left ventricular diastolic parameters are indeterminate. Normal left atrial pressure. Right Ventricle: The right ventricular size is normal. No increase in right ventricular wall thickness. Global RV systolic function is has normal systolic function. The tricuspid regurgitant velocity is 2.90 m/s, and with an assumed right atrial pressure  of  5 mmHg, the estimated right ventricular systolic pressure is mildly elevated at 38.6 mmHg. Left Atrium: Left atrial size was normal in size. Right Atrium: Right atrial size was normal in size Pericardium: There is no evidence of pericardial effusion. Mitral Valve: The mitral valve is normal in structure. Mild to moderate mitral valve regurgitation. No evidence of mitral valve stenosis by observation. Tricuspid Valve: The tricuspid valve is normal in structure. Tricuspid valve regurgitation is mild-moderate. Aortic Valve: The aortic valve is normal in structure. Aortic valve regurgitation is not visualized. Mild to moderate aortic valve sclerosis/calcification is present, without any evidence of aortic stenosis. Pulmonic Valve: The pulmonic valve was normal in structure. Pulmonic valve regurgitation is not visualized. Pulmonic regurgitation is not visualized. Aorta: The aortic root, ascending aorta and aortic arch are all structurally normal, with no evidence of dilitation or obstruction. Venous: The inferior vena cava is normal in size with greater than 50% respiratory variability, suggesting right atrial pressure of 3 mmHg. IAS/Shunts: No atrial level shunt detected by color flow Doppler. There is no evidence of a patent foramen ovale. No ventricular septal defect is seen or detected. There is no evidence of an atrial septal defect.  LEFT VENTRICLE PLAX 2D LVIDd:          2.60 cm LVIDs:         2.26 cm LV PW:         0.68 cm LV IVS:        0.92 cm LVOT diam:     1.70 cm LV SV:         7 ml LV SV Index:   5.30 LVOT Area:     2.27 cm  LEFT ATRIUM             Index       RIGHT ATRIUM           Index LA diam:        2.70 cm 1.84 cm/m  RA Area:     16.00 cm LA Vol (A2C):   58.4 ml 39.72 ml/m RA Volume:   45.70 ml  31.08 ml/m LA Vol (A4C):   33.6 ml 22.85 ml/m LA Biplane Vol: 48.1 ml 32.71 ml/m   AORTA Ao Root diam: 3.30 cm TRICUSPID VALVE TR Peak grad:   33.6 mmHg TR Vmax:        305.00 cm/s  SHUNTS Systemic Diam: 1.70 cm  Ida Rogue MD Electronically signed by Ida Rogue MD Signature Date/Time: 06/26/2019/7:28:13 PM    Final    [place] EKG SITE RITE  Result Date: 06/26/2019 If Site Rite image not attached, placement could not be confirmed due to current cardiac rhythm.   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  PLAN:    1.  Squamous cell carcinoma of left lower lobe lung: CT scan results from June 16, 2018 reviewed independently and reported as above.  Have ordered PET scan as well as CT-guided biopsy for further evaluation and diagnosis.  Patient will also be discussed at tumor board this coming Thursday.  2.  Hypercalcemia: Likely secondary to malignancy.  Resolved.  Patient received 4 mg IV Zometa on June 18, 2019. 3.  Anemia: Hemoglobin has trended up slightly to 10.3, monitor. 4.  Leukocytosis: Likely reactive, monitor. 5.  Thrombocytosis: Likely reactive, monitor. 6.  Poor appetite/weight loss: Patient was given a prescription for dexamethasone 4 mg daily. 7.  Rectal pain: Unclear etiology.  Patient was given a prescription for  Vicodin today.  PET scan as above for further evaluation.   Patient expressed understanding and was in agreement with this plan. He also understands that He can call clinic at any time with any questions, concerns, or complaints.   Cancer Staging No matching staging  information was found for the patient.  Lloyd Huger, MD   07/05/2019 11:04 AM

## 2019-07-07 ENCOUNTER — Inpatient Hospital Stay: Payer: Medicare Other | Admitting: Oncology

## 2019-07-08 ENCOUNTER — Inpatient Hospital Stay
Admission: EM | Admit: 2019-07-08 | Discharge: 2019-07-16 | DRG: 193 | Disposition: A | Discharge status: Home Health | Payer: Medicare Other | Attending: Internal Medicine | Admitting: Internal Medicine

## 2019-07-08 ENCOUNTER — Emergency Department: Payer: Medicare Other

## 2019-07-08 ENCOUNTER — Other Ambulatory Visit: Payer: Self-pay

## 2019-07-08 DIAGNOSIS — R6251 Failure to thrive (child): Secondary | ICD-10-CM | POA: Diagnosis present

## 2019-07-08 DIAGNOSIS — Z66 Do not resuscitate: Secondary | ICD-10-CM | POA: Diagnosis present

## 2019-07-08 DIAGNOSIS — Z7982 Long term (current) use of aspirin: Secondary | ICD-10-CM

## 2019-07-08 DIAGNOSIS — Z801 Family history of malignant neoplasm of trachea, bronchus and lung: Secondary | ICD-10-CM

## 2019-07-08 DIAGNOSIS — K5900 Constipation, unspecified: Secondary | ICD-10-CM | POA: Diagnosis not present

## 2019-07-08 DIAGNOSIS — Z87891 Personal history of nicotine dependence: Secondary | ICD-10-CM

## 2019-07-08 DIAGNOSIS — C3432 Malignant neoplasm of lower lobe, left bronchus or lung: Secondary | ICD-10-CM | POA: Diagnosis not present

## 2019-07-08 DIAGNOSIS — R6510 Systemic inflammatory response syndrome (SIRS) of non-infectious origin without acute organ dysfunction: Secondary | ICD-10-CM | POA: Diagnosis present

## 2019-07-08 DIAGNOSIS — R531 Weakness: Secondary | ICD-10-CM | POA: Diagnosis not present

## 2019-07-08 DIAGNOSIS — Z85118 Personal history of other malignant neoplasm of bronchus and lung: Secondary | ICD-10-CM | POA: Diagnosis not present

## 2019-07-08 DIAGNOSIS — E43 Unspecified severe protein-calorie malnutrition: Secondary | ICD-10-CM | POA: Diagnosis not present

## 2019-07-08 DIAGNOSIS — Z79899 Other long term (current) drug therapy: Secondary | ICD-10-CM

## 2019-07-08 DIAGNOSIS — E860 Dehydration: Secondary | ICD-10-CM | POA: Diagnosis present

## 2019-07-08 DIAGNOSIS — Z515 Encounter for palliative care: Secondary | ICD-10-CM

## 2019-07-08 DIAGNOSIS — J449 Chronic obstructive pulmonary disease, unspecified: Secondary | ICD-10-CM | POA: Diagnosis not present

## 2019-07-08 DIAGNOSIS — D638 Anemia in other chronic diseases classified elsewhere: Secondary | ICD-10-CM | POA: Diagnosis not present

## 2019-07-08 DIAGNOSIS — R64 Cachexia: Secondary | ICD-10-CM | POA: Diagnosis present

## 2019-07-08 DIAGNOSIS — J159 Unspecified bacterial pneumonia: Secondary | ICD-10-CM | POA: Diagnosis not present

## 2019-07-08 DIAGNOSIS — I480 Paroxysmal atrial fibrillation: Secondary | ICD-10-CM | POA: Diagnosis present

## 2019-07-08 DIAGNOSIS — Z20822 Contact with and (suspected) exposure to covid-19: Secondary | ICD-10-CM | POA: Diagnosis present

## 2019-07-08 DIAGNOSIS — Z8616 Personal history of COVID-19: Secondary | ICD-10-CM | POA: Diagnosis not present

## 2019-07-08 DIAGNOSIS — Z7951 Long term (current) use of inhaled steroids: Secondary | ICD-10-CM | POA: Diagnosis not present

## 2019-07-08 DIAGNOSIS — R627 Adult failure to thrive: Secondary | ICD-10-CM | POA: Diagnosis not present

## 2019-07-08 DIAGNOSIS — C3492 Malignant neoplasm of unspecified part of left bronchus or lung: Secondary | ICD-10-CM | POA: Diagnosis not present

## 2019-07-08 DIAGNOSIS — R7989 Other specified abnormal findings of blood chemistry: Secondary | ICD-10-CM

## 2019-07-08 DIAGNOSIS — E538 Deficiency of other specified B group vitamins: Secondary | ICD-10-CM | POA: Diagnosis present

## 2019-07-08 DIAGNOSIS — Z9981 Dependence on supplemental oxygen: Secondary | ICD-10-CM

## 2019-07-08 DIAGNOSIS — J982 Interstitial emphysema: Secondary | ICD-10-CM | POA: Diagnosis present

## 2019-07-08 DIAGNOSIS — Z681 Body mass index (BMI) 19 or less, adult: Secondary | ICD-10-CM | POA: Diagnosis not present

## 2019-07-08 DIAGNOSIS — Z825 Family history of asthma and other chronic lower respiratory diseases: Secondary | ICD-10-CM

## 2019-07-08 DIAGNOSIS — R0602 Shortness of breath: Secondary | ICD-10-CM | POA: Diagnosis present

## 2019-07-08 DIAGNOSIS — I10 Essential (primary) hypertension: Secondary | ICD-10-CM | POA: Diagnosis present

## 2019-07-08 DIAGNOSIS — N179 Acute kidney failure, unspecified: Secondary | ICD-10-CM | POA: Diagnosis present

## 2019-07-08 DIAGNOSIS — G9341 Metabolic encephalopathy: Secondary | ICD-10-CM | POA: Diagnosis present

## 2019-07-08 DIAGNOSIS — J9621 Acute and chronic respiratory failure with hypoxia: Secondary | ICD-10-CM | POA: Diagnosis not present

## 2019-07-08 DIAGNOSIS — R0600 Dyspnea, unspecified: Secondary | ICD-10-CM

## 2019-07-08 LAB — COMPREHENSIVE METABOLIC PANEL
ALT: 35 U/L (ref 0–44)
AST: 19 U/L (ref 15–41)
Albumin: 3.2 g/dL — ABNORMAL LOW (ref 3.5–5.0)
Alkaline Phosphatase: 63 U/L (ref 38–126)
Anion gap: 14 (ref 5–15)
BUN: 26 mg/dL — ABNORMAL HIGH (ref 8–23)
CO2: 21 mmol/L — ABNORMAL LOW (ref 22–32)
Calcium: 9.2 mg/dL (ref 8.9–10.3)
Chloride: 101 mmol/L (ref 98–111)
Creatinine, Ser: 1.18 mg/dL (ref 0.61–1.24)
GFR calc Af Amer: >60 mL/min (ref >60)
GFR calc non Af Amer: 59 mL/min — ABNORMAL LOW (ref >60)
Glucose, Bld: 140 mg/dL — ABNORMAL HIGH (ref 70–99)
Potassium: 4.4 mmol/L (ref 3.5–5.1)
Sodium: 136 mmol/L (ref 135–145)
Total Bilirubin: 0.9 mg/dL (ref 0.3–1.2)
Total Protein: 6.7 g/dL (ref 6.5–8.1)

## 2019-07-08 LAB — CBC WITH DIFFERENTIAL/PLATELET
Abs Immature Granulocytes: 0.14 10*3/uL — ABNORMAL HIGH (ref 0.00–0.07)
Basophils Absolute: 0 10*3/uL (ref 0.0–0.1)
Basophils Relative: 0 %
Eosinophils Absolute: 0 10*3/uL (ref 0.0–0.5)
Eosinophils Relative: 0 %
HCT: 31.5 % — ABNORMAL LOW (ref 39.0–52.0)
Hemoglobin: 10.2 g/dL — ABNORMAL LOW (ref 13.0–17.0)
Immature Granulocytes: 1 %
Lymphocytes Relative: 3 %
Lymphs Abs: 0.5 10*3/uL — ABNORMAL LOW (ref 0.7–4.0)
MCH: 27.4 pg (ref 26.0–34.0)
MCHC: 32.4 g/dL (ref 30.0–36.0)
MCV: 84.7 fL (ref 80.0–100.0)
Monocytes Absolute: 0.6 10*3/uL (ref 0.1–1.0)
Monocytes Relative: 4 %
Neutro Abs: 15.5 10*3/uL — ABNORMAL HIGH (ref 1.7–7.7)
Neutrophils Relative %: 92 %
Platelets: 655 10*3/uL — ABNORMAL HIGH (ref 150–400)
RBC: 3.72 MIL/uL — ABNORMAL LOW (ref 4.22–5.81)
RDW: 17.3 % — ABNORMAL HIGH (ref 11.5–15.5)
WBC: 16.8 10*3/uL — ABNORMAL HIGH (ref 4.0–10.5)
nRBC: 0 % (ref 0.0–0.2)

## 2019-07-08 LAB — TROPONIN I (HIGH SENSITIVITY)
Troponin I (High Sensitivity): 11 ng/L (ref <18)
Troponin I (High Sensitivity): 11 ng/L (ref <18)

## 2019-07-08 LAB — URINALYSIS, ROUTINE W REFLEX MICROSCOPIC
Bacteria, UA: NONE SEEN
Bilirubin Urine: NEGATIVE
Glucose, UA: NEGATIVE mg/dL
Hgb urine dipstick: NEGATIVE
Ketones, ur: NEGATIVE mg/dL
Leukocytes,Ua: NEGATIVE
Nitrite: NEGATIVE
Protein, ur: 30 mg/dL — AB
Specific Gravity, Urine: 1.031 — ABNORMAL HIGH (ref 1.005–1.030)
Squamous Epithelial / HPF: NONE SEEN (ref 0–5)
pH: 5 (ref 5.0–8.0)

## 2019-07-08 LAB — RESPIRATORY PANEL BY RT PCR (FLU A&B, COVID)
Influenza A by PCR: NEGATIVE
Influenza B by PCR: NEGATIVE
SARS Coronavirus 2 by RT PCR: NEGATIVE

## 2019-07-08 LAB — BRAIN NATRIURETIC PEPTIDE: B Natriuretic Peptide: 146 pg/mL — ABNORMAL HIGH (ref 0.0–100.0)

## 2019-07-08 LAB — PROCALCITONIN: Procalcitonin: 0.1 ng/mL

## 2019-07-08 LAB — LACTIC ACID, PLASMA
Lactic Acid, Venous: 3.1 mmol/L (ref 0.5–1.9)
Lactic Acid, Venous: 3.6 mmol/L (ref 0.5–1.9)

## 2019-07-08 MED ORDER — POLYETHYLENE GLYCOL 3350 17 G PO PACK
17.0000 g | PACK | Freq: Two times a day (BID) | ORAL | Status: DC
  Administered 2019-07-09 – 2019-07-15 (×10): 17 g via ORAL
  Filled 2019-07-08 (×13): qty 1

## 2019-07-08 MED ORDER — VITAMIN E 180 MG (400 UNIT) PO CAPS
400.0000 [IU] | ORAL_CAPSULE | Freq: Every day | ORAL | Status: DC
  Administered 2019-07-09 – 2019-07-16 (×8): 400 [IU] via ORAL
  Filled 2019-07-08 (×8): qty 1

## 2019-07-08 MED ORDER — ACETAMINOPHEN 325 MG PO TABS
650.0000 mg | ORAL_TABLET | Freq: Four times a day (QID) | ORAL | Status: DC | PRN
  Administered 2019-07-12 – 2019-07-14 (×4): 650 mg via ORAL
  Filled 2019-07-08 (×4): qty 2

## 2019-07-08 MED ORDER — CYANOCOBALAMIN 500 MCG PO TABS
500.0000 ug | ORAL_TABLET | Freq: Every day | ORAL | Status: DC
  Administered 2019-07-09 – 2019-07-16 (×8): 500 ug via ORAL
  Filled 2019-07-08 (×8): qty 1

## 2019-07-08 MED ORDER — DEXAMETHASONE 4 MG PO TABS
4.0000 mg | ORAL_TABLET | Freq: Every day | ORAL | Status: DC
  Administered 2019-07-09 – 2019-07-16 (×8): 4 mg via ORAL
  Filled 2019-07-08 (×8): qty 1

## 2019-07-08 MED ORDER — PANTOPRAZOLE SODIUM 40 MG PO TBEC
40.0000 mg | DELAYED_RELEASE_TABLET | Freq: Every day | ORAL | Status: DC
  Administered 2019-07-08 – 2019-07-16 (×9): 40 mg via ORAL
  Filled 2019-07-08 (×8): qty 1

## 2019-07-08 MED ORDER — ASPIRIN EC 325 MG PO TBEC
325.0000 mg | DELAYED_RELEASE_TABLET | Freq: Every day | ORAL | Status: DC
  Administered 2019-07-09 – 2019-07-16 (×8): 325 mg via ORAL
  Filled 2019-07-08 (×8): qty 1

## 2019-07-08 MED ORDER — SENNOSIDES-DOCUSATE SODIUM 8.6-50 MG PO TABS: 1.0000 | ORAL_TABLET | Freq: Every evening | ORAL | Status: DC | PRN

## 2019-07-08 MED ORDER — FLUTICASONE-UMECLIDIN-VILANT 100-62.5-25 MCG/INH IN AEPB: 1.0000 | INHALATION_SPRAY | Freq: Every day | RESPIRATORY_TRACT | Status: DC

## 2019-07-08 MED ORDER — LACTATED RINGERS IV BOLUS
1000.0000 mL | Freq: Once | INTRAVENOUS | Status: AC
  Administered 2019-07-08: 1000 mL via INTRAVENOUS

## 2019-07-08 MED ORDER — ONDANSETRON HCL 4 MG/2ML IJ SOLN: 4.0000 mg | Freq: Four times a day (QID) | INTRAMUSCULAR | Status: DC | PRN

## 2019-07-08 MED ORDER — DEXTROSE-NACL 5-0.9 % IV SOLN: INTRAVENOUS | Status: DC

## 2019-07-08 MED ORDER — OMEGA-3-ACID ETHYL ESTERS 1 G PO CAPS
1.0000 g | ORAL_CAPSULE | Freq: Every day | ORAL | Status: DC
  Administered 2019-07-10 – 2019-07-16 (×7): 1 g via ORAL
  Filled 2019-07-08 (×8): qty 1

## 2019-07-08 MED ORDER — FLUTICASONE PROPIONATE 50 MCG/ACT NA SUSP
2.0000 | Freq: Every day | NASAL | Status: DC | PRN
  Filled 2019-07-08: qty 16

## 2019-07-08 MED ORDER — HYDROCODONE-ACETAMINOPHEN 5-325 MG PO TABS
1.0000 | ORAL_TABLET | Freq: Four times a day (QID) | ORAL | Status: DC | PRN
  Administered 2019-07-12 – 2019-07-16 (×7): 1 via ORAL
  Filled 2019-07-08 (×7): qty 1

## 2019-07-08 MED ORDER — ASCORBIC ACID 500 MG PO TABS
500.0000 mg | ORAL_TABLET | Freq: Every day | ORAL | Status: DC
  Administered 2019-07-09 – 2019-07-16 (×9): 500 mg via ORAL
  Filled 2019-07-08 (×9): qty 1

## 2019-07-08 MED ORDER — IOHEXOL 350 MG/ML SOLN
75.0000 mL | Freq: Once | INTRAVENOUS | Status: AC | PRN
  Administered 2019-07-08: 19:00:00 60 mL via INTRAVENOUS

## 2019-07-08 MED ORDER — HEPARIN SODIUM (PORCINE) 5000 UNIT/ML IJ SOLN
5000.0000 [IU] | Freq: Three times a day (TID) | INTRAMUSCULAR | Status: DC
  Administered 2019-07-09 – 2019-07-16 (×21): 5000 [IU] via SUBCUTANEOUS
  Filled 2019-07-08 (×21): qty 1

## 2019-07-08 MED ORDER — ACETAMINOPHEN 650 MG RE SUPP: 650.0000 mg | Freq: Four times a day (QID) | RECTAL | Status: DC | PRN

## 2019-07-08 MED ORDER — ENSURE ENLIVE PO LIQD
237.0000 mL | Freq: Three times a day (TID) | ORAL | Status: DC
  Administered 2019-07-10 – 2019-07-15 (×8): 237 mL via ORAL

## 2019-07-08 MED ORDER — LORATADINE 10 MG PO TABS
10.0000 mg | ORAL_TABLET | Freq: Every day | ORAL | Status: DC
  Administered 2019-07-08 – 2019-07-16 (×9): 10 mg via ORAL
  Filled 2019-07-08 (×8): qty 1

## 2019-07-08 MED ORDER — ONDANSETRON HCL 4 MG PO TABS: 4.0000 mg | ORAL_TABLET | Freq: Four times a day (QID) | ORAL | Status: DC | PRN

## 2019-07-08 MED ORDER — MEGESTROL ACETATE 20 MG PO TABS
40.0000 mg | ORAL_TABLET | Freq: Every day | ORAL | Status: DC
  Administered 2019-07-09 – 2019-07-11 (×3): 40 mg via ORAL
  Filled 2019-07-08 (×3): qty 2

## 2019-07-08 MED ORDER — AMIODARONE HCL 200 MG PO TABS
200.0000 mg | ORAL_TABLET | Freq: Two times a day (BID) | ORAL | Status: DC
  Administered 2019-07-09 – 2019-07-16 (×15): 200 mg via ORAL
  Filled 2019-07-08 (×16): qty 1

## 2019-07-08 MED ORDER — ALBUTEROL SULFATE (2.5 MG/3ML) 0.083% IN NEBU: 3.0000 mL | INHALATION_SOLUTION | RESPIRATORY_TRACT | Status: DC

## 2019-07-08 NOTE — ED Notes (Signed)
Condom cath placed on pt at this time and pt provided with warm blanket. Pt resting comfortably in bed at this time, no other needs.

## 2019-07-08 NOTE — ED Notes (Signed)
Report given to [place], RN

## 2019-07-08 NOTE — H&P (Signed)
History and Physical   Corey Carr:[id number] DOB: 1941/05/11 DOA: 07/08/2019  Referring MD/NP/PA: Dr. Jari Pigg  PCP: [name], MD   Outpatient Specialists: Dr. Lanney Gins, thoracic surgeon  Patient coming from: Home  Chief Complaint: Shortness of breath  HPI: Corey Carr is a 79 y.o. male with medical history significant of lung cancer, atrial flutter, paroxysmal A. fib, chronic respiratory failure on 3 L of oxygen, remote history of COVID-19 pneumonia back in December, prior history of hypoglycemia hypertension among other things who came into the ER today with significant shortness of breath from home.  He has felt progressively more short of breath lately.  Mostly worsened with exertion.  Denied any sick contact.  Denied any fever or chills but he has had cough.  Patient was recently diagnosed with lung cancer and had a PET scan.  He has COPD but does not feel like he is wheezing.  He did have pneumomediastinum the last time he was in the hospital.  He appears to current have pneumomediastinum which has slightly worsened.  He had bronchoscopy at the time with biopsy.  Patient is generally weak and debilitated.  He has failed to thrive essentially not eating or drinking adequately.  At this point he is being admitted to the hospital for evaluation.  He has been staying home instead of skilled facility with his wife.  But he appears to be failing to thrive at home.  ED Course: Temperature 97.7 blood pressure 142/73 pulse 88 respirate 22 oxygen sat 99% on 3 L.  White count is 16.8, hemoglobin 10.2 and platelets 655.  Sodium 136 potassium 4.2 chloride 101 CO2 21 BUN 26 creatinine 1.18 calcium 9.2 and procalcitonin 0.1.  Lactic acid 3.6.  COVID-19 testing is negative.  Urinalysis essentially negative.  CT angiogram of the chest showed stable pulmonary arteries with continuous masslike opacification of the left lower lung with worsening chronic elevation of the left hemidiaphragm.  Also  increase pneumomediastinum with subcutaneous gas tracking into the lower neck.  Head CT without contrast showed no hemorrhage or chronic ischemic microangiopathy only patient being admitted to the hospital to evaluate and treat his weakness.  Review of Systems: As per HPI otherwise 10 point review of systems negative.    Past Medical History:  Diagnosis Date  . Atrial flutter ([place])    CHADsVASc 3  . Hypertension     Past Surgical History:  Procedure Laterality Date  . KNEE SURGERY Right 1957  . VIDEO BRONCHOSCOPY WITH ENDOBRONCHIAL NAVIGATION N/A 06/30/2019   Procedure: VIDEO BRONCHOSCOPY WITH ENDOBRONCHIAL NAVIGATION;  Surgeon: Ottie Glazier, MD;  Location: [facility] ORS;  Service: Thoracic;  Laterality: N/A;  . VIDEO BRONCHOSCOPY WITH ENDOBRONCHIAL ULTRASOUND N/A 06/30/2019   Procedure: VIDEO BRONCHOSCOPY WITH ENDOBRONCHIAL ULTRASOUND;  Surgeon: Ottie Glazier, MD;  Location: [facility] ORS;  Service: Thoracic;  Laterality: N/A;     reports that he quit smoking about 6 years ago. His smoking use included cigarettes. He has a 30.00 pack-year smoking history. He has never used smokeless tobacco. He reports that he does not drink alcohol or use drugs.  No Known Allergies  Family History  Problem Relation Age of Onset  . Emphysema Father   . Lung cancer Maternal Uncle      Prior to Admission medications   Medication Sig Start Date End Date Taking? Authorizing Provider  albuterol (VENTOLIN HFA) 108 (90 Base) MCG/ACT inhaler Inhale 2 puffs into the lungs See admin instructions. Inhale 2 puffs into the lungs every 3 hours  as needed for wheezing. 06/16/19 06/15/20 Yes [provider]  amiodarone (PACERONE) 200 MG tablet Take 1 tablet (200 mg total) by mouth 2 (two) times daily. 06/30/19  Yes Wieting, Richard, MD  aspirin 325 MG tablet Take 1 tablet by mouth daily.   Yes [provider]  cefdinir (OMNICEF) 300 MG capsule Take 1 capsule (300 mg total) by mouth 2 (two) times daily.  06/30/19  Yes Wieting, Richard, MD  dexamethasone (DECADRON) 4 MG tablet Take 1 tablet (4 mg total) by mouth daily. 06/24/19  Yes Lloyd Huger, MD  esomeprazole (NEXIUM) 20 MG capsule Take 20 mg by mouth daily at 12 noon.   Yes [provider]  fluticasone (FLONASE) 50 MCG/ACT nasal spray Place 2 sprays into both nostrils daily as needed for allergies or rhinitis.   Yes [provider]  Fluticasone-Umeclidin-Vilant (TRELEGY ELLIPTA) 100-62.5-25 MCG/INH AEPB Inhale 1 puff into the lungs daily.   Yes [provider]  HYDROcodone-acetaminophen (NORCO/VICODIN) 5-325 MG tablet Take 1 tablet by mouth every 6 (six) hours as needed. 06/24/19  Yes Lloyd Huger, MD  loratadine (ALLERGY RELIEF) 10 MG tablet Take 1 tablet (10 mg total) by mouth daily. 05/13/19  Yes Ezekiel Slocumb, DO  megestrol (MEGACE) 40 MG tablet Take 40 mg by mouth daily. 05/02/19  Yes [provider]  omega-3 acid ethyl esters (LOVAZA) 1 g capsule Take 1 g by mouth daily.   Yes [provider]  polyethylene glycol powder (GLYCOLAX/MIRALAX) 17 GM/SCOOP powder Take 17 g by mouth 2 (two) times daily. Mix in 4-8 ounces of fluid prior to taking. 06/09/19  Yes [provider]  senna-docusate (SENOKOT-S) 8.6-50 MG tablet Take 1 tablet by mouth at bedtime as needed for mild constipation. 06/19/19 07/19/19 Yes Sreenath, Sudheer B, MD  vitamin B-12 (CYANOCOBALAMIN) 500 MCG tablet Take 500 mcg by mouth daily.   Yes [provider]  vitamin C (ASCORBIC ACID) 250 MG tablet Take 500 mg by mouth daily.    Yes [provider]  vitamin E (VITAMIN E) 400 UNIT capsule Take 400 Units by mouth daily.   Yes [provider]  [employer] GLYCERIN ADULT 2 g suppository SMARTSIG:1 SUPPOS Rectally As Needed 06/09/19   [provider]  feeding supplement, ENSURE ENLIVE, (ENSURE ENLIVE) LIQD Take 237 mLs by mouth 3 (three) times daily between meals. 06/30/19   Loletha Grayer, MD     Physical Exam: Vitals:   07/08/19 1742 07/08/19 2030 07/08/19 2100 07/08/19 2115  BP:  139/76 135/76   Pulse:  80  77  Resp:      Temp:      TempSrc:      SpO2:  100%  100%  Weight: 43 kg     Height: _0  (1.727 m)         Constitutional: Frail, weak, appears dry Vitals:   07/08/19 1742 07/08/19 2030 07/08/19 2100 07/08/19 2115  BP:  139/76 135/76   Pulse:  80  77  Resp:      Temp:      TempSrc:      SpO2:  100%  100%  Weight: 43 kg     Height: _1  (1.727 m)      Eyes: PERRL, lids and conjunctivae normal ENMT: Mucous membranes are dry. Posterior pharynx clear of any exudate or lesions.Normal dentition.  Neck: normal, supple, no masses, no thyromegaly Respiratory: clear to auscultation bilaterally, no wheezing, no crackles. Normal respiratory effort. No accessory muscle use.  Cardiovascular: Sinus tachycardia, no murmurs / rubs / gallops. No extremity edema. 2+ pedal pulses. No carotid bruits.  Abdomen: no tenderness, no masses palpated. No hepatosplenomegaly. Bowel sounds positive.  Musculoskeletal: no clubbing / cyanosis. No joint deformity upper and lower extremities. Good ROM, no contractures. Normal muscle tone.  Skin: no rashes, lesions, ulcers. No induration Neurologic: CN 2-12 grossly intact. Sensation intact, DTR normal. Strength 5/5 in all 4.  Psychiatric: Normal judgment and insight. Alert and oriented x 3. Normal mood.     Labs on Admission: I have personally reviewed following labs and imaging studies  CBC: Recent Labs  Lab 07/08/19 1811  WBC 16.8*  NEUTROABS 15.5*  HGB 10.2*  HCT 31.5*  MCV 84.7  PLT 423*   Basic Metabolic Panel: Recent Labs  Lab 07/08/19 1811  NA 136  K 4.4  CL 101  CO2 21*  GLUCOSE 140*  BUN 26*  CREATININE 1.18  CALCIUM 9.2   GFR: Estimated Creatinine Clearance: 31.4 mL/min (by C-G formula based on SCr of 1.18 mg/dL). Liver Function Tests: Recent Labs  Lab 07/08/19 1811  AST 19  ALT 35  ALKPHOS 63   BILITOT 0.9  PROT 6.7  ALBUMIN 3.2*   No results for input(s): LIPASE, AMYLASE in the last 168 hours. No results for input(s): AMMONIA in the last 168 hours. Coagulation Profile: No results for input(s): INR, PROTIME in the last 168 hours. Cardiac Enzymes: No results for input(s): CKTOTAL, CKMB, CKMBINDEX, TROPONINI in the last 168 hours. BNP (last 3 results) No results for input(s): PROBNP in the last 8760 hours. HbA1C: No results for input(s): HGBA1C in the last 72 hours. CBG: No results for input(s): GLUCAP in the last 168 hours. Lipid Profile: No results for input(s): CHOL, HDL, LDLCALC, TRIG, CHOLHDL, LDLDIRECT in the last 72 hours. Thyroid Function Tests: No results for input(s): TSH, T4TOTAL, FREET4, T3FREE, THYROIDAB in the last 72 hours. Anemia Panel: No results for input(s): VITAMINB12, FOLATE, FERRITIN, TIBC, IRON, RETICCTPCT in the last 72 hours. Urine analysis:    Component Value Date/Time   COLORURINE AMBER (A) 07/08/2019 1845   APPEARANCEUR HAZY (A) 07/08/2019 1845   APPEARANCEUR Clear 11/14/2016 1538   LABSPEC 1.031 (H) 07/08/2019 1845   PHURINE 5.0 07/08/2019 1845   GLUCOSEU NEGATIVE 07/08/2019 1845   HGBUR NEGATIVE 07/08/2019 1845   BILIRUBINUR NEGATIVE 07/08/2019 1845   BILIRUBINUR Negative 11/14/2016 1538   [place] 07/08/2019 1845   PROTEINUR 30 (A) 07/08/2019 1845   NITRITE NEGATIVE 07/08/2019 1845   LEUKOCYTESUR NEGATIVE 07/08/2019 1845   Sepsis Labs: _0 (procalcitonin:4,lacticidven:4) ) Recent Results (from the past 240 hour(s))  Culture, bal-quantitative     Status: None   Collection Time: 06/30/19 10:09 AM   Specimen: Bronchoalveolar Lavage; Respiratory  Result Value Ref Range Status   Specimen Description   Final    BRONCHIAL ALVEOLAR LAVAGE Performed at [facility], [address] [place], [place] [address]    Special Requests   Final    Normal Performed at [facility], [address]  [place], [place] [address]    Gram Stain   Final    RARE WBC PRESENT, PREDOMINANTLY MONONUCLEAR NO ORGANISMS SEEN    Culture   Final    NO GROWTH 2 DAYS Performed at [facility], [place] [address], [place], [place] [address]    Report Status 07/02/2019 FINAL  Final  Respiratory Panel by RT PCR (Flu A&B, Covid) - Nasopharyngeal Swab     Status: None  Collection Time: 07/08/19  7:43 PM   Specimen: Nasopharyngeal Swab  Result Value Ref Range Status   SARS Coronavirus 2 by RT PCR NEGATIVE NEGATIVE Final    Comment: (NOTE) SARS-CoV-2 target nucleic acids are NOT DETECTED. The SARS-CoV-2 RNA is generally detectable in upper respiratoy specimens during the acute phase of infection. The lowest concentration of SARS-CoV-2 viral copies this assay can detect is 131 copies/mL. A negative result does not preclude SARS-Cov-2 infection and should not be used as the sole basis for treatment or other patient management decisions. A negative result may occur with  improper specimen collection/handling, submission of specimen other than nasopharyngeal swab, presence of viral mutation(s) within the areas targeted by this assay, and inadequate number of viral copies (<131 copies/mL). A negative result must be combined with clinical observations, patient history, and epidemiological information. The expected result is Negative. Fact Sheet for Patients:  PinkCheek.be Fact Sheet for Healthcare Providers:  GravelBags.it This test is not yet ap proved or cleared by the [place] FDA and  has been authorized for detection and/or diagnosis of SARS-CoV-2 by FDA under an Emergency Use Authorization (EUA). This EUA will remain  in effect (meaning this test can be used) for the duration of the COVID-19 declaration under Section 564(b)(1) of the Act, 21 U.S.C. section 360bbb-3(b)(1), unless the authorization is terminated or revoked sooner.     Influenza A by PCR NEGATIVE NEGATIVE Final   Influenza B by PCR NEGATIVE NEGATIVE Final    Comment: (NOTE) The Xpert Xpress SARS-CoV-2/FLU/RSV assay is intended as an aid in  the diagnosis of influenza from Nasopharyngeal swab specimens and  should not be used as a sole basis for treatment. Nasal washings and  aspirates are unacceptable for Xpert Xpress SARS-CoV-2/FLU/RSV  testing. Fact Sheet for Patients: PinkCheek.be Fact Sheet for Healthcare Providers: GravelBags.it This test is not yet approved or cleared by the [place] FDA and  has been authorized for detection and/or diagnosis of SARS-CoV-2 by  FDA under an Emergency Use Authorization (EUA). This EUA will remain  in effect (meaning this test can be used) for the duration of the  Covid-19 declaration under Section 564(b)(1) of the Act, 21  U.S.C. section 360bbb-3(b)(1), unless the authorization is  terminated or revoked. Performed at [facility], [address] [place], [place] [address]      Radiological Exams on Admission: CT Head Wo Contrast  Result Date: 07/08/2019 CLINICAL DATA:  Lung cancer. Assessment for hemorrhage in the setting of possible need for anticoagulation. EXAM: CT HEAD WITHOUT CONTRAST TECHNIQUE: Contiguous axial images were obtained from the base of the skull through the vertex without intravenous contrast. COMPARISON:  None. FINDINGS: Brain: There is no mass, hemorrhage or extra-axial collection. There is generalized atrophy without lobar predilection. Hypodensity of the white matter is most commonly associated with chronic microvascular disease. Vascular: No abnormal hyperdensity of the major intracranial arteries or dural venous sinuses. No intracranial atherosclerosis. Skull: The visualized skull base, calvarium and extracranial soft tissues are normal. Sinuses/Orbits: No fluid levels or advanced mucosal thickening of the visualized  paranasal sinuses. No mastoid or middle ear effusion. The orbits are normal. IMPRESSION: 1. No hemorrhage or other acute abnormality. 2. Chronic ischemic microangiopathy and generalized volume loss. Electronically Signed   By: Ulyses Jarred M.D.   On: 07/08/2019 19:37   CT Angio Chest PE W and/or Wo Contrast  Result Date: 07/08/2019 CLINICAL DATA:  79 year old male with shortness of breath, chronic oxygen. Currently being evaluated for possible  left lower lobe mass. EXAM: CT ANGIOGRAPHY CHEST WITH CONTRAST TECHNIQUE: Multidetector CT imaging of the chest was performed using the standard protocol during bolus administration of intravenous contrast. Multiplanar CT image reconstructions and MIPs were obtained to evaluate the vascular anatomy. CONTRAST:  89m OMNIPAQUE IOHEXOL 350 MG/ML SOLN COMPARISON:  CTA chest and CT Abdomen and Pelvis 06/17/2019. FINDINGS: Cardiovascular: Good contrast bolus timing in the pulmonary arterial tree. Pulmonary artery patency in the left lung appears stable since last month, with obliterated left lower lobe pulmonary arteries. No central or hilar pulmonary embolus. The right lung pulmonary arteries appear patent and within normal limits. No aortic contrast today. Calcified aortic atherosclerosis. The heart remain shifted over to the left. No cardiomegaly or pericardial effusion is evident. Mediastinum/Nodes: Increased pneumomediastinum since 06/17/2019, gas now tracking into the bilateral lower neck and also toward the core of the diaphragm. The left hilum is indistinct as before, but elsewhere no mediastinal or right hilar lymphadenopathy is identified. Lungs/Pleura: The left lower lobe bronchus more abruptly terminates now on series 7, image 44 and there is no residual more distal left lower lobe airway pneumatization as there was last month. Complete masslike opacification of the left lower lobe otherwise appears stable. Superimposed bullous emphysema, left upper lobe and right  lung base predominant scarring. An indeterminate superior segment right lower lobe 8-9 mm oval nodule is stable from last month, new since 03/05/2020. No pleural effusion or new right lung opacity. Upper Abdomen: Elevation of the left hemidiaphragm again suspected. The stomach and splenic flexure are visible on series 5, image 75. There is stool mixed with oral contrast or other dense material in the colon. Evidence of chronic calcific pancreatitis. Grossly negative visible noncontrast liver, kidneys and adrenal glands. Musculoskeletal: Osteopenia. No acute or suspicious osseous lesion identified. Review of the MIP images confirms the above findings. IMPRESSION: 1. Stable pulmonary arteries, obliterated in the left lower lung but with no evidence of acute PE. 2. Continued mass-like opacification of the left lower lung with worsening abrupt termination of the lower lobe bronchus again highly suspicious for tumor. There is superimposed chronic elevation of the left hemidiaphragm. There is increased pneumomediastinum, now with subcutaneous gas tracking into the lower neck. It appears a PET-CT was scheduled for 06/25/2019 but did not occur. 3. Underlying bullous Emphysema (ICD10-J43.9). Aortic Atherosclerosis (ICD10-I70.0). Electronically Signed   By: HGenevie AnnM.D.   On: 07/08/2019 19:36      Assessment/Plan Principal Problem:   Generalized weakness Active Problems:   Failure to thrive (0-17)   Squamous cell carcinoma of left lung (HCC)   Essential hypertension   Dehydration   Chronic obstructive pulmonary disease (HCC)   AF (paroxysmal atrial fibrillation) ([place]     #1 generalized weakness: Multifactorial.  Patient is slowly failing to thrive at home.  We will admit the patient for evaluation and supportive care.  Consider social worker consult with PT and OT evaluation.  Follow recommendations for disposition.  #2 shortness of breath: Probably secondary to patient's lung pathology with the  pneumomediastinum.  This seems to be slightly worsening.  Patient will be admitted.  Continue on oxygen and home regimen including inhalers.  No evidence of pneumonia or PE.  Pulmonary consult in the morning to help with management  #3 dehydration: Aggressively hydrate patient and monitor  #4 essential hypertension: Continue blood pressure control using home regimen.  #5 advanced COPD: On home O2.  Continue  #6 squamous cell carcinoma of the left lung: Continue treatment by  oncology.  #7 paroxysmal atrial fibrillation: Currently in sinus rhythm.  Continue monitoring   DVT prophylaxis: Heparin Code Status: DNR Family Communication: Wife over the phone Disposition Plan: To be determined Consults called: None but pulmonary consult in the morning Admission status: Inpatient  Severity of Illness: The appropriate patient status for this patient is INPATIENT. Inpatient status is judged to be reasonable and necessary in order to provide the required intensity of service to ensure the patient's safety. The patient's presenting symptoms, physical exam findings, and initial radiographic and laboratory data in the context of their chronic comorbidities is felt to place them at high risk for further clinical deterioration. Furthermore, it is not anticipated that the patient will be medically stable for discharge from the hospital within 2 midnights of admission. The following factors support the patient status of inpatient.   " The patient's presenting symptoms include shortness of breath and weakness. " The worrisome physical exam findings include frail with bilateral crackles and rails. " The initial radiographic and laboratory data are worrisome because of CT findings of worsening pneumomediastinum. " The chronic co-morbidities include squamous cell carcinoma of the lung.   * I certify that at the point of admission it is my clinical judgment that the patient will require inpatient hospital care  spanning beyond 2 midnights from the point of admission due to high intensity of service, high risk for further deterioration and high frequency of surveillance required.Barbette Merino MD [facility] Pager [phone]  If 7PM-7AM, please contact night-coverage [email or web address] Password [facility]  07/08/2019, 9:28 PM

## 2019-07-08 NOTE — ED Notes (Signed)
Upon ambulation pts monitor could not pick up accurate oxygen saturation level. Pt became very weak after taking about 5 steps and requested to return to bed. MD notified, pt comfortable in bed, VSS.

## 2019-07-08 NOTE — ED Notes (Signed)
Pt in ct 

## 2019-07-08 NOTE — ED Notes (Signed)
Pt otf for imaging 

## 2019-07-08 NOTE — ED Triage Notes (Signed)
Pt arrives via [facility] from home for c/o [facility] at rest and on exertion. PT wears 3L chronically and was 99% on 3L when EMS arrived. Pt A&Ox4 and in NAD at this time.

## 2019-07-08 NOTE — ED Provider Notes (Signed)
[facility] Emergency Department Provider Note  ____________________________________________   First MD Initiated Contact with Patient 07/08/19 1731     (approximate)  I have reviewed the triage vital signs and the nursing notes.   HISTORY  Chief Complaint Shortness of Breath    HPI Corey Carr is a 79 y.o. male with recent admission for A. fib with RVR, paroxysmal in nature on aspirin, lung mass with complication of pneumomediastinum post bronc, postobstructive pneumonia, COPD now on oxygen who comes in for shortness of breath.  Patient is on 3 L of oxygen at baseline.  States that he has felt more short of breath over the past few days, constant, worse with exertion, better at rest.  Denies any abdominal pain nausea, vomiting.  No leg swelling.  States that he is supposed to follow-up with oncology tomorrow for repeat CT scan of his chest.  Denies any fevers.  States that he lives with his wife who is a diabetic and she can help take care of him and he is just feeling more weak recently.          Past Medical History:  Diagnosis Date  . Atrial flutter ([place])    CHADsVASc 3  . Hypertension     Patient Active Problem List   Diagnosis Date Noted  . Acute on chronic respiratory failure with hypoxia ([place])   . AF (paroxysmal atrial fibrillation) ([place])   . Atrial flutter ([place]) 06/27/2019  . Pressure injury of skin 06/26/2019  . Hypoglycemia 06/25/2019  . SIRS (systemic inflammatory response syndrome) ([place]) 06/25/2019  . Postobstructive pneumonia 06/25/2019  . Leukocytosis 06/25/2019  . Acute metabolic encephalopathy 50/35/4656  . Protein-calorie malnutrition, severe 06/18/2019  . Hypercalcemia 06/17/2019  . Chronic obstructive pulmonary disease ([place]) 06/17/2019  . History of 2019 novel coronavirus disease (COVID-19) 06/17/2019  . Cancer cachexia ([place]) 06/17/2019  . Acute kidney injury ([place]) 05/24/2019  . Hyperkalemia 05/23/2019  . COVID-19  virus detected 05/10/2019  . AKI (acute kidney injury) ([place]) 05/09/2019  . Squamous cell carcinoma of left lung ([place]) 05/09/2019  . Essential hypertension 05/09/2019  . Asthma 05/09/2019  . Vitamin B12 deficiency 05/09/2019  . Dehydration 05/09/2019  . Seasonal allergies 05/09/2019  . Failure to thrive (0-17) 05/08/2019    Past Surgical History:  Procedure Laterality Date  . KNEE SURGERY Right 1957  . VIDEO BRONCHOSCOPY WITH ENDOBRONCHIAL NAVIGATION N/A 06/30/2019   Procedure: VIDEO BRONCHOSCOPY WITH ENDOBRONCHIAL NAVIGATION;  Surgeon: Ottie Glazier, MD;  Location: [facility] ORS;  Service: Thoracic;  Laterality: N/A;  . VIDEO BRONCHOSCOPY WITH ENDOBRONCHIAL ULTRASOUND N/A 06/30/2019   Procedure: VIDEO BRONCHOSCOPY WITH ENDOBRONCHIAL ULTRASOUND;  Surgeon: Ottie Glazier, MD;  Location: [facility] ORS;  Service: Thoracic;  Laterality: N/A;    Prior to Admission medications   Medication Sig Start Date End Date Taking? Authorizing Provider  albuterol (VENTOLIN HFA) 108 (90 Base) MCG/ACT inhaler Inhale 2 puffs into the lungs See admin instructions. Inhale 2 puffs into the lungs every 3 hours as needed for wheezing. 06/16/19 06/15/20  [provider]  amiodarone (PACERONE) 200 MG tablet Take 1 tablet (200 mg total) by mouth 2 (two) times daily. 06/30/19   Loletha Grayer, MD  aspirin 325 MG tablet Take 1 tablet by mouth daily.    [provider]  cefdinir (OMNICEF) 300 MG capsule Take 1 capsule (300 mg total) by mouth 2 (two) times daily. 06/30/19   Loletha Grayer, MD  [facility] GLYCERIN ADULT 2 g suppository SMARTSIG:1 SUPPOS Rectally As Needed  06/09/19   [provider]  dexamethasone (DECADRON) 4 MG tablet Take 1 tablet (4 mg total) by mouth daily. 06/24/19   Lloyd Huger, MD  esomeprazole (NEXIUM) 20 MG capsule Take 20 mg by mouth daily at 12 noon.    [provider]  feeding supplement, ENSURE ENLIVE, (ENSURE ENLIVE) LIQD Take 237 mLs by mouth 3 (three) times daily  between meals. 06/30/19   Loletha Grayer, MD  fluticasone (FLONASE) 50 MCG/ACT nasal spray Place 2 sprays into both nostrils daily as needed for allergies or rhinitis.    [provider]  Fluticasone-Umeclidin-Vilant (TRELEGY ELLIPTA) 100-62.5-25 MCG/INH AEPB Inhale 1 puff into the lungs daily.    [provider]  HYDROcodone-acetaminophen (NORCO/VICODIN) 5-325 MG tablet Take 1 tablet by mouth every 6 (six) hours as needed. 06/24/19   Lloyd Huger, MD  loratadine (ALLERGY RELIEF) 10 MG tablet Take 1 tablet (10 mg total) by mouth daily. 05/13/19   Ezekiel Slocumb, DO  megestrol (MEGACE) 40 MG tablet Take 40 mg by mouth daily. 05/02/19   [provider]  omega-3 acid ethyl esters (LOVAZA) 1 g capsule Take 1 g by mouth daily.    [provider]  polyethylene glycol powder (GLYCOLAX/MIRALAX) 17 GM/SCOOP powder Take 17 g by mouth 2 (two) times daily. Mix in 4-8 ounces of fluid prior to taking. 06/09/19   [provider]  senna-docusate (SENOKOT-S) 8.6-50 MG tablet Take 1 tablet by mouth at bedtime as needed for mild constipation. 06/19/19 07/19/19  Sidney Ace, MD  vitamin B-12 (CYANOCOBALAMIN) 500 MCG tablet Take 500 mcg by mouth daily.    [provider]  vitamin C (ASCORBIC ACID) 250 MG tablet Take 500 mg by mouth daily.     [provider]  vitamin E (VITAMIN E) 400 UNIT capsule Take 400 Units by mouth daily.    [provider]    Allergies Patient has no known allergies.  Family History  Problem Relation Age of Onset  . Emphysema Father   . Lung cancer Maternal Uncle     Social History Social History   Tobacco Use  . Smoking status: Former Smoker    Packs/day: 0.50    Years: 60.00    Pack years: 30.00    Types: Cigarettes    Quit date: 2015    Years since quitting: 6.1  . Smokeless tobacco: Never Used  Substance Use Topics  . Alcohol use: No  . Drug use: No      Review of  Systems Constitutional: No fever/chills Eyes: No visual changes. ENT: No sore throat. Cardiovascular: No chest pain Respiratory: Positive for SOB Gastrointestinal: No abdominal pain.  No nausea, no vomiting.  No diarrhea.  No constipation. Genitourinary: Negative for dysuria. Musculoskeletal: Negative for back pain. Skin: Negative for rash. Neurological: Negative for headaches, focal weakness or numbness. All other ROS negative ____________________________________________   PHYSICAL EXAM:  VITAL SIGNS: Blood pressure 139/76, pulse 80, temperature 97.7 F (36.5 C), temperature source Oral, resp. rate 20, height 5\' 8"  (1.727 m), weight 43 kg, SpO2 100 %.   Constitutional: Alert and oriented.  Cachectic. Eyes: Conjunctivae are normal. EOMI. Head: Atraumatic. Nose: No congestion/rhinnorhea. Mouth/Throat: Mucous membranes are moist.   Neck: No stridor. Trachea Midline. FROM Cardiovascular: Normal rate, regular rhythm. Grossly normal heart sounds.  Good peripheral circulation. Respiratory: No increased work of breathing, clear lungs Gastrointestinal: Soft and nontender. No distention. No abdominal bruits.  Musculoskeletal: No lower extremity tenderness nor edema.  No joint  effusions. Neurologic:  Normal speech and language. No gross focal neurologic deficits are appreciated.  Skin:  Skin is warm, dry and intact. No rash noted. Psychiatric: Mood and affect are normal. Speech and behavior are normal. GU: Deferred   ____________________________________________   LABS (all labs ordered are listed, but only abnormal results are displayed)  Labs Reviewed  CBC WITH DIFFERENTIAL/PLATELET - Abnormal; Notable for the following components:      Result Value   WBC 16.8 (*)    RBC 3.72 (*)    Hemoglobin 10.2 (*)    HCT 31.5 (*)    RDW 17.3 (*)    Platelets 655 (*)    Neutro Abs 15.5 (*)    Lymphs Abs 0.5 (*)    Abs Immature Granulocytes 0.14 (*)    All other components within  normal limits  COMPREHENSIVE METABOLIC PANEL - Abnormal; Notable for the following components:   CO2 21 (*)    Glucose, Bld 140 (*)    BUN 26 (*)    Albumin 3.2 (*)    GFR calc non Af Amer 59 (*)    All other components within normal limits  BRAIN NATRIURETIC PEPTIDE - Abnormal; Notable for the following components:   B Natriuretic Peptide 146.0 (*)    All other components within normal limits  LACTIC ACID, PLASMA - Abnormal; Notable for the following components:   Lactic Acid, Venous 3.1 (*)    All other components within normal limits  LACTIC ACID, PLASMA - Abnormal; Notable for the following components:   Lactic Acid, Venous 3.6 (*)    All other components within normal limits  URINALYSIS, ROUTINE W REFLEX MICROSCOPIC - Abnormal; Notable for the following components:   Color, Urine AMBER (*)    APPearance HAZY (*)    Specific Gravity, Urine 1.031 (*)    Protein, ur 30 (*)    All other components within normal limits  RESPIRATORY PANEL BY RT PCR (FLU A&B, COVID)  PROCALCITONIN  PROCALCITONIN  TROPONIN I (HIGH SENSITIVITY)  TROPONIN I (HIGH SENSITIVITY)   ____________________________________________   ED ECG REPORT I, Vanessa [place], the attending physician, personally viewed and interpreted this ECG.  EKG is normal sinus rate of 86, no ST elevation, no T wave inversions, QTC of 502 ____________________________________________  RADIOLOGY   Official radiology report(s): CT Head Wo Contrast  Result Date: 07/08/2019 CLINICAL DATA:  Lung cancer. Assessment for hemorrhage in the setting of possible need for anticoagulation. EXAM: CT HEAD WITHOUT CONTRAST TECHNIQUE: Contiguous axial images were obtained from the base of the skull through the vertex without intravenous contrast. COMPARISON:  None. FINDINGS: Brain: There is no mass, hemorrhage or extra-axial collection. There is generalized atrophy without lobar predilection. Hypodensity of the white matter is most commonly  associated with chronic microvascular disease. Vascular: No abnormal hyperdensity of the major intracranial arteries or dural venous sinuses. No intracranial atherosclerosis. Skull: The visualized skull base, calvarium and extracranial soft tissues are normal. Sinuses/Orbits: No fluid levels or advanced mucosal thickening of the visualized paranasal sinuses. No mastoid or middle ear effusion. The orbits are normal. IMPRESSION: 1. No hemorrhage or other acute abnormality. 2. Chronic ischemic microangiopathy and generalized volume loss. Electronically Signed   By: Ulyses Jarred M.D.   On: 07/08/2019 19:37   CT Angio Chest PE W and/or Wo Contrast  Result Date: 07/08/2019 CLINICAL DATA:  79 year old male with shortness of breath, chronic oxygen. Currently being evaluated for possible left lower lobe mass. EXAM: CT ANGIOGRAPHY CHEST WITH CONTRAST  TECHNIQUE: Multidetector CT imaging of the chest was performed using the standard protocol during bolus administration of intravenous contrast. Multiplanar CT image reconstructions and MIPs were obtained to evaluate the vascular anatomy. CONTRAST:  69mL OMNIPAQUE IOHEXOL 350 MG/ML SOLN COMPARISON:  CTA chest and CT Abdomen and Pelvis 06/17/2019. FINDINGS: Cardiovascular: Good contrast bolus timing in the pulmonary arterial tree. Pulmonary artery patency in the left lung appears stable since last month, with obliterated left lower lobe pulmonary arteries. No central or hilar pulmonary embolus. The right lung pulmonary arteries appear patent and within normal limits. No aortic contrast today. Calcified aortic atherosclerosis. The heart remain shifted over to the left. No cardiomegaly or pericardial effusion is evident. Mediastinum/Nodes: Increased pneumomediastinum since 06/17/2019, gas now tracking into the bilateral lower neck and also toward the core of the diaphragm. The left hilum is indistinct as before, but elsewhere no mediastinal or right hilar lymphadenopathy is  identified. Lungs/Pleura: The left lower lobe bronchus more abruptly terminates now on series 7, image 44 and there is no residual more distal left lower lobe airway pneumatization as there was last month. Complete masslike opacification of the left lower lobe otherwise appears stable. Superimposed bullous emphysema, left upper lobe and right lung base predominant scarring. An indeterminate superior segment right lower lobe 8-9 mm oval nodule is stable from last month, new since 03/05/2020. No pleural effusion or new right lung opacity. Upper Abdomen: Elevation of the left hemidiaphragm again suspected. The stomach and splenic flexure are visible on series 5, image 75. There is stool mixed with oral contrast or other dense material in the colon. Evidence of chronic calcific pancreatitis. Grossly negative visible noncontrast liver, kidneys and adrenal glands. Musculoskeletal: Osteopenia. No acute or suspicious osseous lesion identified. Review of the MIP images confirms the above findings. IMPRESSION: 1. Stable pulmonary arteries, obliterated in the left lower lung but with no evidence of acute PE. 2. Continued mass-like opacification of the left lower lung with worsening abrupt termination of the lower lobe bronchus again highly suspicious for tumor. There is superimposed chronic elevation of the left hemidiaphragm. There is increased pneumomediastinum, now with subcutaneous gas tracking into the lower neck. It appears a PET-CT was scheduled for 06/25/2019 but did not occur. 3. Underlying bullous Emphysema (ICD10-J43.9). Aortic Atherosclerosis (ICD10-I70.0). Electronically Signed   By: Genevie Ann M.D.   On: 07/08/2019 19:36    ____________________________________________   PROCEDURES  Procedure(s) performed (including Critical Care):  Procedures   ____________________________________________   INITIAL IMPRESSION / ASSESSMENT AND PLAN / ED COURSE   LAURIN MORGENSTERN was evaluated in Emergency Department  on 07/08/2019 for the symptoms described in the history of present illness. He was evaluated in the context of the global COVID-19 pandemic, which necessitated consideration that the patient might be at risk for infection with the SARS-CoV-2 virus that causes COVID-19. Institutional protocols and algorithms that pertain to the evaluation of patients at risk for COVID-19 are in a state of rapid change based on information released by regulatory bodies including the CDC and federal and state organizations. These policies and algorithms were followed during the patient's care in the ED.     Pt presents with SOB.  Patient's shortness of breath most likely secondary to lung cancer, recent complication pneumomediastinum, postobstructive pneumonia.  Given the above will get CT PE to evaluate for these as well as pulmonary embolism.  Also get CT head given the increased weakness to make sure there is no bleed or large metastatic cancer in case we  have to anticoagulate patient.  PNA-will get xray to evaluation Anemia-CBC to evaluate ACS- will get trops Arrhythmia-Will get EKG and keep on monitor.  COVID- will get testing per algorithm.  CT imaging shows increased pneumomediastinum but no evidence of PE.  Continues to have his mass.  Procalcitonin was negative so I have lower suspicion for pneumonia.  Patient is lactate is rising even with fluids.  Suspect this is from dehydration and failure to thrive from his presumed cancer.  Patient is hardly able to stand up out of bed and take a few steps even on his oxygen.  At this time I do not think his elevated lactate is secondary to sepsis.  We will hold off on antibiotics at this time given he is otherwise vital signs are stable.  However will need to discuss with the hospital team for admission due to his failure to thrive, rising lactate, increased weakness.  ____________________________________________   FINAL CLINICAL IMPRESSION(S) / ED DIAGNOSES   Final  diagnoses:  High serum lactate  Shortness of breath  Weakness     MEDICATIONS GIVEN DURING THIS VISIT:  Medications  lactated ringers bolus 1,000 mL (has no administration in time range)  lactated ringers bolus 1,000 mL (1,000 mLs Intravenous New Bag/Given 07/08/19 1943)  iohexol (OMNIPAQUE) 350 MG/ML injection 75 mL (60 mLs Intravenous Contrast Given 07/08/19 1901)     ED Discharge Orders    None       Note:  This document was prepared using Dragon voice recognition software and may include unintentional dictation errors.   Vanessa [place], MD 07/08/19 2051

## 2019-07-09 ENCOUNTER — Encounter: Admission: RE | Admit: 2019-07-09 | Payer: Medicare Other | Source: Ambulatory Visit

## 2019-07-09 ENCOUNTER — Other Ambulatory Visit: Payer: Self-pay

## 2019-07-09 DIAGNOSIS — Z515 Encounter for palliative care: Secondary | ICD-10-CM

## 2019-07-09 DIAGNOSIS — R531 Weakness: Secondary | ICD-10-CM

## 2019-07-09 LAB — COMPREHENSIVE METABOLIC PANEL
ALT: 27 U/L (ref 0–44)
AST: 15 U/L (ref 15–41)
Albumin: 2.6 g/dL — ABNORMAL LOW (ref 3.5–5.0)
Alkaline Phosphatase: 48 U/L (ref 38–126)
Anion gap: 6 (ref 5–15)
BUN: 18 mg/dL (ref 8–23)
CO2: 24 mmol/L (ref 22–32)
Calcium: 8.4 mg/dL — ABNORMAL LOW (ref 8.9–10.3)
Chloride: 107 mmol/L (ref 98–111)
Creatinine, Ser: 0.83 mg/dL (ref 0.61–1.24)
GFR calc Af Amer: >60 mL/min (ref >60)
GFR calc non Af Amer: >60 mL/min (ref >60)
Glucose, Bld: 111 mg/dL — ABNORMAL HIGH (ref 70–99)
Potassium: 4.2 mmol/L (ref 3.5–5.1)
Sodium: 137 mmol/L (ref 135–145)
Total Bilirubin: 1 mg/dL (ref 0.3–1.2)
Total Protein: 5.4 g/dL — ABNORMAL LOW (ref 6.5–8.1)

## 2019-07-09 LAB — CBC
HCT: 25.1 % — ABNORMAL LOW (ref 39.0–52.0)
Hemoglobin: 8.3 g/dL — ABNORMAL LOW (ref 13.0–17.0)
MCH: 27 pg (ref 26.0–34.0)
MCHC: 33.1 g/dL (ref 30.0–36.0)
MCV: 81.8 fL (ref 80.0–100.0)
Platelets: 513 10*3/uL — ABNORMAL HIGH (ref 150–400)
RBC: 3.07 MIL/uL — ABNORMAL LOW (ref 4.22–5.81)
RDW: 17.4 % — ABNORMAL HIGH (ref 11.5–15.5)
WBC: 21.8 10*3/uL — ABNORMAL HIGH (ref 4.0–10.5)
nRBC: 0 % (ref 0.0–0.2)

## 2019-07-09 LAB — PROCALCITONIN: Procalcitonin: <0.1 ng/mL

## 2019-07-09 MED ORDER — UMECLIDINIUM BROMIDE 62.5 MCG/INH IN AEPB
1.0000 | INHALATION_SPRAY | Freq: Every day | RESPIRATORY_TRACT | Status: DC
  Administered 2019-07-09 – 2019-07-16 (×8): 1 via RESPIRATORY_TRACT
  Filled 2019-07-09 (×2): qty 7

## 2019-07-09 MED ORDER — FLUTICASONE FUROATE-VILANTEROL 100-25 MCG/INH IN AEPB
1.0000 | INHALATION_SPRAY | Freq: Every day | RESPIRATORY_TRACT | Status: DC
  Administered 2019-07-09 – 2019-07-16 (×8): 1 via RESPIRATORY_TRACT
  Filled 2019-07-09: qty 28

## 2019-07-09 NOTE — Progress Notes (Signed)
Patient received from to ED 31 from main ED.

## 2019-07-09 NOTE — Consult Note (Signed)
[place]  Telephone:([phone] [phone]   Name: Corey Carr Date: 07/09/2019 MRN: [id number]  DOB: January 16, 1941  Patient Care Team: [name], MD as PCP - General (Family Medicine) Telford Nab, RN as Registered Nurse    REASON FOR CONSULTATION: Corey Carr is a 79 y.o. male with multiple medical problems including recently diagnosed squamous cell lung cancer and O2 dependent COPD (on 3 L, which he has required since hospitalization in February 2021).  Patient has multiple recent hospitalizations including 05/08/2020-05/12/2019 with COVID-19 pneumonia and failure to thrive with 41 pound weight loss over the past 6 months.  Patient was hospitalized again 06/18/2019-06/20/2019 with hypercalcemia and was found to have a lung mass with pneumomediastinum on CT scan.  Patient was seen by medical oncology and scheduled for outpatient work-up.  Unfortunately, he was hospitalized again 06/26/2019-06/30/2019 with SIRS from postobstructive pneumonia.  Patient underwent left upper lobe biopsy on 06/30/2019 with pathology confirming squamous cell carcinoma.  Patient is now readmitted 07/09/2019 with shortness of breath, weakness and failure to thrive.  Palliative care was consulted help address goals and manage ongoing symptoms.  SOCIAL HISTORY:     reports that he quit smoking about 6 years ago. His smoking use included cigarettes. He has a 30.00 pack-year smoking history. He has never used smokeless tobacco. He reports that he does not drink alcohol or use drugs.  Patient is married and lives at home with his wife.  He has a son who lives nearby.  Patient retired a year ago from [employer].  ADVANCE DIRECTIVES:  Not on file  CODE STATUS: DNR  PAST MEDICAL HISTORY: Past Medical History:  Diagnosis Date   Atrial flutter ([place])    CHADsVASc 3   Hypertension     PAST SURGICAL HISTORY:  Past Surgical History:    Procedure Laterality Date   KNEE SURGERY Right 1957   VIDEO BRONCHOSCOPY WITH ENDOBRONCHIAL NAVIGATION N/A 06/30/2019   Procedure: VIDEO BRONCHOSCOPY WITH ENDOBRONCHIAL NAVIGATION;  Surgeon: Ottie Glazier, MD;  Location: [facility] ORS;  Service: Thoracic;  Laterality: N/A;   VIDEO BRONCHOSCOPY WITH ENDOBRONCHIAL ULTRASOUND N/A 06/30/2019   Procedure: VIDEO BRONCHOSCOPY WITH ENDOBRONCHIAL ULTRASOUND;  Surgeon: Ottie Glazier, MD;  Location: [facility] ORS;  Service: Thoracic;  Laterality: N/A;    HEMATOLOGY/ONCOLOGY HISTORY:  Oncology History   No history exists.    ALLERGIES:  has No Known Allergies.  MEDICATIONS:  Current Facility-Administered Medications  Medication Dose Route Frequency Provider Last Rate Last Admin   acetaminophen (TYLENOL) tablet 650 mg  650 mg Oral Q6H PRN Elwyn Reach, MD       Or   acetaminophen (TYLENOL) suppository 650 mg  650 mg Rectal Q6H PRN Jonelle Sidle, Mohammad L, MD       albuterol (PROVENTIL) (2.5 MG/3ML) 0.083% nebulizer solution 3 mL  3 mL Inhalation See admin instructions Elwyn Reach, MD       amiodarone (PACERONE) tablet 200 mg  200 mg Oral BID Gala Romney L, MD   200 mg at 07/09/19 6681   ascorbic acid (VITAMIN C) tablet 500 mg  500 mg Oral Daily Gala Romney L, MD   500 mg at 07/09/19 5947   aspirin EC tablet 325 mg  325 mg Oral Daily Elwyn Reach, MD   325 mg at 07/09/19 0837   dexamethasone (DECADRON) tablet 4 mg  4 mg Oral Daily Elwyn Reach, MD   4 mg at 07/09/19 0835   dextrose  5 %-0.9 % sodium chloride infusion   Intravenous Continuous Elwyn Reach, MD 125 mL/hr at 07/09/19 0109 New Bag at 07/09/19 0109   feeding supplement (ENSURE ENLIVE) (ENSURE ENLIVE) liquid 237 mL  237 mL Oral TID BM Garba, Mohammad L, MD       fluticasone (FLONASE) 50 MCG/ACT nasal spray 2 spray  2 spray Each Nare Daily PRN Garba, Mohammad L, MD       fluticasone furoate-vilanterol (BREO ELLIPTA) 100-25 MCG/INH 1 puff  1 puff Inhalation Daily Hallaji,  Sheema M, RPH       And   umeclidinium bromide (INCRUSE ELLIPTA) 62.5 MCG/INH 1 puff  1 puff Inhalation Daily Hallaji, Sheema M, RPH       heparin injection 5,000 Units  5,000 Units Subcutaneous Q8H Elwyn Reach, MD   5,000 Units at 07/09/19 1558   HYDROcodone-acetaminophen (NORCO/VICODIN) 5-325 MG per tablet 1 tablet  1 tablet Oral Q6H PRN Elwyn Reach, MD       loratadine (CLARITIN) tablet 10 mg  10 mg Oral Daily Elwyn Reach, MD   10 mg at 07/09/19 7867   megestrol (MEGACE) tablet 40 mg  40 mg Oral Daily Elwyn Reach, MD   40 mg at 07/09/19 5449   omega-3 acid ethyl esters (LOVAZA) capsule 1 g  1 g Oral Daily Elwyn Reach, MD       ondansetron (ZOFRAN) tablet 4 mg  4 mg Oral Q6H PRN Elwyn Reach, MD       Or   ondansetron (ZOFRAN) injection 4 mg  4 mg Intravenous Q6H PRN Elwyn Reach, MD       pantoprazole (PROTONIX) EC tablet 40 mg  40 mg Oral Daily Gala Romney L, MD   40 mg at 07/09/19 0837   polyethylene glycol (MIRALAX / GLYCOLAX) packet 17 g  17 g Oral BID Gala Romney L, MD   17 g at 07/09/19 2010   senna-docusate (Senokot-S) tablet 1 tablet  1 tablet Oral QHS PRN Elwyn Reach, MD       vitamin B-12 (CYANOCOBALAMIN) tablet 500 mcg  500 mcg Oral Daily Elwyn Reach, MD   500 mcg at 07/09/19 0712   vitamin E capsule 400 Units  400 Units Oral Daily Elwyn Reach, MD   400 Units at 07/09/19 0835    VITAL SIGNS: BP 137/75 (BP Location: Right Arm)   Pulse 62   Temp 98 F (36.7 C) (Oral)   Resp 16   Ht '5\' 8"'  (1.727 m)   Wt 94 lb 12.8 oz (43 kg)   SpO2 94%   BMI 14.41 kg/m  Filed Weights   07/08/19 1742  Weight: 94 lb 12.8 oz (43 kg)    Estimated body mass index is 14.41 kg/m as calculated from the following:   Height as of this encounter: '5\' 8"'  (1.727 m).   Weight as of this encounter: 94 lb 12.8 oz (43 kg).  LABS: CBC:    Component Value Date/Time   WBC 21.8 (H) 07/09/2019 0629   HGB 8.3 (L) 07/09/2019 0629   HCT  25.1 (L) 07/09/2019 0629   PLT 513 (H) 07/09/2019 0629   MCV 81.8 07/09/2019 0629   NEUTROABS 15.5 (H) 07/08/2019 1811   LYMPHSABS 0.5 (L) 07/08/2019 1811   MONOABS 0.6 07/08/2019 1811   EOSABS 0.0 07/08/2019 1811   BASOSABS 0.0 07/08/2019 1811   Comprehensive Metabolic Panel:    Component Value Date/Time   NA 137 07/09/2019  0629   K 4.2 07/09/2019 0629   K 3.9 11/26/2012 1048   CL 107 07/09/2019 0629   CO2 24 07/09/2019 0629   BUN 18 07/09/2019 0629   CREATININE 0.83 07/09/2019 0629   GLUCOSE 111 (H) 07/09/2019 0629   CALCIUM 8.4 (L) 07/09/2019 0629   CALCIUM 12.3 (H) 05/24/2019 0745   AST 15 07/09/2019 0629   ALT 27 07/09/2019 0629   ALKPHOS 48 07/09/2019 0629   BILITOT 1.0 07/09/2019 0629   PROT 5.4 (L) 07/09/2019 0629   ALBUMIN 2.6 (L) 07/09/2019 0629    RADIOGRAPHIC STUDIES: DG Chest 2 View  Result Date: 06/17/2019 CLINICAL DATA:  Dizziness and shortness of breath. EXAM: CHEST - 2 VIEW COMPARISON:  05/23/2019. FINDINGS: Similar appearance of volume loss in the left hemithorax with elevation of the left hemidiaphragm and left base collapse/consolidative opacity. There is diffuse interstitial and pleural opacity in the left chest. Right lung is hyperexpanded without focal consolidation or pleural effusion. Bones are diffusely demineralized. IMPRESSION: Largely stable exam. Marked volume loss left hemithorax with posterior collapse/consolidative change in diffuse pleural thickening. Electronically Signed   By: Misty Stanley M.D.   On: 06/17/2019 16:43   CT Head Wo Contrast  Result Date: 07/08/2019 CLINICAL DATA:  Lung cancer. Assessment for hemorrhage in the setting of possible need for anticoagulation. EXAM: CT HEAD WITHOUT CONTRAST TECHNIQUE: Contiguous axial images were obtained from the base of the skull through the vertex without intravenous contrast. COMPARISON:  None. FINDINGS: Brain: There is no mass, hemorrhage or extra-axial collection. There is generalized atrophy  without lobar predilection. Hypodensity of the white matter is most commonly associated with chronic microvascular disease. Vascular: No abnormal hyperdensity of the major intracranial arteries or dural venous sinuses. No intracranial atherosclerosis. Skull: The visualized skull base, calvarium and extracranial soft tissues are normal. Sinuses/Orbits: No fluid levels or advanced mucosal thickening of the visualized paranasal sinuses. No mastoid or middle ear effusion. The orbits are normal. IMPRESSION: 1. No hemorrhage or other acute abnormality. 2. Chronic ischemic microangiopathy and generalized volume loss. Electronically Signed   By: Ulyses Jarred M.D.   On: 07/08/2019 19:37   CT Angio Chest PE W and/or Wo Contrast  Result Date: 07/08/2019 CLINICAL DATA:  79 year old male with shortness of breath, chronic oxygen. Currently being evaluated for possible left lower lobe mass. EXAM: CT ANGIOGRAPHY CHEST WITH CONTRAST TECHNIQUE: Multidetector CT imaging of the chest was performed using the standard protocol during bolus administration of intravenous contrast. Multiplanar CT image reconstructions and MIPs were obtained to evaluate the vascular anatomy. CONTRAST:  82m OMNIPAQUE IOHEXOL 350 MG/ML SOLN COMPARISON:  CTA chest and CT Abdomen and Pelvis 06/17/2019. FINDINGS: Cardiovascular: Good contrast bolus timing in the pulmonary arterial tree. Pulmonary artery patency in the left lung appears stable since last month, with obliterated left lower lobe pulmonary arteries. No central or hilar pulmonary embolus. The right lung pulmonary arteries appear patent and within normal limits. No aortic contrast today. Calcified aortic atherosclerosis. The heart remain shifted over to the left. No cardiomegaly or pericardial effusion is evident. Mediastinum/Nodes: Increased pneumomediastinum since 06/17/2019, gas now tracking into the bilateral lower neck and also toward the core of the diaphragm. The left hilum is indistinct  as before, but elsewhere no mediastinal or right hilar lymphadenopathy is identified. Lungs/Pleura: The left lower lobe bronchus more abruptly terminates now on series 7, image 44 and there is no residual more distal left lower lobe airway pneumatization as there was last month. Complete masslike opacification of  the left lower lobe otherwise appears stable. Superimposed bullous emphysema, left upper lobe and right lung base predominant scarring. An indeterminate superior segment right lower lobe 8-9 mm oval nodule is stable from last month, new since 03/05/2020. No pleural effusion or new right lung opacity. Upper Abdomen: Elevation of the left hemidiaphragm again suspected. The stomach and splenic flexure are visible on series 5, image 75. There is stool mixed with oral contrast or other dense material in the colon. Evidence of chronic calcific pancreatitis. Grossly negative visible noncontrast liver, kidneys and adrenal glands. Musculoskeletal: Osteopenia. No acute or suspicious osseous lesion identified. Review of the MIP images confirms the above findings. IMPRESSION: 1. Stable pulmonary arteries, obliterated in the left lower lung but with no evidence of acute PE. 2. Continued mass-like opacification of the left lower lung with worsening abrupt termination of the lower lobe bronchus again highly suspicious for tumor. There is superimposed chronic elevation of the left hemidiaphragm. There is increased pneumomediastinum, now with subcutaneous gas tracking into the lower neck. It appears a PET-CT was scheduled for 06/25/2019 but did not occur. 3. Underlying bullous Emphysema (ICD10-J43.9). Aortic Atherosclerosis (ICD10-I70.0). Electronically Signed   By: Genevie Ann M.D.   On: 07/08/2019 19:36   CT Angio Chest PE W/Cm &/Or Wo Cm  Result Date: 06/17/2019 CLINICAL DATA:  Shortness of breath EXAM: CT ANGIOGRAPHY CHEST WITH CONTRAST TECHNIQUE: Multidetector CT imaging of the chest was performed using the standard  protocol during bolus administration of intravenous contrast. Multiplanar CT image reconstructions and MIPs were obtained to evaluate the vascular anatomy. CONTRAST:  16m OMNIPAQUE IOHEXOL 350 MG/ML SOLN COMPARISON:  03/06/2019 FINDINGS: Cardiovascular: No filling defects in the pulmonary arteries to suggest pulmonary emboli. Heart is normal size. Aorta normal caliber. Aortic and coronary artery atherosclerosis. Mediastinum/Nodes: There is pneumomediastinum. Gas seen around the main pulmonary artery and in the posterior mediastinum. No adenopathy. Lungs/Pleura: Volume loss on the left with elevation of the left hemidiaphragm. Findings are chronic. There is a chronic masslike area of consolidation in the left lower lobe which is similar to prior study. Advanced centrilobular emphysema and areas of scarring in both lungs. Possible small left effusion, stable. Upper Abdomen: Imaging into the upper abdomen shows no acute findings. Musculoskeletal: Cachexia.  No acute bony abnormality. Review of the MIP images confirms the above findings. IMPRESSION: Pneumomediastinum seen in the middle and posterior mediastinum. No associated pneumothorax. Advanced centrilobular emphysema. Stable chronic volume loss on the left with elevation of the left hemidiaphragm and masslike consolidation in the left lower lobe. As recommended on prior study, bronchoscopy with attention to the left lower lobe and/or PET CT may be helpful to exclude left lower lobe mass lesion. Small right pleural effusion. Areas of scarring in the lungs bilaterally. Aortic Atherosclerosis (ICD10-I70.0) and Emphysema (ICD10-J43.9). Electronically Signed   By: KRolm BaptiseM.D.   On: 06/17/2019 21:12   CT Abdomen Pelvis W Contrast  Result Date: 06/17/2019 CLINICAL DATA:  Hypercalcemia. EXAM: CT ABDOMEN AND PELVIS WITH CONTRAST TECHNIQUE: Multidetector CT imaging of the abdomen and pelvis was performed using the standard protocol following bolus administration  of intravenous contrast. CONTRAST:  755mOMNIPAQUE IOHEXOL 350 MG/ML SOLN COMPARISON:  None. FINDINGS: Lower chest: Masslike opacity seen in the left lower lobe as described on prior chest CT. Trace left pleural effusion. Volume loss on the left with elevation of the left hemidiaphragm. Advanced emphysema. Scarring in the right lower lobe. Hepatobiliary: No focal hepatic abnormality. Gallbladder unremarkable. Pancreas: Calcifications throughout the pancreas compatible  with chronic pancreatitis. No evidence of acute pancreatitis, fall suspicious pancreatic lesion or pancreatic ductal dilatation. Spleen: No focal abnormality.  Normal size. Adrenals/Urinary Tract: Small cysts in the left kidney. No hydronephrosis. Adrenal glands and urinary bladder unremarkable. Stomach/Bowel: Large stool burden throughout the colon. Distention of the rectum with stool concerning for fecal impaction. Stomach and small bowel decompressed, unremarkable. Vascular/Lymphatic: Aortic atherosclerosis. No enlarged abdominal or pelvic lymph nodes. Reproductive: Prostate prominence. Other: No free fluid or free air. Musculoskeletal: Cachexia. No acute bony abnormality. Degenerative disc and facet disease in the lower lumbar spine. IMPRESSION: Masslike opacity in the left lower lobe as described on chest CT concerning for possible pulmonary mass lesions/malignancy. This could be further evaluated with bronchoscopy and/or PET CT as described on prior chest CT. Advanced emphysema. Large stool burden throughout the colon. Appearance is concerning for fecal impaction. Chronic pancreatitis changes.  No evidence of acute pancreatitis. Aortic atherosclerosis. Electronically Signed   By: Rolm Baptise M.D.   On: 06/17/2019 21:16   DG Chest Port 1 View  Result Date: 06/25/2019 CLINICAL DATA:  Weakness, COVID-19 positive EXAM: PORTABLE CHEST 1 VIEW COMPARISON:  06/17/2019 FINDINGS: Chronic volume loss with consolidation in the left hemithorax, stable  from prior. There may be mildly increased opacity within the aerated portion of the left lung. Heart size is stable. Mediastinal structures remain shifted to the left. There is hyperexpansion of the right lung field with chronic emphysematous changes and right apical scarring. No pneumothorax. IMPRESSION: Chronic volume loss with consolidation in the left hemithorax, stable from prior. There may be mildly increased opacity within the aerated portion of the left lung, which could reflect pneumonia given the patient's history. Electronically Signed   By: Davina Poke D.O.   On: 06/25/2019 13:31   DG C-Arm 1-60 Min-No Report  Result Date: 06/30/2019 Fluoroscopy was utilized by the requesting physician.  No radiographic interpretation.   ECHOCARDIOGRAM COMPLETE  Result Date: 06/26/2019   ECHOCARDIOGRAM REPORT   Patient Name:   Corey Carr Date of Exam: 06/26/2019 Medical Rec #:  [id number]       Height:       68.0 in Accession #:    [id number]      Weight:       92.2 lb Date of Birth:  07/11/1940       BSA:          1.47 m Patient Age:    75 years        BP:           89/66 mmHg Patient Gender: M               HR:           129 bpm. Exam Location:  [facility] Procedure: 2D Echo Indications:     ATRIAL FIBRILLATION 427.31/I48.91  History:         Patient has no prior history of Echocardiogram examinations.                  COPD; Risk Factors:Hypertension.  Sonographer:     Avanell Shackleton Referring Phys:  [place] Diagnosing Phys: Ida Rogue MD  Sonographer Comments: Image acquisition challenging due to patient body habitus. IMPRESSIONS  1. Left ventricular ejection fraction, by visual estimation, is 50 to 55%. The left ventricle has normal function. There is moderately increased left ventricular hypertrophy.  2. Left ventricular diastolic parameters are indeterminate.  3. The left ventricle has no regional wall motion abnormalities.  4. Global right ventricle has normal systolic function.The right  ventricular size is normal. No increase in right ventricular wall thickness.  5. Left atrial size was normal.  6. Tricuspid valve regurgitation is mild-moderate.  7. Mildly elevated pulmonary artery systolic pressure.  8. Tachycardia noted/atrial flutter, rate 130 bpm FINDINGS  Left Ventricle: Left ventricular ejection fraction, by visual estimation, is 50 to 55%. The left ventricle has normal function. The left ventricle has no regional wall motion abnormalities. There is moderately increased left ventricular hypertrophy. Left ventricular diastolic parameters are indeterminate. Normal left atrial pressure. Right Ventricle: The right ventricular size is normal. No increase in right ventricular wall thickness. Global RV systolic function is has normal systolic function. The tricuspid regurgitant velocity is 2.90 m/s, and with an assumed right atrial pressure  of 5 mmHg, the estimated right ventricular systolic pressure is mildly elevated at 38.6 mmHg. Left Atrium: Left atrial size was normal in size. Right Atrium: Right atrial size was normal in size Pericardium: There is no evidence of pericardial effusion. Mitral Valve: The mitral valve is normal in structure. Mild to moderate mitral valve regurgitation. No evidence of mitral valve stenosis by observation. Tricuspid Valve: The tricuspid valve is normal in structure. Tricuspid valve regurgitation is mild-moderate. Aortic Valve: The aortic valve is normal in structure. Aortic valve regurgitation is not visualized. Mild to moderate aortic valve sclerosis/calcification is present, without any evidence of aortic stenosis. Pulmonic Valve: The pulmonic valve was normal in structure. Pulmonic valve regurgitation is not visualized. Pulmonic regurgitation is not visualized. Aorta: The aortic root, ascending aorta and aortic arch are all structurally normal, with no evidence of dilitation or obstruction. Venous: The inferior vena cava is normal in size with greater than 50%  respiratory variability, suggesting right atrial pressure of 3 mmHg. IAS/Shunts: No atrial level shunt detected by color flow Doppler. There is no evidence of a patent foramen ovale. No ventricular septal defect is seen or detected. There is no evidence of an atrial septal defect.  LEFT VENTRICLE PLAX 2D LVIDd:         2.60 cm LVIDs:         2.26 cm LV PW:         0.68 cm LV IVS:        0.92 cm LVOT diam:     1.70 cm LV SV:         7 ml LV SV Index:   5.30 LVOT Area:     2.27 cm  LEFT ATRIUM             Index       RIGHT ATRIUM           Index LA diam:        2.70 cm 1.84 cm/m  RA Area:     16.00 cm LA Vol (A2C):   58.4 ml 39.72 ml/m RA Volume:   45.70 ml  31.08 ml/m LA Vol (A4C):   33.6 ml 22.85 ml/m LA Biplane Vol: 48.1 ml 32.71 ml/m   AORTA Ao Root diam: 3.30 cm TRICUSPID VALVE TR Peak grad:   33.6 mmHg TR Vmax:        305.00 cm/s  SHUNTS Systemic Diam: 1.70 cm  Ida Rogue MD Electronically signed by Ida Rogue MD Signature Date/Time: 06/26/2019/7:28:13 PM    Final    [place] EKG SITE RITE  Result Date: 06/26/2019 If Site Rite image not attached, placement could not be confirmed due to current cardiac rhythm.   PERFORMANCE  STATUS (ECOG) : 3 - Symptomatic, >50% confined to bed  Review of Systems Unless otherwise noted, a complete review of systems is negative.  Physical Exam General: NAD, frail appearing, cachectic Pulmonary: Unlabored  Extremities: no edema, no joint deformities Skin: no rashes Neurological: Weakness but otherwise nonfocal  IMPRESSION: I met with patient to discuss goals.  Introduced palliative care services and attempted to establish therapeutic rapport.  Patient seemed to have limited understanding of his current health problems.  He said "I think someone might of mentioned that I have cancer."  We discussed the possible limitations for treatment given his profound weakness and overall decline.  Patient verbalized desire to continue the current scope of treatment.   We will continue to engage with him in conversations regarding goals.  Patient says at baseline, he lives at home with his wife.  He reports previously being functionally independent but admits to recent decline with weakness and shortness of breath.  Patient currently denies any distressing symptoms.  He says that he has eaten more in the hospital than he has recently at home.  He attempted to work with PT but was limited due to exertional hypoxia.  Patient does not have an established ACP.  He says that he would want his wife to participate in decision-making if needed.  However, he says that her health is quite poor as she is on hemodialysis.  He has a son but patient says that he is not actively involved.  PLAN: -Continue current scope of treatment -Agree with DNR -Will follow    Time Total: 30 minutes  Visit consisted of counseling and education dealing with the complex and emotionally intense issues of symptom management and palliative care in the setting of serious and potentially life-threatening illness.Greater than 50%  of this time was spent counseling and coordinating care related to the above assessment and plan.  Signed by: Altha Harm, PhD, NP-C

## 2019-07-09 NOTE — Progress Notes (Signed)
MD notified as the patient's oxygen level desats in the 60-70% with exertion. The patient has been placed on telemetry and continuous pulse ox monitoring.

## 2019-07-09 NOTE — Progress Notes (Signed)
Patient awake, denies any pain. Repositioned in bed.

## 2019-07-09 NOTE — ED Notes (Signed)
Pt resting comfortably at this time, call bell within reach, stretcher locked in lowest position.

## 2019-07-09 NOTE — Progress Notes (Signed)
Scheduled medications given to patient. Patient c/o sore bottom, offered to place patient in hospital bed and patient refused at this time. Repositioned patient on stretcher.

## 2019-07-09 NOTE — Progress Notes (Signed)
[facility] Progress Note  Patient: Corey Carr    FAO:[id number]  DOA: 07/08/2019     Date of Service: the patient was seen and examined on 07/09/2019  Chief Complaint  Patient presents with  . Shortness of Breath   Brief hospital course: Corey Carr is a 79 y.o. male with medical history significant of lung cancer, atrial flutter, paroxysmal A. fib, chronic respiratory failure on 3 L of oxygen, remote history of COVID-19 pneumonia back in December, prior history of hypoglycemia hypertension among other things who came into the ER today with significant shortness of breath from home.  He has felt progressively more short of breath lately.  Mostly worsened with exertion.  Denied any sick contact.  Denied any fever or chills but he has had cough.  Patient was recently diagnosed with lung cancer and had a PET scan.  He has COPD but does not feel like he is wheezing.  He did have pneumomediastinum the last time he was in the hospital.  He appears to current have pneumomediastinum which has slightly worsened.  He had bronchoscopy at the time with biopsy.  Patient is generally weak and debilitated.  He has failed to thrive essentially not eating or drinking adequately.  At this point he is being admitted to the hospital for evaluation.  He has been staying home instead of skilled facility with his wife.  But he appears to be failing to thrive at home.  ED Course: Temperature 97.7 blood pressure 142/73 pulse 88 respirate 22 oxygen sat 99% on 3 L.  White count is 16.8, hemoglobin 10.2 and platelets 655.  Sodium 136 potassium 4.2 chloride 101 CO2 21 BUN 26 creatinine 1.18 calcium 9.2 and procalcitonin 0.1.  Lactic acid 3.6.  COVID-19 testing is negative.  Urinalysis essentially negative.  CT angiogram of the chest showed stable pulmonary arteries with continuous masslike opacification of the left lower lung with worsening chronic elevation of the left hemidiaphragm.  Also increase pneumomediastinum  with subcutaneous gas tracking into the lower neck.  Head CT without contrast showed no hemorrhage or chronic ischemic microangiopathy only patient being admitted to the hospital to evaluate and treat his weakness.   Currently further plan is continuation of the nasal cannula until patient's oxygenation improves. Awaiting for pulmonology consult Thoracic surgery not available today  Assessment and Plan:  Principal Problem:   Generalized weakness Active Problems:   Failure to thrive (0-17)   Squamous cell carcinoma of left lung (HCC)   Essential hypertension   Dehydration   Chronic obstructive pulmonary disease (HCC)   AF (paroxysmal atrial fibrillation) (HCC)    #Acute on chronic hypoxic respiratory failure, presented with shortness of breath: Probably secondary to patient's lung pathology with the pneumomediastinum.  This seems to be slightly worsening.   Continue on oxygen and home regimen including inhalers.   No evidence of pneumonia or PE.   Pulmonary consulted, will follow for further management  # generalized weakness: Multifactorial.  Patient is slowly failing to thrive at home.  We will admit the patient for evaluation and supportive care.  Consider social worker consult with PT and OT evaluation.  Follow recommendations for disposition.  # dehydration: Aggressively hydrate patient and monitor  # essential hypertension: Continue blood pressure control using home regimen.  # advanced COPD: On home O2.  Continue  # squamous cell carcinoma of the left lung: Continue treatment by oncology.  # paroxysmal atrial fibrillation: Currently in sinus rhythm.  Continue monitoring   Body  mass index is 14.41 kg/m.  Interventions: Started oral supplement   Pressure Injury 06/26/19 Coccyx Medial Stage 2 -  Partial thickness loss of dermis presenting as a shallow open injury with a red, pink wound bed without slough. pink open lesions (Active)  06/26/19 0057  Location:  Coccyx  Location Orientation: Medial  Staging: Stage 2 -  Partial thickness loss of dermis presenting as a shallow open injury with a red, pink wound bed without slough.  Wound Description (Comments): pink open lesions  Present on Admission: Yes     Diet: Regular diet DVT Prophylaxis: Subcutaneous Heparin    Advance goals of care discussion: DNR  Family Communication: No one fromfamily was present at bedside, at the time of interview.  The pt provided permission to discuss medical plan with the family. Opportunity was given to ask question and all questions were answered satisfactorily.   Disposition:  Pt is from home, admitted with pneumomediastinum, acute on chronic hypoxic respiratory failure, still has shortness of breath and acute on chronic hypoxic respiratory failure, dyspnea on exertion, which precludes a safe discharge. Discharge to Home, when respiratory failure will stabilize and cleared by pulmonologist..  Subjective:  Patient was admitted overnight due to worsening of shortness of breath.  Patient feels a slight improvement but he still has dyspnea on exertion, feels weak With any chest pain or palpitations, no abdominal pain no nausea vomiting or diarrhea.  Physical Exam: General:  alert oriented to time, place, and person.  Appear in mild distress, affect appropriate Eyes: PERRLA ENT: Oral Mucosa Clear, moist  Neck: no JVD,  Cardiovascular: S1 and S2 Present, no Murmur,  Respiratory: good respiratory effort, Bilateral Air entry equal and Decreased, no Crackles, no wheezes Abdomen: Bowel Sound present, Soft and no tenderness,  Skin: no rashes Extremities: no Pedal edema, no calf tenderness Neurologic: without any new focal findings Gait not checked due to patient safety concerns  Vitals:   07/09/19 1536 07/09/19 1600 07/09/19 1601 07/09/19 1605  BP: 137/75     Pulse: 62     Resp: 16     Temp: 98 F (36.7 C)     TempSrc: Oral     SpO2: 94% (!) 66% 93% 93%    Weight:      Height:        Intake/Output Summary (Last 24 hours) at 07/09/2019 1827 Last data filed at 07/09/2019 1644 Gross per 24 hour  Intake --  Output 1250 ml  Net -1250 ml   Filed Weights   07/08/19 1742  Weight: 43 kg    Data Reviewed: I have personally reviewed and interpreted daily labs, tele strips, imagings as discussed above. I reviewed all nursing notes, pharmacy notes, vitals, pertinent old records I have discussed plan of care as described above with RN and patient/family.  CBC: Recent Labs  Lab 07/08/19 1811 07/09/19 0629  WBC 16.8* 21.8*  NEUTROABS 15.5*  --   HGB 10.2* 8.3*  HCT 31.5* 25.1*  MCV 84.7 81.8  PLT 655* 277*   Basic Metabolic Panel: Recent Labs  Lab 07/08/19 1811 07/09/19 0629  NA 136 137  K 4.4 4.2  CL 101 107  CO2 21* 24  GLUCOSE 140* 111*  BUN 26* 18  CREATININE 1.18 0.83  CALCIUM 9.2 8.4*    Studies: CT Head Wo Contrast  Result Date: 07/08/2019 CLINICAL DATA:  Lung cancer. Assessment for hemorrhage in the setting of possible need for anticoagulation. EXAM: CT HEAD WITHOUT CONTRAST TECHNIQUE: Contiguous axial images  were obtained from the base of the skull through the vertex without intravenous contrast. COMPARISON:  None. FINDINGS: Brain: There is no mass, hemorrhage or extra-axial collection. There is generalized atrophy without lobar predilection. Hypodensity of the white matter is most commonly associated with chronic microvascular disease. Vascular: No abnormal hyperdensity of the major intracranial arteries or dural venous sinuses. No intracranial atherosclerosis. Skull: The visualized skull base, calvarium and extracranial soft tissues are normal. Sinuses/Orbits: No fluid levels or advanced mucosal thickening of the visualized paranasal sinuses. No mastoid or middle ear effusion. The orbits are normal. IMPRESSION: 1. No hemorrhage or other acute abnormality. 2. Chronic ischemic microangiopathy and generalized volume loss.  Electronically Signed   By: Ulyses Jarred M.D.   On: 07/08/2019 19:37   CT Angio Chest PE W and/or Wo Contrast  Result Date: 07/08/2019 CLINICAL DATA:  79 year old male with shortness of breath, chronic oxygen. Currently being evaluated for possible left lower lobe mass. EXAM: CT ANGIOGRAPHY CHEST WITH CONTRAST TECHNIQUE: Multidetector CT imaging of the chest was performed using the standard protocol during bolus administration of intravenous contrast. Multiplanar CT image reconstructions and MIPs were obtained to evaluate the vascular anatomy. CONTRAST:  25mL OMNIPAQUE IOHEXOL 350 MG/ML SOLN COMPARISON:  CTA chest and CT Abdomen and Pelvis 06/17/2019. FINDINGS: Cardiovascular: Good contrast bolus timing in the pulmonary arterial tree. Pulmonary artery patency in the left lung appears stable since last month, with obliterated left lower lobe pulmonary arteries. No central or hilar pulmonary embolus. The right lung pulmonary arteries appear patent and within normal limits. No aortic contrast today. Calcified aortic atherosclerosis. The heart remain shifted over to the left. No cardiomegaly or pericardial effusion is evident. Mediastinum/Nodes: Increased pneumomediastinum since 06/17/2019, gas now tracking into the bilateral lower neck and also toward the core of the diaphragm. The left hilum is indistinct as before, but elsewhere no mediastinal or right hilar lymphadenopathy is identified. Lungs/Pleura: The left lower lobe bronchus more abruptly terminates now on series 7, image 44 and there is no residual more distal left lower lobe airway pneumatization as there was last month. Complete masslike opacification of the left lower lobe otherwise appears stable. Superimposed bullous emphysema, left upper lobe and right lung base predominant scarring. An indeterminate superior segment right lower lobe 8-9 mm oval nodule is stable from last month, new since 03/05/2020. No pleural effusion or new right lung opacity.  Upper Abdomen: Elevation of the left hemidiaphragm again suspected. The stomach and splenic flexure are visible on series 5, image 75. There is stool mixed with oral contrast or other dense material in the colon. Evidence of chronic calcific pancreatitis. Grossly negative visible noncontrast liver, kidneys and adrenal glands. Musculoskeletal: Osteopenia. No acute or suspicious osseous lesion identified. Review of the MIP images confirms the above findings. IMPRESSION: 1. Stable pulmonary arteries, obliterated in the left lower lung but with no evidence of acute PE. 2. Continued mass-like opacification of the left lower lung with worsening abrupt termination of the lower lobe bronchus again highly suspicious for tumor. There is superimposed chronic elevation of the left hemidiaphragm. There is increased pneumomediastinum, now with subcutaneous gas tracking into the lower neck. It appears a PET-CT was scheduled for 06/25/2019 but did not occur. 3. Underlying bullous Emphysema (ICD10-J43.9). Aortic Atherosclerosis (ICD10-I70.0). Electronically Signed   By: Genevie Ann M.D.   On: 07/08/2019 19:36    Scheduled Meds: . albuterol  3 mL Inhalation See admin instructions  . amiodarone  200 mg Oral BID  . vitamin C  500 mg Oral Daily  . aspirin EC  325 mg Oral Daily  . dexamethasone  4 mg Oral Daily  . feeding supplement (ENSURE ENLIVE)  237 mL Oral TID BM  . fluticasone furoate-vilanterol  1 puff Inhalation Daily   And  . umeclidinium bromide  1 puff Inhalation Daily  . heparin  5,000 Units Subcutaneous Q8H  . loratadine  10 mg Oral Daily  . megestrol  40 mg Oral Daily  . omega-3 acid ethyl esters  1 g Oral Daily  . pantoprazole  40 mg Oral Daily  . polyethylene glycol  17 g Oral BID  . vitamin B-12  500 mcg Oral Daily  . vitamin E  400 Units Oral Daily   Continuous Infusions: . dextrose 5 % and 0.9% NaCl 125 mL/hr at 07/09/19 1717   PRN Meds: acetaminophen **OR** acetaminophen, fluticasone,  HYDROcodone-acetaminophen, ondansetron **OR** ondansetron (ZOFRAN) IV, senna-docusate  Time spent: 35 minutes  Author: Val Riles. MD [facility] 07/09/2019 6:27 PM  To reach On-call, see care teams to locate the attending and reach out to them via [email or web address]. If 7PM-7AM, please contact night-coverage If you still have difficulty reaching the attending provider, please page the [facility] (Director on Call) for [facility] on amion for assistance.

## 2019-07-09 NOTE — Consult Note (Signed)
[place]  [phone] [phone]  ID: Corey Carr OB: 1940/07/05  MR#: [id number]  GYJ#:[id number]  Patient Care Team: [name], MD as PCP - General (Family Medicine) Telford Nab, RN as Registered Nurse  CHIEF COMPLAINT: Failure to thrive, squamous cell carcinoma of the lung.  INTERVAL HISTORY: Patient is a 79 year old male recently diagnosed with squamous cell carcinoma lung who has had multiple hospital admissions and declining performance status.  He presented to the emergency room today with worsening shortness of breath and failure to thrive.  Pneumomediastinum appears to be slightly worse on imaging.  He has no neurologic complaints.  He denies any recent fevers or illnesses.  He has a fair appetite and continues to lose weight.  He denies any chest pain, cough, or hemoptysis.  He denies any nausea, vomiting, constipation, or diarrhea.  He has no urinary complaints.  Patient offers no further specific complaints today.  REVIEW OF SYSTEMS:   Review of Systems  Constitutional: Positive for malaise/fatigue and weight loss. Negative for fever.  Respiratory: Positive for shortness of breath. Negative for cough and hemoptysis.   Cardiovascular: Negative.  Negative for chest pain and leg swelling.  Gastrointestinal: Negative.  Negative for abdominal pain.  Genitourinary: Negative.  Negative for dysuria.  Musculoskeletal: Negative.  Negative for back pain.  Skin: Negative.  Negative for rash.  Neurological: Positive for weakness. Negative for dizziness, focal weakness and headaches.  Psychiatric/Behavioral: The patient is not nervous/anxious.     As per HPI. Otherwise, a complete review of systems is negative.  PAST MEDICAL HISTORY: Past Medical History:  Diagnosis Date   Atrial flutter ([place])    CHADsVASc 3   Hypertension     PAST SURGICAL HISTORY: Past Surgical History:  Procedure Laterality Date   KNEE SURGERY Right  1957   VIDEO BRONCHOSCOPY WITH ENDOBRONCHIAL NAVIGATION N/A 06/30/2019   Procedure: VIDEO BRONCHOSCOPY WITH ENDOBRONCHIAL NAVIGATION;  Surgeon: Ottie Glazier, MD;  Location: [facility] ORS;  Service: Thoracic;  Laterality: N/A;   VIDEO BRONCHOSCOPY WITH ENDOBRONCHIAL ULTRASOUND N/A 06/30/2019   Procedure: VIDEO BRONCHOSCOPY WITH ENDOBRONCHIAL ULTRASOUND;  Surgeon: Ottie Glazier, MD;  Location: [facility] ORS;  Service: Thoracic;  Laterality: N/A;    FAMILY HISTORY: Family History  Problem Relation Age of Onset   Emphysema Father    Lung cancer Maternal Uncle     ADVANCED DIRECTIVES (Y/N):  @ADVDIR @  HEALTH MAINTENANCE: Social History   Tobacco Use   Smoking status: Former Smoker    Packs/day: 0.50    Years: 60.00    Pack years: 30.00    Types: Cigarettes    Quit date: 2015    Years since quitting: 6.1   Smokeless tobacco: Never Used  Substance Use Topics   Alcohol use: No   Drug use: No     Colonoscopy:  PAP:  Bone density:  Lipid panel:  No Known Allergies  Current Facility-Administered Medications  Medication Dose Route Frequency Provider Last Rate Last Admin   acetaminophen (TYLENOL) tablet 650 mg  650 mg Oral Q6H PRN Elwyn Reach, MD       Or   acetaminophen (TYLENOL) suppository 650 mg  650 mg Rectal Q6H PRN Garba, Mohammad L, MD       albuterol (PROVENTIL) (2.5 MG/3ML) 0.083% nebulizer solution 3 mL  3 mL Inhalation See admin instructions Elwyn Reach, MD       amiodarone (PACERONE) tablet 200 mg  200 mg Oral BID Elwyn Reach, MD   200 mg  at 07/09/19 9735   ascorbic acid (VITAMIN C) tablet 500 mg  500 mg Oral Daily Elwyn Reach, MD   500 mg at 07/09/19 3299   aspirin EC tablet 325 mg  325 mg Oral Daily Elwyn Reach, MD   325 mg at 07/09/19 0837   dexamethasone (DECADRON) tablet 4 mg  4 mg Oral Daily Elwyn Reach, MD   4 mg at 07/09/19 0835   dextrose 5 %-0.9 % sodium chloride infusion   Intravenous Continuous Elwyn Reach,  MD 125 mL/hr at 07/09/19 1717 New Bag at 07/09/19 1717   feeding supplement (ENSURE ENLIVE) (ENSURE ENLIVE) liquid 237 mL  237 mL Oral TID BM Garba, Mohammad L, MD       fluticasone (FLONASE) 50 MCG/ACT nasal spray 2 spray  2 spray Each Nare Daily PRN Garba, Mohammad L, MD       fluticasone furoate-vilanterol (BREO ELLIPTA) 100-25 MCG/INH 1 puff  1 puff Inhalation Daily Hallaji, Sheema M, RPH       And   umeclidinium bromide (INCRUSE ELLIPTA) 62.5 MCG/INH 1 puff  1 puff Inhalation Daily Hallaji, Sheema M, RPH       heparin injection 5,000 Units  5,000 Units Subcutaneous Q8H Gala Romney L, MD   5,000 Units at 07/09/19 1558   HYDROcodone-acetaminophen (NORCO/VICODIN) 5-325 MG per tablet 1 tablet  1 tablet Oral Q6H PRN Elwyn Reach, MD       loratadine (CLARITIN) tablet 10 mg  10 mg Oral Daily Gala Romney L, MD   10 mg at 07/09/19 2426   megestrol (MEGACE) tablet 40 mg  40 mg Oral Daily Gala Romney L, MD   40 mg at 07/09/19 8341   omega-3 acid ethyl esters (LOVAZA) capsule 1 g  1 g Oral Daily Garba, Mohammad L, MD       ondansetron (ZOFRAN) tablet 4 mg  4 mg Oral Q6H PRN Elwyn Reach, MD       Or   ondansetron (ZOFRAN) injection 4 mg  4 mg Intravenous Q6H PRN Elwyn Reach, MD       pantoprazole (PROTONIX) EC tablet 40 mg  40 mg Oral Daily Gala Romney L, MD   40 mg at 07/09/19 0837   polyethylene glycol (MIRALAX / GLYCOLAX) packet 17 g  17 g Oral BID Gala Romney L, MD   17 g at 07/09/19 0837   senna-docusate (Senokot-S) tablet 1 tablet  1 tablet Oral QHS PRN Elwyn Reach, MD       vitamin B-12 (CYANOCOBALAMIN) tablet 500 mcg  500 mcg Oral Daily Gala Romney L, MD   500 mcg at 07/09/19 9622   vitamin E capsule 400 Units  400 Units Oral Daily Elwyn Reach, MD   400 Units at 07/09/19 0835    OBJECTIVE: Vitals:   07/09/19 1605 07/09/19 2023  BP:  122/76  Pulse:  70  Resp:  20  Temp:  98.7 F (37.1 C)  SpO2: 93% 100%     Body mass  index is 14.41 kg/m.    ECOG FS:3 - Symptomatic, >50% confined to bed  General: Cachectic, no acute distress. Eyes: Pink conjunctiva, anicteric sclera. HEENT: Normocephalic, moist mucous membranes. Lungs: No audible wheezing or coughing. Heart: Regular rate and rhythm. Abdomen: Soft, nontender, no obvious distention. Musculoskeletal: No edema, cyanosis, or clubbing. Neuro: Alert, answering all questions appropriately. Cranial nerves grossly intact. Skin: No rashes or petechiae noted. Psych: Normal affect.  LAB RESULTS:  Lab Results  Component Value Date   NA 137 07/09/2019   K 4.2 07/09/2019   CL 107 07/09/2019   CO2 24 07/09/2019   GLUCOSE 111 (H) 07/09/2019   BUN 18 07/09/2019   CREATININE 0.83 07/09/2019   CALCIUM 8.4 (L) 07/09/2019   PROT 5.4 (L) 07/09/2019   ALBUMIN 2.6 (L) 07/09/2019   AST 15 07/09/2019   ALT 27 07/09/2019   ALKPHOS 48 07/09/2019   BILITOT 1.0 07/09/2019   GFRNONAA >60 07/09/2019   GFRAA >60 07/09/2019    Lab Results  Component Value Date   WBC 21.8 (H) 07/09/2019   NEUTROABS 15.5 (H) 07/08/2019   HGB 8.3 (L) 07/09/2019   HCT 25.1 (L) 07/09/2019   MCV 81.8 07/09/2019   PLT 513 (H) 07/09/2019     STUDIES: DG Chest 2 View  Result Date: 06/17/2019 CLINICAL DATA:  Dizziness and shortness of breath. EXAM: CHEST - 2 VIEW COMPARISON:  05/23/2019. FINDINGS: Similar appearance of volume loss in the left hemithorax with elevation of the left hemidiaphragm and left base collapse/consolidative opacity. There is diffuse interstitial and pleural opacity in the left chest. Right lung is hyperexpanded without focal consolidation or pleural effusion. Bones are diffusely demineralized. IMPRESSION: Largely stable exam. Marked volume loss left hemithorax with posterior collapse/consolidative change in diffuse pleural thickening. Electronically Signed   By: Misty Stanley M.D.   On: 06/17/2019 16:43   CT Head Wo Contrast  Result Date: 07/08/2019 CLINICAL DATA:   Lung cancer. Assessment for hemorrhage in the setting of possible need for anticoagulation. EXAM: CT HEAD WITHOUT CONTRAST TECHNIQUE: Contiguous axial images were obtained from the base of the skull through the vertex without intravenous contrast. COMPARISON:  None. FINDINGS: Brain: There is no mass, hemorrhage or extra-axial collection. There is generalized atrophy without lobar predilection. Hypodensity of the white matter is most commonly associated with chronic microvascular disease. Vascular: No abnormal hyperdensity of the major intracranial arteries or dural venous sinuses. No intracranial atherosclerosis. Skull: The visualized skull base, calvarium and extracranial soft tissues are normal. Sinuses/Orbits: No fluid levels or advanced mucosal thickening of the visualized paranasal sinuses. No mastoid or middle ear effusion. The orbits are normal. IMPRESSION: 1. No hemorrhage or other acute abnormality. 2. Chronic ischemic microangiopathy and generalized volume loss. Electronically Signed   By: Ulyses Jarred M.D.   On: 07/08/2019 19:37   CT Angio Chest PE W and/or Wo Contrast  Result Date: 07/08/2019 CLINICAL DATA:  79 year old male with shortness of breath, chronic oxygen. Currently being evaluated for possible left lower lobe mass. EXAM: CT ANGIOGRAPHY CHEST WITH CONTRAST TECHNIQUE: Multidetector CT imaging of the chest was performed using the standard protocol during bolus administration of intravenous contrast. Multiplanar CT image reconstructions and MIPs were obtained to evaluate the vascular anatomy. CONTRAST:  4mL OMNIPAQUE IOHEXOL 350 MG/ML SOLN COMPARISON:  CTA chest and CT Abdomen and Pelvis 06/17/2019. FINDINGS: Cardiovascular: Good contrast bolus timing in the pulmonary arterial tree. Pulmonary artery patency in the left lung appears stable since last month, with obliterated left lower lobe pulmonary arteries. No central or hilar pulmonary embolus. The right lung pulmonary arteries appear  patent and within normal limits. No aortic contrast today. Calcified aortic atherosclerosis. The heart remain shifted over to the left. No cardiomegaly or pericardial effusion is evident. Mediastinum/Nodes: Increased pneumomediastinum since 06/17/2019, gas now tracking into the bilateral lower neck and also toward the core of the diaphragm. The left hilum is indistinct as before, but elsewhere no mediastinal or right hilar lymphadenopathy is identified. Lungs/Pleura:  The left lower lobe bronchus more abruptly terminates now on series 7, image 44 and there is no residual more distal left lower lobe airway pneumatization as there was last month. Complete masslike opacification of the left lower lobe otherwise appears stable. Superimposed bullous emphysema, left upper lobe and right lung base predominant scarring. An indeterminate superior segment right lower lobe 8-9 mm oval nodule is stable from last month, new since 03/05/2020. No pleural effusion or new right lung opacity. Upper Abdomen: Elevation of the left hemidiaphragm again suspected. The stomach and splenic flexure are visible on series 5, image 75. There is stool mixed with oral contrast or other dense material in the colon. Evidence of chronic calcific pancreatitis. Grossly negative visible noncontrast liver, kidneys and adrenal glands. Musculoskeletal: Osteopenia. No acute or suspicious osseous lesion identified. Review of the MIP images confirms the above findings. IMPRESSION: 1. Stable pulmonary arteries, obliterated in the left lower lung but with no evidence of acute PE. 2. Continued mass-like opacification of the left lower lung with worsening abrupt termination of the lower lobe bronchus again highly suspicious for tumor. There is superimposed chronic elevation of the left hemidiaphragm. There is increased pneumomediastinum, now with subcutaneous gas tracking into the lower neck. It appears a PET-CT was scheduled for 06/25/2019 but did not occur. 3.  Underlying bullous Emphysema (ICD10-J43.9). Aortic Atherosclerosis (ICD10-I70.0). Electronically Signed   By: Genevie Ann M.D.   On: 07/08/2019 19:36   CT Angio Chest PE W/Cm &/Or Wo Cm  Result Date: 06/17/2019 CLINICAL DATA:  Shortness of breath EXAM: CT ANGIOGRAPHY CHEST WITH CONTRAST TECHNIQUE: Multidetector CT imaging of the chest was performed using the standard protocol during bolus administration of intravenous contrast. Multiplanar CT image reconstructions and MIPs were obtained to evaluate the vascular anatomy. CONTRAST:  52mL OMNIPAQUE IOHEXOL 350 MG/ML SOLN COMPARISON:  03/06/2019 FINDINGS: Cardiovascular: No filling defects in the pulmonary arteries to suggest pulmonary emboli. Heart is normal size. Aorta normal caliber. Aortic and coronary artery atherosclerosis. Mediastinum/Nodes: There is pneumomediastinum. Gas seen around the main pulmonary artery and in the posterior mediastinum. No adenopathy. Lungs/Pleura: Volume loss on the left with elevation of the left hemidiaphragm. Findings are chronic. There is a chronic masslike area of consolidation in the left lower lobe which is similar to prior study. Advanced centrilobular emphysema and areas of scarring in both lungs. Possible small left effusion, stable. Upper Abdomen: Imaging into the upper abdomen shows no acute findings. Musculoskeletal: Cachexia.  No acute bony abnormality. Review of the MIP images confirms the above findings. IMPRESSION: Pneumomediastinum seen in the middle and posterior mediastinum. No associated pneumothorax. Advanced centrilobular emphysema. Stable chronic volume loss on the left with elevation of the left hemidiaphragm and masslike consolidation in the left lower lobe. As recommended on prior study, bronchoscopy with attention to the left lower lobe and/or PET CT may be helpful to exclude left lower lobe mass lesion. Small right pleural effusion. Areas of scarring in the lungs bilaterally. Aortic Atherosclerosis  (ICD10-I70.0) and Emphysema (ICD10-J43.9). Electronically Signed   By: Rolm Baptise M.D.   On: 06/17/2019 21:12   CT Abdomen Pelvis W Contrast  Result Date: 06/17/2019 CLINICAL DATA:  Hypercalcemia. EXAM: CT ABDOMEN AND PELVIS WITH CONTRAST TECHNIQUE: Multidetector CT imaging of the abdomen and pelvis was performed using the standard protocol following bolus administration of intravenous contrast. CONTRAST:  35mL OMNIPAQUE IOHEXOL 350 MG/ML SOLN COMPARISON:  None. FINDINGS: Lower chest: Masslike opacity seen in the left lower lobe as described on prior chest CT. Trace  left pleural effusion. Volume loss on the left with elevation of the left hemidiaphragm. Advanced emphysema. Scarring in the right lower lobe. Hepatobiliary: No focal hepatic abnormality. Gallbladder unremarkable. Pancreas: Calcifications throughout the pancreas compatible with chronic pancreatitis. No evidence of acute pancreatitis, fall suspicious pancreatic lesion or pancreatic ductal dilatation. Spleen: No focal abnormality.  Normal size. Adrenals/Urinary Tract: Small cysts in the left kidney. No hydronephrosis. Adrenal glands and urinary bladder unremarkable. Stomach/Bowel: Large stool burden throughout the colon. Distention of the rectum with stool concerning for fecal impaction. Stomach and small bowel decompressed, unremarkable. Vascular/Lymphatic: Aortic atherosclerosis. No enlarged abdominal or pelvic lymph nodes. Reproductive: Prostate prominence. Other: No free fluid or free air. Musculoskeletal: Cachexia. No acute bony abnormality. Degenerative disc and facet disease in the lower lumbar spine. IMPRESSION: Masslike opacity in the left lower lobe as described on chest CT concerning for possible pulmonary mass lesions/malignancy. This could be further evaluated with bronchoscopy and/or PET CT as described on prior chest CT. Advanced emphysema. Large stool burden throughout the colon. Appearance is concerning for fecal impaction. Chronic  pancreatitis changes.  No evidence of acute pancreatitis. Aortic atherosclerosis. Electronically Signed   By: Rolm Baptise M.D.   On: 06/17/2019 21:16   DG Chest Port 1 View  Result Date: 06/25/2019 CLINICAL DATA:  Weakness, COVID-19 positive EXAM: PORTABLE CHEST 1 VIEW COMPARISON:  06/17/2019 FINDINGS: Chronic volume loss with consolidation in the left hemithorax, stable from prior. There may be mildly increased opacity within the aerated portion of the left lung. Heart size is stable. Mediastinal structures remain shifted to the left. There is hyperexpansion of the right lung field with chronic emphysematous changes and right apical scarring. No pneumothorax. IMPRESSION: Chronic volume loss with consolidation in the left hemithorax, stable from prior. There may be mildly increased opacity within the aerated portion of the left lung, which could reflect pneumonia given the patient's history. Electronically Signed   By: Davina Poke D.O.   On: 06/25/2019 13:31   DG C-Arm 1-60 Min-No Report  Result Date: 06/30/2019 Fluoroscopy was utilized by the requesting physician.  No radiographic interpretation.   ECHOCARDIOGRAM COMPLETE  Result Date: 06/26/2019   ECHOCARDIOGRAM REPORT   Patient Name:   Corey Carr Date of Exam: 06/26/2019 Medical Rec #:  [id number]       Height:       68.0 in Accession #:    [id number]      Weight:       92.2 lb Date of Birth:  1940/07/01       BSA:          1.47 m Patient Age:    62 years        BP:           89/66 mmHg Patient Gender: M               HR:           129 bpm. Exam Location:  [facility] Procedure: 2D Echo Indications:     ATRIAL FIBRILLATION 427.31/I48.91  History:         Patient has no prior history of Echocardiogram examinations.                  COPD; Risk Factors:Hypertension.  Sonographer:     Avanell Shackleton Referring Phys:  [place] Diagnosing Phys: Ida Rogue MD  Sonographer Comments: Image acquisition challenging due to patient body habitus.  IMPRESSIONS  1. Left ventricular ejection fraction, by visual estimation, is  50 to 55%. The left ventricle has normal function. There is moderately increased left ventricular hypertrophy.  2. Left ventricular diastolic parameters are indeterminate.  3. The left ventricle has no regional wall motion abnormalities.  4. Global right ventricle has normal systolic function.The right ventricular size is normal. No increase in right ventricular wall thickness.  5. Left atrial size was normal.  6. Tricuspid valve regurgitation is mild-moderate.  7. Mildly elevated pulmonary artery systolic pressure.  8. Tachycardia noted/atrial flutter, rate 130 bpm FINDINGS  Left Ventricle: Left ventricular ejection fraction, by visual estimation, is 50 to 55%. The left ventricle has normal function. The left ventricle has no regional wall motion abnormalities. There is moderately increased left ventricular hypertrophy. Left ventricular diastolic parameters are indeterminate. Normal left atrial pressure. Right Ventricle: The right ventricular size is normal. No increase in right ventricular wall thickness. Global RV systolic function is has normal systolic function. The tricuspid regurgitant velocity is 2.90 m/s, and with an assumed right atrial pressure  of 5 mmHg, the estimated right ventricular systolic pressure is mildly elevated at 38.6 mmHg. Left Atrium: Left atrial size was normal in size. Right Atrium: Right atrial size was normal in size Pericardium: There is no evidence of pericardial effusion. Mitral Valve: The mitral valve is normal in structure. Mild to moderate mitral valve regurgitation. No evidence of mitral valve stenosis by observation. Tricuspid Valve: The tricuspid valve is normal in structure. Tricuspid valve regurgitation is mild-moderate. Aortic Valve: The aortic valve is normal in structure. Aortic valve regurgitation is not visualized. Mild to moderate aortic valve sclerosis/calcification is present, without any  evidence of aortic stenosis. Pulmonic Valve: The pulmonic valve was normal in structure. Pulmonic valve regurgitation is not visualized. Pulmonic regurgitation is not visualized. Aorta: The aortic root, ascending aorta and aortic arch are all structurally normal, with no evidence of dilitation or obstruction. Venous: The inferior vena cava is normal in size with greater than 50% respiratory variability, suggesting right atrial pressure of 3 mmHg. IAS/Shunts: No atrial level shunt detected by color flow Doppler. There is no evidence of a patent foramen ovale. No ventricular septal defect is seen or detected. There is no evidence of an atrial septal defect.  LEFT VENTRICLE PLAX 2D LVIDd:         2.60 cm LVIDs:         2.26 cm LV PW:         0.68 cm LV IVS:        0.92 cm LVOT diam:     1.70 cm LV SV:         7 ml LV SV Index:   5.30 LVOT Area:     2.27 cm  LEFT ATRIUM             Index       RIGHT ATRIUM           Index LA diam:        2.70 cm 1.84 cm/m  RA Area:     16.00 cm LA Vol (A2C):   58.4 ml 39.72 ml/m RA Volume:   45.70 ml  31.08 ml/m LA Vol (A4C):   33.6 ml 22.85 ml/m LA Biplane Vol: 48.1 ml 32.71 ml/m   AORTA Ao Root diam: 3.30 cm TRICUSPID VALVE TR Peak grad:   33.6 mmHg TR Vmax:        305.00 cm/s  SHUNTS Systemic Diam: 1.70 cm  Ida Rogue MD Electronically signed by Ida Rogue MD Signature Date/Time: 06/26/2019/7:28:13  PM    Final    [place] EKG SITE RITE  Result Date: 06/26/2019 If Site Rite image not attached, placement could not be confirmed due to current cardiac rhythm.   ASSESSMENT: Failure to thrive, squamous cell carcinoma of the lung.  PLAN:    1.  Shortness of breath: Likely multifactorial including malignancy and worsening pneumomediastinum.  He has no evidence of pneumonia or pulmonary embolism.  Continue oxygen as needed. 2.  Failure to thrive: Patient's performance status is slowly been declining over the past month and he has been unable to initiate treatment for his  malignancy even to multiple hospital admissions.  PT and OT to evaluate. 3.  Squamous cell carcinoma lung: Despite efforts, patient has been unable to complete PET scan secondary to multiple hospital admissions.  CT of the head is negative.  He likely has stage III or stage IV disease, but will need PET scan to complete the staging work-up.  Given his declining performance status, it is unclear if he would be able to tolerate treatments, but immunotherapy may be a possibility.  Appreciate palliative care input. 4.  Leukocytosis: Possibly reactive, no obvious evidence of infection.  Patient's white blood cell count has been persistently elevated for the past several weeks.  Monitor.  Appreciate consult, will follow.   Lloyd Huger, MD   07/09/2019 8:37 PM

## 2019-07-09 NOTE — Progress Notes (Signed)
Patient called out, sliding down in the bed. Assisted up in the bed and repositioned for comfort. Bolus finished. Patient to be started on maintenance fluids.

## 2019-07-09 NOTE — Evaluation (Signed)
Physical Therapy Evaluation Patient Details Name: Corey Carr MRN: [id number] DOB: 1940-10-16 Today's Date: 07/09/2019   History of Present Illness  Corey Carr is a 65yoM who comes to [facility] 07/08/19 after acute onset worseing weakness and SOB. Pt is familiar to our services from prior admissions. 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 Pt has continued to have advancing cachexia over th epast several weaks.  Clinical Impression  Pt admitted with above diagnosis. Pt currently with functional limitations due to the deficits listed below (see "PT Problem List"). Upon entry, pt in bed, awake and agreeable to participate. Chief Strategy Officer familiar with patient from prior admissions. The pt is alert and oriented x4, pleasant, conversational, and generally a good historian. Pt reports basic mobility being performed generally well despite cachexia, however decided to call 911 d/t acute onset sudden weakness. Pt on 4L/min upon entry 91% (3L at home), moves to EOB with drop to 65% SpO2 which does not improve. Pt moved to 8L/min c [name] with recovery to 77% SpO2 over 2 minutes, RN notified who arrives to assist. Mobility back to supine on 8L/min with similar drop to 60s% SpO2, back to 89-91% on 8L after 2-3 minutes supine (slight HOB elevation). HR increases to 120s during this episode with eventual resolution. Functional mobility assessment demonstrates increased effort/time requirements, poor tolerance, but no need for physical assistance, whereas the patient performed these at a higher level of independence PTA. Pt has been admitted several time recently, not clear that he has been successful in establishing HHPT services d/t insurance barriers, hence it is not clear what pt's course will be, however he may find more support in home from palliative care at this point, as he continues to have failure to thrive. Pt will benefit from skilled PT intervention to increase independence  and safety with basic mobility in preparation for discharge to the venue listed below.       Follow Up Recommendations Home health PT;Supervision for mobility/OOB;Supervision - Intermittent;Other (comment)((in-home hospice?))    Equipment Recommendations  None recommended by PT    Recommendations for Other Services       Precautions / Restrictions Precautions Precautions: Fall Precaution Comments: High Fall Restrictions Weight Bearing Restrictions: No      Mobility  Bed Mobility Overal bed mobility: Modified Independent             General bed mobility comments: Pt perfoms with additional time/effort. SpO2 drops to 65% on 4L, moved to 6L without resolution, mved to 8L with gradual recovery to 77%, at which point RN is retrieved. Pt subjectively unchanged.  Transfers Overall transfer level: (deferred d/t weakness and O2 issues)                  Ambulation/Gait                Stairs            Wheelchair Mobility    Modified Rankin (Stroke Patients Only)       Balance Overall balance assessment: Mild deficits observed, not formally tested;Modified Independent Sitting-balance support: Feet supported;No upper extremity supported Sitting balance-Leahy Scale: Good                                       Pertinent Vitals/Pain Pain Assessment: No/denies pain    Home Living Family/patient expects to be discharged to:: Private residence Living  Arrangements: Spouse/significant other Available Help at Discharge: Family;Available 24 hours/day Type of Home: Mobile home Home Access: Stairs to enter Entrance Stairs-Rails: Chemical engineer of Steps: 5 Home Layout: One level Home Equipment: Walker - 2 wheels;Walker - 4 wheels;Cane - single point;Toilet riser;Grab bars - tub/shower      Prior Function Level of Independence: Independent with assistive device(s)         Comments: Per pt continues to mobilize in the  home with RW fairly well despite cachexia and ARF.     Hand Dominance        Extremity/Trunk Assessment                Communication   Communication: No difficulties  Cognition Arousal/Alertness: Awake/alert Behavior During Therapy: WFL for tasks assessed/performed Overall Cognitive Status: Within Functional Limits for tasks assessed                                        General Comments      Exercises     Assessment/Plan    PT Assessment Patient needs continued PT services  PT Problem List Decreased strength;Decreased activity tolerance;Decreased balance;Decreased mobility       PT Treatment Interventions DME instruction;Balance training;Gait training;Functional mobility training;Therapeutic activities;Therapeutic exercise;Patient/family education    PT Goals (Current goals can be found in the Care Plan section)  Acute Rehab PT Goals Patient Stated Goal: To return home PT Goal Formulation: With patient Time For Goal Achievement: 07/23/19 Potential to Achieve Goals: Fair    Frequency Min 2X/week   Barriers to discharge Decreased caregiver support      Co-evaluation               AM-PAC PT "6 Clicks" Mobility  Outcome Measure Help needed turning from your back to your side while in a flat bed without using bedrails?: A Little Help needed moving from lying on your back to sitting on the side of a flat bed without using bedrails?: A Little Help needed moving to and from a bed to a chair (including a wheelchair)?: A Little Help needed standing up from a chair using your arms (e.g., wheelchair or bedside chair)?: A Little Help needed to walk in hospital room?: A Little Help needed climbing 3-5 steps with a railing? : A Little 6 Click Score: 18    End of Session Equipment Utilized During Treatment: Oxygen Activity Tolerance: Patient tolerated treatment well;No increased pain;Treatment limited secondary to medical complications  (Comment) Patient left: in bed;with call bell/phone within reach;with nursing/sitter in room Nurse Communication: Mobility status PT Visit Diagnosis: Difficulty in walking, not elsewhere classified (R26.2);Muscle weakness (generalized) (M62.81);Unsteadiness on feet (R26.81)    Time: 5361-4431 PT Time Calculation (min) (ACUTE ONLY): 28 min   Charges:   PT Evaluation $PT Eval Moderate Complexity: 1 Mod          4:59 PM, 07/09/19 Etta Grandchild, PT, DPT Physical Therapist - [facility]  [phone] ([place])   Elycia Woodside C 07/09/2019, 4:54 PM

## 2019-07-09 NOTE — Progress Notes (Signed)
Report given to [facility] on [place]. Patient to be transported to room.

## 2019-07-10 ENCOUNTER — Inpatient Hospital Stay: Payer: Medicare Other

## 2019-07-10 ENCOUNTER — Ambulatory Visit: Admission: RE | Admit: 2019-07-10 | Payer: Medicare Other | Source: Ambulatory Visit

## 2019-07-10 LAB — BASIC METABOLIC PANEL
Anion gap: 4 — ABNORMAL LOW (ref 5–15)
BUN: 15 mg/dL (ref 8–23)
CO2: 24 mmol/L (ref 22–32)
Calcium: 8.2 mg/dL — ABNORMAL LOW (ref 8.9–10.3)
Chloride: 109 mmol/L (ref 98–111)
Creatinine, Ser: 0.72 mg/dL (ref 0.61–1.24)
GFR calc Af Amer: >60 mL/min (ref >60)
GFR calc non Af Amer: >60 mL/min (ref >60)
Glucose, Bld: 101 mg/dL — ABNORMAL HIGH (ref 70–99)
Potassium: 3.8 mmol/L (ref 3.5–5.1)
Sodium: 137 mmol/L (ref 135–145)

## 2019-07-10 LAB — CBC
HCT: 22.5 % — ABNORMAL LOW (ref 39.0–52.0)
Hemoglobin: 7.6 g/dL — ABNORMAL LOW (ref 13.0–17.0)
MCH: 28 pg (ref 26.0–34.0)
MCHC: 33.8 g/dL (ref 30.0–36.0)
MCV: 83 fL (ref 80.0–100.0)
Platelets: 458 10*3/uL — ABNORMAL HIGH (ref 150–400)
RBC: 2.71 MIL/uL — ABNORMAL LOW (ref 4.22–5.81)
RDW: 17.2 % — ABNORMAL HIGH (ref 11.5–15.5)
WBC: 19.1 10*3/uL — ABNORMAL HIGH (ref 4.0–10.5)
nRBC: 0 % (ref 0.0–0.2)

## 2019-07-10 LAB — PHOSPHORUS: Phosphorus: 1.4 mg/dL — ABNORMAL LOW (ref 2.5–4.6)

## 2019-07-10 LAB — MAGNESIUM: Magnesium: 1.6 mg/dL — ABNORMAL LOW (ref 1.7–2.4)

## 2019-07-10 LAB — PROCALCITONIN: Procalcitonin: <0.1 ng/mL

## 2019-07-10 MED ORDER — BISACODYL 5 MG PO TBEC
10.0000 mg | DELAYED_RELEASE_TABLET | Freq: Two times a day (BID) | ORAL | Status: DC
  Administered 2019-07-10 – 2019-07-16 (×10): 10 mg via ORAL
  Filled 2019-07-10 (×11): qty 2

## 2019-07-10 MED ORDER — MAGNESIUM SULFATE 2 GM/50ML IV SOLN
2.0000 g | INTRAVENOUS | Status: AC
  Administered 2019-07-10 (×2): 2 g via INTRAVENOUS
  Filled 2019-07-10 (×2): qty 50

## 2019-07-10 MED ORDER — POTASSIUM PHOSPHATES 15 MMOLE/5ML IV SOLN
30.0000 mmol | Freq: Once | INTRAVENOUS | Status: AC
  Administered 2019-07-10: 30 mmol via INTRAVENOUS
  Filled 2019-07-10: qty 10

## 2019-07-10 MED ORDER — BISACODYL 10 MG RE SUPP
10.0000 mg | Freq: Once | RECTAL | Status: DC
  Filled 2019-07-10: qty 1

## 2019-07-10 MED ORDER — ADULT MULTIVITAMIN W/MINERALS CH
1.0000 | ORAL_TABLET | Freq: Every day | ORAL | Status: DC
  Administered 2019-07-11 – 2019-07-16 (×6): 1 via ORAL
  Filled 2019-07-10 (×6): qty 1

## 2019-07-10 NOTE — Care Management Important Message (Signed)
Important Message  Patient Details  Name: RAYGEN DAHM MRN: [id number] Date of Birth: 1940/08/23   Medicare Important Message Given:  Yes     [name] 07/10/2019, 12:16 PM

## 2019-07-10 NOTE — Evaluation (Addendum)
Occupational Therapy Evaluation Patient Details Name: Corey Carr MRN: [id number] DOB: [birth year] Today's Date: 07/10/2019    History of Present Illness Mcdonald Reiling is a 37yoM who comes to [facility] 07/08/19 after acute onset worseing weakness and SOB. Pt is familiar to our services from prior admissions. 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 Pt has continued to have advancing cachexia over th epast several weaks.   Clinical Impression   Pt was seen for OT evaluation this date. Prior to hospital admission, pt was Indep with ADLs and MOD I for fxl mobility. Pt lives in mobile home with spouse with 5 STE and bilateral railing. Currently pt demonstrates impairments as described below (See OT problem list) which functionally limit his ability to perform ADL/self-care tasks. Pt currently requires MIN A for some more dynamic seated ADLs, setup for self feeding, MIN A for LB dressing in EOB sitting. Pt performs sup<>sit with MOD I for extended time and use of bed rails. Pt politely declines to participate in fxl transfers during OT evaluation citing fatigue, but is agreeable to seated self care. Pt is noted to require extended time and demo decreased fxl activity tolerance throughout OT evaluation.  Pt would benefit from skilled OT to address noted impairments and functional limitations (see below for any additional details) in order to maximize safety and independence while minimizing falls risk and caregiver burden. Upon hospital discharge, recommend HHOT to maximize pt safety and return to functional independence during meaningful occupations of daily life.    Follow Up Recommendations  Home health OT;Supervision - Intermittent    Equipment Recommendations  Tub/shower seat    Recommendations for Other Services       Precautions / Restrictions Precautions Precautions: Fall Precaution Comments: High Fall Restrictions Weight Bearing Restrictions: No       Mobility Bed Mobility Overal bed mobility: Modified Independent             General bed mobility comments: HOB elevated, extended time.  Transfers                 General transfer comment: pt declines to attempt fxl transfer on OT assessment citing fatigue. unable to assess.    Balance Overall balance assessment: Needs assistance Sitting-balance support: Feet supported Sitting balance-Leahy Scale: Fair Sitting balance - Comments: G static sitting balance, some instance of posterior LOB with more dynamic reaching indicating F dyanmic sitting balance.       Standing balance comment: pt declines to attempt fxl transfer on OT assessment citing fatigue. unable to assess standing balance on OT evaluation this date.                           ADL either performed or assessed with clinical judgement   ADL Overall ADL's : Needs assistance/impaired Eating/Feeding: Set up;Sitting   Grooming: Minimal assistance;Sitting Grooming Details (indicate cue type and reason): pt declines to perform grooming in standing on OT assessment d/t fatigue.         Upper Body Dressing : Set up;Sitting   Lower Body Dressing: Minimal assistance;Sitting/lateral leans     Toilet Transfer Details (indicate cue type and reason): pt declines to transfer on OT assessment citing fatigue. Of note: spO2 monitored throughout, while difficult to get reading having tried multiple digits, at times that spO2 reading was able to be assessed on tele box, noted to maintain >98% on 3Lnc even with seated tasks which shows  improved tolerance versus PT evluation yesterday.                 Vision Baseline Vision/History: Wears glasses Wears Glasses: Reading only Patient Visual Report: No change from baseline       Perception     Praxis      Pertinent Vitals/Pain Pain Assessment: No/denies pain     Hand Dominance Right   Extremity/Trunk Assessment Upper Extremity  Assessment Upper Extremity Assessment: Generalized weakness(shlds, elbow, grip MMT grossly 4-/5 bilaterally, able to move shoulder through 3/4 arc of motion for flexion/abduction)   Lower Extremity Assessment Lower Extremity Assessment: Defer to PT evaluation;Generalized weakness   Cervical / Trunk Assessment Cervical / Trunk Assessment: Kyphotic   Communication Communication Communication: No difficulties   Cognition Arousal/Alertness: Awake/alert Behavior During Therapy: WFL for tasks assessed/performed Overall Cognitive Status: Within Functional Limits for tasks assessed                                 General Comments: somewhat anxious/particular with room arrangement, blankets, drips of water during washing face/brushing teeth.   General Comments       Exercises Other Exercises Other Exercises: OT facilitates pt education re: importance of OOB activity to encourage pt participation in EOB sitting to complete some UB ADLs. Pt becomes more agreeable and verbalized understanding. Other Exercises: OT facilitates pt participation in trunk extension exercise for 1 set x15 reps with 3-4 second hold per rep to increase surface area for deeper breaths. Pt requires MIN cues for form and pace. Other Exercises: OT facilitates pt participation in seated straight arm raises for 2 sets x15 reps. Pt requires MIN verbal cues for form and pace.   Shoulder Instructions      Home Living Family/patient expects to be discharged to:: Private residence Living Arrangements: Spouse/significant other Available Help at Discharge: Family;Available 24 hours/day Type of Home: Mobile home Home Access: Stairs to enter Entrance Stairs-Number of Steps: 5 Entrance Stairs-Rails: Left;Right Home Layout: One level     Bathroom Shower/Tub: Occupational psychologist: Standard     Home Equipment: Environmental consultant - 2 wheels;Walker - 4 wheels;Cane - single point;Toilet riser;Grab bars -  tub/shower          Prior Functioning/Environment Level of Independence: Independent with assistive device(s)        Comments: Per pt continues to mobilize in the home with RW fairly well despite cachexia and ARF.        OT Problem List: Decreased strength;Cardiopulmonary status limiting activity;Decreased coordination;Decreased activity tolerance;Decreased safety awareness;Impaired balance (sitting and/or standing);Decreased cognition;Decreased knowledge of use of DME or AE      OT Treatment/Interventions: Self-care/ADL training;Therapeutic exercise;Therapeutic activities;DME and/or AE instruction;Patient/family education;Balance training    OT Goals(Current goals can be found in the care plan section) Acute Rehab OT Goals Patient Stated Goal: To return home OT Goal Formulation: With patient Time For Goal Achievement: 07/24/19 Potential to Achieve Goals: Good  OT Frequency: Min 2X/week   Barriers to D/C: Inaccessible home environment;Decreased caregiver support          Co-evaluation              AM-PAC OT "6 Clicks" Daily Activity     Outcome Measure Help from another person eating meals?: A Little Help from another person taking care of personal grooming?: A Little Help from another person toileting, which includes using toliet, bedpan, or urinal?: A Little Help  from another person bathing (including washing, rinsing, drying)?: A Little Help from another person to put on and taking off regular upper body clothing?: A Little Help from another person to put on and taking off regular lower body clothing?: A Little 6 Click Score: 18   End of Session Nurse Communication: Other (comment)(notified RN that OT changed tele battery as it was warning battery low and OT attempting to ascertain clear reading of HR/SpO2)  Activity Tolerance: Patient tolerated treatment well;Patient limited by fatigue Patient left: in bed;with call bell/phone within reach;with bed alarm  set  OT Visit Diagnosis: Other abnormalities of gait and mobility (R26.89);Muscle weakness (generalized) (M62.81)                Time: 2080-2233 OT Time Calculation (min): 43 min Charges:  OT General Charges $OT Visit: 1 Visit OT Evaluation $OT Eval Moderate Complexity: 1 Mod OT Treatments $Self Care/Home Management : 8-22 mins $Therapeutic Exercise: 8-22 mins  Gerrianne Scale, MS, OTR/L ascom [phone] 07/10/19, 2:45 PM

## 2019-07-10 NOTE — Progress Notes (Signed)
[facility] Progress Note  Patient: Corey Carr    TDV:[id number]  DOA: 07/08/2019     Date of Service: the patient was seen and examined on 07/10/2019  Chief Complaint  Patient presents with   Shortness of Breath   Brief hospital course: Corey Carr is a 79 y.o. male with medical history significant of lung cancer, atrial flutter, paroxysmal A. fib, chronic respiratory failure on 3 L of oxygen, remote history of COVID-19 pneumonia back in December, prior history of hypoglycemia hypertension among other things who came into the ER today with significant shortness of breath from home.  He has felt progressively more short of breath lately.  Mostly worsened with exertion.  Denied any sick contact.  Denied any fever or chills but he has had cough.  Patient was recently diagnosed with lung cancer and had a PET scan.  He has COPD but does not feel like he is wheezing.  He did have pneumomediastinum the last time he was in the hospital.  He appears to current have pneumomediastinum which has slightly worsened.  He had bronchoscopy at the time with biopsy.  Patient is generally weak and debilitated.  He has failed to thrive essentially not eating or drinking adequately.  At this point he is being admitted to the hospital for evaluation.  He has been staying home instead of skilled facility with his wife.  But he appears to be failing to thrive at home.  ED Course: Temperature 97.7 blood pressure 142/73 pulse 88 respirate 22 oxygen sat 99% on 3 L.  White count is 16.8, hemoglobin 10.2 and platelets 655.  Sodium 136 potassium 4.2 chloride 101 CO2 21 BUN 26 creatinine 1.18 calcium 9.2 and procalcitonin 0.1.  Lactic acid 3.6.  COVID-19 testing is negative.  Urinalysis essentially negative.  CT angiogram of the chest showed stable pulmonary arteries with continuous masslike opacification of the left lower lung with worsening chronic elevation of the left hemidiaphragm.  Also increase pneumomediastinum  with subcutaneous gas tracking into the lower neck.  Head CT without contrast showed no hemorrhage or chronic ischemic microangiopathy only patient being admitted to the hospital to evaluate and treat his weakness.   Currently further plan is continuation of the nasal cannula until patient's oxygenation improves. Awaiting for pulmonology consult Thoracic surgery not available today  Assessment and Plan:  Principal Problem:   Generalized weakness Active Problems:   Failure to thrive (0-17)   Squamous cell carcinoma of left lung (HCC)   Essential hypertension   Dehydration   Chronic obstructive pulmonary disease (HCC)   AF (paroxysmal atrial fibrillation) (HCC)    #Acute on chronic hypoxic respiratory failure, presented with shortness of breath: Probably secondary to patient's lung pathology with the pneumomediastinum.   Respiratory failure was worsening but it is better, patient is back to 3LMP at baseline on 2/18  Continue on oxygen and home regimen including inhalers.   CTA chest: No evidence of pneumonia or PE.   2/18 CXR: Similar appearance of pneumomediastinum as compared to chest radiograph 06/25/2019. However, progression of pneumomediastinum was noted on prior CT 07/08/2019 Pulmonary consulted, will follow for further management   # Hypophosphatemia secondary to poor oral intake.  Phosphorus repleted IV.  Continue to monitor and replete as needed. Continue Megace  # Hypomagnesemia, magnesium repleted IV.  Continue to monitor.  # Anemia of chronic disease, Hb 7.6 dropped today possible dilutional Monitor H&H, no signs of bleeding.  # generalized weakness: Multifactorial.  Patient is slowly failing  to thrive at home.   Continue supportive care, fall precautions.  PT eval done, recommended home health PT, supervision for mobility/OOB, supervision intermittent  # dehydration: s/p IV hydration, continue to encourage oral intake.    # essential hypertension: Continue  blood pressure control using home regimen.  # advanced COPD: On home O2.  Continue  # squamous cell carcinoma of the left lung: Continue treatment by oncology.  # paroxysmal atrial fibrillation: Currently in sinus rhythm.  Continue monitoring Continued amiodarone  # Constipation for 2 weeks, started MiraLAX daily, Dulcolax twice daily and Dulcolax suppository once 2/18 Follow BM  Body mass index is 14.41 kg/m.  Severe protein calorie malnutrition secondary to underlying malignancy.  Nutrition is consulted, Interventions: Started oral supplement   Pressure Injury 06/26/19 Coccyx Medial Stage 2 -  Partial thickness loss of dermis presenting as a shallow open injury with a red, pink wound bed without slough. pink open lesions (Active)  06/26/19 0057  Location: Coccyx  Location Orientation: Medial  Staging: Stage 2 -  Partial thickness loss of dermis presenting as a shallow open injury with a red, pink wound bed without slough.  Wound Description (Comments): pink open lesions  Present on Admission: Yes     Diet: Regular diet DVT Prophylaxis: Subcutaneous Heparin    Advance goals of care discussion: DNR  Family Communication: No one fromfamily was present at bedside, at the time of interview.  The pt provided permission to discuss medical plan with the family. Opportunity was given to ask question and all questions were answered satisfactorily.   Disposition:  Pt is from home, admitted with pneumomediastinum, acute on chronic hypoxic respiratory failure.  Shortness of breath improved, patient is back to 3 L at baseline.  Noticed hypophosphatemia and hypomagnesemia which will be repleted today.  Awaiting for pulmonary consult, texted yesterday and today as well.  Patient has hypophosphatemia, hypomagnesemia and dyspnea on exertion, which precludes a safe discharge. Discharge to Home with HH/PT, when electrolyte will be normalized and dyspnea on exertion will be stabilized, and when  cleared by pulmonologist.   Subjective:  No overnight issues, patient was seen and examined at bedside.  Patient feels improvement in the shortness of breath.  Appetite is improving, patient was eating breakfast. Patient denied any chest pain or palpitations, no abdominal pain no nausea vomiting or diarrhea.  Physical Exam: General:  alert oriented to time, place, and person.  Appear in mild distress, affect appropriate Eyes: PERRLA ENT: Oral Mucosa Clear, moist  Neck: no JVD,  Cardiovascular: S1 and S2 Present, no Murmur,  Respiratory: good respiratory effort, Bilateral Air entry equal and Decreased, no Crackles, no wheezes Abdomen: Bowel Sound present, Soft and no tenderness,  Skin: no rashes Extremities: no Pedal edema, no calf tenderness Neurologic: without any new focal findings Gait not checked due to patient safety concerns  Vitals:   07/09/19 1605 07/09/19 2023 07/10/19 0429 07/10/19 1143  BP:  122/76 122/71 123/61  Pulse:  70 71 75  Resp:  20 20   Temp:  98.7 F (37.1 C) 97.7 F (36.5 C) (!) 97.4 F (36.3 C)  TempSrc:  Oral Oral Oral  SpO2: 93% 100% 100% 100%  Weight:      Height:        Intake/Output Summary (Last 24 hours) at 07/10/2019 1308 Last data filed at 07/10/2019 0539 Gross per 24 hour  Intake 3105.96 ml  Output 1050 ml  Net 2055.96 ml   Filed Weights   07/08/19 1742  Weight: 43 kg    Data Reviewed: I have personally reviewed and interpreted daily labs, tele strips, imagings as discussed above. I reviewed all nursing notes, pharmacy notes, vitals, pertinent old records I have discussed plan of care as described above with RN and patient/family.  CBC: Recent Labs  Lab 07/08/19 1811 07/09/19 0629 07/10/19 0501  WBC 16.8* 21.8* 19.1*  NEUTROABS 15.5*  --   --   HGB 10.2* 8.3* 7.6*  HCT 31.5* 25.1* 22.5*  MCV 84.7 81.8 83.0  PLT 655* 513* 810*   Basic Metabolic Panel: Recent Labs  Lab 07/08/19 1811 07/09/19 0629 07/10/19 0501  NA 136  137 137  K 4.4 4.2 3.8  CL 101 107 109  CO2 21* 24 24  GLUCOSE 140* 111* 101*  BUN 26* 18 15  CREATININE 1.18 0.83 0.72  CALCIUM 9.2 8.4* 8.2*  MG  --   --  1.6*  PHOS  --   --  1.4*    Studies: DG Chest 1 View  Result Date: 07/10/2019 CLINICAL DATA:  Pneumomediastinum. Additional history provided: History of hypertension. Former smoker. EXAM: CHEST  1 VIEW COMPARISON:  CT angiogram chest 07/08/2019, chest radiograph 06/25/2019 FINDINGS: The cardiomediastinal silhouette is unchanged, although largely obscured. Persistent curvilinear opacity projecting along the lateral aspect of the descending thoracic aorta consistent with previously demonstrated pneumomediastinum. Unchanged masslike consolidation within the left lower lung with chronic elevation of the left hemidiaphragm. A left pleural effusion is difficult to exclude. Redemonstrated background emphysematous change with biapical pleuroparenchymal scarring. Chronically increased interstitial markings within the aerated portions of the left lung. A right lower lobe pulmonary nodule was better appreciated on recent prior CT. No evidence of pneumothorax. No acute bony abnormality. IMPRESSION: 1. Similar appearance of pneumomediastinum as compared to chest radiograph 06/25/2019. However, progression of pneumomediastinum was noted on prior CT 07/08/2019. 2. Unchanged masslike consolidation within the left lower lung with chronic elevation of the left hemidiaphragm. 3. Redemonstrated background bullous emphysema with biapical pleuroparenchymal scarring. 4. A right lower lobe pulmonary nodule was better appreciated on recent prior CT. Electronically Signed   By: Kellie Simmering DO   On: 07/10/2019 11:21    Scheduled Meds:  albuterol  3 mL Inhalation See admin instructions   amiodarone  200 mg Oral BID   vitamin C  500 mg Oral Daily   aspirin EC  325 mg Oral Daily   bisacodyl  10 mg Oral BID   bisacodyl  10 mg Rectal Once   dexamethasone  4 mg  Oral Daily   feeding supplement (ENSURE ENLIVE)  237 mL Oral TID BM   fluticasone furoate-vilanterol  1 puff Inhalation Daily   And   umeclidinium bromide  1 puff Inhalation Daily   heparin  5,000 Units Subcutaneous Q8H   loratadine  10 mg Oral Daily   megestrol  40 mg Oral Daily   [START ON 07/11/2019] multivitamin with minerals  1 tablet Oral Daily   omega-3 acid ethyl esters  1 g Oral Daily   pantoprazole  40 mg Oral Daily   polyethylene glycol  17 g Oral BID   vitamin B-12  500 mcg Oral Daily   vitamin E  400 Units Oral Daily   Continuous Infusions:  dextrose 5 % and 0.9% NaCl 125 mL/hr at 07/10/19 0539   potassium PHOSPHATE IVPB (in mmol) 30 mmol (07/10/19 1024)   PRN Meds: acetaminophen **OR** acetaminophen, fluticasone, HYDROcodone-acetaminophen, ondansetron **OR** ondansetron (ZOFRAN) IV  Time spent: 35 minutes  Author: Fabio Neighbors  Dwyane Dee MD [facility] 07/10/2019 1:08 PM  To reach On-call, see care teams to locate the attending and reach out to them via [email or web address]. If 7PM-7AM, please contact night-coverage If you still have difficulty reaching the attending provider, please page the [facility] (Director on Call) for [facility] on amion for assistance.

## 2019-07-10 NOTE — Progress Notes (Signed)
   07/10/19 1300  Clinical Encounter Type  Visited With Patient  Visit Type Initial  Referral From Chaplain  Consult/Referral To Chaplain  Chaplain stopped in to see patient, but occupational therapist came at the same time, therefore Chaplain told patient she was checking on him and she left.

## 2019-07-10 NOTE — Progress Notes (Signed)
Initial Nutrition Assessment  DOCUMENTATION CODES:   Severe malnutrition in context of chronic illness  INTERVENTION:   Ensure Enlive po TID, each supplement provides 350 kcal and 20 grams of protein  Magic cup TID with meals, each supplement provides 290 kcal and 9 grams of protein  Provide daily MVI  Liberalize diet   Pt at high refeed risk; recommend monitor K, Mg and P labs daily until stable.   NUTRITION DIAGNOSIS:   Severe Malnutrition related to chronic illness(COPD, lung mass) as evidenced by severe fat depletion, severe muscle depletion, 30.2% weight loss over 4 months.  GOAL:   Patient will meet greater than or equal to 90% of their needs  MONITOR:   PO intake, Supplement acceptance, Labs, Weight trends, Skin, I & O's  REASON FOR ASSESSMENT:   Other (Comment)(Low BMI)    ASSESSMENT:   79 y/o male with h/o HTN, COPD, Afib, COVID PNA, severe malnutrition who is admitted with SOB and FTT   Pt is well known to nutrition department and this RD from multiple previous admits. Pt with poor appetite and oral intake at baseline. Pt does enjoy drinking chocolate supplements. Pt eating only sips and bites of meals during his last admit. RD will add supplements and vitamins to help pt meet his estimated needs. Pt is at high refeed risk.   Patient's UBW was 145 lbs (65.9 kg). He was 69.5 kg on 11/14/2016 but there was limited weight history after that until recently. He was 56.7 kg on 03/06/2019, 53.1 kg on 04/16/2019, 42.3 kg on 06/18/2019, and is now 39.6 kg (87.3 lbs). He has now lost 17.1 kg (30.2% body weight) over almost 4 months, which is significant for time frame.  Medications reviewed and include: vitamin C, aspirin, dexamethasone, heparin, megace, protonix, miralax, B12, vitamin E, NaCl w/ 5% dextrose @125ml /hr, KPhos  Labs reviewed: K 3.8 wnl, P 1.4(L), Mg 1.6(L) Wbc- 19.1(H), Hgb 7.6(L), Hct 22.5(L)  NUTRITION - FOCUSED PHYSICAL EXAM:    Most Recent Value   Orbital Region  Severe depletion  Upper Arm Region  Severe depletion  Thoracic and Lumbar Region  Severe depletion  Buccal Region  Severe depletion  Temple Region  Severe depletion  Clavicle Bone Region  Severe depletion  Clavicle and Acromion Bone Region  Severe depletion  Scapular Bone Region  Severe depletion  Dorsal Hand  Severe depletion  Patellar Region  Severe depletion  Anterior Thigh Region  Severe depletion  Posterior Calf Region  Severe depletion  Edema (RD Assessment)  None  Hair  Reviewed  Eyes  Reviewed  Mouth  Reviewed  Skin  Reviewed  Nails  Reviewed     Diet Order:   Diet Order            Diet Heart Room service appropriate? Yes; Fluid consistency: Thin  Diet effective now             EDUCATION NEEDS:   Education needs have been addressed  Skin:  Skin Assessment: Reviewed RN Assessment(ecchymosis, MASD, Stage II coccyx)  Last BM:  PTA  Height:   Ht Readings from Last 1 Encounters:  07/08/19 5\' 8"  (1.727 m)   Weight:   Wt Readings from Last 1 Encounters:  07/08/19 43 kg   Ideal Body Weight:  70 kg  BMI:  Body mass index is 14.41 kg/m.  Estimated Nutritional Needs:   Kcal:  1400-1600kcal/day  Protein:  70-80g/day  Fluid:  1.4-1.6L/day  Koleen Distance MS, RD, LDN Contact information available in  Amion

## 2019-07-10 NOTE — Progress Notes (Signed)
Physical Therapy Treatment Patient Details Name: WHITTEN ANDREONI MRN: [id number] DOB: Oct 17, 1940 Today's Date: 07/10/2019    History of Present Illness Chauncey Sciulli is a 29yoM who comes to [facility] 07/08/19 after acute onset worseing weakness and SOB. Pt is familiar to our services from prior admissions. 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 Pt has continued to have advancing cachexia over th epast several weaks.    PT Comments    Patient alert and willing to participate in physical therapy. At first hesitant about getting out of bed stating "I need to rest a bit more to get stronger." When educated that resting could make him weaker and that he needed to get out of bed to get stronger, pt wanted to participate and stated he wanted to participate in rehab daily to get better like his wife did. He said she was in similar shape but went to rehab and is back to being independent and driving. Pt with taped on finger pulse-ox that demonstrated 98% SpO2 on 3L/min O2 at start of visit. With mobility, unable to get a reliable reading on taped on or portable pulse ox, so eventually let symptom response guide mobility allowing pt to stand and ambulate around foot of bed and complete standing marches at edge of bed. Patient did not become uncomfortably short of breath, although he did show signs of increased dyspnea with exertion. Patient appears to have experienced a decline in functional independence and mobility compared to PLOF has a desire to participate in rehab, so updated discharge recommendations to SNF for short term rehab to allow him to return home safely. Patient would benefit from continued skilled physical therapy to address remaining impairments and functional limitations to work towards stated goals and return to PLOF or maximal functional independence.      Follow Up Recommendations  SNF     Equipment Recommendations  None recommended by PT     Recommendations for Other Services       Precautions / Restrictions Precautions Precautions: Fall Precaution Comments: High Fall Restrictions Weight Bearing Restrictions: No    Mobility  Bed Mobility Overal bed mobility: Modified Independent             General bed mobility comments: HOB elevated, extended time.  Transfers Overall transfer level: Needs assistance Equipment used: Standard walker Transfers: Sit to/from Stand Sit to Stand: Min guard         General transfer comment: Patient was wary about completing transfers and ambulation until he was educated that it would help him get stronger. Reports he wants to get rehab and get stronger like his wife. Completed sit <> stand from low bed x 4 to RW and with CGA for safety  Ambulation/Gait Ambulation/Gait assistance: Min guard Gait Distance (Feet): 15 Feet(2x15 (around end of bed)) Assistive device: Rolling walker (2 wheeled) Gait Pattern/deviations: Step-through pattern Gait velocity: very slow   General Gait Details: Patient ambulated around end of bed from one side to the other and back using RW and CGA. Patient demo increased breathing rate. Unable to get reliable rating on pulse ox so used symptomatic response to guide appropriate tasks. Also stood at edge of bed and marched x30 reps on each side while holding RW with supervision.   Stairs             Wheelchair Mobility    Modified Rankin (Stroke Patients Only)       Balance Overall balance assessment: Needs assistance Sitting-balance  support: Feet supported Sitting balance-Leahy Scale: Good Sitting balance - Comments: steady sitting balance at edge of bed.     Standing balance-Leahy Scale: Fair Standing balance comment: requires BUE support on RW for balance.                            Cognition Arousal/Alertness: Awake/alert Behavior During Therapy: WFL for tasks assessed/performed Overall Cognitive Status: Within  Functional Limits for tasks assessed                                 General Comments: very particular about how bed sheets and blankets arranged      Exercises Other Exercises Other Exercises: standing marching with BUE support on RW x 30 and x 10 Other Exercises: sit <> stand from/to bed using RW x 4    General Comments        Pertinent Vitals/Pain Pain Assessment: No/denies pain    Home Living                      Prior Function            PT Goals (current goals can now be found in the care plan section) Acute Rehab PT Goals Patient Stated Goal: To get stronger and participate in rehab daily Time For Goal Achievement: 07/24/19 Potential to Achieve Goals: Fair    Frequency    Min 2X/week      PT Plan Discharge plan needs to be updated    Co-evaluation              AM-PAC PT "6 Clicks" Mobility   Outcome Measure  Help needed turning from your back to your side while in a flat bed without using bedrails?: None Help needed moving from lying on your back to sitting on the side of a flat bed without using bedrails?: None Help needed moving to and from a bed to a chair (including a wheelchair)?: A Little Help needed standing up from a chair using your arms (e.g., wheelchair or bedside chair)?: A Little Help needed to walk in hospital room?: A Little Help needed climbing 3-5 steps with a railing? : A Little 6 Click Score: 20    End of Session Equipment Utilized During Treatment: Oxygen(3L/min) Activity Tolerance: Patient tolerated treatment well;No increased pain;Patient limited by fatigue(unable to get reliable reading on pulse ox, so went by symptomatic response after attempting to use pulse ox, pt limited by dyspnea and fatigue, but did not have labor of breathing that concerned him) Patient left: with call bell/phone within reach;in bed;with bed alarm set Nurse Communication: Mobility status PT Visit Diagnosis: Difficulty in  walking, not elsewhere classified (R26.2);Muscle weakness (generalized) (M62.81);Unsteadiness on feet (R26.81)     Time: 1456-1530 PT Time Calculation (min) (ACUTE ONLY): 34 min  Charges:  $Gait Training: 8-22 mins $Therapeutic Activity: 8-22 mins                     Everlean Alstrom. Graylon Good, PT, DPT 07/10/19, 7:42 PM

## 2019-07-11 ENCOUNTER — Inpatient Hospital Stay: Payer: Medicare Other | Admitting: Oncology

## 2019-07-11 LAB — BASIC METABOLIC PANEL
Anion gap: 5 (ref 5–15)
BUN: 14 mg/dL (ref 8–23)
CO2: 23 mmol/L (ref 22–32)
Calcium: 8.1 mg/dL — ABNORMAL LOW (ref 8.9–10.3)
Chloride: 110 mmol/L (ref 98–111)
Creatinine, Ser: 0.67 mg/dL (ref 0.61–1.24)
GFR calc Af Amer: >60 mL/min (ref >60)
GFR calc non Af Amer: >60 mL/min (ref >60)
Glucose, Bld: 113 mg/dL — ABNORMAL HIGH (ref 70–99)
Potassium: 4 mmol/L (ref 3.5–5.1)
Sodium: 138 mmol/L (ref 135–145)

## 2019-07-11 LAB — CBC
HCT: 22.3 % — ABNORMAL LOW (ref 39.0–52.0)
Hemoglobin: 7.5 g/dL — ABNORMAL LOW (ref 13.0–17.0)
MCH: 27.9 pg (ref 26.0–34.0)
MCHC: 33.6 g/dL (ref 30.0–36.0)
MCV: 82.9 fL (ref 80.0–100.0)
Platelets: 455 10*3/uL — ABNORMAL HIGH (ref 150–400)
RBC: 2.69 MIL/uL — ABNORMAL LOW (ref 4.22–5.81)
RDW: 17.5 % — ABNORMAL HIGH (ref 11.5–15.5)
WBC: 19.1 10*3/uL — ABNORMAL HIGH (ref 4.0–10.5)
nRBC: 0 % (ref 0.0–0.2)

## 2019-07-11 LAB — PHOSPHORUS: Phosphorus: 2 mg/dL — ABNORMAL LOW (ref 2.5–4.6)

## 2019-07-11 LAB — MAGNESIUM: Magnesium: 2.1 mg/dL (ref 1.7–2.4)

## 2019-07-11 MED ORDER — SODIUM CHLORIDE 0.9 % IV SOLN: INTRAVENOUS | Status: DC

## 2019-07-11 MED ORDER — SODIUM CHLORIDE 0.9 % IV SOLN
500.0000 mg | INTRAVENOUS | Status: DC
  Administered 2019-07-11: 11:00:00 500 mg via INTRAVENOUS
  Filled 2019-07-11: qty 500

## 2019-07-11 MED ORDER — MINERAL OIL RE ENEM
1.0000 | ENEMA | Freq: Once | RECTAL | Status: AC
  Administered 2019-07-11: 1 via RECTAL

## 2019-07-11 MED ORDER — SODIUM CHLORIDE 0.9 % IV SOLN
100.0000 mg | Freq: Two times a day (BID) | INTRAVENOUS | Status: DC
  Administered 2019-07-12 – 2019-07-15 (×8): 100 mg via INTRAVENOUS
  Filled 2019-07-11 (×11): qty 100

## 2019-07-11 MED ORDER — MEGESTROL ACETATE 20 MG PO TABS
40.0000 mg | ORAL_TABLET | Freq: Two times a day (BID) | ORAL | Status: DC
  Filled 2019-07-11: qty 2

## 2019-07-11 MED ORDER — SODIUM CHLORIDE 0.9 % IV SOLN
1.0000 g | INTRAVENOUS | Status: DC
  Administered 2019-07-11 – 2019-07-15 (×5): 1 g via INTRAVENOUS
  Filled 2019-07-11 (×3): qty 1
  Filled 2019-07-11: qty 10
  Filled 2019-07-11: qty 1
  Filled 2019-07-11: qty 10

## 2019-07-11 MED ORDER — MEGESTROL ACETATE 400 MG/10ML PO SUSP
400.0000 mg | Freq: Every day | ORAL | Status: DC
  Administered 2019-07-11 – 2019-07-16 (×6): 400 mg via ORAL
  Filled 2019-07-11 (×6): qty 10

## 2019-07-11 MED ORDER — SODIUM PHOSPHATES 45 MMOLE/15ML IV SOLN
20.0000 mmol | Freq: Once | INTRAVENOUS | Status: AC
  Administered 2019-07-11: 20 mmol via INTRAVENOUS
  Filled 2019-07-11: qty 6.67

## 2019-07-11 NOTE — Progress Notes (Signed)
I spoke with patient's wife by phone.  Updated her on patient's current status.  She says the patient has been declining since March 2020.  She seems to recognize the significance of his cancer and verbalized that she would not be surprised if patient were nearing end-of-life.  Despite that, she would like an attempt to treat the treatable and would be interested in cancer treatment if any are available.  She does seem to recognize that cancer treatment options might be limited by his poor performance status.    Time Total: 15 minutes  Visit consisted of counseling and education dealing with the complex and emotionally intense issues of symptom management and palliative care in the setting of serious and potentially life-threatening illness.Greater than 50%  of this time was spent counseling and coordinating care related to the above assessment and plan.  Signed by: Altha Harm, PhD, NP-C

## 2019-07-11 NOTE — Progress Notes (Signed)
[facility] Progress Note  Patient: Corey Carr    VCB:[id number]  DOA: 07/08/2019     Date of Service: the patient was seen and examined on 07/11/2019  Chief Complaint  Patient presents with  . Shortness of Breath   Brief hospital course: Corey Carr is a 79 y.o. male with medical history significant of lung cancer, atrial flutter, paroxysmal A. fib, chronic respiratory failure on 3 L of oxygen, remote history of COVID-19 pneumonia back in December, prior history of hypoglycemia hypertension among other things who came into the ER today with significant shortness of breath from home.  He has felt progressively more short of breath lately.  Mostly worsened with exertion.  Denied any sick contact.  Denied any fever or chills but he has had cough.  Patient was recently diagnosed with lung cancer and had a PET scan.  He has COPD but does not feel like he is wheezing.  He did have pneumomediastinum the last time he was in the hospital.  He appears to current have pneumomediastinum which has slightly worsened.  He had bronchoscopy at the time with biopsy.  Patient is generally weak and debilitated.  He has failed to thrive essentially not eating or drinking adequately.  At this point he is being admitted to the hospital for evaluation.  He has been staying home instead of skilled facility with his wife.  But he appears to be failing to thrive at home.  ED Course: Temperature 97.7 blood pressure 142/73 pulse 88 respirate 22 oxygen sat 99% on 3 L.  White count is 16.8, hemoglobin 10.2 and platelets 655.  Sodium 136 potassium 4.2 chloride 101 CO2 21 BUN 26 creatinine 1.18 calcium 9.2 and procalcitonin 0.1.  Lactic acid 3.6.  COVID-19 testing is negative.  Urinalysis essentially negative.  CT angiogram of the chest showed stable pulmonary arteries with continuous masslike opacification of the left lower lung with worsening chronic elevation of the left hemidiaphragm.  Also increase pneumomediastinum  with subcutaneous gas tracking into the lower neck.  Head CT without contrast showed no hemorrhage or chronic ischemic microangiopathy only patient being admitted to the hospital to evaluate and treat his weakness.   Currently further plan is continuation of the nasal cannula until patient's oxygenation improves. Awaiting for pulmonology consult Thoracic surgery not available today  Assessment and Plan:  Principal Problem:   Generalized weakness Active Problems:   Failure to thrive (0-17)   Squamous cell carcinoma of left lung (HCC)   Essential hypertension   Dehydration   Chronic obstructive pulmonary disease (HCC)   AF (paroxysmal atrial fibrillation) (HCC)    # Acute on chronic hypoxic respiratory failure, presented with shortness of breath: Probably secondary to patient's lung pathology with the pneumomediastinum.   Respiratory failure was worsening but it is better, patient is back to 3LMP at baseline on 2/18  Continue on oxygen and home regimen including inhalers.   CTA chest: No evidence of pneumonia or PE.   2/18 CXR: Similar appearance of pneumomediastinum as compared to chest radiograph 06/25/2019. However, progression of pneumomediastinum was noted on prior CT 07/08/2019 Pulmonary consulted, recommended antibiotics for possible community-acquired pneumonia started ceftriaxone and doxycycline.  Leukocytosis most likely reactive due to underlying malignancy  # Pneumomediastinum secondary to bolus COPD and obstruction due to left lower lobe mass Pulmonology recommended no intervention, continue gentle spirometry and chest percussion Avoid positive pressure ventilation  # Severe protein calorie malnutrition, decreased appetite secondary to underlying malignancy related cachexia.  Continue Megace  #  Hypophosphatemia secondary to poor oral intake.  Phosphorus repleted IV.  Continue to monitor and replete as needed. Continue Megace  # Hypomagnesemia, magnesium repleted IV.   Continue to monitor.  # Anemia of chronic disease, Hb 7.6 dropped today possible dilutional Monitor H&H, no signs of bleeding.  # Generalized weakness: Multifactorial.  Patient is slowly failing to thrive at home.   Continue supportive care, fall precautions.  PT eval done, recommended home health PT, supervision for mobility/OOB, supervision intermittent  # dehydration:  Continue maintenance IV fluids NS 50 mL/h, continue to encourage oral intake.    # essential hypertension: Continue blood pressure control using home regimen.  # advanced COPD: On home O2.  Continue  # squamous cell carcinoma of the left lung: Continue treatment by oncology. Oncology plan to start immunotherapy when patient will be stable, not a candidate for chemotherapy due to recurrent hospitalization secondary to respiratory failure.  # paroxysmal atrial fibrillation: Currently in sinus rhythm.  Continue monitoring Continued amiodarone.  Patient is not on NOAC, follow with cardiology as an outpatient  # Constipation for 2 weeks, started MiraLAX daily, Dulcolax twice daily and Dulcolax suppository once 2/18.  No BM yet, 2/19 ordered mineral oil enema today Follow BM  Body mass index is 14.41 kg/m.  Severe protein calorie malnutrition secondary to underlying malignancy.  Nutrition is consulted, Interventions: Started oral supplement   Pressure Injury 06/26/19 Coccyx Medial Stage 2 -  Partial thickness loss of dermis presenting as a shallow open injury with a red, pink wound bed without slough. pink open lesions (Active)  06/26/19 0057  Location: Coccyx  Location Orientation: Medial  Staging: Stage 2 -  Partial thickness loss of dermis presenting as a shallow open injury with a red, pink wound bed without slough.  Wound Description (Comments): pink open lesions  Present on Admission: Yes     Diet: Regular diet DVT Prophylaxis: Subcutaneous Heparin    Advance goals of care discussion: DNR  Family  Communication: No one fromfamily was present at bedside, at the time of interview.  The pt provided permission to discuss medical plan with the family. Opportunity was given to ask question and all questions were answered satisfactorily.   Disposition:  Pt is from home, admitted with pneumomediastinum, acute on chronic hypoxic respiratory failure.  Shortness of breath improved, patient is back to 3 L at baseline.  Still poor appetite, mild dehydration and hypophosphatemia which will be repleted.  Seen by pulmonary today, started antibiotics.   Patient has constipation did not move bowels for 2 weeks so bowel regimen has been ordered Patient has hypophosphatemia, constipation and poor appetite, dehydration which precludes a safe discharge. Discharge to SNF, when electrolyte will be normalized, constipation resolves and cleared by pulmonologist.  Possible discharge in 1 to 2 days  Subjective:  No overnight issues, patient was seen and examined at bedside.  Patient feels improvement in the shortness of breath.  Appetite is improving, no BM yet as per RN. Patient denied any chest pain or palpitations, no abdominal pain no nausea vomiting or diarrhea.  Physical Exam: General:  alert oriented to time, place, and person.  Appear in mild distress, affect appropriate Eyes: PERRLA ENT: Oral Mucosa Clear, moist  Neck: no JVD,  Cardiovascular: S1 and S2 Present, no Murmur,  Respiratory: good respiratory effort, Bilateral Air entry equal and Decreased, no Crackles, no wheezes Abdomen: Bowel Sound present, Soft and no tenderness,  Skin: no rashes Extremities: no Pedal edema, no calf tenderness Neurologic: without  any new focal findings Gait not checked due to patient safety concerns  Vitals:   07/10/19 1938 07/10/19 1957 07/11/19 0506 07/11/19 0643  BP: 137/72  (!) 145/74   Pulse: 68  72   Resp: (!) 32 (!) 22 18   Temp: 98.5 F (36.9 C)  97.8 F (36.6 C)   TempSrc: Oral  Oral   SpO2: 100%  100%  100%  Weight:      Height:        Intake/Output Summary (Last 24 hours) at 07/11/2019 1439 Last data filed at 07/11/2019 1100 Gross per 24 hour  Intake 3431.09 ml  Output 3100 ml  Net 331.09 ml   Filed Weights   07/08/19 1742  Weight: 43 kg    Data Reviewed: I have personally reviewed and interpreted daily labs, tele strips, imagings as discussed above. I reviewed all nursing notes, pharmacy notes, vitals, pertinent old records I have discussed plan of care as described above with RN and patient/family.  CBC: Recent Labs  Lab 07/08/19 1811 07/09/19 0629 07/10/19 0501 07/11/19 0316  WBC 16.8* 21.8* 19.1* 19.1*  NEUTROABS 15.5*  --   --   --   HGB 10.2* 8.3* 7.6* 7.5*  HCT 31.5* 25.1* 22.5* 22.3*  MCV 84.7 81.8 83.0 82.9  PLT 655* 513* 458* 193*   Basic Metabolic Panel: Recent Labs  Lab 07/08/19 1811 07/09/19 0629 07/10/19 0501 07/11/19 0316  NA 136 137 137 138  K 4.4 4.2 3.8 4.0  CL 101 107 109 110  CO2 21* 24 24 23   GLUCOSE 140* 111* 101* 113*  BUN 26* 18 15 14   CREATININE 1.18 0.83 0.72 0.67  CALCIUM 9.2 8.4* 8.2* 8.1*  MG  --   --  1.6* 2.1  PHOS  --   --  1.4* 2.0*    Studies: No results found.  Scheduled Meds: . albuterol  3 mL Inhalation See admin instructions  . amiodarone  200 mg Oral BID  . vitamin C  500 mg Oral Daily  . aspirin EC  325 mg Oral Daily  . bisacodyl  10 mg Oral BID  . bisacodyl  10 mg Rectal Once  . dexamethasone  4 mg Oral Daily  . feeding supplement (ENSURE ENLIVE)  237 mL Oral TID BM  . fluticasone furoate-vilanterol  1 puff Inhalation Daily   And  . umeclidinium bromide  1 puff Inhalation Daily  . heparin  5,000 Units Subcutaneous Q8H  . loratadine  10 mg Oral Daily  . megestrol  400 mg Oral Daily  . multivitamin with minerals  1 tablet Oral Daily  . omega-3 acid ethyl esters  1 g Oral Daily  . pantoprazole  40 mg Oral Daily  . polyethylene glycol  17 g Oral BID  . vitamin B-12  500 mcg Oral Daily  . vitamin E  400  Units Oral Daily   Continuous Infusions: . sodium chloride 50 mL/hr at 07/11/19 1049  . cefTRIAXone (ROCEPHIN)  IV 1 g (07/11/19 1217)  . [START ON 07/12/2019] doxycycline (VIBRAMYCIN) IV    . sodium phosphate  Dextrose 5% IVPB 20 mmol (07/11/19 0920)   PRN Meds: acetaminophen **OR** acetaminophen, fluticasone, HYDROcodone-acetaminophen, ondansetron **OR** ondansetron (ZOFRAN) IV  Time spent: 35 minutes  Author: Val Riles. MD [facility] 07/11/2019 2:39 PM  To reach On-call, see care teams to locate the attending and reach out to them via [email or web address]. If 7PM-7AM, please contact night-coverage If you still have difficulty reaching the attending provider, please  page the [facility] (Director on Call) for [facility] on amion for assistance.

## 2019-07-11 NOTE — Progress Notes (Signed)
Mechanical Percussor not available at this time and patient is very frail for manual CPT; Therefore,  patient was set up with acapella for CPT therapy. Patient did very well with therapy, understood directions, and had no trouble following them.

## 2019-07-11 NOTE — Progress Notes (Signed)
PT Cancellation Note  Patient Details Name: Corey Carr MRN: [id number] DOB: 16-Aug-1940   Cancelled Treatment:    Reason Eval/Treat Not Completed: Other (comment) Chart reviewed; spoke with nursing prior to attempting to see pt, states "Just gave him an enema, probably not right now."  PT may be able to go back as time allows, however PT deferred at this time.  Kreg Shropshire, DPT 07/11/2019, 4:58 PM

## 2019-07-11 NOTE — Consult Note (Signed)
Pulmonary Medicine          Date: 07/11/2019,   MRN# [id number] Corey Carr Apr 02, 1941     AdmissionWeight: 43 kg                 CurrentWeight: 43 kg  Referring physician: Dr. Dwyane Dee    CHIEF COMPLAINT:   Failure to thrive associated with lung cancer   HISTORY OF PRESENT ILLNESS   This is a very pleasant 79 year old male with a history of advanced COPD, essential hypertension, chronic fatigue with cachexia likely due to cancer associated weight loss with protein calorie malnutrition, was seen in pulmonary on outpatient basis for abnormal chest x-ray.  Subsequently patient did have CT chest imaging with findings of left infiltrate with concomitant suggestion of mass and hilar/mediastinal lymphadenopathy.  Patient was supposed to have a PET scan done however had a hypoglycemic episode and was subsequently seen in the ED.  He was also found to have narrow complex tachycardia as well as atrial flutter with rapid ventricular response and is status post cardiology evaluation with medical optimization patient did have bronchoscopy with biopsy showing squamous cell lung cancer and is in process of being evaluated by oncology however this has been complicated due to recurrent admissions as patient is overall very frail and cachectic.  He has severe bullous emphysema with end-stage COPD as well as interim development of pneumomediastinum.  He complains of no respiratory distress, is nontachypneic and is asymptomatic on 1 to 2 L/min nasal cannula.  Pulmonary consultation was placed due to complicated pulmonary process with bullous emphysema, pneumomediastinum, left lower lobe squamous cell lung cancer.   PAST MEDICAL HISTORY   Past Medical History:  Diagnosis Date  . Atrial flutter ([place])    CHADsVASc 3  . Hypertension      SURGICAL HISTORY   Past Surgical History:  Procedure Laterality Date  . KNEE SURGERY Right 1957  . VIDEO BRONCHOSCOPY WITH ENDOBRONCHIAL NAVIGATION N/A  06/30/2019   Procedure: VIDEO BRONCHOSCOPY WITH ENDOBRONCHIAL NAVIGATION;  Surgeon: Ottie Glazier, MD;  Location: [facility] ORS;  Service: Thoracic;  Laterality: N/A;  . VIDEO BRONCHOSCOPY WITH ENDOBRONCHIAL ULTRASOUND N/A 06/30/2019   Procedure: VIDEO BRONCHOSCOPY WITH ENDOBRONCHIAL ULTRASOUND;  Surgeon: Ottie Glazier, MD;  Location: [facility] ORS;  Service: Thoracic;  Laterality: N/A;     FAMILY HISTORY   Family History  Problem Relation Age of Onset  . Emphysema Father   . Lung cancer Maternal Uncle      SOCIAL HISTORY   Social History   Tobacco Use  . Smoking status: Former Smoker    Packs/day: 0.50    Years: 60.00    Pack years: 30.00    Types: Cigarettes    Quit date: 2015    Years since quitting: 6.1  . Smokeless tobacco: Never Used  Substance Use Topics  . Alcohol use: No  . Drug use: No     MEDICATIONS    Home Medication:    Current Medication:  Current Facility-Administered Medications:  .  acetaminophen (TYLENOL) tablet 650 mg, 650 mg, Oral, Q6H PRN **OR** acetaminophen (TYLENOL) suppository 650 mg, 650 mg, Rectal, Q6H PRN, Garba, Mohammad L, MD .  albuterol (PROVENTIL) (2.5 MG/3ML) 0.083% nebulizer solution 3 mL, 3 mL, Inhalation, See admin instructions, Gala Romney L, MD .  amiodarone (PACERONE) tablet 200 mg, 200 mg, Oral, BID, Gala Romney L, MD, 200 mg at 07/11/19 0814 .  ascorbic acid (VITAMIN C) tablet 500 mg, 500 mg, Oral, Daily, Jonelle Sidle, [place]  L, MD, 500 mg at 07/11/19 0815 .  aspirin EC tablet 325 mg, 325 mg, Oral, Daily, Gala Romney L, MD, 325 mg at 07/11/19 0814 .  bisacodyl (DULCOLAX) EC tablet 10 mg, 10 mg, Oral, BID, Val Riles, MD, 10 mg at 07/11/19 0814 .  bisacodyl (DULCOLAX) suppository 10 mg, 10 mg, Rectal, Once, Val Riles, MD .  dexamethasone (DECADRON) tablet 4 mg, 4 mg, Oral, Daily, Jonelle Sidle, Mohammad L, MD, 4 mg at 07/11/19 0815 .  dextrose 5 %-0.9 % sodium chloride infusion, , Intravenous, Continuous, Gala Romney L, MD,  Last Rate: 125 mL/hr at 07/11/19 0643, Rate Verify at 07/11/19 0643 .  feeding supplement (ENSURE ENLIVE) (ENSURE ENLIVE) liquid 237 mL, 237 mL, Oral, TID BM, Garba, Mohammad L, MD, 237 mL at 07/11/19 0813 .  fluticasone (FLONASE) 50 MCG/ACT nasal spray 2 spray, 2 spray, Each Nare, Daily PRN, Garba, Mohammad L, MD .  fluticasone furoate-vilanterol (BREO ELLIPTA) 100-25 MCG/INH 1 puff, 1 puff, Inhalation, Daily, 1 puff at 07/11/19 0816 **AND** umeclidinium bromide (INCRUSE ELLIPTA) 62.5 MCG/INH 1 puff, 1 puff, Inhalation, Daily, Hallaji, Sheema M, RPH, 1 puff at 07/11/19 0815 .  heparin injection 5,000 Units, 5,000 Units, Subcutaneous, Q8H, Garba, Mohammad L, MD, 5,000 Units at 07/11/19 0500 .  HYDROcodone-acetaminophen (NORCO/VICODIN) 5-325 MG per tablet 1 tablet, 1 tablet, Oral, Q6H PRN, Jonelle Sidle, Mohammad L, MD .  loratadine (CLARITIN) tablet 10 mg, 10 mg, Oral, Daily, Gala Romney L, MD, 10 mg at 07/11/19 0814 .  megestrol (MEGACE) tablet 40 mg, 40 mg, Oral, Daily, Jonelle Sidle, Mohammad L, MD, 40 mg at 07/11/19 0815 .  multivitamin with minerals tablet 1 tablet, 1 tablet, Oral, Daily, Val Riles, MD, 1 tablet at 07/11/19 0814 .  omega-3 acid ethyl esters (LOVAZA) capsule 1 g, 1 g, Oral, Daily, Jonelle Sidle, Mohammad L, MD, 1 g at 07/11/19 0815 .  ondansetron (ZOFRAN) tablet 4 mg, 4 mg, Oral, Q6H PRN **OR** ondansetron (ZOFRAN) injection 4 mg, 4 mg, Intravenous, Q6H PRN, Garba, Mohammad L, MD .  pantoprazole (PROTONIX) EC tablet 40 mg, 40 mg, Oral, Daily, Gala Romney L, MD, 40 mg at 07/11/19 0814 .  polyethylene glycol (MIRALAX / GLYCOLAX) packet 17 g, 17 g, Oral, BID, Jonelle Sidle, Mohammad L, MD, 17 g at 07/11/19 0813 .  sodium phosphate 20 mmol in dextrose 5 % 250 mL infusion, 20 mmol, Intravenous, Once, Val Riles, MD, Last Rate: 43 mL/hr at 07/11/19 0920, 20 mmol at 07/11/19 0920 .  vitamin B-12 (CYANOCOBALAMIN) tablet 500 mcg, 500 mcg, Oral, Daily, Gala Romney L, MD, 500 mcg at 07/11/19 0815 .   vitamin E capsule 400 Units, 400 Units, Oral, Daily, Elwyn Reach, MD, 400 Units at 07/11/19 0815    ALLERGIES   Patient has no known allergies.     REVIEW OF SYSTEMS    Review of Systems:  Gen:  Denies  fever, sweats, chills weigh loss  HEENT: Denies blurred vision, double vision, ear pain, eye pain, hearing loss, nose bleeds, sore throat Cardiac:  No dizziness, chest pain or heaviness, chest tightness,edema Resp:   Denies cough or sputum porduction, shortness of breath,wheezing, hemoptysis,  Gi: Denies swallowing difficulty, stomach pain, nausea or vomiting, diarrhea, constipation, bowel incontinence Gu:  Denies bladder incontinence, burning urine Ext:   Denies Joint pain, stiffness or swelling Skin: Denies  skin rash, easy bruising or bleeding or hives Endoc:  Denies polyuria, polydipsia , polyphagia or weight change Psych:   Denies depression, insomnia or hallucinations   Other:  All other  systems negative   VS: BP (!) 145/74 (BP Location: Right Arm)   Pulse 72   Temp 97.8 F (36.6 C) (Oral)   Resp 18   Ht 5\' 8"  (1.727 m)   Wt 43 kg   SpO2 100%   BMI 14.41 kg/m      PHYSICAL EXAM    GENERAL:NAD, no fevers, chills, no weakness no fatigue HEAD: Normocephalic, atraumatic.  EYES: Pupils equal, round, reactive to light. Extraocular muscles intact. No scleral icterus.  MOUTH: Moist mucosal membrane. Dentition intact. No abscess noted.  EAR, NOSE, THROAT: Clear without exudates. No external lesions.  NECK: Supple. No thyromegaly. No nodules. No JVD.  PULMONARY: Decreased breath sounds bilaterally worse at the left lower lung zone CARDIOVASCULAR: S1 and S2. Regular rate and rhythm. No murmurs, rubs, or gallops. No edema. Pedal pulses 2+ bilaterally.  GASTROINTESTINAL: Soft, nontender, nondistended. No masses. Positive bowel sounds. No hepatosplenomegaly.  MUSCULOSKELETAL: No swelling, clubbing, or edema. Range of motion full in all extremities.  NEUROLOGIC:  Cranial nerves II through XII are intact. No gross focal neurological deficits. Sensation intact. Reflexes intact.  SKIN: No ulceration, lesions, rashes, or cyanosis. Skin warm and dry. Turgor intact.  PSYCHIATRIC: Mood, affect within normal limits. The patient is awake, alert and oriented x 3. Insight, judgment intact.       IMAGING    DG Chest 1 View  Result Date: 07/10/2019 CLINICAL DATA:  Pneumomediastinum. Additional history provided: History of hypertension. Former smoker. EXAM: CHEST  1 VIEW COMPARISON:  CT angiogram chest 07/08/2019, chest radiograph 06/25/2019 FINDINGS: The cardiomediastinal silhouette is unchanged, although largely obscured. Persistent curvilinear opacity projecting along the lateral aspect of the descending thoracic aorta consistent with previously demonstrated pneumomediastinum. Unchanged masslike consolidation within the left lower lung with chronic elevation of the left hemidiaphragm. A left pleural effusion is difficult to exclude. Redemonstrated background emphysematous change with biapical pleuroparenchymal scarring. Chronically increased interstitial markings within the aerated portions of the left lung. A right lower lobe pulmonary nodule was better appreciated on recent prior CT. No evidence of pneumothorax. No acute bony abnormality. IMPRESSION: 1. Similar appearance of pneumomediastinum as compared to chest radiograph 06/25/2019. However, progression of pneumomediastinum was noted on prior CT 07/08/2019. 2. Unchanged masslike consolidation within the left lower lung with chronic elevation of the left hemidiaphragm. 3. Redemonstrated background bullous emphysema with biapical pleuroparenchymal scarring. 4. A right lower lobe pulmonary nodule was better appreciated on recent prior CT. Electronically Signed   By: Kellie Simmering DO   On: 07/10/2019 11:21   DG Chest 2 View  Result Date: 06/17/2019 CLINICAL DATA:  Dizziness and shortness of breath. EXAM: CHEST - 2 VIEW  COMPARISON:  05/23/2019. FINDINGS: Similar appearance of volume loss in the left hemithorax with elevation of the left hemidiaphragm and left base collapse/consolidative opacity. There is diffuse interstitial and pleural opacity in the left chest. Right lung is hyperexpanded without focal consolidation or pleural effusion. Bones are diffusely demineralized. IMPRESSION: Largely stable exam. Marked volume loss left hemithorax with posterior collapse/consolidative change in diffuse pleural thickening. Electronically Signed   By: Misty Stanley M.D.   On: 06/17/2019 16:43   CT Head Wo Contrast  Result Date: 07/08/2019 CLINICAL DATA:  Lung cancer. Assessment for hemorrhage in the setting of possible need for anticoagulation. EXAM: CT HEAD WITHOUT CONTRAST TECHNIQUE: Contiguous axial images were obtained from the base of the skull through the vertex without intravenous contrast. COMPARISON:  None. FINDINGS: Brain: There is no mass, hemorrhage or extra-axial collection. There  is generalized atrophy without lobar predilection. Hypodensity of the white matter is most commonly associated with chronic microvascular disease. Vascular: No abnormal hyperdensity of the major intracranial arteries or dural venous sinuses. No intracranial atherosclerosis. Skull: The visualized skull base, calvarium and extracranial soft tissues are normal. Sinuses/Orbits: No fluid levels or advanced mucosal thickening of the visualized paranasal sinuses. No mastoid or middle ear effusion. The orbits are normal. IMPRESSION: 1. No hemorrhage or other acute abnormality. 2. Chronic ischemic microangiopathy and generalized volume loss. Electronically Signed   By: Ulyses Jarred M.D.   On: 07/08/2019 19:37   CT Angio Chest PE W and/or Wo Contrast  Result Date: 07/08/2019 CLINICAL DATA:  79 year old male with shortness of breath, chronic oxygen. Currently being evaluated for possible left lower lobe mass. EXAM: CT ANGIOGRAPHY CHEST WITH CONTRAST  TECHNIQUE: Multidetector CT imaging of the chest was performed using the standard protocol during bolus administration of intravenous contrast. Multiplanar CT image reconstructions and MIPs were obtained to evaluate the vascular anatomy. CONTRAST:  18mL OMNIPAQUE IOHEXOL 350 MG/ML SOLN COMPARISON:  CTA chest and CT Abdomen and Pelvis 06/17/2019. FINDINGS: Cardiovascular: Good contrast bolus timing in the pulmonary arterial tree. Pulmonary artery patency in the left lung appears stable since last month, with obliterated left lower lobe pulmonary arteries. No central or hilar pulmonary embolus. The right lung pulmonary arteries appear patent and within normal limits. No aortic contrast today. Calcified aortic atherosclerosis. The heart remain shifted over to the left. No cardiomegaly or pericardial effusion is evident. Mediastinum/Nodes: Increased pneumomediastinum since 06/17/2019, gas now tracking into the bilateral lower neck and also toward the core of the diaphragm. The left hilum is indistinct as before, but elsewhere no mediastinal or right hilar lymphadenopathy is identified. Lungs/Pleura: The left lower lobe bronchus more abruptly terminates now on series 7, image 44 and there is no residual more distal left lower lobe airway pneumatization as there was last month. Complete masslike opacification of the left lower lobe otherwise appears stable. Superimposed bullous emphysema, left upper lobe and right lung base predominant scarring. An indeterminate superior segment right lower lobe 8-9 mm oval nodule is stable from last month, new since 03/05/2020. No pleural effusion or new right lung opacity. Upper Abdomen: Elevation of the left hemidiaphragm again suspected. The stomach and splenic flexure are visible on series 5, image 75. There is stool mixed with oral contrast or other dense material in the colon. Evidence of chronic calcific pancreatitis. Grossly negative visible noncontrast liver, kidneys and adrenal  glands. Musculoskeletal: Osteopenia. No acute or suspicious osseous lesion identified. Review of the MIP images confirms the above findings. IMPRESSION: 1. Stable pulmonary arteries, obliterated in the left lower lung but with no evidence of acute PE. 2. Continued mass-like opacification of the left lower lung with worsening abrupt termination of the lower lobe bronchus again highly suspicious for tumor. There is superimposed chronic elevation of the left hemidiaphragm. There is increased pneumomediastinum, now with subcutaneous gas tracking into the lower neck. It appears a PET-CT was scheduled for 06/25/2019 but did not occur. 3. Underlying bullous Emphysema (ICD10-J43.9). Aortic Atherosclerosis (ICD10-I70.0). Electronically Signed   By: Genevie Ann M.D.   On: 07/08/2019 19:36   CT Angio Chest PE W/Cm &/Or Wo Cm  Result Date: 06/17/2019 CLINICAL DATA:  Shortness of breath EXAM: CT ANGIOGRAPHY CHEST WITH CONTRAST TECHNIQUE: Multidetector CT imaging of the chest was performed using the standard protocol during bolus administration of intravenous contrast. Multiplanar CT image reconstructions and MIPs were obtained to evaluate  the vascular anatomy. CONTRAST:  61mL OMNIPAQUE IOHEXOL 350 MG/ML SOLN COMPARISON:  03/06/2019 FINDINGS: Cardiovascular: No filling defects in the pulmonary arteries to suggest pulmonary emboli. Heart is normal size. Aorta normal caliber. Aortic and coronary artery atherosclerosis. Mediastinum/Nodes: There is pneumomediastinum. Gas seen around the main pulmonary artery and in the posterior mediastinum. No adenopathy. Lungs/Pleura: Volume loss on the left with elevation of the left hemidiaphragm. Findings are chronic. There is a chronic masslike area of consolidation in the left lower lobe which is similar to prior study. Advanced centrilobular emphysema and areas of scarring in both lungs. Possible small left effusion, stable. Upper Abdomen: Imaging into the upper abdomen shows no acute  findings. Musculoskeletal: Cachexia.  No acute bony abnormality. Review of the MIP images confirms the above findings. IMPRESSION: Pneumomediastinum seen in the middle and posterior mediastinum. No associated pneumothorax. Advanced centrilobular emphysema. Stable chronic volume loss on the left with elevation of the left hemidiaphragm and masslike consolidation in the left lower lobe. As recommended on prior study, bronchoscopy with attention to the left lower lobe and/or PET CT may be helpful to exclude left lower lobe mass lesion. Small right pleural effusion. Areas of scarring in the lungs bilaterally. Aortic Atherosclerosis (ICD10-I70.0) and Emphysema (ICD10-J43.9). Electronically Signed   By: Rolm Baptise M.D.   On: 06/17/2019 21:12   CT Abdomen Pelvis W Contrast  Result Date: 06/17/2019 CLINICAL DATA:  Hypercalcemia. EXAM: CT ABDOMEN AND PELVIS WITH CONTRAST TECHNIQUE: Multidetector CT imaging of the abdomen and pelvis was performed using the standard protocol following bolus administration of intravenous contrast. CONTRAST:  79mL OMNIPAQUE IOHEXOL 350 MG/ML SOLN COMPARISON:  None. FINDINGS: Lower chest: Masslike opacity seen in the left lower lobe as described on prior chest CT. Trace left pleural effusion. Volume loss on the left with elevation of the left hemidiaphragm. Advanced emphysema. Scarring in the right lower lobe. Hepatobiliary: No focal hepatic abnormality. Gallbladder unremarkable. Pancreas: Calcifications throughout the pancreas compatible with chronic pancreatitis. No evidence of acute pancreatitis, fall suspicious pancreatic lesion or pancreatic ductal dilatation. Spleen: No focal abnormality.  Normal size. Adrenals/Urinary Tract: Small cysts in the left kidney. No hydronephrosis. Adrenal glands and urinary bladder unremarkable. Stomach/Bowel: Large stool burden throughout the colon. Distention of the rectum with stool concerning for fecal impaction. Stomach and small bowel decompressed,  unremarkable. Vascular/Lymphatic: Aortic atherosclerosis. No enlarged abdominal or pelvic lymph nodes. Reproductive: Prostate prominence. Other: No free fluid or free air. Musculoskeletal: Cachexia. No acute bony abnormality. Degenerative disc and facet disease in the lower lumbar spine. IMPRESSION: Masslike opacity in the left lower lobe as described on chest CT concerning for possible pulmonary mass lesions/malignancy. This could be further evaluated with bronchoscopy and/or PET CT as described on prior chest CT. Advanced emphysema. Large stool burden throughout the colon. Appearance is concerning for fecal impaction. Chronic pancreatitis changes.  No evidence of acute pancreatitis. Aortic atherosclerosis. Electronically Signed   By: Rolm Baptise M.D.   On: 06/17/2019 21:16   DG Chest Port 1 View  Result Date: 06/25/2019 CLINICAL DATA:  Weakness, COVID-19 positive EXAM: PORTABLE CHEST 1 VIEW COMPARISON:  06/17/2019 FINDINGS: Chronic volume loss with consolidation in the left hemithorax, stable from prior. There may be mildly increased opacity within the aerated portion of the left lung. Heart size is stable. Mediastinal structures remain shifted to the left. There is hyperexpansion of the right lung field with chronic emphysematous changes and right apical scarring. No pneumothorax. IMPRESSION: Chronic volume loss with consolidation in the left hemithorax, stable from  prior. There may be mildly increased opacity within the aerated portion of the left lung, which could reflect pneumonia given the patient's history. Electronically Signed   By: Davina Poke D.O.   On: 06/25/2019 13:31   DG C-Arm 1-60 Min-No Report  Result Date: 06/30/2019 Fluoroscopy was utilized by the requesting physician.  No radiographic interpretation.   ECHOCARDIOGRAM COMPLETE  Result Date: 06/26/2019   ECHOCARDIOGRAM REPORT   Patient Name:   ALEXEI DOSWELL Date of Exam: 06/26/2019 Medical Rec #:  [id number]       Height:       68.0  in Accession #:    [id number]      Weight:       92.2 lb Date of Birth:  1940/07/11       BSA:          1.47 m Patient Age:    11 years        BP:           89/66 mmHg Patient Gender: M               HR:           129 bpm. Exam Location:  [facility] Procedure: 2D Echo Indications:     ATRIAL FIBRILLATION 427.31/I48.91  History:         Patient has no prior history of Echocardiogram examinations.                  COPD; Risk Factors:Hypertension.  Sonographer:     Avanell Shackleton Referring Phys:  [place] Diagnosing Phys: Ida Rogue MD  Sonographer Comments: Image acquisition challenging due to patient body habitus. IMPRESSIONS  1. Left ventricular ejection fraction, by visual estimation, is 50 to 55%. The left ventricle has normal function. There is moderately increased left ventricular hypertrophy.  2. Left ventricular diastolic parameters are indeterminate.  3. The left ventricle has no regional wall motion abnormalities.  4. Global right ventricle has normal systolic function.The right ventricular size is normal. No increase in right ventricular wall thickness.  5. Left atrial size was normal.  6. Tricuspid valve regurgitation is mild-moderate.  7. Mildly elevated pulmonary artery systolic pressure.  8. Tachycardia noted/atrial flutter, rate 130 bpm FINDINGS  Left Ventricle: Left ventricular ejection fraction, by visual estimation, is 50 to 55%. The left ventricle has normal function. The left ventricle has no regional wall motion abnormalities. There is moderately increased left ventricular hypertrophy. Left ventricular diastolic parameters are indeterminate. Normal left atrial pressure. Right Ventricle: The right ventricular size is normal. No increase in right ventricular wall thickness. Global RV systolic function is has normal systolic function. The tricuspid regurgitant velocity is 2.90 m/s, and with an assumed right atrial pressure  of 5 mmHg, the estimated right ventricular systolic pressure is  mildly elevated at 38.6 mmHg. Left Atrium: Left atrial size was normal in size. Right Atrium: Right atrial size was normal in size Pericardium: There is no evidence of pericardial effusion. Mitral Valve: The mitral valve is normal in structure. Mild to moderate mitral valve regurgitation. No evidence of mitral valve stenosis by observation. Tricuspid Valve: The tricuspid valve is normal in structure. Tricuspid valve regurgitation is mild-moderate. Aortic Valve: The aortic valve is normal in structure. Aortic valve regurgitation is not visualized. Mild to moderate aortic valve sclerosis/calcification is present, without any evidence of aortic stenosis. Pulmonic Valve: The pulmonic valve was normal in structure. Pulmonic valve regurgitation is not visualized. Pulmonic regurgitation is not visualized. Aorta:  The aortic root, ascending aorta and aortic arch are all structurally normal, with no evidence of dilitation or obstruction. Venous: The inferior vena cava is normal in size with greater than 50% respiratory variability, suggesting right atrial pressure of 3 mmHg. IAS/Shunts: No atrial level shunt detected by color flow Doppler. There is no evidence of a patent foramen ovale. No ventricular septal defect is seen or detected. There is no evidence of an atrial septal defect.  LEFT VENTRICLE PLAX 2D LVIDd:         2.60 cm LVIDs:         2.26 cm LV PW:         0.68 cm LV IVS:        0.92 cm LVOT diam:     1.70 cm LV SV:         7 ml LV SV Index:   5.30 LVOT Area:     2.27 cm  LEFT ATRIUM             Index       RIGHT ATRIUM           Index LA diam:        2.70 cm 1.84 cm/m  RA Area:     16.00 cm LA Vol (A2C):   58.4 ml 39.72 ml/m RA Volume:   45.70 ml  31.08 ml/m LA Vol (A4C):   33.6 ml 22.85 ml/m LA Biplane Vol: 48.1 ml 32.71 ml/m   AORTA Ao Root diam: 3.30 cm TRICUSPID VALVE TR Peak grad:   33.6 mmHg TR Vmax:        305.00 cm/s  SHUNTS Systemic Diam: 1.70 cm  Ida Rogue MD Electronically signed by Ida Rogue MD Signature Date/Time: 06/26/2019/7:28:13 PM    Final    [place] EKG SITE RITE  Result Date: 06/26/2019 If Site Rite image not attached, placement could not be confirmed due to current cardiac rhythm.     ASSESSMENT/PLAN    Acute on chronic hypoxemic respiratory failure -Due to severe bullous emphysema with COPD as well as left lower lobe postobstructive cancer-related process with atelectasis -Recommend incentive spirometry and chest physiotherapy - Empiric community-acquired pneumonia Therapy-patient with elevated WBC unlikely from steroids alone -We will place on Rocephin and Zithromax IV -Agree with dexamethasone 4 mg once daily     Left lower lobe squamous cell lung CA-stage III-IV -Status post tissue diagnosis-oncology on case Dr. Grayland Ormond appreciate input -There is plan for possible immunotherapy based on patient's condition and performance status -Has had recurrent admissions for worsening respiratory status -Agree with palliative care evaluation as per Dr. Grayland Ormond    Pneumomediastinum - Supportive care -avoid positive pressure ventilation if possible -narcotic cough suppresant to minimize forceful cough with goal to decrease shearing forces and allow escaped air to resorb -Chest physiotherapy with percussor and vest -incentive spirometer at bedside please encourage to use    Cancer associated Cachexia    - patient reports being dehydrated at home, states he has poor appetite, he is on megace 40 once daily , will increase to BID   - dietary evaluation with RD consultation    - Severe Protein calorie malnutrition - overall poor prognosis   - PT/OT     Thank you for allowing me to participate in the care of this patient.   Patient/Family are satisfied with care plan and all questions have been answered.   This document was prepared using Dragon voice recognition software and may include unintentional dictation errors.  Ottie Glazier, M.D.  Division  of [place]

## 2019-07-12 ENCOUNTER — Inpatient Hospital Stay: Payer: Medicare Other

## 2019-07-12 DIAGNOSIS — J449 Chronic obstructive pulmonary disease, unspecified: Secondary | ICD-10-CM

## 2019-07-12 DIAGNOSIS — R6251 Failure to thrive (child): Secondary | ICD-10-CM

## 2019-07-12 DIAGNOSIS — I480 Paroxysmal atrial fibrillation: Secondary | ICD-10-CM

## 2019-07-12 DIAGNOSIS — I10 Essential (primary) hypertension: Secondary | ICD-10-CM

## 2019-07-12 DIAGNOSIS — E860 Dehydration: Secondary | ICD-10-CM

## 2019-07-12 DIAGNOSIS — C3492 Malignant neoplasm of unspecified part of left bronchus or lung: Secondary | ICD-10-CM

## 2019-07-12 LAB — CBC
HCT: 22.8 % — ABNORMAL LOW (ref 39.0–52.0)
Hemoglobin: 7.6 g/dL — ABNORMAL LOW (ref 13.0–17.0)
MCH: 27.5 pg (ref 26.0–34.0)
MCHC: 33.3 g/dL (ref 30.0–36.0)
MCV: 82.6 fL (ref 80.0–100.0)
Platelets: 439 10*3/uL — ABNORMAL HIGH (ref 150–400)
RBC: 2.76 MIL/uL — ABNORMAL LOW (ref 4.22–5.81)
RDW: 17.6 % — ABNORMAL HIGH (ref 11.5–15.5)
WBC: 20 10*3/uL — ABNORMAL HIGH (ref 4.0–10.5)
nRBC: 0 % (ref 0.0–0.2)

## 2019-07-12 LAB — BASIC METABOLIC PANEL
Anion gap: 6 (ref 5–15)
BUN: 16 mg/dL (ref 8–23)
CO2: 22 mmol/L (ref 22–32)
Calcium: 8.1 mg/dL — ABNORMAL LOW (ref 8.9–10.3)
Chloride: 109 mmol/L (ref 98–111)
Creatinine, Ser: 0.7 mg/dL (ref 0.61–1.24)
GFR calc Af Amer: >60 mL/min (ref >60)
GFR calc non Af Amer: >60 mL/min (ref >60)
Glucose, Bld: 77 mg/dL (ref 70–99)
Potassium: 4 mmol/L (ref 3.5–5.1)
Sodium: 137 mmol/L (ref 135–145)

## 2019-07-12 LAB — MAGNESIUM: Magnesium: 1.9 mg/dL (ref 1.7–2.4)

## 2019-07-12 LAB — PHOSPHORUS: Phosphorus: 2.5 mg/dL (ref 2.5–4.6)

## 2019-07-12 MED ORDER — SODIUM CHLORIDE 0.9 % IV SOLN
INTRAVENOUS | Status: DC | PRN
  Administered 2019-07-12: 250 mL via INTRAVENOUS

## 2019-07-12 NOTE — Progress Notes (Signed)
Physical Therapy Treatment Patient Details Name: Corey Carr MRN: [id number] DOB: 21-Apr-1941 Today's Date: 07/12/2019    History of Present Illness Corey Carr is a 76yoM who comes to [facility] 07/08/19 after acute onset worseing weakness and SOB. Pt is familiar to our services from prior admissions. 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 Pt has continued to have advancing cachexia over th epast several weaks.    PT Comments    Pt was long sitting in bed upon arriving. He is A and O x 4 and cooperative throughout. Agreeable to PT session and OOB activity. At rest on 3 L o2 with sao2 96% HR 88bpm. He was able to exit R side of bed (HOB elevated) with increased time. Sat EOB x several minutes prior to standing to RW and ambulation one lap ( 160 ft) around unit. 3 standing rest required 2/2 to fatigue and drop in O2. O2 had to be increased to 5 L during gait training to maintain > 90% however once returned to bed was able to go back to 3 L o2 with maintaining > 90 %. Overall pt tolerated session well. He was repositioned in bed with call bell in reach, bed alarm set, and bed at lowest height. Pt is progressing well with PT and will continue to be followed per POC.   Follow Up Recommendations  SNF     Equipment Recommendations  None recommended by PT    Recommendations for Other Services       Precautions / Restrictions Precautions Precautions: Fall Precaution Comments: High Fall Restrictions Weight Bearing Restrictions: No    Mobility  Bed Mobility Overal bed mobility: Modified Independent             General bed mobility comments: HOB elevated, extended time.  Transfers Overall transfer level: Needs assistance Equipment used: Rolling walker (2 wheeled) Transfers: Sit to/from Stand Sit to Stand: Min guard         General transfer comment: CGA for safety with transfers  Ambulation/Gait Ambulation/Gait assistance: Min  guard Gait Distance (Feet): 160 Feet Assistive device: Rolling walker (2 wheeled) Gait Pattern/deviations: Step-through pattern;Narrow base of support;Scissoring;Trunk flexed Gait velocity: very slow   General Gait Details: Pt was able to ambulate one lap around RN unit with RW + gait belt+ O2 + IV pole. Pt started ambulation on 3 L but was increased to 5 L to maintain > 90 %. returned to 3 L once returned to bed at conclusion of session.   Stairs             Wheelchair Mobility    Modified Rankin (Stroke Patients Only)       Balance Overall balance assessment: Needs assistance Sitting-balance support: Feet supported Sitting balance-Leahy Scale: Good Sitting balance - Comments: steady sitting balance at edge of bed.   Standing balance support: Bilateral upper extremity supported Standing balance-Leahy Scale: Fair Standing balance comment: in static standing, balance is good however dynamic balace is only fair 2/2 to pt tends to have very narrow BOS and occasional scissoring.                            Cognition Arousal/Alertness: Awake/alert Behavior During Therapy: WFL for tasks assessed/performed Overall Cognitive Status: Within Functional Limits for tasks assessed  General Comments: Pt is A and O x 4. does like things to be neat and has a particular way of doing blankets.       Exercises      General Comments        Pertinent Vitals/Pain Pain Assessment: No/denies pain Pain Score: 0-No pain Faces Pain Scale: No hurt Pain Intervention(s): Limited activity within patient's tolerance;Monitored during session    Home Living                      Prior Function            PT Goals (current goals can now be found in the care plan section) Acute Rehab PT Goals Patient Stated Goal: I want to get strronger Progress towards PT goals: Progressing toward goals    Frequency    Min  2X/week      PT Plan Current plan remains appropriate    Co-evaluation              AM-PAC PT "6 Clicks" Mobility   Outcome Measure  Help needed turning from your back to your side while in a flat bed without using bedrails?: None Help needed moving from lying on your back to sitting on the side of a flat bed without using bedrails?: None Help needed moving to and from a bed to a chair (including a wheelchair)?: A Little Help needed standing up from a chair using your arms (e.g., wheelchair or bedside chair)?: A Little Help needed to walk in hospital room?: A Little Help needed climbing 3-5 steps with a railing? : A Little 6 Click Score: 20    End of Session Equipment Utilized During Treatment: Oxygen;Gait belt Activity Tolerance: Patient tolerated treatment well Patient left: in bed;with call bell/phone within reach;with bed alarm set Nurse Communication: Mobility status PT Visit Diagnosis: Difficulty in walking, not elsewhere classified (R26.2);Muscle weakness (generalized) (M62.81);Unsteadiness on feet (R26.81)     Time: 8937-3428 PT Time Calculation (min) (ACUTE ONLY): 24 min  Charges:  $Gait Training: 8-22 mins $Therapeutic Activity: 8-22 mins                     Julaine Fusi PTA 07/12/19, 1:47 PM

## 2019-07-12 NOTE — Progress Notes (Signed)
PROGRESS NOTE    Corey Carr  VFI:[id number] DOB: 1940/12/26 DOA: 07/08/2019 PCP: [name], MD   Brief Narrative:  Corey Kropp Rogersis a 79 y.o.malewith medical history significant oflung cancer, atrial flutter, paroxysmal A. fib, chronic respiratory failure on 3 L of oxygen, remote history of COVID-19 pneumonia back in December, prior history of hypoglycemia hypertension among other things who came into the ER today with significant shortness of breath from home. He has felt progressively more short of breath lately. Mostly worsened with exertion. Denied any sick contact. Denied any fever or chills but he has had cough. Patient was recently diagnosed with lung cancer and had a PET scan. He has COPD but does not feel like he is wheezing. He did have pneumomediastinum the last time he was in the hospital. He appears to current have pneumomediastinum which has slightly worsened. He had bronchoscopy at the time with biopsy. Patient is generally weak and debilitated. He has failed to thrive essentially not eating or drinking adequately. At this point he is being admitted to the hospital for evaluation. He has been staying home instead of skilled facility with his wife. But he appears to be failing to thrive at home.  ED Course:Temperature 97.7 blood pressure 142/73 pulse 88 respirate 22 oxygen sat 99% on 3 L. White count is 16.8, hemoglobin 10.2 and platelets 655. Sodium 136 potassium 4.2 chloride 101 CO2 21 BUN 26 creatinine 1.18 calcium 9.2 and procalcitonin 0.1. Lactic acid 3.6. COVID-19 testing is negative. Urinalysis essentially negative. CT angiogram of the chest showed stable pulmonary arteries with continuous masslike opacification of the left lower lung with worsening chronic elevation of the left hemidiaphragm. Also increase pneumomediastinum with subcutaneous gas tracking into the lower neck. Head CT without contrast showed no hemorrhage or chronic ischemic  microangiopathy only patient being admitted to the hospital to evaluate and treat his weakness.   Assessment & Plan:   Principal Problem:   Generalized weakness Active Problems:   Failure to thrive (0-17)   Squamous cell carcinoma of left lung (HCC)   Essential hypertension   Dehydration   Chronic obstructive pulmonary disease (HCC)   AF (paroxysmal atrial fibrillation) (HCC)   Palliative care encounter   # Acute on chronic hypoxic respiratory failure, presented with shortness of breath:Agree, likely secondary to patient's lung pathology with the pneumomediastinum, although infection likely contributing. Continue chronic O2 support Continue home regimen including inhalers.  CTA chest: No evidence of pneumonia or PE.  2/18 CXR: Similar appearance of pneumomediastinum as compared to chest radiograph 06/25/2019. However, progression of pneumomediastinum was noted on prior CT 07/08/2019 Pulmonary consulted, recommended antibiotics for possible community-acquired pneumonia started ceftriaxone and doxycycline.   Day #2  # Pneumomediastinum secondary to bolus COPD and obstruction due to left lower lobe mass Pulmonology recommended no intervention, continue gentle spirometry and chest percussion Avoid positive pressure ventilation  # Severe protein calorie malnutrition, decreased appetite secondary to underlying malignancy related cachexia.  Continue Megace, patient is cachectic  # Hypophosphatemia secondary to poor oral intake.  Phosphorus repleted IV.  Continue to monitor and replete as needed.  Recheck daily Continue Megace  # Hypomagnesemia, magnesium repleted IV.  Continue to monitor.  # Anemia of chronic disease, Hb 7.6>7.5>7.6, stable Monitor H&H, no signs of bleeding.  # Generalized weakness:Multifactorial. Patient is slowly failing to thrive at home.  Continue supportive care, fall precautions. PT eval done, recommended home health PT, supervision for  mobility/OOB, supervision intermittent  # dehydration: Continue maintenance IV fluids NS  50 mL/h, continue to encourage oral intake.    # essential hypertension:Continue blood pressure control using home regimen.  # advanced COPD:On home O2. Continue  # squamous cell carcinoma of the left lung:Continue treatment by oncology. Oncology plan to start immunotherapy when patient will be stable, not a candidate for chemotherapy due to recurrent hospitalization secondary to respiratory failure.  # paroxysmal atrial fibrillation:Currently in sinus rhythm. Continue monitoring Continued amiodarone.  Patient is not on NOAC, follow with cardiology as an outpatient  # Constipation for 2 weeks, started MiraLAX daily, Dulcolax twice daily and Dulcolax suppository once 2/18.  No BM yet, 2/19 ordered mineral oil enema today Follow BM  Body mass index is 14.41 kg/m.  Severe protein calorie malnutrition secondary to underlying malignancy.  Nutrition is consulted, Interventions: Started oral supplement  DVT prophylaxis: Heparin SQ  Code Status: DNR    Code Status Orders  (From admission, onward)         Start     Ordered   07/08/19 2130  Do not attempt resuscitation (DNR)  Continuous    Question Answer Comment  In the event of cardiac or respiratory ARREST Do not call a "code blue"   In the event of cardiac or respiratory ARREST Do not perform Intubation, CPR, defibrillation or ACLS   In the event of cardiac or respiratory ARREST Use medication by any route, position, wound care, and other measures to relive pain and suffering. May use oxygen, suction and manual treatment of airway obstruction as needed for comfort.      07/08/19 2130        Code Status History    Date Active Date Inactive Code Status Order ID Comments User Context   06/27/2019 0827 06/30/2019 2131 DNR [id number]  Domenic Polite, MD Inpatient   06/25/2019 2208 06/27/2019 0826 Full Code [id number]  Toy Baker,  MD ED   06/18/2019 0011 06/19/2019 2110 DNR [id number]  Athena Masse, MD ED   06/17/2019 2244 06/18/2019 0011 Full Code [id number]  Athena Masse, MD ED   05/23/2019 1509 05/24/2019 1703 Full Code [id number]  Edwin Dada, MD ED   05/08/2019 2323 05/12/2019 2244 Full Code [id number]  Mansy, Arvella Merles, MD ED   Advance Care Planning Activity     Family Communication: None today Disposition Plan: Patient remained inpatient requiring continued respiratory support, respiratory intervention,.  Consults called: None Admission status: Inpatient   Consultants:   pulm  Procedures:  DG Chest 1 View  Result Date: 07/10/2019 CLINICAL DATA:  Pneumomediastinum. Additional history provided: History of hypertension. Former smoker. EXAM: CHEST  1 VIEW COMPARISON:  CT angiogram chest 07/08/2019, chest radiograph 06/25/2019 FINDINGS: The cardiomediastinal silhouette is unchanged, although largely obscured. Persistent curvilinear opacity projecting along the lateral aspect of the descending thoracic aorta consistent with previously demonstrated pneumomediastinum. Unchanged masslike consolidation within the left lower lung with chronic elevation of the left hemidiaphragm. A left pleural effusion is difficult to exclude. Redemonstrated background emphysematous change with biapical pleuroparenchymal scarring. Chronically increased interstitial markings within the aerated portions of the left lung. A right lower lobe pulmonary nodule was better appreciated on recent prior CT. No evidence of pneumothorax. No acute bony abnormality. IMPRESSION: 1. Similar appearance of pneumomediastinum as compared to chest radiograph 06/25/2019. However, progression of pneumomediastinum was noted on prior CT 07/08/2019. 2. Unchanged masslike consolidation within the left lower lung with chronic elevation of the left hemidiaphragm. 3. Redemonstrated background bullous emphysema with biapical pleuroparenchymal scarring. 4. A right lower  lobe pulmonary  nodule was better appreciated on recent prior CT. Electronically Signed   By: Kellie Simmering DO   On: 07/10/2019 11:21   DG Chest 2 View  Result Date: 06/17/2019 CLINICAL DATA:  Dizziness and shortness of breath. EXAM: CHEST - 2 VIEW COMPARISON:  05/23/2019. FINDINGS: Similar appearance of volume loss in the left hemithorax with elevation of the left hemidiaphragm and left base collapse/consolidative opacity. There is diffuse interstitial and pleural opacity in the left chest. Right lung is hyperexpanded without focal consolidation or pleural effusion. Bones are diffusely demineralized. IMPRESSION: Largely stable exam. Marked volume loss left hemithorax with posterior collapse/consolidative change in diffuse pleural thickening. Electronically Signed   By: Misty Stanley M.D.   On: 06/17/2019 16:43   CT Head Wo Contrast  Result Date: 07/08/2019 CLINICAL DATA:  Lung cancer. Assessment for hemorrhage in the setting of possible need for anticoagulation. EXAM: CT HEAD WITHOUT CONTRAST TECHNIQUE: Contiguous axial images were obtained from the base of the skull through the vertex without intravenous contrast. COMPARISON:  None. FINDINGS: Brain: There is no mass, hemorrhage or extra-axial collection. There is generalized atrophy without lobar predilection. Hypodensity of the white matter is most commonly associated with chronic microvascular disease. Vascular: No abnormal hyperdensity of the major intracranial arteries or dural venous sinuses. No intracranial atherosclerosis. Skull: The visualized skull base, calvarium and extracranial soft tissues are normal. Sinuses/Orbits: No fluid levels or advanced mucosal thickening of the visualized paranasal sinuses. No mastoid or middle ear effusion. The orbits are normal. IMPRESSION: 1. No hemorrhage or other acute abnormality. 2. Chronic ischemic microangiopathy and generalized volume loss. Electronically Signed   By: Ulyses Jarred M.D.   On: 07/08/2019 19:37     CT Angio Chest PE W and/or Wo Contrast  Result Date: 07/08/2019 CLINICAL DATA:  79 year old male with shortness of breath, chronic oxygen. Currently being evaluated for possible left lower lobe mass. EXAM: CT ANGIOGRAPHY CHEST WITH CONTRAST TECHNIQUE: Multidetector CT imaging of the chest was performed using the standard protocol during bolus administration of intravenous contrast. Multiplanar CT image reconstructions and MIPs were obtained to evaluate the vascular anatomy. CONTRAST:  36mL OMNIPAQUE IOHEXOL 350 MG/ML SOLN COMPARISON:  CTA chest and CT Abdomen and Pelvis 06/17/2019. FINDINGS: Cardiovascular: Good contrast bolus timing in the pulmonary arterial tree. Pulmonary artery patency in the left lung appears stable since last month, with obliterated left lower lobe pulmonary arteries. No central or hilar pulmonary embolus. The right lung pulmonary arteries appear patent and within normal limits. No aortic contrast today. Calcified aortic atherosclerosis. The heart remain shifted over to the left. No cardiomegaly or pericardial effusion is evident. Mediastinum/Nodes: Increased pneumomediastinum since 06/17/2019, gas now tracking into the bilateral lower neck and also toward the core of the diaphragm. The left hilum is indistinct as before, but elsewhere no mediastinal or right hilar lymphadenopathy is identified. Lungs/Pleura: The left lower lobe bronchus more abruptly terminates now on series 7, image 44 and there is no residual more distal left lower lobe airway pneumatization as there was last month. Complete masslike opacification of the left lower lobe otherwise appears stable. Superimposed bullous emphysema, left upper lobe and right lung base predominant scarring. An indeterminate superior segment right lower lobe 8-9 mm oval nodule is stable from last month, new since 03/05/2020. No pleural effusion or new right lung opacity. Upper Abdomen: Elevation of the left hemidiaphragm again suspected. The  stomach and splenic flexure are visible on series 5, image 75. There is stool mixed with oral contrast or other  dense material in the colon. Evidence of chronic calcific pancreatitis. Grossly negative visible noncontrast liver, kidneys and adrenal glands. Musculoskeletal: Osteopenia. No acute or suspicious osseous lesion identified. Review of the MIP images confirms the above findings. IMPRESSION: 1. Stable pulmonary arteries, obliterated in the left lower lung but with no evidence of acute PE. 2. Continued mass-like opacification of the left lower lung with worsening abrupt termination of the lower lobe bronchus again highly suspicious for tumor. There is superimposed chronic elevation of the left hemidiaphragm. There is increased pneumomediastinum, now with subcutaneous gas tracking into the lower neck. It appears a PET-CT was scheduled for 06/25/2019 but did not occur. 3. Underlying bullous Emphysema (ICD10-J43.9). Aortic Atherosclerosis (ICD10-I70.0). Electronically Signed   By: Genevie Ann M.D.   On: 07/08/2019 19:36   CT Angio Chest PE W/Cm &/Or Wo Cm  Result Date: 06/17/2019 CLINICAL DATA:  Shortness of breath EXAM: CT ANGIOGRAPHY CHEST WITH CONTRAST TECHNIQUE: Multidetector CT imaging of the chest was performed using the standard protocol during bolus administration of intravenous contrast. Multiplanar CT image reconstructions and MIPs were obtained to evaluate the vascular anatomy. CONTRAST:  46mL OMNIPAQUE IOHEXOL 350 MG/ML SOLN COMPARISON:  03/06/2019 FINDINGS: Cardiovascular: No filling defects in the pulmonary arteries to suggest pulmonary emboli. Heart is normal size. Aorta normal caliber. Aortic and coronary artery atherosclerosis. Mediastinum/Nodes: There is pneumomediastinum. Gas seen around the main pulmonary artery and in the posterior mediastinum. No adenopathy. Lungs/Pleura: Volume loss on the left with elevation of the left hemidiaphragm. Findings are chronic. There is a chronic masslike area  of consolidation in the left lower lobe which is similar to prior study. Advanced centrilobular emphysema and areas of scarring in both lungs. Possible small left effusion, stable. Upper Abdomen: Imaging into the upper abdomen shows no acute findings. Musculoskeletal: Cachexia.  No acute bony abnormality. Review of the MIP images confirms the above findings. IMPRESSION: Pneumomediastinum seen in the middle and posterior mediastinum. No associated pneumothorax. Advanced centrilobular emphysema. Stable chronic volume loss on the left with elevation of the left hemidiaphragm and masslike consolidation in the left lower lobe. As recommended on prior study, bronchoscopy with attention to the left lower lobe and/or PET CT may be helpful to exclude left lower lobe mass lesion. Small right pleural effusion. Areas of scarring in the lungs bilaterally. Aortic Atherosclerosis (ICD10-I70.0) and Emphysema (ICD10-J43.9). Electronically Signed   By: Rolm Baptise M.D.   On: 06/17/2019 21:12   CT Abdomen Pelvis W Contrast  Result Date: 06/17/2019 CLINICAL DATA:  Hypercalcemia. EXAM: CT ABDOMEN AND PELVIS WITH CONTRAST TECHNIQUE: Multidetector CT imaging of the abdomen and pelvis was performed using the standard protocol following bolus administration of intravenous contrast. CONTRAST:  20mL OMNIPAQUE IOHEXOL 350 MG/ML SOLN COMPARISON:  None. FINDINGS: Lower chest: Masslike opacity seen in the left lower lobe as described on prior chest CT. Trace left pleural effusion. Volume loss on the left with elevation of the left hemidiaphragm. Advanced emphysema. Scarring in the right lower lobe. Hepatobiliary: No focal hepatic abnormality. Gallbladder unremarkable. Pancreas: Calcifications throughout the pancreas compatible with chronic pancreatitis. No evidence of acute pancreatitis, fall suspicious pancreatic lesion or pancreatic ductal dilatation. Spleen: No focal abnormality.  Normal size. Adrenals/Urinary Tract: Small cysts in the  left kidney. No hydronephrosis. Adrenal glands and urinary bladder unremarkable. Stomach/Bowel: Large stool burden throughout the colon. Distention of the rectum with stool concerning for fecal impaction. Stomach and small bowel decompressed, unremarkable. Vascular/Lymphatic: Aortic atherosclerosis. No enlarged abdominal or pelvic lymph nodes. Reproductive: Prostate prominence. Other:  No free fluid or free air. Musculoskeletal: Cachexia. No acute bony abnormality. Degenerative disc and facet disease in the lower lumbar spine. IMPRESSION: Masslike opacity in the left lower lobe as described on chest CT concerning for possible pulmonary mass lesions/malignancy. This could be further evaluated with bronchoscopy and/or PET CT as described on prior chest CT. Advanced emphysema. Large stool burden throughout the colon. Appearance is concerning for fecal impaction. Chronic pancreatitis changes.  No evidence of acute pancreatitis. Aortic atherosclerosis. Electronically Signed   By: Rolm Baptise M.D.   On: 06/17/2019 21:16   DG Chest Port 1 View  Result Date: 07/12/2019 CLINICAL DATA:  Dyspnea EXAM: PORTABLE CHEST 1 VIEW COMPARISON:  Two days ago FINDINGS: Retrocardiac opacity with volume loss, masslike appearance by CT and reportedly squamous cell carcinoma. Emphysema with hyperinflation of the right lung where there is interstitial coarsening. No visible residual pneumomediastinum. No pneumothorax is noted. IMPRESSION: 1. No progression from 2 days ago. Pneumo mediastinum is no longer clearly seen. 2. Left lower lobe mass and volume loss. 3. Emphysema. Electronically Signed   By: Monte Fantasia M.D.   On: 07/12/2019 09:06   DG Chest Port 1 View  Result Date: 06/25/2019 CLINICAL DATA:  Weakness, COVID-19 positive EXAM: PORTABLE CHEST 1 VIEW COMPARISON:  06/17/2019 FINDINGS: Chronic volume loss with consolidation in the left hemithorax, stable from prior. There may be mildly increased opacity within the aerated  portion of the left lung. Heart size is stable. Mediastinal structures remain shifted to the left. There is hyperexpansion of the right lung field with chronic emphysematous changes and right apical scarring. No pneumothorax. IMPRESSION: Chronic volume loss with consolidation in the left hemithorax, stable from prior. There may be mildly increased opacity within the aerated portion of the left lung, which could reflect pneumonia given the patient's history. Electronically Signed   By: Davina Poke D.O.   On: 06/25/2019 13:31   DG C-Arm 1-60 Min-No Report  Result Date: 06/30/2019 Fluoroscopy was utilized by the requesting physician.  No radiographic interpretation.   ECHOCARDIOGRAM COMPLETE  Result Date: 06/26/2019   ECHOCARDIOGRAM REPORT   Patient Name:   Corey Carr Date of Exam: 06/26/2019 Medical Rec #:  [id number]       Height:       68.0 in Accession #:    [id number]      Weight:       92.2 lb Date of Birth:  [birth year]       BSA:          1.47 m Patient Age:    48 years        BP:           89/66 mmHg Patient Gender: M               HR:           129 bpm. Exam Location:  [facility] Procedure: 2D Echo Indications:     ATRIAL FIBRILLATION 427.31/I48.91  History:         Patient has no prior history of Echocardiogram examinations.                  COPD; Risk Factors:Hypertension.  Sonographer:     Avanell Shackleton Referring Phys:  [place] Diagnosing Phys: Ida Rogue MD  Sonographer Comments: Image acquisition challenging due to patient body habitus. IMPRESSIONS  1. Left ventricular ejection fraction, by visual estimation, is 50 to 55%. The left ventricle has normal function. There is moderately increased  left ventricular hypertrophy.  2. Left ventricular diastolic parameters are indeterminate.  3. The left ventricle has no regional wall motion abnormalities.  4. Global right ventricle has normal systolic function.The right ventricular size is normal. No increase in right ventricular wall  thickness.  5. Left atrial size was normal.  6. Tricuspid valve regurgitation is mild-moderate.  7. Mildly elevated pulmonary artery systolic pressure.  8. Tachycardia noted/atrial flutter, rate 130 bpm FINDINGS  Left Ventricle: Left ventricular ejection fraction, by visual estimation, is 50 to 55%. The left ventricle has normal function. The left ventricle has no regional wall motion abnormalities. There is moderately increased left ventricular hypertrophy. Left ventricular diastolic parameters are indeterminate. Normal left atrial pressure. Right Ventricle: The right ventricular size is normal. No increase in right ventricular wall thickness. Global RV systolic function is has normal systolic function. The tricuspid regurgitant velocity is 2.90 m/s, and with an assumed right atrial pressure  of 5 mmHg, the estimated right ventricular systolic pressure is mildly elevated at 38.6 mmHg. Left Atrium: Left atrial size was normal in size. Right Atrium: Right atrial size was normal in size Pericardium: There is no evidence of pericardial effusion. Mitral Valve: The mitral valve is normal in structure. Mild to moderate mitral valve regurgitation. No evidence of mitral valve stenosis by observation. Tricuspid Valve: The tricuspid valve is normal in structure. Tricuspid valve regurgitation is mild-moderate. Aortic Valve: The aortic valve is normal in structure. Aortic valve regurgitation is not visualized. Mild to moderate aortic valve sclerosis/calcification is present, without any evidence of aortic stenosis. Pulmonic Valve: The pulmonic valve was normal in structure. Pulmonic valve regurgitation is not visualized. Pulmonic regurgitation is not visualized. Aorta: The aortic root, ascending aorta and aortic arch are all structurally normal, with no evidence of dilitation or obstruction. Venous: The inferior vena cava is normal in size with greater than 50% respiratory variability, suggesting right atrial pressure of 3  mmHg. IAS/Shunts: No atrial level shunt detected by color flow Doppler. There is no evidence of a patent foramen ovale. No ventricular septal defect is seen or detected. There is no evidence of an atrial septal defect.  LEFT VENTRICLE PLAX 2D LVIDd:         2.60 cm LVIDs:         2.26 cm LV PW:         0.68 cm LV IVS:        0.92 cm LVOT diam:     1.70 cm LV SV:         7 ml LV SV Index:   5.30 LVOT Area:     2.27 cm  LEFT ATRIUM             Index       RIGHT ATRIUM           Index LA diam:        2.70 cm 1.84 cm/m  RA Area:     16.00 cm LA Vol (A2C):   58.4 ml 39.72 ml/m RA Volume:   45.70 ml  31.08 ml/m LA Vol (A4C):   33.6 ml 22.85 ml/m LA Biplane Vol: 48.1 ml 32.71 ml/m   AORTA Ao Root diam: 3.30 cm TRICUSPID VALVE TR Peak grad:   33.6 mmHg TR Vmax:        305.00 cm/s  SHUNTS Systemic Diam: 1.70 cm  Ida Rogue MD Electronically signed by Ida Rogue MD Signature Date/Time: 06/26/2019/7:28:13 PM    Final    [place] EKG SITE RITE  Result Date: 06/26/2019 If [facility] image not attached, placement could not be confirmed due to current cardiac rhythm.    Antimicrobials:  Ceftriaxone and doxycycline day #2  Subjective: Patient reports still with shortness of breath and significant weakness Still with significant oral secretions  Objective: Vitals:   07/11/19 0643 07/11/19 1500 07/11/19 2002 07/12/19 0533  BP:  130/68 (!) 143/77 (!) 149/83  Pulse:  67 68 70  Resp:  20 18 18   Temp:  98.2 F (36.8 C) 98.4 F (36.9 C) 97.7 F (36.5 C)  TempSrc:  Oral Oral Oral  SpO2: 100% 100% 100% 100%  Weight:      Height:        Intake/Output Summary (Last 24 hours) at 07/12/2019 1059 Last data filed at 07/12/2019 0932 Gross per 24 hour  Intake 1636.04 ml  Output 2650 ml  Net -1013.96 ml   Filed Weights   07/08/19 1742  Weight: 43 kg    Examination:  General exam: Appears calm and comfortable  Respiratory system: Trace wheezing, rhonchi bilaterally, decreased breath sounds  bilaterally Cardiovascular system: S1 & S2 heard, RRR. No JVD, murmurs, rubs, gallops or clicks. No pedal edema. Gastrointestinal system: Abdomen is nondistended, soft and nontender. No organomegaly or masses felt. Normal bowel sounds heard. Central nervous system: Alert and oriented.  Globally weak. Extremities: Thin cachectic although neurovascularly intact Skin: No rashes, lesions or ulcers Psychiatry: Judgement and insight appear normal. Mood & affect appropriate.     Data Reviewed: I have personally reviewed following labs and imaging studies  CBC: Recent Labs  Lab 07/08/19 1811 07/09/19 0629 07/10/19 0501 07/11/19 0316 07/12/19 0636  WBC 16.8* 21.8* 19.1* 19.1* 20.0*  NEUTROABS 15.5*  --   --   --   --   HGB 10.2* 8.3* 7.6* 7.5* 7.6*  HCT 31.5* 25.1* 22.5* 22.3* 22.8*  MCV 84.7 81.8 83.0 82.9 82.6  PLT 655* 513* 458* 455* 161*   Basic Metabolic Panel: Recent Labs  Lab 07/08/19 1811 07/09/19 0629 07/10/19 0501 07/11/19 0316 07/12/19 0636  NA 136 137 137 138 137  K 4.4 4.2 3.8 4.0 4.0  CL 101 107 109 110 109  CO2 21* 24 24 23 22   GLUCOSE 140* 111* 101* 113* 77  BUN 26* 18 15 14 16   CREATININE 1.18 0.83 0.72 0.67 0.70  CALCIUM 9.2 8.4* 8.2* 8.1* 8.1*  MG  --   --  1.6* 2.1 1.9  PHOS  --   --  1.4* 2.0* 2.5   GFR: Estimated Creatinine Clearance: 46.3 mL/min (by C-G formula based on SCr of 0.7 mg/dL). Liver Function Tests: Recent Labs  Lab 07/08/19 1811 07/09/19 0629  AST 19 15  ALT 35 27  ALKPHOS 63 48  BILITOT 0.9 1.0  PROT 6.7 5.4*  ALBUMIN 3.2* 2.6*   No results for input(s): LIPASE, AMYLASE in the last 168 hours. No results for input(s): AMMONIA in the last 168 hours. Coagulation Profile: No results for input(s): INR, PROTIME in the last 168 hours. Cardiac Enzymes: No results for input(s): CKTOTAL, CKMB, CKMBINDEX, TROPONINI in the last 168 hours. BNP (last 3 results) No results for input(s): PROBNP in the last 8760 hours. HbA1C: No results  for input(s): HGBA1C in the last 72 hours. CBG: No results for input(s): GLUCAP in the last 168 hours. Lipid Profile: No results for input(s): CHOL, HDL, LDLCALC, TRIG, CHOLHDL, LDLDIRECT in the last 72 hours. Thyroid Function Tests: No results for input(s): TSH, T4TOTAL, FREET4, T3FREE, THYROIDAB in the  last 72 hours. Anemia Panel: No results for input(s): VITAMINB12, FOLATE, FERRITIN, TIBC, IRON, RETICCTPCT in the last 72 hours. Sepsis Labs: Recent Labs  Lab 07/08/19 1811 07/08/19 1812 07/08/19 1943 07/09/19 0629 07/10/19 0501  PROCALCITON 0.10  --   --  <0.10 <0.10  LATICACIDVEN  --  3.1* 3.6*  --   --     Recent Results (from the past 240 hour(s))  Respiratory Panel by RT PCR (Flu A&B, Covid) - Nasopharyngeal Swab     Status: None   Collection Time: 07/08/19  7:43 PM   Specimen: Nasopharyngeal Swab  Result Value Ref Range Status   SARS Coronavirus 2 by RT PCR NEGATIVE NEGATIVE Final    Comment: (NOTE) SARS-CoV-2 target nucleic acids are NOT DETECTED. The SARS-CoV-2 RNA is generally detectable in upper respiratoy specimens during the acute phase of infection. The lowest concentration of SARS-CoV-2 viral copies this assay can detect is 131 copies/mL. A negative result does not preclude SARS-Cov-2 infection and should not be used as the sole basis for treatment or other patient management decisions. A negative result may occur with  improper specimen collection/handling, submission of specimen other than nasopharyngeal swab, presence of viral mutation(s) within the areas targeted by this assay, and inadequate number of viral copies (<131 copies/mL). A negative result must be combined with clinical observations, patient history, and epidemiological information. The expected result is Negative. Fact Sheet for Patients:  PinkCheek.be Fact Sheet for Healthcare Providers:  GravelBags.it This test is not yet ap proved or  cleared by the [place] FDA and  has been authorized for detection and/or diagnosis of SARS-CoV-2 by FDA under an Emergency Use Authorization (EUA). This EUA will remain  in effect (meaning this test can be used) for the duration of the COVID-19 declaration under Section 564(b)(1) of the Act, 21 U.S.C. section 360bbb-3(b)(1), unless the authorization is terminated or revoked sooner.    Influenza A by PCR NEGATIVE NEGATIVE Final   Influenza B by PCR NEGATIVE NEGATIVE Final    Comment: (NOTE) The Xpert Xpress SARS-CoV-2/FLU/RSV assay is intended as an aid in  the diagnosis of influenza from Nasopharyngeal swab specimens and  should not be used as a sole basis for treatment. Nasal washings and  aspirates are unacceptable for Xpert Xpress SARS-CoV-2/FLU/RSV  testing. Fact Sheet for Patients: PinkCheek.be Fact Sheet for Healthcare Providers: GravelBags.it This test is not yet approved or cleared by the [place] FDA and  has been authorized for detection and/or diagnosis of SARS-CoV-2 by  FDA under an Emergency Use Authorization (EUA). This EUA will remain  in effect (meaning this test can be used) for the duration of the  Covid-19 declaration under Section 564(b)(1) of the Act, 21  U.S.C. section 360bbb-3(b)(1), unless the authorization is  terminated or revoked. Performed at [facility], [address], [place], [place] [address]          Radiology Studies: DG Chest [place] 1 View  Result Date: 07/12/2019 CLINICAL DATA:  Dyspnea EXAM: PORTABLE CHEST 1 VIEW COMPARISON:  Two days ago FINDINGS: Retrocardiac opacity with volume loss, masslike appearance by CT and reportedly squamous cell carcinoma. Emphysema with hyperinflation of the right lung where there is interstitial coarsening. No visible residual pneumomediastinum. No pneumothorax is noted. IMPRESSION: 1. No progression from 2 days ago. Pneumo  mediastinum is no longer clearly seen. 2. Left lower lobe mass and volume loss. 3. Emphysema. Electronically Signed   By: Monte Fantasia M.D.   On: 07/12/2019 09:06  Scheduled Meds: . albuterol  3 mL Inhalation See admin instructions  . amiodarone  200 mg Oral BID  . vitamin C  500 mg Oral Daily  . aspirin EC  325 mg Oral Daily  . bisacodyl  10 mg Oral BID  . bisacodyl  10 mg Rectal Once  . dexamethasone  4 mg Oral Daily  . feeding supplement (ENSURE ENLIVE)  237 mL Oral TID BM  . fluticasone furoate-vilanterol  1 puff Inhalation Daily   And  . umeclidinium bromide  1 puff Inhalation Daily  . heparin  5,000 Units Subcutaneous Q8H  . loratadine  10 mg Oral Daily  . megestrol  400 mg Oral Daily  . multivitamin with minerals  1 tablet Oral Daily  . omega-3 acid ethyl esters  1 g Oral Daily  . pantoprazole  40 mg Oral Daily  . polyethylene glycol  17 g Oral BID  . vitamin B-12  500 mcg Oral Daily  . vitamin E  400 Units Oral Daily   Continuous Infusions: . sodium chloride 50 mL/hr at 07/12/19 0855  . sodium chloride 250 mL (07/12/19 1045)  . cefTRIAXone (ROCEPHIN)  IV 1 g (07/12/19 1046)  . doxycycline (VIBRAMYCIN) IV 100 mg (07/12/19 0925)     LOS: 4 days    Time spent: 51 min    Nicolette Bang, MD [facility]  If 7PM-7AM, please contact night-coverage  07/12/2019, 10:59 AM

## 2019-07-13 NOTE — Progress Notes (Signed)
Attempted to get patient up to walk a couple times this afternoon but he refused and said he would just wait on PT tomorrow.  I also tried to turn the patient to relieve some pressure from his bottom but he refused that as well, said it "was very uncomfortable".

## 2019-07-13 NOTE — Progress Notes (Signed)
PROGRESS NOTE    Corey Carr  FTD:[id number] DOB: February 09, 1941 DOA: 07/08/2019 PCP: [name], MD   Brief Narrative:  Corey Worley Rogersis a 79 y.o.malewith medical history significant oflung cancer, atrial flutter, paroxysmal A. fib, chronic respiratory failure on 3 L of oxygen, remote history of COVID-19 pneumonia back in December, prior history of hypoglycemia hypertension among other things who came into the ER today with significant shortness of breath from home. He has felt progressively more short of breath lately. Mostly worsened with exertion. Denied any sick contact. Denied any fever or chills but he has had cough. Patient was recently diagnosed with lung cancer and had a PET scan. He has COPD but does not feel like he is wheezing. He did have pneumomediastinum the last time he was in the hospital. He appears to current have pneumomediastinum which has slightly worsened. He had bronchoscopy at the time with biopsy. Patient is generally weak and debilitated. He has failed to thrive essentially not eating or drinking adequately. At this point he is being admitted to the hospital for evaluation. He has been staying home instead of skilled facility with his wife. But he appears to be failing to thrive at home.  ED Course:Temperature 97.7 blood pressure 142/73 pulse 88 respirate 22 oxygen sat 99% on 3 L. White count is 16.8, hemoglobin 10.2 and platelets 655. Sodium 136 potassium 4.2 chloride 101 CO2 21 BUN 26 creatinine 1.18 calcium 9.2 and procalcitonin 0.1. Lactic acid 3.6. COVID-19 testing is negative. Urinalysis essentially negative. CT angiogram of the chest showed stable pulmonary arteries with continuous masslike opacification of the left lower lung with worsening chronic elevation of the left hemidiaphragm. Also increase pneumomediastinum with subcutaneous gas tracking into the lower neck. Head CT without contrast showed no hemorrhage or chronic ischemic  microangiopathy only patient being admitted to the hospital to evaluate and treat his weakness   Assessment & Plan:   Principal Problem:   Generalized weakness Active Problems:   Failure to thrive (0-17)   Squamous cell carcinoma of left lung (HCC)   Essential hypertension   Dehydration   Chronic obstructive pulmonary disease (HCC)   AF (paroxysmal atrial fibrillation) (HCC)   Palliative care encounter   # Acute on chronic hypoxic respiratory failure, presented with shortness of breath:Agree, likely secondary to patient's lung pathology with the pneumomediastinum, although infection likely contributing. Continue chronic O2 support Continue home regimen including inhalers.  CTA chest: No evidence of pneumonia or PE.  2/18 CXR: Similar appearance of pneumomediastinum as compared to chest radiograph 06/25/2019 Repeat chest x-ray However, progression of pneumomediastinum was noted on prior CT 07/08/2019 Pulmonary consulted,recommended antibiotics for possible community-acquired pneumonia started ceftriaxone and doxycycline.  Day #3  #Pneumomediastinumsecondary to bolus COPD and obstruction due to left lower lobe mass Pulmonology recommended no intervention, continue gentle spirometry and chest percussion Avoid positive pressure ventilation  #Severe protein calorie malnutrition,decreased appetite secondary to underlying malignancy related cachexia.Continue Megace, patient is cachectic  # Hypophosphatemiasecondary to poor oral intake. Phosphorus repleted IV. Continue to monitor and replete as needed.  Recheck daily Continue Megace  # Hypomagnesemia,magnesium repleted IV. Continue to monitor.  # Anemia of chronic disease,Hb 7.6>7.5>7.6, stable Monitor H&H, no signs of bleeding.  #Generalized weakness:Multifactorial. Patient is slowly failing to thrive at home.  Continue supportive care, fall precautions. PT eval Saturday with recommendations for  skilled nursing facility  # dehydration:Continue maintenance IV fluids NS 50 mL/h,continue to encourage oral intake.   # essential hypertension:Continue blood pressure control using home  regimen.  # advanced COPD:On home O2. Continue  # squamous cell carcinoma of the left lung:Continue treatment by oncology. Oncology plan to start immunotherapy when patient will be stable, not a candidate for chemotherapy due to recurrent hospitalization secondary to respiratory failure.  # paroxysmal atrial fibrillation:Currently in sinus rhythm. Continue monitoring Continued amiodarone.Patient is not on NOAC,follow with cardiology as an outpatient  # Constipation for 2 weeks, started MiraLAX daily, Dulcolax twice daily and Dulcolax suppository once 2/18.No BM yet, 2/19ordered mineral oil enema today Follow BM  Body mass index is 14.41 kg/m.Severe protein calorie malnutrition secondary to underlying malignancy. Nutrition is consulted, Interventions: Started oral supplement  DVT prophylaxis: Heparin SQ  Code Status: DNR    Code Status Orders  (From admission, onward)         Start     Ordered   07/08/19 2130  Do not attempt resuscitation (DNR)  Continuous    Question Answer Comment  In the event of cardiac or respiratory ARREST Do not call a code blue   In the event of cardiac or respiratory ARREST Do not perform Intubation, CPR, defibrillation or ACLS   In the event of cardiac or respiratory ARREST Use medication by any route, position, wound care, and other measures to relive pain and suffering. May use oxygen, suction and manual treatment of airway obstruction as needed for comfort.      07/08/19 2130        Code Status History    Date Active Date Inactive Code Status Order ID Comments User Context   06/27/2019 0827 06/30/2019 2131 DNR [id number]  Domenic Polite, MD Inpatient   06/25/2019 2208 06/27/2019 0826 Full Code [id number]  Toy Baker, MD ED    06/18/2019 0011 06/19/2019 2110 DNR [id number]  Athena Masse, MD ED   06/17/2019 2244 06/18/2019 0011 Full Code [id number]  Athena Masse, MD ED   05/23/2019 1509 05/24/2019 1703 Full Code [id number]  Edwin Dada, MD ED   05/08/2019 2323 05/12/2019 2244 Full Code [id number]  Mansy, Arvella Merles, MD ED   Advance Care Planning Activity     Family Communication: Tried calling wife, no answer, left message Disposition Plan: Patient still with significant weakness and O2 desaturation with any level of effort.  Working with physical therapy, patient require continued inpatient physical therapy.  Not yet ready for discharge, will continue and possible transition to skilled nursing facility if no improvement per PT recs  Consults called: None Admission status: Inpatient   Consultants:   Pulmonology  Procedures:  DG Chest 1 View  Result Date: 07/10/2019 CLINICAL DATA:  Pneumomediastinum. Additional history provided: History of hypertension. Former smoker. EXAM: CHEST  1 VIEW COMPARISON:  CT angiogram chest 07/08/2019, chest radiograph 06/25/2019 FINDINGS: The cardiomediastinal silhouette is unchanged, although largely obscured. Persistent curvilinear opacity projecting along the lateral aspect of the descending thoracic aorta consistent with previously demonstrated pneumomediastinum. Unchanged masslike consolidation within the left lower lung with chronic elevation of the left hemidiaphragm. A left pleural effusion is difficult to exclude. Redemonstrated background emphysematous change with biapical pleuroparenchymal scarring. Chronically increased interstitial markings within the aerated portions of the left lung. A right lower lobe pulmonary nodule was better appreciated on recent prior CT. No evidence of pneumothorax. No acute bony abnormality. IMPRESSION: 1. Similar appearance of pneumomediastinum as compared to chest radiograph 06/25/2019. However, progression of pneumomediastinum was noted on prior CT  07/08/2019. 2. Unchanged masslike consolidation within the left lower lung with chronic elevation of the left hemidiaphragm. 3.  Redemonstrated background bullous emphysema with biapical pleuroparenchymal scarring. 4. A right lower lobe pulmonary nodule was better appreciated on recent prior CT. Electronically Signed   By: Kellie Simmering DO   On: 07/10/2019 11:21   DG Chest 2 View  Result Date: 06/17/2019 CLINICAL DATA:  Dizziness and shortness of breath. EXAM: CHEST - 2 VIEW COMPARISON:  05/23/2019. FINDINGS: Similar appearance of volume loss in the left hemithorax with elevation of the left hemidiaphragm and left base collapse/consolidative opacity. There is diffuse interstitial and pleural opacity in the left chest. Right lung is hyperexpanded without focal consolidation or pleural effusion. Bones are diffusely demineralized. IMPRESSION: Largely stable exam. Marked volume loss left hemithorax with posterior collapse/consolidative change in diffuse pleural thickening. Electronically Signed   By: Misty Stanley M.D.   On: 06/17/2019 16:43   CT Head Wo Contrast  Result Date: 07/08/2019 CLINICAL DATA:  Lung cancer. Assessment for hemorrhage in the setting of possible need for anticoagulation. EXAM: CT HEAD WITHOUT CONTRAST TECHNIQUE: Contiguous axial images were obtained from the base of the skull through the vertex without intravenous contrast. COMPARISON:  None. FINDINGS: Brain: There is no mass, hemorrhage or extra-axial collection. There is generalized atrophy without lobar predilection. Hypodensity of the white matter is most commonly associated with chronic microvascular disease. Vascular: No abnormal hyperdensity of the major intracranial arteries or dural venous sinuses. No intracranial atherosclerosis. Skull: The visualized skull base, calvarium and extracranial soft tissues are normal. Sinuses/Orbits: No fluid levels or advanced mucosal thickening of the visualized paranasal sinuses. No mastoid or  middle ear effusion. The orbits are normal. IMPRESSION: 1. No hemorrhage or other acute abnormality. 2. Chronic ischemic microangiopathy and generalized volume loss. Electronically Signed   By: Ulyses Jarred M.D.   On: 07/08/2019 19:37   CT Angio Chest PE W and/or Wo Contrast  Result Date: 07/08/2019 CLINICAL DATA:  79 year old male with shortness of breath, chronic oxygen. Currently being evaluated for possible left lower lobe mass. EXAM: CT ANGIOGRAPHY CHEST WITH CONTRAST TECHNIQUE: Multidetector CT imaging of the chest was performed using the standard protocol during bolus administration of intravenous contrast. Multiplanar CT image reconstructions and MIPs were obtained to evaluate the vascular anatomy. CONTRAST:  59mL OMNIPAQUE IOHEXOL 350 MG/ML SOLN COMPARISON:  CTA chest and CT Abdomen and Pelvis 06/17/2019. FINDINGS: Cardiovascular: Good contrast bolus timing in the pulmonary arterial tree. Pulmonary artery patency in the left lung appears stable since last month, with obliterated left lower lobe pulmonary arteries. No central or hilar pulmonary embolus. The right lung pulmonary arteries appear patent and within normal limits. No aortic contrast today. Calcified aortic atherosclerosis. The heart remain shifted over to the left. No cardiomegaly or pericardial effusion is evident. Mediastinum/Nodes: Increased pneumomediastinum since 06/17/2019, gas now tracking into the bilateral lower neck and also toward the core of the diaphragm. The left hilum is indistinct as before, but elsewhere no mediastinal or right hilar lymphadenopathy is identified. Lungs/Pleura: The left lower lobe bronchus more abruptly terminates now on series 7, image 44 and there is no residual more distal left lower lobe airway pneumatization as there was last month. Complete masslike opacification of the left lower lobe otherwise appears stable. Superimposed bullous emphysema, left upper lobe and right lung base predominant scarring. An  indeterminate superior segment right lower lobe 8-9 mm oval nodule is stable from last month, new since 03/05/2020. No pleural effusion or new right lung opacity. Upper Abdomen: Elevation of the left hemidiaphragm again suspected. The stomach and splenic flexure are visible on  series 5, image 75. There is stool mixed with oral contrast or other dense material in the colon. Evidence of chronic calcific pancreatitis. Grossly negative visible noncontrast liver, kidneys and adrenal glands. Musculoskeletal: Osteopenia. No acute or suspicious osseous lesion identified. Review of the MIP images confirms the above findings. IMPRESSION: 1. Stable pulmonary arteries, obliterated in the left lower lung but with no evidence of acute PE. 2. Continued mass-like opacification of the left lower lung with worsening abrupt termination of the lower lobe bronchus again highly suspicious for tumor. There is superimposed chronic elevation of the left hemidiaphragm. There is increased pneumomediastinum, now with subcutaneous gas tracking into the lower neck. It appears a PET-CT was scheduled for 06/25/2019 but did not occur. 3. Underlying bullous Emphysema (ICD10-J43.9). Aortic Atherosclerosis (ICD10-I70.0). Electronically Signed   By: Genevie Ann M.D.   On: 07/08/2019 19:36   CT Angio Chest PE W/Cm &/Or Wo Cm  Result Date: 06/17/2019 CLINICAL DATA:  Shortness of breath EXAM: CT ANGIOGRAPHY CHEST WITH CONTRAST TECHNIQUE: Multidetector CT imaging of the chest was performed using the standard protocol during bolus administration of intravenous contrast. Multiplanar CT image reconstructions and MIPs were obtained to evaluate the vascular anatomy. CONTRAST:  68mL OMNIPAQUE IOHEXOL 350 MG/ML SOLN COMPARISON:  03/06/2019 FINDINGS: Cardiovascular: No filling defects in the pulmonary arteries to suggest pulmonary emboli. Heart is normal size. Aorta normal caliber. Aortic and coronary artery atherosclerosis. Mediastinum/Nodes: There is  pneumomediastinum. Gas seen around the main pulmonary artery and in the posterior mediastinum. No adenopathy. Lungs/Pleura: Volume loss on the left with elevation of the left hemidiaphragm. Findings are chronic. There is a chronic masslike area of consolidation in the left lower lobe which is similar to prior study. Advanced centrilobular emphysema and areas of scarring in both lungs. Possible small left effusion, stable. Upper Abdomen: Imaging into the upper abdomen shows no acute findings. Musculoskeletal: Cachexia.  No acute bony abnormality. Review of the MIP images confirms the above findings. IMPRESSION: Pneumomediastinum seen in the middle and posterior mediastinum. No associated pneumothorax. Advanced centrilobular emphysema. Stable chronic volume loss on the left with elevation of the left hemidiaphragm and masslike consolidation in the left lower lobe. As recommended on prior study, bronchoscopy with attention to the left lower lobe and/or PET CT may be helpful to exclude left lower lobe mass lesion. Small right pleural effusion. Areas of scarring in the lungs bilaterally. Aortic Atherosclerosis (ICD10-I70.0) and Emphysema (ICD10-J43.9). Electronically Signed   By: Rolm Baptise M.D.   On: 06/17/2019 21:12   CT Abdomen Pelvis W Contrast  Result Date: 06/17/2019 CLINICAL DATA:  Hypercalcemia. EXAM: CT ABDOMEN AND PELVIS WITH CONTRAST TECHNIQUE: Multidetector CT imaging of the abdomen and pelvis was performed using the standard protocol following bolus administration of intravenous contrast. CONTRAST:  77mL OMNIPAQUE IOHEXOL 350 MG/ML SOLN COMPARISON:  None. FINDINGS: Lower chest: Masslike opacity seen in the left lower lobe as described on prior chest CT. Trace left pleural effusion. Volume loss on the left with elevation of the left hemidiaphragm. Advanced emphysema. Scarring in the right lower lobe. Hepatobiliary: No focal hepatic abnormality. Gallbladder unremarkable. Pancreas: Calcifications  throughout the pancreas compatible with chronic pancreatitis. No evidence of acute pancreatitis, fall suspicious pancreatic lesion or pancreatic ductal dilatation. Spleen: No focal abnormality.  Normal size. Adrenals/Urinary Tract: Small cysts in the left kidney. No hydronephrosis. Adrenal glands and urinary bladder unremarkable. Stomach/Bowel: Large stool burden throughout the colon. Distention of the rectum with stool concerning for fecal impaction. Stomach and small bowel decompressed, unremarkable. Vascular/Lymphatic:  Aortic atherosclerosis. No enlarged abdominal or pelvic lymph nodes. Reproductive: Prostate prominence. Other: No free fluid or free air. Musculoskeletal: Cachexia. No acute bony abnormality. Degenerative disc and facet disease in the lower lumbar spine. IMPRESSION: Masslike opacity in the left lower lobe as described on chest CT concerning for possible pulmonary mass lesions/malignancy. This could be further evaluated with bronchoscopy and/or PET CT as described on prior chest CT. Advanced emphysema. Large stool burden throughout the colon. Appearance is concerning for fecal impaction. Chronic pancreatitis changes.  No evidence of acute pancreatitis. Aortic atherosclerosis. Electronically Signed   By: Rolm Baptise M.D.   On: 06/17/2019 21:16   DG Chest Port 1 View  Result Date: 07/12/2019 CLINICAL DATA:  Dyspnea EXAM: PORTABLE CHEST 1 VIEW COMPARISON:  Two days ago FINDINGS: Retrocardiac opacity with volume loss, masslike appearance by CT and reportedly squamous cell carcinoma. Emphysema with hyperinflation of the right lung where there is interstitial coarsening. No visible residual pneumomediastinum. No pneumothorax is noted. IMPRESSION: 1. No progression from 2 days ago. Pneumo mediastinum is no longer clearly seen. 2. Left lower lobe mass and volume loss. 3. Emphysema. Electronically Signed   By: Monte Fantasia M.D.   On: 07/12/2019 09:06   DG Chest Port 1 View  Result Date:  06/25/2019 CLINICAL DATA:  Weakness, COVID-19 positive EXAM: PORTABLE CHEST 1 VIEW COMPARISON:  06/17/2019 FINDINGS: Chronic volume loss with consolidation in the left hemithorax, stable from prior. There may be mildly increased opacity within the aerated portion of the left lung. Heart size is stable. Mediastinal structures remain shifted to the left. There is hyperexpansion of the right lung field with chronic emphysematous changes and right apical scarring. No pneumothorax. IMPRESSION: Chronic volume loss with consolidation in the left hemithorax, stable from prior. There may be mildly increased opacity within the aerated portion of the left lung, which could reflect pneumonia given the patient's history. Electronically Signed   By: Davina Poke D.O.   On: 06/25/2019 13:31   DG C-Arm 1-60 Min-No Report  Result Date: 06/30/2019 Fluoroscopy was utilized by the requesting physician.  No radiographic interpretation.   ECHOCARDIOGRAM COMPLETE  Result Date: 06/26/2019   ECHOCARDIOGRAM REPORT   Patient Name:   TABER SWEETSER Date of Exam: 06/26/2019 Medical Rec #:  [id number]       Height:       68.0 in Accession #:    [id number]      Weight:       92.2 lb Date of Birth:  [birth year]       BSA:          1.47 m Patient Age:    27 years        BP:           89/66 mmHg Patient Gender: M               HR:           129 bpm. Exam Location:  [facility] Procedure: 2D Echo Indications:     ATRIAL FIBRILLATION 427.31/I48.91  History:         Patient has no prior history of Echocardiogram examinations.                  COPD; Risk Factors:Hypertension.  Sonographer:     Avanell Shackleton Referring Phys:  [place] Diagnosing Phys: Ida Rogue MD  Sonographer Comments: Image acquisition challenging due to patient body habitus. IMPRESSIONS  1. Left ventricular ejection fraction, by visual estimation, is  50 to 55%. The left ventricle has normal function. There is moderately increased left ventricular hypertrophy.  2. Left  ventricular diastolic parameters are indeterminate.  3. The left ventricle has no regional wall motion abnormalities.  4. Global right ventricle has normal systolic function.The right ventricular size is normal. No increase in right ventricular wall thickness.  5. Left atrial size was normal.  6. Tricuspid valve regurgitation is mild-moderate.  7. Mildly elevated pulmonary artery systolic pressure.  8. Tachycardia noted/atrial flutter, rate 130 bpm FINDINGS  Left Ventricle: Left ventricular ejection fraction, by visual estimation, is 50 to 55%. The left ventricle has normal function. The left ventricle has no regional wall motion abnormalities. There is moderately increased left ventricular hypertrophy. Left ventricular diastolic parameters are indeterminate. Normal left atrial pressure. Right Ventricle: The right ventricular size is normal. No increase in right ventricular wall thickness. Global RV systolic function is has normal systolic function. The tricuspid regurgitant velocity is 2.90 m/s, and with an assumed right atrial pressure  of 5 mmHg, the estimated right ventricular systolic pressure is mildly elevated at 38.6 mmHg. Left Atrium: Left atrial size was normal in size. Right Atrium: Right atrial size was normal in size Pericardium: There is no evidence of pericardial effusion. Mitral Valve: The mitral valve is normal in structure. Mild to moderate mitral valve regurgitation. No evidence of mitral valve stenosis by observation. Tricuspid Valve: The tricuspid valve is normal in structure. Tricuspid valve regurgitation is mild-moderate. Aortic Valve: The aortic valve is normal in structure. Aortic valve regurgitation is not visualized. Mild to moderate aortic valve sclerosis/calcification is present, without any evidence of aortic stenosis. Pulmonic Valve: The pulmonic valve was normal in structure. Pulmonic valve regurgitation is not visualized. Pulmonic regurgitation is not visualized. Aorta: The aortic  root, ascending aorta and aortic arch are all structurally normal, with no evidence of dilitation or obstruction. Venous: The inferior vena cava is normal in size with greater than 50% respiratory variability, suggesting right atrial pressure of 3 mmHg. IAS/Shunts: No atrial level shunt detected by color flow Doppler. There is no evidence of a patent foramen ovale. No ventricular septal defect is seen or detected. There is no evidence of an atrial septal defect.  LEFT VENTRICLE PLAX 2D LVIDd:         2.60 cm LVIDs:         2.26 cm LV PW:         0.68 cm LV IVS:        0.92 cm LVOT diam:     1.70 cm LV SV:         7 ml LV SV Index:   5.30 LVOT Area:     2.27 cm  LEFT ATRIUM             Index       RIGHT ATRIUM           Index LA diam:        2.70 cm 1.84 cm/m  RA Area:     16.00 cm LA Vol (A2C):   58.4 ml 39.72 ml/m RA Volume:   45.70 ml  31.08 ml/m LA Vol (A4C):   33.6 ml 22.85 ml/m LA Biplane Vol: 48.1 ml 32.71 ml/m   AORTA Ao Root diam: 3.30 cm TRICUSPID VALVE TR Peak grad:   33.6 mmHg TR Vmax:        305.00 cm/s  SHUNTS Systemic Diam: 1.70 cm  Ida Rogue MD Electronically signed by Ida Rogue MD Signature Date/Time:  06/26/2019/7:28:13 PM    Final    [place] EKG SITE RITE  Result Date: 06/26/2019 If Site Rite image not attached, placement could not be confirmed due to current cardiac rhythm.    Antimicrobials:   Ceftriaxone and doxycycline day 3   Subjective: Patient still with significant dyspnea and weakness Also with significant oral secretions although somewhat improved from prior eval yesterday Desatted with any level of exertion  Objective: Vitals:   07/12/19 0533 07/12/19 1250 07/12/19 2043 07/13/19 0441  BP: (!) 149/83 121/64 140/76 (!) 142/89  Pulse: 70 72 64 72  Resp: 18 20 18 20   Temp: 97.7 F (36.5 C) (!) 97.5 F (36.4 C) 98.5 F (36.9 C) 98.7 F (37.1 C)  TempSrc: Oral Oral Oral Oral  SpO2: 100% 100% 100% 100%  Weight:      Height:        Intake/Output Summary  (Last 24 hours) at 07/13/2019 1125 Last data filed at 07/13/2019 0900 Gross per 24 hour  Intake 1627.71 ml  Output 1700 ml  Net -72.29 ml   Filed Weights   07/08/19 1742  Weight: 43 kg    Examination:  General exam: Appears calm and comfortable chronically ill-appearing, cachectic Respiratory system: Mild rales bilaterally, mild rhonchi bilaterally. Cardiovascular system: S1 & S2 heard, RRR. No JVD, murmurs, rubs, gallops or clicks. No pedal edema. Gastrointestinal system: Abdomen is nondistended, soft and nontender. No organomegaly or masses felt. Normal bowel sounds heard. Central nervous system: Alert and oriented. No focal neurological deficits. Extremities: Thin cachectic, but warm and perfused Skin: No rashes, lesions or ulcers Psychiatry: Judgement and insight appear normal. Mood & affect appropriate.     Data Reviewed: I have personally reviewed following labs and imaging studies  CBC: Recent Labs  Lab 07/08/19 1811 07/09/19 0629 07/10/19 0501 07/11/19 0316 07/12/19 0636  WBC 16.8* 21.8* 19.1* 19.1* 20.0*  NEUTROABS 15.5*  --   --   --   --   HGB 10.2* 8.3* 7.6* 7.5* 7.6*  HCT 31.5* 25.1* 22.5* 22.3* 22.8*  MCV 84.7 81.8 83.0 82.9 82.6  PLT 655* 513* 458* 455* 953*   Basic Metabolic Panel: Recent Labs  Lab 07/08/19 1811 07/09/19 0629 07/10/19 0501 07/11/19 0316 07/12/19 0636  NA 136 137 137 138 137  K 4.4 4.2 3.8 4.0 4.0  CL 101 107 109 110 109  CO2 21* 24 24 23 22   GLUCOSE 140* 111* 101* 113* 77  BUN 26* 18 15 14 16   CREATININE 1.18 0.83 0.72 0.67 0.70  CALCIUM 9.2 8.4* 8.2* 8.1* 8.1*  MG  --   --  1.6* 2.1 1.9  PHOS  --   --  1.4* 2.0* 2.5   GFR: Estimated Creatinine Clearance: 46.3 mL/min (by C-G formula based on SCr of 0.7 mg/dL). Liver Function Tests: Recent Labs  Lab 07/08/19 1811 07/09/19 0629  AST 19 15  ALT 35 27  ALKPHOS 63 48  BILITOT 0.9 1.0  PROT 6.7 5.4*  ALBUMIN 3.2* 2.6*   No results for input(s): LIPASE, AMYLASE in the  last 168 hours. No results for input(s): AMMONIA in the last 168 hours. Coagulation Profile: No results for input(s): INR, PROTIME in the last 168 hours. Cardiac Enzymes: No results for input(s): CKTOTAL, CKMB, CKMBINDEX, TROPONINI in the last 168 hours. BNP (last 3 results) No results for input(s): PROBNP in the last 8760 hours. HbA1C: No results for input(s): HGBA1C in the last 72 hours. CBG: No results for input(s): GLUCAP in the last 168  hours. Lipid Profile: No results for input(s): CHOL, HDL, LDLCALC, TRIG, CHOLHDL, LDLDIRECT in the last 72 hours. Thyroid Function Tests: No results for input(s): TSH, T4TOTAL, FREET4, T3FREE, THYROIDAB in the last 72 hours. Anemia Panel: No results for input(s): VITAMINB12, FOLATE, FERRITIN, TIBC, IRON, RETICCTPCT in the last 72 hours. Sepsis Labs: Recent Labs  Lab 07/08/19 1811 07/08/19 1812 07/08/19 1943 07/09/19 0629 07/10/19 0501  PROCALCITON 0.10  --   --  <0.10 <0.10  LATICACIDVEN  --  3.1* 3.6*  --   --     Recent Results (from the past 240 hour(s))  Respiratory Panel by RT PCR (Flu A&B, Covid) - Nasopharyngeal Swab     Status: None   Collection Time: 07/08/19  7:43 PM   Specimen: Nasopharyngeal Swab  Result Value Ref Range Status   SARS Coronavirus 2 by RT PCR NEGATIVE NEGATIVE Final    Comment: (NOTE) SARS-CoV-2 target nucleic acids are NOT DETECTED. The SARS-CoV-2 RNA is generally detectable in upper respiratoy specimens during the acute phase of infection. The lowest concentration of SARS-CoV-2 viral copies this assay can detect is 131 copies/mL. A negative result does not preclude SARS-Cov-2 infection and should not be used as the sole basis for treatment or other patient management decisions. A negative result may occur with  improper specimen collection/handling, submission of specimen other than nasopharyngeal swab, presence of viral mutation(s) within the areas targeted by this assay, and inadequate number of viral  copies (<131 copies/mL). A negative result must be combined with clinical observations, patient history, and epidemiological information. The expected result is Negative. Fact Sheet for Patients:  PinkCheek.be Fact Sheet for Healthcare Providers:  GravelBags.it This test is not yet ap proved or cleared by the [place] FDA and  has been authorized for detection and/or diagnosis of SARS-CoV-2 by FDA under an Emergency Use Authorization (EUA). This EUA will remain  in effect (meaning this test can be used) for the duration of the COVID-19 declaration under Section 564(b)(1) of the Act, 21 U.S.C. section 360bbb-3(b)(1), unless the authorization is terminated or revoked sooner.    Influenza A by PCR NEGATIVE NEGATIVE Final   Influenza B by PCR NEGATIVE NEGATIVE Final    Comment: (NOTE) The Xpert Xpress SARS-CoV-2/FLU/RSV assay is intended as an aid in  the diagnosis of influenza from Nasopharyngeal swab specimens and  should not be used as a sole basis for treatment. Nasal washings and  aspirates are unacceptable for Xpert Xpress SARS-CoV-2/FLU/RSV  testing. Fact Sheet for Patients: PinkCheek.be Fact Sheet for Healthcare Providers: GravelBags.it This test is not yet approved or cleared by the [place] FDA and  has been authorized for detection and/or diagnosis of SARS-CoV-2 by  FDA under an Emergency Use Authorization (EUA). This EUA will remain  in effect (meaning this test can be used) for the duration of the  Covid-19 declaration under Section 564(b)(1) of the Act, 21  U.S.C. section 360bbb-3(b)(1), unless the authorization is  terminated or revoked. Performed at [facility], [address], [place], [place] [address]          Radiology Studies: DG Chest Hodge 1 View  Result Date: 07/12/2019 CLINICAL DATA:  Dyspnea EXAM: PORTABLE CHEST 1  VIEW COMPARISON:  Two days ago FINDINGS: Retrocardiac opacity with volume loss, masslike appearance by CT and reportedly squamous cell carcinoma. Emphysema with hyperinflation of the right lung where there is interstitial coarsening. No visible residual pneumomediastinum. No pneumothorax is noted. IMPRESSION: 1. No progression from 2 days ago. Pneumo  mediastinum is no longer clearly seen. 2. Left lower lobe mass and volume loss. 3. Emphysema. Electronically Signed   By: Monte Fantasia M.D.   On: 07/12/2019 09:06        Scheduled Meds:  albuterol  3 mL Inhalation See admin instructions   amiodarone  200 mg Oral BID   vitamin C  500 mg Oral Daily   aspirin EC  325 mg Oral Daily   bisacodyl  10 mg Oral BID   bisacodyl  10 mg Rectal Once   dexamethasone  4 mg Oral Daily   feeding supplement (ENSURE ENLIVE)  237 mL Oral TID BM   fluticasone furoate-vilanterol  1 puff Inhalation Daily   And   umeclidinium bromide  1 puff Inhalation Daily   heparin  5,000 Units Subcutaneous Q8H   loratadine  10 mg Oral Daily   megestrol  400 mg Oral Daily   multivitamin with minerals  1 tablet Oral Daily   omega-3 acid ethyl esters  1 g Oral Daily   pantoprazole  40 mg Oral Daily   polyethylene glycol  17 g Oral BID   vitamin B-12  500 mcg Oral Daily   vitamin E  400 Units Oral Daily   Continuous Infusions:  sodium chloride 50 mL/hr at 07/13/19 0924   sodium chloride Stopped (07/12/19 1222)   cefTRIAXone (ROCEPHIN)  IV 1 g (07/13/19 0928)   doxycycline (VIBRAMYCIN) IV 100 mg (07/13/19 1111)     LOS: 5 days    Time spent: 35 minutes    Nicolette Bang, MD [facility]  If 7PM-7AM, please contact night-coverage  07/13/2019, 11:25 AM

## 2019-07-14 DIAGNOSIS — Z8616 Personal history of COVID-19: Secondary | ICD-10-CM

## 2019-07-14 DIAGNOSIS — J9621 Acute and chronic respiratory failure with hypoxia: Secondary | ICD-10-CM

## 2019-07-14 DIAGNOSIS — R627 Adult failure to thrive: Secondary | ICD-10-CM

## 2019-07-14 DIAGNOSIS — Z66 Do not resuscitate: Secondary | ICD-10-CM

## 2019-07-14 DIAGNOSIS — Z9981 Dependence on supplemental oxygen: Secondary | ICD-10-CM

## 2019-07-14 DIAGNOSIS — J982 Interstitial emphysema: Secondary | ICD-10-CM

## 2019-07-14 DIAGNOSIS — Z8701 Personal history of pneumonia (recurrent): Secondary | ICD-10-CM

## 2019-07-14 DIAGNOSIS — C3432 Malignant neoplasm of lower lobe, left bronchus or lung: Secondary | ICD-10-CM

## 2019-07-14 DIAGNOSIS — R64 Cachexia: Secondary | ICD-10-CM

## 2019-07-14 DIAGNOSIS — D638 Anemia in other chronic diseases classified elsewhere: Secondary | ICD-10-CM

## 2019-07-14 DIAGNOSIS — E43 Unspecified severe protein-calorie malnutrition: Secondary | ICD-10-CM

## 2019-07-14 DIAGNOSIS — Z79899 Other long term (current) drug therapy: Secondary | ICD-10-CM

## 2019-07-14 NOTE — TOC Initial Note (Signed)
Transition of Care [facility]) - Initial/Assessment Note    Patient Details  Name: Corey Carr MRN: [id number] Date of Birth: Apr 09, 1941  [facility] CM/SW Contact:    Magnus Ivan, [place] Phone Number: 07/14/2019, 1:22 PM  Clinical Narrative:               CSW met with patient at bedside. Explained CSW role. Discussed recommendation for rehab at a SNF following discharge. Patient reported he does not want to go to a SNF. Patient said he is active with [facility] and would like to return home and resume these services. Patient said he lives with his wife. Patient reported he has a walker and oxygen in the home. He denied other equipment needs. [facility] [place] and PCP is Dr. Lovie Macadamia. CSW will submit request for PCP appointment to be scheduled within 3-5 days of discharge. CSW spoke with Malachy Mood at [facility] who reported they can continue home health services with patient upon discharge. Updated attending MD. CSW will continue to follow.     Expected Discharge Plan: [place] Barriers to Discharge: Continued Medical Work up   Patient Goals and CMS Choice Patient states their goals for this hospitalization and ongoing recovery are:: to return home with home health services.   Choice offered to / list presented to : NA(Patient reported he is already active with [facility] and would like to continue with them for home health services.)  Expected Discharge Plan and Services Expected Discharge Plan: Many Choice: [place] arrangements for the past 2 months: Single Family Home                           HH Arranged: RN, PT [facility] Agency: [place] Date [facility] Agency Contacted: 07/14/19 Time HH Agency Contacted: 69 Representative spoke with at [place]: Sharmon Revere  Prior Living Arrangements/Services Living arrangements for the past 2 months: [place] with:: Spouse Patient language and need for interpreter reviewed:: Yes Do you feel safe going back to the place where you live?: Yes      Need for Family Participation in Patient Care: Yes (Comment) Care giver support system in place?: Yes (comment)(Patient reports his wife is involved.) Current home services: DME, Home PT, Home RN Criminal Activity/Legal Involvement Pertinent to Current Situation/Hospitalization: No - Comment as needed  Activities of Daily Living Home Assistive Devices/Equipment: Dentures (specify type), Walker (specify type) ADL Screening (condition at time of admission) Patient's cognitive ability adequate to safely complete daily activities?: Yes Is the patient deaf or have difficulty hearing?: No Does the patient have difficulty seeing, even when wearing glasses/contacts?: No Does the patient have difficulty concentrating, remembering, or making decisions?: No Patient able to express need for assistance with ADLs?: Yes Does the patient have difficulty dressing or bathing?: Yes Independently performs ADLs?: Yes (appropriate for developmental age) Communication: Independent Dressing (OT): Needs assistance Is this a change from baseline?: Pre-admission baseline Grooming: Needs assistance Is this a change from baseline?: Pre-admission baseline Feeding: Independent Bathing: Needs assistance Is this a change from baseline?: Pre-admission baseline Toileting: Needs assistance Is this a change from baseline?: Pre-admission baseline In/Out Bed: Needs assistance Is this a change from baseline?: Pre-admission baseline Walks in Home: Needs assistance Is this a change from baseline?: Pre-admission baseline Does the patient have difficulty walking or  climbing stairs?: Yes Weakness of Legs: Both Weakness of Arms/Hands: Both  Permission Sought/Granted         Permission granted to share info w AGENCY: [place]        Emotional  Assessment Appearance:: Appears stated age Attitude/Demeanor/Rapport: Engaged Affect (typically observed): Calm Orientation: : Oriented to Self, Oriented to Place, Oriented to  Time, Oriented to Situation Alcohol / Substance Use: Not Applicable Psych Involvement: No (comment)  Admission diagnosis:  Shortness of breath [R06.02] Weakness [R53.1] Generalized weakness [R53.1] High serum lactate [R79.89] Patient Active Problem List   Diagnosis Date Noted  . Palliative care encounter   . Generalized weakness 07/08/2019  . Acute on chronic respiratory failure with hypoxia ([place])   . AF (paroxysmal atrial fibrillation) ([place])   . Atrial flutter ([place]) 06/27/2019  . Pressure injury of skin 06/26/2019  . Hypoglycemia 06/25/2019  . SIRS (systemic inflammatory response syndrome) ([place]) 06/25/2019  . Postobstructive pneumonia 06/25/2019  . Leukocytosis 06/25/2019  . Acute metabolic encephalopathy 43/32/9518  . Protein-calorie malnutrition, severe 06/18/2019  . Hypercalcemia 06/17/2019  . Chronic obstructive pulmonary disease ([place]) 06/17/2019  . History of 2019 novel coronavirus disease (COVID-19) 06/17/2019  . 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 Acute kidney injury ([place]) 05/24/2019  . Hyperkalemia 05/23/2019  . COVID-19 virus detected 05/10/2019  . AKI (acute kidney injury) ([place]) 05/09/2019  . Squamous cell carcinoma of left lung ([place]) 05/09/2019  . Essential hypertension 05/09/2019  . Asthma 05/09/2019  . Vitamin B12 deficiency 05/09/2019  . Dehydration 05/09/2019  . Seasonal allergies 05/09/2019  . Failure to thrive (0-17) 05/08/2019   PCP:  [name], MD [id number] [place] [place]Phone: [phone]: [phone]    Social Determinants of Health (SDOH) Interventions    Readmission Risk Interventions Readmission Risk Prevention Plan 07/14/2019  Medication  [place] ([place] Complete  PCP or Specialist appointment within 3-5 days of discharge Complete  HRI or Home Care Consult Complete  Palliative Care Screening [place]  Some recent data might be hidden

## 2019-07-14 NOTE — Progress Notes (Signed)
   07/14/19 1400  Clinical Encounter Type  Visited With Patient  Visit Type Follow-up  Referral From Chaplain  Consult/Referral To Chaplain  While doing rounds Chaplain visited with patient. Patient talked about his pastor, Rev. Cloyd Stagers. Patient told Chaplain that he has been a member at [employer] since 1991. Patient loves God. Patient also said that he loves the Psalms. Chaplain will come back tomorrow and read from the [place]. Patient gave Chaplain his pastor's telephone numbers, [phone] & [phone].  saying maybe you can call him. Patient requested for the healing of his body. Patient said that his body is to weak to take chmo therapy. Chaplain offered pastoral presence, empathy, and prayer.

## 2019-07-14 NOTE — Consult Note (Signed)
Pulmonary Medicine          Date: 07/14/2019,   MRN# [id number] Corey Carr 1940/10/17     AdmissionWeight: 43 kg                 CurrentWeight: 43 kg  Referring physician: Dr. Dwyane Dee    CHIEF COMPLAINT:   Failure to thrive associated with lung cancer   SUBJECTIVE   Patient is sitting up in bed eating breakfast.  He reports that appetite has been more stimulated after increase Megace dose.  Yesterday when getting up with physical therapy did have very brief moment of dyspnea however her oxygen saturation was measured at 100%.  He feels overall like he is getting stronger and would like to participate in PT today.   PAST MEDICAL HISTORY   Past Medical History:  Diagnosis Date  . Atrial flutter ([place])    CHADsVASc 3  . Hypertension      SURGICAL HISTORY   Past Surgical History:  Procedure Laterality Date  . KNEE SURGERY Right 1957  . VIDEO BRONCHOSCOPY WITH ENDOBRONCHIAL NAVIGATION N/A 06/30/2019   Procedure: VIDEO BRONCHOSCOPY WITH ENDOBRONCHIAL NAVIGATION;  Surgeon: Ottie Glazier, MD;  Location: [facility] ORS;  Service: Thoracic;  Laterality: N/A;  . VIDEO BRONCHOSCOPY WITH ENDOBRONCHIAL ULTRASOUND N/A 06/30/2019   Procedure: VIDEO BRONCHOSCOPY WITH ENDOBRONCHIAL ULTRASOUND;  Surgeon: Ottie Glazier, MD;  Location: [facility] ORS;  Service: Thoracic;  Laterality: N/A;     FAMILY HISTORY   Family History  Problem Relation Age of Onset  . Emphysema Father   . Lung cancer Maternal Uncle      SOCIAL HISTORY   Social History   Tobacco Use  . Smoking status: Former Smoker    Packs/day: 0.50    Years: 60.00    Pack years: 30.00    Types: Cigarettes    Quit date: 2015    Years since quitting: 6.1  . Smokeless tobacco: Never Used  Substance Use Topics  . Alcohol use: No  . Drug use: No     MEDICATIONS    Home Medication:    Current Medication:  Current Facility-Administered Medications:  .  0.9 %  sodium chloride infusion, , Intravenous,  Continuous, Val Riles, MD, Last Rate: 50 mL/hr at 07/14/19 0400, Rate Verify at 07/14/19 0400 .  0.9 %  sodium chloride infusion, , Intravenous, PRN, Marcell Anger, MD, Stopped at 07/12/19 1222 .  acetaminophen (TYLENOL) tablet 650 mg, 650 mg, Oral, Q6H PRN, 650 mg at 07/13/19 2032 **OR** acetaminophen (TYLENOL) suppository 650 mg, 650 mg, Rectal, Q6H PRN, Garba, Mohammad L, MD .  albuterol (PROVENTIL) (2.5 MG/3ML) 0.083% nebulizer solution 3 mL, 3 mL, Inhalation, See admin instructions, Gala Romney L, MD .  amiodarone (PACERONE) tablet 200 mg, 200 mg, Oral, BID, Jonelle Sidle, Mohammad L, MD, 200 mg at 07/13/19 2058 .  ascorbic acid (VITAMIN C) tablet 500 mg, 500 mg, Oral, Daily, Gala Romney L, MD, 500 mg at 07/13/19 0929 .  aspirin EC tablet 325 mg, 325 mg, Oral, Daily, Jonelle Sidle, Mohammad L, MD, 325 mg at 07/13/19 0930 .  bisacodyl (DULCOLAX) EC tablet 10 mg, 10 mg, Oral, BID, Val Riles, MD, 10 mg at 07/13/19 2058 .  bisacodyl (DULCOLAX) suppository 10 mg, 10 mg, Rectal, Once, Val Riles, MD .  cefTRIAXone (ROCEPHIN) 1 g in sodium chloride 0.9 % 100 mL IVPB, 1 g, Intravenous, Q24H, Ottie Glazier, MD, Stopped at 07/13/19 [phone] .  dexamethasone (DECADRON) tablet 4 mg, 4 mg, Oral, Daily,  Elwyn Reach, MD, 4 mg at 07/13/19 0930 .  doxycycline (VIBRAMYCIN) 100 mg in sodium chloride 0.9 % 250 mL IVPB, 100 mg, Intravenous, Q12H, Ottie Glazier, MD, Stopped at 07/13/19 2300 .  feeding supplement (ENSURE ENLIVE) (ENSURE ENLIVE) liquid 237 mL, 237 mL, Oral, TID BM, Jonelle Sidle, Mohammad L, MD, 237 mL at 07/13/19 2032 .  fluticasone (FLONASE) 50 MCG/ACT nasal spray 2 spray, 2 spray, Each Nare, Daily PRN, Garba, Mohammad L, MD .  fluticasone furoate-vilanterol (BREO ELLIPTA) 100-25 MCG/INH 1 puff, 1 puff, Inhalation, Daily, 1 puff at 07/13/19 0932 **AND** umeclidinium bromide (INCRUSE ELLIPTA) 62.5 MCG/INH 1 puff, 1 puff, Inhalation, Daily, Hallaji, Sheema M, RPH, 1 puff at 07/13/19 0931 .   heparin injection 5,000 Units, 5,000 Units, Subcutaneous, Q8H, Elwyn Reach, MD, 5,000 Units at 07/13/19 2100 .  HYDROcodone-acetaminophen (NORCO/VICODIN) 5-325 MG per tablet 1 tablet, 1 tablet, Oral, Q6H PRN, Elwyn Reach, MD, 1 tablet at 07/12/19 2051 .  loratadine (CLARITIN) tablet 10 mg, 10 mg, Oral, Daily, Gala Romney L, MD, 10 mg at 07/13/19 0929 .  megestrol (MEGACE) 400 MG/10ML suspension 400 mg, 400 mg, Oral, Daily, Kermit Arnette, MD, 400 mg at 07/13/19 0931 .  multivitamin with minerals tablet 1 tablet, 1 tablet, Oral, Daily, Val Riles, MD, 1 tablet at 07/13/19 0930 .  omega-3 acid ethyl esters (LOVAZA) capsule 1 g, 1 g, Oral, Daily, Jonelle Sidle, Mohammad L, MD, 1 g at 07/13/19 0930 .  ondansetron (ZOFRAN) tablet 4 mg, 4 mg, Oral, Q6H PRN **OR** ondansetron (ZOFRAN) injection 4 mg, 4 mg, Intravenous, Q6H PRN, Garba, Mohammad L, MD .  pantoprazole (PROTONIX) EC tablet 40 mg, 40 mg, Oral, Daily, Jonelle Sidle, Mohammad L, MD, 40 mg at 07/13/19 0929 .  polyethylene glycol (MIRALAX / GLYCOLAX) packet 17 g, 17 g, Oral, BID, Jonelle Sidle, Mohammad L, MD, 17 g at 07/13/19 0931 .  vitamin B-12 (CYANOCOBALAMIN) tablet 500 mcg, 500 mcg, Oral, Daily, Gala Romney L, MD, 500 mcg at 07/13/19 0931 .  vitamin E capsule 400 Units, 400 Units, Oral, Daily, Elwyn Reach, MD, 400 Units at 07/13/19 0930    ALLERGIES   Patient has no known allergies.     REVIEW OF SYSTEMS    Review of Systems:  Gen:  Denies  fever, sweats, chills weigh loss  HEENT: Denies blurred vision, double vision, ear pain, eye pain, hearing loss, nose bleeds, sore throat Cardiac:  No dizziness, chest pain or heaviness, chest tightness,edema Resp:   Denies cough or sputum porduction, shortness of breath,wheezing, hemoptysis,  Gi: Denies swallowing difficulty, stomach pain, nausea or vomiting, diarrhea, constipation, bowel incontinence Gu:  Denies bladder incontinence, burning urine Ext:   Denies Joint pain, stiffness  or swelling Skin: Denies  skin rash, easy bruising or bleeding or hives Endoc:  Denies polyuria, polydipsia , polyphagia or weight change Psych:   Denies depression, insomnia or hallucinations   Other:  All other systems negative   VS: BP 130/67 (BP Location: Right Arm)   Pulse 76   Temp 98.1 F (36.7 C) (Oral)   Resp 20   Ht 5\' 8"  (1.727 m)   Wt 43 kg   SpO2 (!) 89%   BMI 14.41 kg/m      PHYSICAL EXAM    GENERAL:NAD, no fevers, chills, no weakness no fatigue HEAD: Normocephalic, atraumatic.  EYES: Pupils equal, round, reactive to light. Extraocular muscles intact. No scleral icterus.  MOUTH: Moist mucosal membrane. Dentition intact. No abscess noted.  EAR, NOSE, THROAT: Clear without exudates.  No external lesions.  NECK: Supple. No thyromegaly. No nodules. No JVD.  PULMONARY: Decreased breath sounds bilaterally worse at the left lower lung zone CARDIOVASCULAR: S1 and S2. Regular rate and rhythm. No murmurs, rubs, or gallops. No edema. Pedal pulses 2+ bilaterally.  GASTROINTESTINAL: Soft, nontender, nondistended. No masses. Positive bowel sounds. No hepatosplenomegaly.  MUSCULOSKELETAL: No swelling, clubbing, or edema. Range of motion full in all extremities.  NEUROLOGIC: Cranial nerves II through XII are intact. No gross focal neurological deficits. Sensation intact. Reflexes intact.  SKIN: No ulceration, lesions, rashes, or cyanosis. Skin warm and dry. Turgor intact.  PSYCHIATRIC: Mood, affect within normal limits. The patient is awake, alert and oriented x 3. Insight, judgment intact.       IMAGING    DG Chest 1 View  Result Date: 07/10/2019 CLINICAL DATA:  Pneumomediastinum. Additional history provided: History of hypertension. Former smoker. EXAM: CHEST  1 VIEW COMPARISON:  CT angiogram chest 07/08/2019, chest radiograph 06/25/2019 FINDINGS: The cardiomediastinal silhouette is unchanged, although largely obscured. Persistent curvilinear opacity projecting along the  lateral aspect of the descending thoracic aorta consistent with previously demonstrated pneumomediastinum. Unchanged masslike consolidation within the left lower lung with chronic elevation of the left hemidiaphragm. A left pleural effusion is difficult to exclude. Redemonstrated background emphysematous change with biapical pleuroparenchymal scarring. Chronically increased interstitial markings within the aerated portions of the left lung. A right lower lobe pulmonary nodule was better appreciated on recent prior CT. No evidence of pneumothorax. No acute bony abnormality. IMPRESSION: 1. Similar appearance of pneumomediastinum as compared to chest radiograph 06/25/2019. However, progression of pneumomediastinum was noted on prior CT 07/08/2019. 2. Unchanged masslike consolidation within the left lower lung with chronic elevation of the left hemidiaphragm. 3. Redemonstrated background bullous emphysema with biapical pleuroparenchymal scarring. 4. A right lower lobe pulmonary nodule was better appreciated on recent prior CT. Electronically Signed   By: Kellie Simmering DO   On: 07/10/2019 11:21   DG Chest 2 View  Result Date: 06/17/2019 CLINICAL DATA:  Dizziness and shortness of breath. EXAM: CHEST - 2 VIEW COMPARISON:  05/23/2019. FINDINGS: Similar appearance of volume loss in the left hemithorax with elevation of the left hemidiaphragm and left base collapse/consolidative opacity. There is diffuse interstitial and pleural opacity in the left chest. Right lung is hyperexpanded without focal consolidation or pleural effusion. Bones are diffusely demineralized. IMPRESSION: Largely stable exam. Marked volume loss left hemithorax with posterior collapse/consolidative change in diffuse pleural thickening. Electronically Signed   By: Misty Stanley M.D.   On: 06/17/2019 16:43   CT Head Wo Contrast  Result Date: 07/08/2019 CLINICAL DATA:  Lung cancer. Assessment for hemorrhage in the setting of possible need for  anticoagulation. EXAM: CT HEAD WITHOUT CONTRAST TECHNIQUE: Contiguous axial images were obtained from the base of the skull through the vertex without intravenous contrast. COMPARISON:  None. FINDINGS: Brain: There is no mass, hemorrhage or extra-axial collection. There is generalized atrophy without lobar predilection. Hypodensity of the white matter is most commonly associated with chronic microvascular disease. Vascular: No abnormal hyperdensity of the major intracranial arteries or dural venous sinuses. No intracranial atherosclerosis. Skull: The visualized skull base, calvarium and extracranial soft tissues are normal. Sinuses/Orbits: No fluid levels or advanced mucosal thickening of the visualized paranasal sinuses. No mastoid or middle ear effusion. The orbits are normal. IMPRESSION: 1. No hemorrhage or other acute abnormality. 2. Chronic ischemic microangiopathy and generalized volume loss. Electronically Signed   By: Ulyses Jarred M.D.   On: 07/08/2019 19:37  CT Angio Chest PE W and/or Wo Contrast  Result Date: 07/08/2019 CLINICAL DATA:  79 year old male with shortness of breath, chronic oxygen. Currently being evaluated for possible left lower lobe mass. EXAM: CT ANGIOGRAPHY CHEST WITH CONTRAST TECHNIQUE: Multidetector CT imaging of the chest was performed using the standard protocol during bolus administration of intravenous contrast. Multiplanar CT image reconstructions and MIPs were obtained to evaluate the vascular anatomy. CONTRAST:  65mL OMNIPAQUE IOHEXOL 350 MG/ML SOLN COMPARISON:  CTA chest and CT Abdomen and Pelvis 06/17/2019. FINDINGS: Cardiovascular: Good contrast bolus timing in the pulmonary arterial tree. Pulmonary artery patency in the left lung appears stable since last month, with obliterated left lower lobe pulmonary arteries. No central or hilar pulmonary embolus. The right lung pulmonary arteries appear patent and within normal limits. No aortic contrast today. Calcified aortic  atherosclerosis. The heart remain shifted over to the left. No cardiomegaly or pericardial effusion is evident. Mediastinum/Nodes: Increased pneumomediastinum since 06/17/2019, gas now tracking into the bilateral lower neck and also toward the core of the diaphragm. The left hilum is indistinct as before, but elsewhere no mediastinal or right hilar lymphadenopathy is identified. Lungs/Pleura: The left lower lobe bronchus more abruptly terminates now on series 7, image 44 and there is no residual more distal left lower lobe airway pneumatization as there was last month. Complete masslike opacification of the left lower lobe otherwise appears stable. Superimposed bullous emphysema, left upper lobe and right lung base predominant scarring. An indeterminate superior segment right lower lobe 8-9 mm oval nodule is stable from last month, new since 03/05/2020. No pleural effusion or new right lung opacity. Upper Abdomen: Elevation of the left hemidiaphragm again suspected. The stomach and splenic flexure are visible on series 5, image 75. There is stool mixed with oral contrast or other dense material in the colon. Evidence of chronic calcific pancreatitis. Grossly negative visible noncontrast liver, kidneys and adrenal glands. Musculoskeletal: Osteopenia. No acute or suspicious osseous lesion identified. Review of the MIP images confirms the above findings. IMPRESSION: 1. Stable pulmonary arteries, obliterated in the left lower lung but with no evidence of acute PE. 2. Continued mass-like opacification of the left lower lung with worsening abrupt termination of the lower lobe bronchus again highly suspicious for tumor. There is superimposed chronic elevation of the left hemidiaphragm. There is increased pneumomediastinum, now with subcutaneous gas tracking into the lower neck. It appears a PET-CT was scheduled for 06/25/2019 but did not occur. 3. Underlying bullous Emphysema (ICD10-J43.9). Aortic Atherosclerosis  (ICD10-I70.0). Electronically Signed   By: Genevie Ann M.D.   On: 07/08/2019 19:36   CT Angio Chest PE W/Cm &/Or Wo Cm  Result Date: 06/17/2019 CLINICAL DATA:  Shortness of breath EXAM: CT ANGIOGRAPHY CHEST WITH CONTRAST TECHNIQUE: Multidetector CT imaging of the chest was performed using the standard protocol during bolus administration of intravenous contrast. Multiplanar CT image reconstructions and MIPs were obtained to evaluate the vascular anatomy. CONTRAST:  87mL OMNIPAQUE IOHEXOL 350 MG/ML SOLN COMPARISON:  03/06/2019 FINDINGS: Cardiovascular: No filling defects in the pulmonary arteries to suggest pulmonary emboli. Heart is normal size. Aorta normal caliber. Aortic and coronary artery atherosclerosis. Mediastinum/Nodes: There is pneumomediastinum. Gas seen around the main pulmonary artery and in the posterior mediastinum. No adenopathy. Lungs/Pleura: Volume loss on the left with elevation of the left hemidiaphragm. Findings are chronic. There is a chronic masslike area of consolidation in the left lower lobe which is similar to prior study. Advanced centrilobular emphysema and areas of scarring in both lungs. Possible  small left effusion, stable. Upper Abdomen: Imaging into the upper abdomen shows no acute findings. Musculoskeletal: Cachexia.  No acute bony abnormality. Review of the MIP images confirms the above findings. IMPRESSION: Pneumomediastinum seen in the middle and posterior mediastinum. No associated pneumothorax. Advanced centrilobular emphysema. Stable chronic volume loss on the left with elevation of the left hemidiaphragm and masslike consolidation in the left lower lobe. As recommended on prior study, bronchoscopy with attention to the left lower lobe and/or PET CT may be helpful to exclude left lower lobe mass lesion. Small right pleural effusion. Areas of scarring in the lungs bilaterally. Aortic Atherosclerosis (ICD10-I70.0) and Emphysema (ICD10-J43.9). Electronically Signed   By: Rolm Baptise M.D.   On: 06/17/2019 21:12   CT Abdomen Pelvis W Contrast  Result Date: 06/17/2019 CLINICAL DATA:  Hypercalcemia. EXAM: CT ABDOMEN AND PELVIS WITH CONTRAST TECHNIQUE: Multidetector CT imaging of the abdomen and pelvis was performed using the standard protocol following bolus administration of intravenous contrast. CONTRAST:  63mL OMNIPAQUE IOHEXOL 350 MG/ML SOLN COMPARISON:  None. FINDINGS: Lower chest: Masslike opacity seen in the left lower lobe as described on prior chest CT. Trace left pleural effusion. Volume loss on the left with elevation of the left hemidiaphragm. Advanced emphysema. Scarring in the right lower lobe. Hepatobiliary: No focal hepatic abnormality. Gallbladder unremarkable. Pancreas: Calcifications throughout the pancreas compatible with chronic pancreatitis. No evidence of acute pancreatitis, fall suspicious pancreatic lesion or pancreatic ductal dilatation. Spleen: No focal abnormality.  Normal size. Adrenals/Urinary Tract: Small cysts in the left kidney. No hydronephrosis. Adrenal glands and urinary bladder unremarkable. Stomach/Bowel: Large stool burden throughout the colon. Distention of the rectum with stool concerning for fecal impaction. Stomach and small bowel decompressed, unremarkable. Vascular/Lymphatic: Aortic atherosclerosis. No enlarged abdominal or pelvic lymph nodes. Reproductive: Prostate prominence. Other: No free fluid or free air. Musculoskeletal: Cachexia. No acute bony abnormality. Degenerative disc and facet disease in the lower lumbar spine. IMPRESSION: Masslike opacity in the left lower lobe as described on chest CT concerning for possible pulmonary mass lesions/malignancy. This could be further evaluated with bronchoscopy and/or PET CT as described on prior chest CT. Advanced emphysema. Large stool burden throughout the colon. Appearance is concerning for fecal impaction. Chronic pancreatitis changes.  No evidence of acute pancreatitis. Aortic  atherosclerosis. Electronically Signed   By: Rolm Baptise M.D.   On: 06/17/2019 21:16   DG Chest Port 1 View  Result Date: 07/12/2019 CLINICAL DATA:  Dyspnea EXAM: PORTABLE CHEST 1 VIEW COMPARISON:  Two days ago FINDINGS: Retrocardiac opacity with volume loss, masslike appearance by CT and reportedly squamous cell carcinoma. Emphysema with hyperinflation of the right lung where there is interstitial coarsening. No visible residual pneumomediastinum. No pneumothorax is noted. IMPRESSION: 1. No progression from 2 days ago. Pneumo mediastinum is no longer clearly seen. 2. Left lower lobe mass and volume loss. 3. Emphysema. Electronically Signed   By: Monte Fantasia M.D.   On: 07/12/2019 09:06   DG Chest Port 1 View  Result Date: 06/25/2019 CLINICAL DATA:  Weakness, COVID-19 positive EXAM: PORTABLE CHEST 1 VIEW COMPARISON:  06/17/2019 FINDINGS: Chronic volume loss with consolidation in the left hemithorax, stable from prior. There may be mildly increased opacity within the aerated portion of the left lung. Heart size is stable. Mediastinal structures remain shifted to the left. There is hyperexpansion of the right lung field with chronic emphysematous changes and right apical scarring. No pneumothorax. IMPRESSION: Chronic volume loss with consolidation in the left hemithorax, stable from prior. There may  be mildly increased opacity within the aerated portion of the left lung, which could reflect pneumonia given the patient's history. Electronically Signed   By: Davina Poke D.O.   On: 06/25/2019 13:31   DG C-Arm 1-60 Min-No Report  Result Date: 06/30/2019 Fluoroscopy was utilized by the requesting physician.  No radiographic interpretation.   ECHOCARDIOGRAM COMPLETE  Result Date: 06/26/2019   ECHOCARDIOGRAM REPORT   Patient Name:   Corey Carr Date of Exam: 06/26/2019 Medical Rec #:  [id number]       Height:       68.0 in Accession #:    [id number]      Weight:       92.2 lb Date of Birth:  23-Jan-1941        BSA:          1.47 m Patient Age:    49 years        BP:           89/66 mmHg Patient Gender: M               HR:           129 bpm. Exam Location:  [facility] Procedure: 2D Echo Indications:     ATRIAL FIBRILLATION 427.31/I48.91  History:         Patient has no prior history of Echocardiogram examinations.                  COPD; Risk Factors:Hypertension.  Sonographer:     Avanell Shackleton Referring Phys:  [place] Diagnosing Phys: Ida Rogue MD  Sonographer Comments: Image acquisition challenging due to patient body habitus. IMPRESSIONS  1. Left ventricular ejection fraction, by visual estimation, is 50 to 55%. The left ventricle has normal function. There is moderately increased left ventricular hypertrophy.  2. Left ventricular diastolic parameters are indeterminate.  3. The left ventricle has no regional wall motion abnormalities.  4. Global right ventricle has normal systolic function.The right ventricular size is normal. No increase in right ventricular wall thickness.  5. Left atrial size was normal.  6. Tricuspid valve regurgitation is mild-moderate.  7. Mildly elevated pulmonary artery systolic pressure.  8. Tachycardia noted/atrial flutter, rate 130 bpm FINDINGS  Left Ventricle: Left ventricular ejection fraction, by visual estimation, is 50 to 55%. The left ventricle has normal function. The left ventricle has no regional wall motion abnormalities. There is moderately increased left ventricular hypertrophy. Left ventricular diastolic parameters are indeterminate. Normal left atrial pressure. Right Ventricle: The right ventricular size is normal. No increase in right ventricular wall thickness. Global RV systolic function is has normal systolic function. The tricuspid regurgitant velocity is 2.90 m/s, and with an assumed right atrial pressure  of 5 mmHg, the estimated right ventricular systolic pressure is mildly elevated at 38.6 mmHg. Left Atrium: Left atrial size was normal in size. Right  Atrium: Right atrial size was normal in size Pericardium: There is no evidence of pericardial effusion. Mitral Valve: The mitral valve is normal in structure. Mild to moderate mitral valve regurgitation. No evidence of mitral valve stenosis by observation. Tricuspid Valve: The tricuspid valve is normal in structure. Tricuspid valve regurgitation is mild-moderate. Aortic Valve: The aortic valve is normal in structure. Aortic valve regurgitation is not visualized. Mild to moderate aortic valve sclerosis/calcification is present, without any evidence of aortic stenosis. Pulmonic Valve: The pulmonic valve was normal in structure. Pulmonic valve regurgitation is not visualized. Pulmonic regurgitation is not visualized. Aorta: The aortic root,  ascending aorta and aortic arch are all structurally normal, with no evidence of dilitation or obstruction. Venous: The inferior vena cava is normal in size with greater than 50% respiratory variability, suggesting right atrial pressure of 3 mmHg. IAS/Shunts: No atrial level shunt detected by color flow Doppler. There is no evidence of a patent foramen ovale. No ventricular septal defect is seen or detected. There is no evidence of an atrial septal defect.  LEFT VENTRICLE PLAX 2D LVIDd:         2.60 cm LVIDs:         2.26 cm LV PW:         0.68 cm LV IVS:        0.92 cm LVOT diam:     1.70 cm LV SV:         7 ml LV SV Index:   5.30 LVOT Area:     2.27 cm  LEFT ATRIUM             Index       RIGHT ATRIUM           Index LA diam:        2.70 cm 1.84 cm/m  RA Area:     16.00 cm LA Vol (A2C):   58.4 ml 39.72 ml/m RA Volume:   45.70 ml  31.08 ml/m LA Vol (A4C):   33.6 ml 22.85 ml/m LA Biplane Vol: 48.1 ml 32.71 ml/m   AORTA Ao Root diam: 3.30 cm TRICUSPID VALVE TR Peak grad:   33.6 mmHg TR Vmax:        305.00 cm/s  SHUNTS Systemic Diam: 1.70 cm  Ida Rogue MD Electronically signed by Ida Rogue MD Signature Date/Time: 06/26/2019/7:28:13 PM    Final    [place] EKG SITE  RITE  Result Date: 06/26/2019 If Site Rite image not attached, placement could not be confirmed due to current cardiac rhythm.     ASSESSMENT/PLAN    Acute on chronic hypoxemic respiratory failure -Due to severe bullous emphysema with COPD as well as left lower lobe postobstructive cancer-related process with atelectasis -Recommend incentive spirometry and chest physiotherapy - Empiric community-acquired pneumonia Therapy-patient with elevated WBC unlikely from steroids alone -We will place on Rocephin and Zithromax IV -Agree with dexamethasone 4 mg once daily     Left lower lobe squamous cell lung CA-stage III-IV -Status post tissue diagnosis-oncology on case Dr. Grayland Ormond appreciate input -There is plan for possible immunotherapy based on patient's condition and performance status -Has had recurrent admissions for worsening respiratory status -Appreciate palliative care recommendations -Appreciate RD dietary consultation with increased Megace dose patient is eating well this morning  Pneumomediastinum - Supportive care -avoid positive pressure ventilation if possible -narcotic cough suppresant to minimize forceful cough with goal to decrease shearing forces and allow escaped air to resorb -Chest physiotherapy with percussor and vest -incentive spirometer at bedside please encourage to use    Cancer associated Cachexia (CAC)   - patient reports being dehydrated at home, states he has poor appetite, he is on appropriate Megace dosing now   - dietary evaluation with RD consultation    - Severe Protein calorie malnutrition - overall poor prognosis   - PT/OT     Thank you for allowing me to participate in the care of this patient.   Patient/Family are satisfied with care plan and all questions have been answered.   This document was prepared using Dragon voice recognition software and may include unintentional dictation errors.  Ottie Glazier, M.D.  Division of  [facility]

## 2019-07-14 NOTE — Progress Notes (Signed)
Physical Therapy Treatment Patient Details Name: Corey Carr MRN: [id number] DOB: Feb 01, 1941 Today's Date: 07/14/2019    History of Present Illness Corey Carr is a 28yoM who comes to [facility] 07/08/19 after acute onset worseing weakness and SOB. Pt is familiar to our services from prior admissions. 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 Pt has continued to have advancing cachexia over th epast several weaks.    PT Comments    Ready for session.  3 lpm at rest. 100% sats.  To EOB with supervision.  Inc BM noted and he was able to stand for care.  He then continued to walk x 1 around unit with RW and min guard on 3 lpm.  Generally steady gait but global weakness noted.  Upon sitting, Sats from telemetry monitor noted to be 78 but quickly increased to baseline.  Since recovery was so fast, I am assuming poor contact from finger monitor with mobility was primary cause.  He had no c/o excessive SOB but did have some increased respirations.  Able to reposition himself without assist.  Discussed discharge plan.  He is refusing SNF per chart review and again in discussion.  Pt has improved mobility and overall activity tolerance.  Will update discharge recommendations as he is able to ambulate household distances with +1 assist and has services and support in place.   Follow Up Recommendations  Other (comment);Home health PT;Supervision for mobility/OOB     Equipment Recommendations  None recommended by PT    Recommendations for Other Services       Precautions / Restrictions Precautions Precautions: Fall Precaution Comments: High Fall Restrictions Weight Bearing Restrictions: No    Mobility  Bed Mobility Overal bed mobility: Modified Independent                Transfers Overall transfer level: Needs assistance Equipment used: Rolling walker (2 wheeled) Transfers: Sit to/from Stand Sit to Stand: Min guard         General  transfer comment: CGA for safety with transfers  Ambulation/Gait Ambulation/Gait assistance: Min guard Gait Distance (Feet): 160 Feet Assistive device: Rolling walker (2 wheeled) Gait Pattern/deviations: Step-through pattern;Narrow base of support;Scissoring;Trunk flexed Gait velocity: decreassed       Stairs             Wheelchair Mobility    Modified Rankin (Stroke Patients Only)       Balance Overall balance assessment: Needs assistance Sitting-balance support: Feet supported Sitting balance-Leahy Scale: Good     Standing balance support: Bilateral upper extremity supported Standing balance-Leahy Scale: Fair                              Cognition Arousal/Alertness: Awake/alert Behavior During Therapy: WFL for tasks assessed/performed Overall Cognitive Status: Within Functional Limits for tasks assessed                                 General Comments: Pt is A and O x 4. does like things to be neat and has a particular way of doing blankets.       Exercises      General Comments        Pertinent Vitals/Pain Pain Assessment: No/denies pain    Home Living  Prior Function            PT Goals (current goals can now be found in the care plan section) Progress towards PT goals: Progressing toward goals    Frequency    Min 2X/week      PT Plan Discharge plan needs to be updated    Co-evaluation              AM-PAC PT "6 Clicks" Mobility   Outcome Measure  Help needed turning from your back to your side while in a flat bed without using bedrails?: None Help needed moving from lying on your back to sitting on the side of a flat bed without using bedrails?: None Help needed moving to and from a bed to a chair (including a wheelchair)?: A Little Help needed standing up from a chair using your arms (e.g., wheelchair or bedside chair)?: A Little Help needed to walk in hospital room?:  A Little Help needed climbing 3-5 steps with a railing? : A Little 6 Click Score: 20    End of Session Equipment Utilized During Treatment: Oxygen;Gait belt Activity Tolerance: Patient tolerated treatment well Patient left: in bed;with call bell/phone within reach;with bed alarm set Nurse Communication: Mobility status       Time: 9728-2060 PT Time Calculation (min) (ACUTE ONLY): 17 min  Charges:  $Gait Training: 8-22 mins                    Chesley Noon, PTA 07/14/19, 3:05 PM

## 2019-07-14 NOTE — Progress Notes (Signed)
PROGRESS NOTE                                                                                                                                                                                                             Patient Demographics:    Corey Carr, is a 79 y.o. male, DOB - 02/21/1941, BZJ:[id number]  Admit date - 07/08/2019   Admitting Physician Elwyn Reach, MD  Outpatient Primary MD for the patient is [name], MD  LOS - 6  Outpatient Specialists: Oncology  Chief Complaint  Patient presents with   Shortness of Breath       Brief Narrative   79 year old male with history of recently diagnosed lung cancer, paroxysmal A. fib/flutter, COPD with chronic respiratory failure on 3 L O2, history of COVID-19 pneumonia in 12/20, hypertension who presented with progressive shortness of breath.  Also having increasing weakness with failure to thrive.  During his last hospitalization he had pneumomediastinum. On admission CT angiogram of the chest showed stable pulmonary arteries with continuous masslike opacification of the left lower lung with worsened chronic elevation of the left hemidiaphragm, increased pneumomediastinum with subcutaneous gas tracking into the lower neck.   Subjective:   Patient reports feeling weak.  Denies any worsened shortness of breath.  Assessment  & Plan :    Principal Problem: Acute on chronic respiratory failure with hypoxia Has severe emphysema with COPD exacerbation.  Also possible lobar pneumonia. On empiric IV Rocephin and doxycycline.  Leukocytosis secondary to steroid use.  Continue Decadron 4 mg daily.  Pulmonary consult appreciated.  Recommends incentive spirometry and chest PT. Continue O2 via nasal cannula, as needed nebs.  Active Problems: Left lower lobe squamous cell lung cancer (stage III-IV) Plan on immunotherapy per oncology.  Not sure if patient will tolerate  due to his failure to thrive and respiratory failure.  Pneumomediastinum Chest PT, IS, narcotic antitussive.  Failure to thrive with severe protein calorie malnutrition/malignant cachexia Nutrition consult appreciated.  PT recommends home health.  Palliative care consult appreciated.  Upon the discussion wife understands patient's failure to thrive but would like to treat the treatable and attempt treatment for his cancer if possible.  Hypomagnesemia/hypophosphatemia Being replenished.  Anemia of chronic disease H&H stable.  Essential hypertension Stable on home meds     Code Status : DNR  Family Communication  :  Updated wife on the phone  Disposition Plan  : Home with home health in the next 24-48 hours if respiratory function improves  Barriers For Discharge : Acute respiratory failure/lobar pneumonia  Consults  : Pulmonary, palliative care  Procedures  : CT angiogram of the chest, CT head  DVT Prophylaxis  :  Lovenox -   Lab Results  Component Value Date   PLT 439 (H) 07/12/2019    Antibiotics   Anti-infectives (From admission, onward)   Start     Dose/Rate Route Frequency Ordered Stop   07/12/19 1000  doxycycline (VIBRAMYCIN) 100 mg in sodium chloride 0.9 % 250 mL IVPB     100 mg 125 mL/hr over 120 Minutes Intravenous Every 12 hours 07/11/19 1359     07/11/19 1200  cefTRIAXone (ROCEPHIN) 1 g in sodium chloride 0.9 % 100 mL IVPB     1 g 200 mL/hr over 30 Minutes Intravenous Every 24 hours 07/11/19 1024     07/11/19 1200  azithromycin (ZITHROMAX) 500 mg in sodium chloride 0.9 % 250 mL IVPB  Status:  Discontinued     500 mg 250 mL/hr over 60 Minutes Intravenous Every 24 hours 07/11/19 1024 07/11/19 1359        Objective:   Vitals:   07/14/19 0901 07/14/19 0901 07/14/19 1246 07/14/19 1400  BP: 130/67 130/67 (!) 149/68 115/81  Pulse: 76 76 75 70  Resp: 20 20 20 18   Temp: 98.1 F (36.7 C) 98.1 F (36.7 C) 97.9 F (36.6 C) 97.9 F (36.6 C)  TempSrc:  Oral Oral Oral Oral  SpO2: 100% (!) 89% 100%   Weight:      Height:        Wt Readings from Last 3 Encounters:  07/08/19 43 kg  06/30/19 43.5 kg  06/24/19 42.2 kg     Intake/Output Summary (Last 24 hours) at 07/14/2019 1646 Last data filed at 07/14/2019 1620 Gross per 24 hour  Intake 1807.58 ml  Output 2200 ml  Net -392.42 ml     Physical Exam  Gen: not in distress, fatigued HEENT: Pallor present, temporal wasting, moist mucosa, supple neck Chest: Few scattered rhonchi CVS: N S1&S2, no murmurs,  GI: soft, NT, ND,  Musculoskeletal: warm, no edema     Data Review:    CBC Recent Labs  Lab 07/08/19 1811 07/09/19 0629 07/10/19 0501 07/11/19 0316 07/12/19 0636  WBC 16.8* 21.8* 19.1* 19.1* 20.0*  HGB 10.2* 8.3* 7.6* 7.5* 7.6*  HCT 31.5* 25.1* 22.5* 22.3* 22.8*  PLT 655* 513* 458* 455* 439*  MCV 84.7 81.8 83.0 82.9 82.6  MCH 27.4 27.0 28.0 27.9 27.5  MCHC 32.4 33.1 33.8 33.6 33.3  RDW 17.3* 17.4* 17.2* 17.5* 17.6*  LYMPHSABS 0.5*  --   --   --   --   MONOABS 0.6  --   --   --   --   EOSABS 0.0  --   --   --   --   BASOSABS 0.0  --   --   --   --     Chemistries  Recent Labs  Lab 07/08/19 1811 07/09/19 0629 07/10/19 0501 07/11/19 0316 07/12/19 0636  NA 136 137 137 138 137  K 4.4 4.2 3.8 4.0 4.0  CL 101 107 109 110 109  CO2 21* 24 24 23 22   GLUCOSE 140* 111* 101* 113* 77  BUN 26* 18 15 14 16   CREATININE 1.18 0.83 0.72 0.67 0.70  CALCIUM 9.2 8.4* 8.2* 8.1* 8.1*  MG  --   --  1.6* 2.1 1.9  AST 19 15  --   --   --   ALT 35 27  --   --   --   ALKPHOS 63 48  --   --   --   BILITOT 0.9 1.0  --   --   --    ------------------------------------------------------------------------------------------------------------------ No results for input(s): CHOL, HDL, LDLCALC, TRIG, CHOLHDL, LDLDIRECT in the last 72 hours.  Lab Results  Component Value Date   HGBA1C 5.6 06/28/2019    ------------------------------------------------------------------------------------------------------------------ No results for input(s): TSH, T4TOTAL, T3FREE, THYROIDAB in the last 72 hours.  Invalid input(s): FREET3 ------------------------------------------------------------------------------------------------------------------ No results for input(s): VITAMINB12, FOLATE, FERRITIN, TIBC, IRON, RETICCTPCT in the last 72 hours.  Coagulation profile No results for input(s): INR, PROTIME in the last 168 hours.  No results for input(s): DDIMER in the last 72 hours.  Cardiac Enzymes No results for input(s): CKMB, TROPONINI, MYOGLOBIN in the last 168 hours.  Invalid input(s): CK ------------------------------------------------------------------------------------------------------------------    Component Value Date/Time   BNP 146.0 (H) 07/08/2019 1811    Inpatient Medications  Scheduled Meds:  albuterol  3 mL Inhalation See admin instructions   amiodarone  200 mg Oral BID   vitamin C  500 mg Oral Daily   aspirin EC  325 mg Oral Daily   bisacodyl  10 mg Oral BID   bisacodyl  10 mg Rectal Once   dexamethasone  4 mg Oral Daily   feeding supplement (ENSURE ENLIVE)  237 mL Oral TID BM   fluticasone furoate-vilanterol  1 puff Inhalation Daily   And   umeclidinium bromide  1 puff Inhalation Daily   heparin  5,000 Units Subcutaneous Q8H   loratadine  10 mg Oral Daily   megestrol  400 mg Oral Daily   multivitamin with minerals  1 tablet Oral Daily   omega-3 acid ethyl esters  1 g Oral Daily   pantoprazole  40 mg Oral Daily   polyethylene glycol  17 g Oral BID   vitamin B-12  500 mcg Oral Daily   vitamin E  400 Units Oral Daily   Continuous Infusions:  sodium chloride 50 mL/hr at 07/14/19 1545   sodium chloride Stopped (07/12/19 1222)   cefTRIAXone (ROCEPHIN)  IV Stopped (07/14/19 1020)   doxycycline (VIBRAMYCIN) IV Stopped (07/14/19 1259)   PRN  Meds:.sodium chloride, acetaminophen **OR** acetaminophen, fluticasone, HYDROcodone-acetaminophen, ondansetron **OR** ondansetron (ZOFRAN) IV  Micro Results Recent Results (from the past 240 hour(s))  Respiratory Panel by RT PCR (Flu A&B, Covid) - Nasopharyngeal Swab     Status: None   Collection Time: 07/08/19  7:43 PM   Specimen: Nasopharyngeal Swab  Result Value Ref Range Status   SARS Coronavirus 2 by RT PCR NEGATIVE NEGATIVE Final    Comment: (NOTE) SARS-CoV-2 target nucleic acids are NOT DETECTED. The SARS-CoV-2 RNA is generally detectable in upper respiratoy specimens during the acute phase of infection. The lowest concentration of SARS-CoV-2 viral copies this assay can detect is 131 copies/mL. A negative result does not preclude SARS-Cov-2 infection and should not be used as the sole basis for treatment or other patient management decisions. A negative result may occur with  improper specimen collection/handling, submission of specimen other than nasopharyngeal swab, presence of viral mutation(s) within the areas targeted by this assay, and inadequate number of viral copies (<131 copies/mL). A negative result must be combined with clinical observations, patient history, and epidemiological information. The expected result is Negative. Fact Sheet  for Patients:  PinkCheek.be Fact Sheet for Healthcare Providers:  GravelBags.it This test is not yet ap proved or cleared by the [place] FDA and  has been authorized for detection and/or diagnosis of SARS-CoV-2 by FDA under an Emergency Use Authorization (EUA). This EUA will remain  in effect (meaning this test can be used) for the duration of the COVID-19 declaration under Section 564(b)(1) of the Act, 21 U.S.C. section 360bbb-3(b)(1), unless the authorization is terminated or revoked sooner.    Influenza A by PCR NEGATIVE NEGATIVE Final   Influenza B by PCR NEGATIVE  NEGATIVE Final    Comment: (NOTE) The Xpert Xpress SARS-CoV-2/FLU/RSV assay is intended as an aid in  the diagnosis of influenza from Nasopharyngeal swab specimens and  should not be used as a sole basis for treatment. Nasal washings and  aspirates are unacceptable for Xpert Xpress SARS-CoV-2/FLU/RSV  testing. Fact Sheet for Patients: PinkCheek.be Fact Sheet for Healthcare Providers: GravelBags.it This test is not yet approved or cleared by the [place] FDA and  has been authorized for detection and/or diagnosis of SARS-CoV-2 by  FDA under an Emergency Use Authorization (EUA). This EUA will remain  in effect (meaning this test can be used) for the duration of the  Covid-19 declaration under Section 564(b)(1) of the Act, 21  U.S.C. section 360bbb-3(b)(1), unless the authorization is  terminated or revoked. Performed at [facility], [address], [place], [place] [address]     Radiology Reports DG Chest 1 View  Result Date: 07/10/2019 CLINICAL DATA:  Pneumomediastinum. Additional history provided: History of hypertension. Former smoker. EXAM: CHEST  1 VIEW COMPARISON:  CT angiogram chest 07/08/2019, chest radiograph 06/25/2019 FINDINGS: The cardiomediastinal silhouette is unchanged, although largely obscured. Persistent curvilinear opacity projecting along the lateral aspect of the descending thoracic aorta consistent with previously demonstrated pneumomediastinum. Unchanged masslike consolidation within the left lower lung with chronic elevation of the left hemidiaphragm. A left pleural effusion is difficult to exclude. Redemonstrated background emphysematous change with biapical pleuroparenchymal scarring. Chronically increased interstitial markings within the aerated portions of the left lung. A right lower lobe pulmonary nodule was better appreciated on recent prior CT. No evidence of pneumothorax. No acute bony  abnormality. IMPRESSION: 1. Similar appearance of pneumomediastinum as compared to chest radiograph 06/25/2019. However, progression of pneumomediastinum was noted on prior CT 07/08/2019. 2. Unchanged masslike consolidation within the left lower lung with chronic elevation of the left hemidiaphragm. 3. Redemonstrated background bullous emphysema with biapical pleuroparenchymal scarring. 4. A right lower lobe pulmonary nodule was better appreciated on recent prior CT. Electronically Signed   By: Kellie Simmering DO   On: 07/10/2019 11:21   DG Chest 2 View  Result Date: 06/17/2019 CLINICAL DATA:  Dizziness and shortness of breath. EXAM: CHEST - 2 VIEW COMPARISON:  05/23/2019. FINDINGS: Similar appearance of volume loss in the left hemithorax with elevation of the left hemidiaphragm and left base collapse/consolidative opacity. There is diffuse interstitial and pleural opacity in the left chest. Right lung is hyperexpanded without focal consolidation or pleural effusion. Bones are diffusely demineralized. IMPRESSION: Largely stable exam. Marked volume loss left hemithorax with posterior collapse/consolidative change in diffuse pleural thickening. Electronically Signed   By: Misty Stanley M.D.   On: 06/17/2019 16:43   CT Head Wo Contrast  Result Date: 07/08/2019 CLINICAL DATA:  Lung cancer. Assessment for hemorrhage in the setting of possible need for anticoagulation. EXAM: CT HEAD WITHOUT CONTRAST TECHNIQUE: Contiguous axial images were obtained from the base of the  skull through the vertex without intravenous contrast. COMPARISON:  None. FINDINGS: Brain: There is no mass, hemorrhage or extra-axial collection. There is generalized atrophy without lobar predilection. Hypodensity of the white matter is most commonly associated with chronic microvascular disease. Vascular: No abnormal hyperdensity of the major intracranial arteries or dural venous sinuses. No intracranial atherosclerosis. Skull: The visualized skull  base, calvarium and extracranial soft tissues are normal. Sinuses/Orbits: No fluid levels or advanced mucosal thickening of the visualized paranasal sinuses. No mastoid or middle ear effusion. The orbits are normal. IMPRESSION: 1. No hemorrhage or other acute abnormality. 2. Chronic ischemic microangiopathy and generalized volume loss. Electronically Signed   By: Ulyses Jarred M.D.   On: 07/08/2019 19:37   CT Angio Chest PE W and/or Wo Contrast  Result Date: 07/08/2019 CLINICAL DATA:  79 year old male with shortness of breath, chronic oxygen. Currently being evaluated for possible left lower lobe mass. EXAM: CT ANGIOGRAPHY CHEST WITH CONTRAST TECHNIQUE: Multidetector CT imaging of the chest was performed using the standard protocol during bolus administration of intravenous contrast. Multiplanar CT image reconstructions and MIPs were obtained to evaluate the vascular anatomy. CONTRAST:  56mL OMNIPAQUE IOHEXOL 350 MG/ML SOLN COMPARISON:  CTA chest and CT Abdomen and Pelvis 06/17/2019. FINDINGS: Cardiovascular: Good contrast bolus timing in the pulmonary arterial tree. Pulmonary artery patency in the left lung appears stable since last month, with obliterated left lower lobe pulmonary arteries. No central or hilar pulmonary embolus. The right lung pulmonary arteries appear patent and within normal limits. No aortic contrast today. Calcified aortic atherosclerosis. The heart remain shifted over to the left. No cardiomegaly or pericardial effusion is evident. Mediastinum/Nodes: Increased pneumomediastinum since 06/17/2019, gas now tracking into the bilateral lower neck and also toward the core of the diaphragm. The left hilum is indistinct as before, but elsewhere no mediastinal or right hilar lymphadenopathy is identified. Lungs/Pleura: The left lower lobe bronchus more abruptly terminates now on series 7, image 44 and there is no residual more distal left lower lobe airway pneumatization as there was last month.  Complete masslike opacification of the left lower lobe otherwise appears stable. Superimposed bullous emphysema, left upper lobe and right lung base predominant scarring. An indeterminate superior segment right lower lobe 8-9 mm oval nodule is stable from last month, new since 03/05/2020. No pleural effusion or new right lung opacity. Upper Abdomen: Elevation of the left hemidiaphragm again suspected. The stomach and splenic flexure are visible on series 5, image 75. There is stool mixed with oral contrast or other dense material in the colon. Evidence of chronic calcific pancreatitis. Grossly negative visible noncontrast liver, kidneys and adrenal glands. Musculoskeletal: Osteopenia. No acute or suspicious osseous lesion identified. Review of the MIP images confirms the above findings. IMPRESSION: 1. Stable pulmonary arteries, obliterated in the left lower lung but with no evidence of acute PE. 2. Continued mass-like opacification of the left lower lung with worsening abrupt termination of the lower lobe bronchus again highly suspicious for tumor. There is superimposed chronic elevation of the left hemidiaphragm. There is increased pneumomediastinum, now with subcutaneous gas tracking into the lower neck. It appears a PET-CT was scheduled for 06/25/2019 but did not occur. 3. Underlying bullous Emphysema (ICD10-J43.9). Aortic Atherosclerosis (ICD10-I70.0). Electronically Signed   By: Genevie Ann M.D.   On: 07/08/2019 19:36   CT Angio Chest PE W/Cm &/Or Wo Cm  Result Date: 06/17/2019 CLINICAL DATA:  Shortness of breath EXAM: CT ANGIOGRAPHY CHEST WITH CONTRAST TECHNIQUE: Multidetector CT imaging of the chest was  performed using the standard protocol during bolus administration of intravenous contrast. Multiplanar CT image reconstructions and MIPs were obtained to evaluate the vascular anatomy. CONTRAST:  40mL OMNIPAQUE IOHEXOL 350 MG/ML SOLN COMPARISON:  03/06/2019 FINDINGS: Cardiovascular: No filling defects in the  pulmonary arteries to suggest pulmonary emboli. Heart is normal size. Aorta normal caliber. Aortic and coronary artery atherosclerosis. Mediastinum/Nodes: There is pneumomediastinum. Gas seen around the main pulmonary artery and in the posterior mediastinum. No adenopathy. Lungs/Pleura: Volume loss on the left with elevation of the left hemidiaphragm. Findings are chronic. There is a chronic masslike area of consolidation in the left lower lobe which is similar to prior study. Advanced centrilobular emphysema and areas of scarring in both lungs. Possible small left effusion, stable. Upper Abdomen: Imaging into the upper abdomen shows no acute findings. Musculoskeletal: Cachexia.  No acute bony abnormality. Review of the MIP images confirms the above findings. IMPRESSION: Pneumomediastinum seen in the middle and posterior mediastinum. No associated pneumothorax. Advanced centrilobular emphysema. Stable chronic volume loss on the left with elevation of the left hemidiaphragm and masslike consolidation in the left lower lobe. As recommended on prior study, bronchoscopy with attention to the left lower lobe and/or PET CT may be helpful to exclude left lower lobe mass lesion. Small right pleural effusion. Areas of scarring in the lungs bilaterally. Aortic Atherosclerosis (ICD10-I70.0) and Emphysema (ICD10-J43.9). Electronically Signed   By: Rolm Baptise M.D.   On: 06/17/2019 21:12   CT Abdomen Pelvis W Contrast  Result Date: 06/17/2019 CLINICAL DATA:  Hypercalcemia. EXAM: CT ABDOMEN AND PELVIS WITH CONTRAST TECHNIQUE: Multidetector CT imaging of the abdomen and pelvis was performed using the standard protocol following bolus administration of intravenous contrast. CONTRAST:  2mL OMNIPAQUE IOHEXOL 350 MG/ML SOLN COMPARISON:  None. FINDINGS: Lower chest: Masslike opacity seen in the left lower lobe as described on prior chest CT. Trace left pleural effusion. Volume loss on the left with elevation of the left  hemidiaphragm. Advanced emphysema. Scarring in the right lower lobe. Hepatobiliary: No focal hepatic abnormality. Gallbladder unremarkable. Pancreas: Calcifications throughout the pancreas compatible with chronic pancreatitis. No evidence of acute pancreatitis, fall suspicious pancreatic lesion or pancreatic ductal dilatation. Spleen: No focal abnormality.  Normal size. Adrenals/Urinary Tract: Small cysts in the left kidney. No hydronephrosis. Adrenal glands and urinary bladder unremarkable. Stomach/Bowel: Large stool burden throughout the colon. Distention of the rectum with stool concerning for fecal impaction. Stomach and small bowel decompressed, unremarkable. Vascular/Lymphatic: Aortic atherosclerosis. No enlarged abdominal or pelvic lymph nodes. Reproductive: Prostate prominence. Other: No free fluid or free air. Musculoskeletal: Cachexia. No acute bony abnormality. Degenerative disc and facet disease in the lower lumbar spine. IMPRESSION: Masslike opacity in the left lower lobe as described on chest CT concerning for possible pulmonary mass lesions/malignancy. This could be further evaluated with bronchoscopy and/or PET CT as described on prior chest CT. Advanced emphysema. Large stool burden throughout the colon. Appearance is concerning for fecal impaction. Chronic pancreatitis changes.  No evidence of acute pancreatitis. Aortic atherosclerosis. Electronically Signed   By: Rolm Baptise M.D.   On: 06/17/2019 21:16   DG Chest Port 1 View  Result Date: 07/12/2019 CLINICAL DATA:  Dyspnea EXAM: PORTABLE CHEST 1 VIEW COMPARISON:  Two days ago FINDINGS: Retrocardiac opacity with volume loss, masslike appearance by CT and reportedly squamous cell carcinoma. Emphysema with hyperinflation of the right lung where there is interstitial coarsening. No visible residual pneumomediastinum. No pneumothorax is noted. IMPRESSION: 1. No progression from 2 days ago. Pneumo mediastinum is no  longer clearly seen. 2. Left  lower lobe mass and volume loss. 3. Emphysema. Electronically Signed   By: Monte Fantasia M.D.   On: 07/12/2019 09:06   DG Chest Port 1 View  Result Date: 06/25/2019 CLINICAL DATA:  Weakness, COVID-19 positive EXAM: PORTABLE CHEST 1 VIEW COMPARISON:  06/17/2019 FINDINGS: Chronic volume loss with consolidation in the left hemithorax, stable from prior. There may be mildly increased opacity within the aerated portion of the left lung. Heart size is stable. Mediastinal structures remain shifted to the left. There is hyperexpansion of the right lung field with chronic emphysematous changes and right apical scarring. No pneumothorax. IMPRESSION: Chronic volume loss with consolidation in the left hemithorax, stable from prior. There may be mildly increased opacity within the aerated portion of the left lung, which could reflect pneumonia given the patient's history. Electronically Signed   By: Davina Poke D.O.   On: 06/25/2019 13:31   DG C-Arm 1-60 Min-No Report  Result Date: 06/30/2019 Fluoroscopy was utilized by the requesting physician.  No radiographic interpretation.   ECHOCARDIOGRAM COMPLETE  Result Date: 06/26/2019   ECHOCARDIOGRAM REPORT   Patient Name:   OMERE MARTI Date of Exam: 06/26/2019 Medical Rec #:  [id number]       Height:       68.0 in Accession #:    [id number]      Weight:       92.2 lb Date of Birth:  Jul 13, 1940       BSA:          1.47 m Patient Age:    21 years        BP:           89/66 mmHg Patient Gender: M               HR:           129 bpm. Exam Location:  [facility] Procedure: 2D Echo Indications:     ATRIAL FIBRILLATION 427.31/I48.91  History:         Patient has no prior history of Echocardiogram examinations.                  COPD; Risk Factors:Hypertension.  Sonographer:     Avanell Shackleton Referring Phys:  [place] Diagnosing Phys: Ida Rogue MD  Sonographer Comments: Image acquisition challenging due to patient body habitus. IMPRESSIONS  1. Left ventricular  ejection fraction, by visual estimation, is 50 to 55%. The left ventricle has normal function. There is moderately increased left ventricular hypertrophy.  2. Left ventricular diastolic parameters are indeterminate.  3. The left ventricle has no regional wall motion abnormalities.  4. Global right ventricle has normal systolic function.The right ventricular size is normal. No increase in right ventricular wall thickness.  5. Left atrial size was normal.  6. Tricuspid valve regurgitation is mild-moderate.  7. Mildly elevated pulmonary artery systolic pressure.  8. Tachycardia noted/atrial flutter, rate 130 bpm FINDINGS  Left Ventricle: Left ventricular ejection fraction, by visual estimation, is 50 to 55%. The left ventricle has normal function. The left ventricle has no regional wall motion abnormalities. There is moderately increased left ventricular hypertrophy. Left ventricular diastolic parameters are indeterminate. Normal left atrial pressure. Right Ventricle: The right ventricular size is normal. No increase in right ventricular wall thickness. Global RV systolic function is has normal systolic function. The tricuspid regurgitant velocity is 2.90 m/s, and with an assumed right atrial pressure  of 5 mmHg, the estimated right ventricular systolic  pressure is mildly elevated at 38.6 mmHg. Left Atrium: Left atrial size was normal in size. Right Atrium: Right atrial size was normal in size Pericardium: There is no evidence of pericardial effusion. Mitral Valve: The mitral valve is normal in structure. Mild to moderate mitral valve regurgitation. No evidence of mitral valve stenosis by observation. Tricuspid Valve: The tricuspid valve is normal in structure. Tricuspid valve regurgitation is mild-moderate. Aortic Valve: The aortic valve is normal in structure. Aortic valve regurgitation is not visualized. Mild to moderate aortic valve sclerosis/calcification is present, without any evidence of aortic stenosis.  Pulmonic Valve: The pulmonic valve was normal in structure. Pulmonic valve regurgitation is not visualized. Pulmonic regurgitation is not visualized. Aorta: The aortic root, ascending aorta and aortic arch are all structurally normal, with no evidence of dilitation or obstruction. Venous: The inferior vena cava is normal in size with greater than 50% respiratory variability, suggesting right atrial pressure of 3 mmHg. IAS/Shunts: No atrial level shunt detected by color flow Doppler. There is no evidence of a patent foramen ovale. No ventricular septal defect is seen or detected. There is no evidence of an atrial septal defect.  LEFT VENTRICLE PLAX 2D LVIDd:         2.60 cm LVIDs:         2.26 cm LV PW:         0.68 cm LV IVS:        0.92 cm LVOT diam:     1.70 cm LV SV:         7 ml LV SV Index:   5.30 LVOT Area:     2.27 cm  LEFT ATRIUM             Index       RIGHT ATRIUM           Index LA diam:        2.70 cm 1.84 cm/m  RA Area:     16.00 cm LA Vol (A2C):   58.4 ml 39.72 ml/m RA Volume:   45.70 ml  31.08 ml/m LA Vol (A4C):   33.6 ml 22.85 ml/m LA Biplane Vol: 48.1 ml 32.71 ml/m   AORTA Ao Root diam: 3.30 cm TRICUSPID VALVE TR Peak grad:   33.6 mmHg TR Vmax:        305.00 cm/s  SHUNTS Systemic Diam: 1.70 cm  Ida Rogue MD Electronically signed by Ida Rogue MD Signature Date/Time: 06/26/2019/7:28:13 PM    Final    [place] EKG SITE RITE  Result Date: 06/26/2019 If Site Rite image not attached, placement could not be confirmed due to current cardiac rhythm.   Time Spent in minutes  35   Jestina Stephani M.D on 07/14/2019 at 4:46 PM  Between 7am to 7pm - Pager - [phone]  After 7pm go to [email or web address] - password [facility]  [facility] -  Office  [phone]

## 2019-07-15 DIAGNOSIS — J159 Unspecified bacterial pneumonia: Principal | ICD-10-CM

## 2019-07-15 MED ORDER — GUAIFENESIN-CODEINE 100-10 MG/5ML PO SOLN: 5.0000 mL | Freq: Four times a day (QID) | ORAL | Status: DC | PRN

## 2019-07-15 MED ORDER — DOXYCYCLINE HYCLATE 100 MG PO TABS: 100.0000 mg | ORAL_TABLET | Freq: Two times a day (BID) | ORAL | 0 refills | Status: AC

## 2019-07-15 MED ORDER — HYDROCODONE-ACETAMINOPHEN 5-325 MG PO TABS: 1.0000 | ORAL_TABLET | Freq: Four times a day (QID) | ORAL | 0 refills | Status: DC | PRN

## 2019-07-15 MED ORDER — MEGESTROL ACETATE 400 MG/10ML PO SUSP: 400.0000 mg | Freq: Every day | ORAL | 0 refills | Status: AC

## 2019-07-15 MED ORDER — GUAIFENESIN-CODEINE 100-10 MG/5ML PO SOLN: 5.0000 mL | Freq: Four times a day (QID) | ORAL | 0 refills | Status: AC | PRN

## 2019-07-15 NOTE — Progress Notes (Signed)
Pulmonary Medicine          Date: 07/15/2019,   MRN# [id number] Corey Carr 1940/07/09     AdmissionWeight: 43 kg                 CurrentWeight: 43 kg  Referring physician: Dr. Dwyane Dee    CHIEF COMPLAINT:   Failure to thrive associated with lung cancer   SUBJECTIVE   Patient is sitting up in bed.  He reports feeling well.  He is being discharged today and has follow up set up for pulmonary and oncology.   PAST MEDICAL HISTORY   Past Medical History:  Diagnosis Date  . Atrial flutter ([place])    CHADsVASc 3  . Hypertension      SURGICAL HISTORY   Past Surgical History:  Procedure Laterality Date  . KNEE SURGERY Right 1957  . VIDEO BRONCHOSCOPY WITH ENDOBRONCHIAL NAVIGATION N/A 06/30/2019   Procedure: VIDEO BRONCHOSCOPY WITH ENDOBRONCHIAL NAVIGATION;  Surgeon: Ottie Glazier, MD;  Location: [facility] ORS;  Service: Thoracic;  Laterality: N/A;  . VIDEO BRONCHOSCOPY WITH ENDOBRONCHIAL ULTRASOUND N/A 06/30/2019   Procedure: VIDEO BRONCHOSCOPY WITH ENDOBRONCHIAL ULTRASOUND;  Surgeon: Ottie Glazier, MD;  Location: [facility] ORS;  Service: Thoracic;  Laterality: N/A;     FAMILY HISTORY   Family History  Problem Relation Age of Onset  . Emphysema Father   . Lung cancer Maternal Uncle      SOCIAL HISTORY   Social History   Tobacco Use  . Smoking status: Former Smoker    Packs/day: 0.50    Years: 60.00    Pack years: 30.00    Types: Cigarettes    Quit date: 2015    Years since quitting: 6.1  . Smokeless tobacco: Never Used  Substance Use Topics  . Alcohol use: No  . Drug use: No     MEDICATIONS    Home Medication:    Current Medication:  Current Facility-Administered Medications:  .  0.9 %  sodium chloride infusion, , Intravenous, Continuous, Val Riles, MD, Last Rate: 50 mL/hr at 07/15/19 0929, New Bag at 07/15/19 0929 .  0.9 %  sodium chloride infusion, , Intravenous, PRN, Wyonia Hough, Audie Pinto, MD, Stopped at 07/12/19 1222 .   acetaminophen (TYLENOL) tablet 650 mg, 650 mg, Oral, Q6H PRN, 650 mg at 07/14/19 3235 **OR** acetaminophen (TYLENOL) suppository 650 mg, 650 mg, Rectal, Q6H PRN, Garba, Mohammad L, MD .  albuterol (PROVENTIL) (2.5 MG/3ML) 0.083% nebulizer solution 3 mL, 3 mL, Inhalation, See admin instructions, Gala Romney L, MD .  amiodarone (PACERONE) tablet 200 mg, 200 mg, Oral, BID, Garba, Mohammad L, MD, 200 mg at 07/15/19 0910 .  ascorbic acid (VITAMIN C) tablet 500 mg, 500 mg, Oral, Daily, Gala Romney L, MD, 500 mg at 07/15/19 0911 .  aspirin EC tablet 325 mg, 325 mg, Oral, Daily, Gala Romney L, MD, 325 mg at 07/15/19 0909 .  bisacodyl (DULCOLAX) EC tablet 10 mg, 10 mg, Oral, BID, Val Riles, MD, 10 mg at 07/15/19 0910 .  bisacodyl (DULCOLAX) suppository 10 mg, 10 mg, Rectal, Once, Val Riles, MD .  cefTRIAXone (ROCEPHIN) 1 g in sodium chloride 0.9 % 100 mL IVPB, 1 g, Intravenous, Q24H, Haidy Kackley, MD, Last Rate: 200 mL/hr at 07/15/19 0930, 1 g at 07/15/19 0930 .  dexamethasone (DECADRON) tablet 4 mg, 4 mg, Oral, Daily, Gala Romney L, MD, 4 mg at 07/15/19 0912 .  doxycycline (VIBRAMYCIN) 100 mg in sodium chloride 0.9 % 250 mL IVPB, 100 mg, Intravenous,  Q12H, Ottie Glazier, MD, Last Rate: 125 mL/hr at 07/15/19 1408, 100 mg at 07/15/19 1408 .  feeding supplement (ENSURE ENLIVE) (ENSURE ENLIVE) liquid 237 mL, 237 mL, Oral, TID BM, Garba, Mohammad L, MD, 237 mL at 07/15/19 1356 .  fluticasone (FLONASE) 50 MCG/ACT nasal spray 2 spray, 2 spray, Each Nare, Daily PRN, Garba, Mohammad L, MD .  fluticasone furoate-vilanterol (BREO ELLIPTA) 100-25 MCG/INH 1 puff, 1 puff, Inhalation, Daily, 1 puff at 07/15/19 0923 **AND** umeclidinium bromide (INCRUSE ELLIPTA) 62.5 MCG/INH 1 puff, 1 puff, Inhalation, Daily, Hallaji, Sheema M, RPH, 1 puff at 07/15/19 0923 .  guaiFENesin-codeine 100-10 MG/5ML solution 5 mL, 5 mL, Oral, Q6H PRN, Dhungel, Nishant, MD .  heparin injection 5,000 Units, 5,000 Units,  Subcutaneous, Q8H, Gala Romney L, MD, 5,000 Units at 07/15/19 1346 .  HYDROcodone-acetaminophen (NORCO/VICODIN) 5-325 MG per tablet 1 tablet, 1 tablet, Oral, Q6H PRN, Elwyn Reach, MD, 1 tablet at 07/15/19 1431 .  loratadine (CLARITIN) tablet 10 mg, 10 mg, Oral, Daily, Jonelle Sidle, Mohammad L, MD, 10 mg at 07/15/19 0910 .  megestrol (MEGACE) 400 MG/10ML suspension 400 mg, 400 mg, Oral, Daily, Lanney Gins, Williard Keller, MD, 400 mg at 07/15/19 6734 .  multivitamin with minerals tablet 1 tablet, 1 tablet, Oral, Daily, Val Riles, MD, 1 tablet at 07/15/19 0910 .  omega-3 acid ethyl esters (LOVAZA) capsule 1 g, 1 g, Oral, Daily, Gala Romney L, MD, 1 g at 07/15/19 0909 .  ondansetron (ZOFRAN) tablet 4 mg, 4 mg, Oral, Q6H PRN **OR** ondansetron (ZOFRAN) injection 4 mg, 4 mg, Intravenous, Q6H PRN, Garba, Mohammad L, MD .  pantoprazole (PROTONIX) EC tablet 40 mg, 40 mg, Oral, Daily, Jonelle Sidle, Mohammad L, MD, 40 mg at 07/15/19 0911 .  polyethylene glycol (MIRALAX / GLYCOLAX) packet 17 g, 17 g, Oral, BID, Jonelle Sidle, Mohammad L, MD, 17 g at 07/15/19 0914 .  vitamin B-12 (CYANOCOBALAMIN) tablet 500 mcg, 500 mcg, Oral, Daily, Gala Romney L, MD, 500 mcg at 07/15/19 0912 .  vitamin E capsule 400 Units, 400 Units, Oral, Daily, Elwyn Reach, MD, 400 Units at 07/15/19 1937    ALLERGIES   Patient has no known allergies.     REVIEW OF SYSTEMS    Review of Systems:  Gen:  Denies  fever, sweats, chills weigh loss  HEENT: Denies blurred vision, double vision, ear pain, eye pain, hearing loss, nose bleeds, sore throat Cardiac:  No dizziness, chest pain or heaviness, chest tightness,edema Resp:   Denies cough or sputum porduction, shortness of breath,wheezing, hemoptysis,  Gi: Denies swallowing difficulty, stomach pain, nausea or vomiting, diarrhea, constipation, bowel incontinence Gu:  Denies bladder incontinence, burning urine Ext:   Denies Joint pain, stiffness or swelling Skin: Denies  skin rash, easy  bruising or bleeding or hives Endoc:  Denies polyuria, polydipsia , polyphagia or weight change Psych:   Denies depression, insomnia or hallucinations   Other:  All other systems negative   VS: BP 136/70 (BP Location: Right Arm)   Pulse 70   Temp 98.7 F (37.1 C) (Oral)   Resp 20   Ht 5\' 8"  (1.727 m)   Wt 43 kg   SpO2 100%   BMI 14.41 kg/m      PHYSICAL EXAM    GENERAL:NAD, no fevers, chills, no weakness no fatigue HEAD: Normocephalic, atraumatic.  EYES: Pupils equal, round, reactive to light. Extraocular muscles intact. No scleral icterus.  MOUTH: Moist mucosal membrane. Dentition intact. No abscess noted.  EAR, NOSE, THROAT: Clear without exudates. No external lesions.  NECK: Supple. No thyromegaly. No nodules. No JVD.  PULMONARY: Decreased breath sounds bilaterally worse at the left lower lung zone CARDIOVASCULAR: S1 and S2. Regular rate and rhythm. No murmurs, rubs, or gallops. No edema. Pedal pulses 2+ bilaterally.  GASTROINTESTINAL: Soft, nontender, nondistended. No masses. Positive bowel sounds. No hepatosplenomegaly.  MUSCULOSKELETAL: No swelling, clubbing, or edema. Range of motion full in all extremities.  NEUROLOGIC: Cranial nerves II through XII are intact. No gross focal neurological deficits. Sensation intact. Reflexes intact.  SKIN: No ulceration, lesions, rashes, or cyanosis. Skin warm and dry. Turgor intact.  PSYCHIATRIC: Mood, affect within normal limits. The patient is awake, alert and oriented x 3. Insight, judgment intact.       IMAGING    DG Chest 1 View  Result Date: 07/10/2019 CLINICAL DATA:  Pneumomediastinum. Additional history provided: History of hypertension. Former smoker. EXAM: CHEST  1 VIEW COMPARISON:  CT angiogram chest 07/08/2019, chest radiograph 06/25/2019 FINDINGS: The cardiomediastinal silhouette is unchanged, although largely obscured. Persistent curvilinear opacity projecting along the lateral aspect of the descending thoracic  aorta consistent with previously demonstrated pneumomediastinum. Unchanged masslike consolidation within the left lower lung with chronic elevation of the left hemidiaphragm. A left pleural effusion is difficult to exclude. Redemonstrated background emphysematous change with biapical pleuroparenchymal scarring. Chronically increased interstitial markings within the aerated portions of the left lung. A right lower lobe pulmonary nodule was better appreciated on recent prior CT. No evidence of pneumothorax. No acute bony abnormality. IMPRESSION: 1. Similar appearance of pneumomediastinum as compared to chest radiograph 06/25/2019. However, progression of pneumomediastinum was noted on prior CT 07/08/2019. 2. Unchanged masslike consolidation within the left lower lung with chronic elevation of the left hemidiaphragm. 3. Redemonstrated background bullous emphysema with biapical pleuroparenchymal scarring. 4. A right lower lobe pulmonary nodule was better appreciated on recent prior CT. Electronically Signed   By: Kellie Simmering DO   On: 07/10/2019 11:21   DG Chest 2 View  Result Date: 06/17/2019 CLINICAL DATA:  Dizziness and shortness of breath. EXAM: CHEST - 2 VIEW COMPARISON:  05/23/2019. FINDINGS: Similar appearance of volume loss in the left hemithorax with elevation of the left hemidiaphragm and left base collapse/consolidative opacity. There is diffuse interstitial and pleural opacity in the left chest. Right lung is hyperexpanded without focal consolidation or pleural effusion. Bones are diffusely demineralized. IMPRESSION: Largely stable exam. Marked volume loss left hemithorax with posterior collapse/consolidative change in diffuse pleural thickening. Electronically Signed   By: Misty Stanley M.D.   On: 06/17/2019 16:43   CT Head Wo Contrast  Result Date: 07/08/2019 CLINICAL DATA:  Lung cancer. Assessment for hemorrhage in the setting of possible need for anticoagulation. EXAM: CT HEAD WITHOUT CONTRAST  TECHNIQUE: Contiguous axial images were obtained from the base of the skull through the vertex without intravenous contrast. COMPARISON:  None. FINDINGS: Brain: There is no mass, hemorrhage or extra-axial collection. There is generalized atrophy without lobar predilection. Hypodensity of the white matter is most commonly associated with chronic microvascular disease. Vascular: No abnormal hyperdensity of the major intracranial arteries or dural venous sinuses. No intracranial atherosclerosis. Skull: The visualized skull base, calvarium and extracranial soft tissues are normal. Sinuses/Orbits: No fluid levels or advanced mucosal thickening of the visualized paranasal sinuses. No mastoid or middle ear effusion. The orbits are normal. IMPRESSION: 1. No hemorrhage or other acute abnormality. 2. Chronic ischemic microangiopathy and generalized volume loss. Electronically Signed   By: Ulyses Jarred M.D.   On: 07/08/2019 19:37   CT Angio Chest  PE W and/or Wo Contrast  Result Date: 07/08/2019 CLINICAL DATA:  79 year old male with shortness of breath, chronic oxygen. Currently being evaluated for possible left lower lobe mass. EXAM: CT ANGIOGRAPHY CHEST WITH CONTRAST TECHNIQUE: Multidetector CT imaging of the chest was performed using the standard protocol during bolus administration of intravenous contrast. Multiplanar CT image reconstructions and MIPs were obtained to evaluate the vascular anatomy. CONTRAST:  78mL OMNIPAQUE IOHEXOL 350 MG/ML SOLN COMPARISON:  CTA chest and CT Abdomen and Pelvis 06/17/2019. FINDINGS: Cardiovascular: Good contrast bolus timing in the pulmonary arterial tree. Pulmonary artery patency in the left lung appears stable since last month, with obliterated left lower lobe pulmonary arteries. No central or hilar pulmonary embolus. The right lung pulmonary arteries appear patent and within normal limits. No aortic contrast today. Calcified aortic atherosclerosis. The heart remain shifted over to  the left. No cardiomegaly or pericardial effusion is evident. Mediastinum/Nodes: Increased pneumomediastinum since 06/17/2019, gas now tracking into the bilateral lower neck and also toward the core of the diaphragm. The left hilum is indistinct as before, but elsewhere no mediastinal or right hilar lymphadenopathy is identified. Lungs/Pleura: The left lower lobe bronchus more abruptly terminates now on series 7, image 44 and there is no residual more distal left lower lobe airway pneumatization as there was last month. Complete masslike opacification of the left lower lobe otherwise appears stable. Superimposed bullous emphysema, left upper lobe and right lung base predominant scarring. An indeterminate superior segment right lower lobe 8-9 mm oval nodule is stable from last month, new since 03/05/2020. No pleural effusion or new right lung opacity. Upper Abdomen: Elevation of the left hemidiaphragm again suspected. The stomach and splenic flexure are visible on series 5, image 75. There is stool mixed with oral contrast or other dense material in the colon. Evidence of chronic calcific pancreatitis. Grossly negative visible noncontrast liver, kidneys and adrenal glands. Musculoskeletal: Osteopenia. No acute or suspicious osseous lesion identified. Review of the MIP images confirms the above findings. IMPRESSION: 1. Stable pulmonary arteries, obliterated in the left lower lung but with no evidence of acute PE. 2. Continued mass-like opacification of the left lower lung with worsening abrupt termination of the lower lobe bronchus again highly suspicious for tumor. There is superimposed chronic elevation of the left hemidiaphragm. There is increased pneumomediastinum, now with subcutaneous gas tracking into the lower neck. It appears a PET-CT was scheduled for 06/25/2019 but did not occur. 3. Underlying bullous Emphysema (ICD10-J43.9). Aortic Atherosclerosis (ICD10-I70.0). Electronically Signed   By: Genevie Ann M.D.    On: 07/08/2019 19:36   CT Angio Chest PE W/Cm &/Or Wo Cm  Result Date: 06/17/2019 CLINICAL DATA:  Shortness of breath EXAM: CT ANGIOGRAPHY CHEST WITH CONTRAST TECHNIQUE: Multidetector CT imaging of the chest was performed using the standard protocol during bolus administration of intravenous contrast. Multiplanar CT image reconstructions and MIPs were obtained to evaluate the vascular anatomy. CONTRAST:  55mL OMNIPAQUE IOHEXOL 350 MG/ML SOLN COMPARISON:  03/06/2019 FINDINGS: Cardiovascular: No filling defects in the pulmonary arteries to suggest pulmonary emboli. Heart is normal size. Aorta normal caliber. Aortic and coronary artery atherosclerosis. Mediastinum/Nodes: There is pneumomediastinum. Gas seen around the main pulmonary artery and in the posterior mediastinum. No adenopathy. Lungs/Pleura: Volume loss on the left with elevation of the left hemidiaphragm. Findings are chronic. There is a chronic masslike area of consolidation in the left lower lobe which is similar to prior study. Advanced centrilobular emphysema and areas of scarring in both lungs. Possible small left effusion,  stable. Upper Abdomen: Imaging into the upper abdomen shows no acute findings. Musculoskeletal: Cachexia.  No acute bony abnormality. Review of the MIP images confirms the above findings. IMPRESSION: Pneumomediastinum seen in the middle and posterior mediastinum. No associated pneumothorax. Advanced centrilobular emphysema. Stable chronic volume loss on the left with elevation of the left hemidiaphragm and masslike consolidation in the left lower lobe. As recommended on prior study, bronchoscopy with attention to the left lower lobe and/or PET CT may be helpful to exclude left lower lobe mass lesion. Small right pleural effusion. Areas of scarring in the lungs bilaterally. Aortic Atherosclerosis (ICD10-I70.0) and Emphysema (ICD10-J43.9). Electronically Signed   By: Rolm Baptise M.D.   On: 06/17/2019 21:12   CT Abdomen Pelvis W  Contrast  Result Date: 06/17/2019 CLINICAL DATA:  Hypercalcemia. EXAM: CT ABDOMEN AND PELVIS WITH CONTRAST TECHNIQUE: Multidetector CT imaging of the abdomen and pelvis was performed using the standard protocol following bolus administration of intravenous contrast. CONTRAST:  42mL OMNIPAQUE IOHEXOL 350 MG/ML SOLN COMPARISON:  None. FINDINGS: Lower chest: Masslike opacity seen in the left lower lobe as described on prior chest CT. Trace left pleural effusion. Volume loss on the left with elevation of the left hemidiaphragm. Advanced emphysema. Scarring in the right lower lobe. Hepatobiliary: No focal hepatic abnormality. Gallbladder unremarkable. Pancreas: Calcifications throughout the pancreas compatible with chronic pancreatitis. No evidence of acute pancreatitis, fall suspicious pancreatic lesion or pancreatic ductal dilatation. Spleen: No focal abnormality.  Normal size. Adrenals/Urinary Tract: Small cysts in the left kidney. No hydronephrosis. Adrenal glands and urinary bladder unremarkable. Stomach/Bowel: Large stool burden throughout the colon. Distention of the rectum with stool concerning for fecal impaction. Stomach and small bowel decompressed, unremarkable. Vascular/Lymphatic: Aortic atherosclerosis. No enlarged abdominal or pelvic lymph nodes. Reproductive: Prostate prominence. Other: No free fluid or free air. Musculoskeletal: Cachexia. No acute bony abnormality. Degenerative disc and facet disease in the lower lumbar spine. IMPRESSION: Masslike opacity in the left lower lobe as described on chest CT concerning for possible pulmonary mass lesions/malignancy. This could be further evaluated with bronchoscopy and/or PET CT as described on prior chest CT. Advanced emphysema. Large stool burden throughout the colon. Appearance is concerning for fecal impaction. Chronic pancreatitis changes.  No evidence of acute pancreatitis. Aortic atherosclerosis. Electronically Signed   By: Rolm Baptise M.D.   On:  06/17/2019 21:16   DG Chest Port 1 View  Result Date: 07/12/2019 CLINICAL DATA:  Dyspnea EXAM: PORTABLE CHEST 1 VIEW COMPARISON:  Two days ago FINDINGS: Retrocardiac opacity with volume loss, masslike appearance by CT and reportedly squamous cell carcinoma. Emphysema with hyperinflation of the right lung where there is interstitial coarsening. No visible residual pneumomediastinum. No pneumothorax is noted. IMPRESSION: 1. No progression from 2 days ago. Pneumo mediastinum is no longer clearly seen. 2. Left lower lobe mass and volume loss. 3. Emphysema. Electronically Signed   By: Monte Fantasia M.D.   On: 07/12/2019 09:06   DG Chest Port 1 View  Result Date: 06/25/2019 CLINICAL DATA:  Weakness, COVID-19 positive EXAM: PORTABLE CHEST 1 VIEW COMPARISON:  06/17/2019 FINDINGS: Chronic volume loss with consolidation in the left hemithorax, stable from prior. There may be mildly increased opacity within the aerated portion of the left lung. Heart size is stable. Mediastinal structures remain shifted to the left. There is hyperexpansion of the right lung field with chronic emphysematous changes and right apical scarring. No pneumothorax. IMPRESSION: Chronic volume loss with consolidation in the left hemithorax, stable from prior. There may be mildly increased  opacity within the aerated portion of the left lung, which could reflect pneumonia given the patient's history. Electronically Signed   By: Davina Poke D.O.   On: 06/25/2019 13:31   DG C-Arm 1-60 Min-No Report  Result Date: 06/30/2019 Fluoroscopy was utilized by the requesting physician.  No radiographic interpretation.   ECHOCARDIOGRAM COMPLETE  Result Date: 06/26/2019   ECHOCARDIOGRAM REPORT   Patient Name:   Corey Carr Date of Exam: 06/26/2019 Medical Rec #:  [id number]       Height:       68.0 in Accession #:    [id number]      Weight:       92.2 lb Date of Birth:  03/14/1941       BSA:          1.47 m Patient Age:    79 years        BP:            89/66 mmHg Patient Gender: M               HR:           129 bpm. Exam Location:  [facility] Procedure: 2D Echo Indications:     ATRIAL FIBRILLATION 427.31/I48.91  History:         Patient has no prior history of Echocardiogram examinations.                  COPD; Risk Factors:Hypertension.  Sonographer:     Avanell Shackleton Referring Phys:  [place] Diagnosing Phys: Ida Rogue MD  Sonographer Comments: Image acquisition challenging due to patient body habitus. IMPRESSIONS  1. Left ventricular ejection fraction, by visual estimation, is 50 to 55%. The left ventricle has normal function. There is moderately increased left ventricular hypertrophy.  2. Left ventricular diastolic parameters are indeterminate.  3. The left ventricle has no regional wall motion abnormalities.  4. Global right ventricle has normal systolic function.The right ventricular size is normal. No increase in right ventricular wall thickness.  5. Left atrial size was normal.  6. Tricuspid valve regurgitation is mild-moderate.  7. Mildly elevated pulmonary artery systolic pressure.  8. Tachycardia noted/atrial flutter, rate 130 bpm FINDINGS  Left Ventricle: Left ventricular ejection fraction, by visual estimation, is 50 to 55%. The left ventricle has normal function. The left ventricle has no regional wall motion abnormalities. There is moderately increased left ventricular hypertrophy. Left ventricular diastolic parameters are indeterminate. Normal left atrial pressure. Right Ventricle: The right ventricular size is normal. No increase in right ventricular wall thickness. Global RV systolic function is has normal systolic function. The tricuspid regurgitant velocity is 2.90 m/s, and with an assumed right atrial pressure  of 5 mmHg, the estimated right ventricular systolic pressure is mildly elevated at 38.6 mmHg. Left Atrium: Left atrial size was normal in size. Right Atrium: Right atrial size was normal in size Pericardium: There is no  evidence of pericardial effusion. Mitral Valve: The mitral valve is normal in structure. Mild to moderate mitral valve regurgitation. No evidence of mitral valve stenosis by observation. Tricuspid Valve: The tricuspid valve is normal in structure. Tricuspid valve regurgitation is mild-moderate. Aortic Valve: The aortic valve is normal in structure. Aortic valve regurgitation is not visualized. Mild to moderate aortic valve sclerosis/calcification is present, without any evidence of aortic stenosis. Pulmonic Valve: The pulmonic valve was normal in structure. Pulmonic valve regurgitation is not visualized. Pulmonic regurgitation is not visualized. Aorta: The aortic root, ascending aorta and  aortic arch are all structurally normal, with no evidence of dilitation or obstruction. Venous: The inferior vena cava is normal in size with greater than 50% respiratory variability, suggesting right atrial pressure of 3 mmHg. IAS/Shunts: No atrial level shunt detected by color flow Doppler. There is no evidence of a patent foramen ovale. No ventricular septal defect is seen or detected. There is no evidence of an atrial septal defect.  LEFT VENTRICLE PLAX 2D LVIDd:         2.60 cm LVIDs:         2.26 cm LV PW:         0.68 cm LV IVS:        0.92 cm LVOT diam:     1.70 cm LV SV:         7 ml LV SV Index:   5.30 LVOT Area:     2.27 cm  LEFT ATRIUM             Index       RIGHT ATRIUM           Index LA diam:        2.70 cm 1.84 cm/m  RA Area:     16.00 cm LA Vol (A2C):   58.4 ml 39.72 ml/m RA Volume:   45.70 ml  31.08 ml/m LA Vol (A4C):   33.6 ml 22.85 ml/m LA Biplane Vol: 48.1 ml 32.71 ml/m   AORTA Ao Root diam: 3.30 cm TRICUSPID VALVE TR Peak grad:   33.6 mmHg TR Vmax:        305.00 cm/s  SHUNTS Systemic Diam: 1.70 cm  Ida Rogue MD Electronically signed by Ida Rogue MD Signature Date/Time: 06/26/2019/7:28:13 PM    Final    [place] EKG SITE RITE  Result Date: 06/26/2019 If Site Rite image not attached, placement could  not be confirmed due to current cardiac rhythm.     ASSESSMENT/PLAN    Acute on chronic hypoxemic respiratory failure -Due to severe bullous emphysema with COPD as well as left lower lobe postobstructive cancer-related process with atelectasis -Recommend incentive spirometry and chest physiotherapy - Empiric community-acquired pneumonia Therapy-patient with elevated WBC unlikely from steroids alone -We will place on Rocephin and Zithromax IV -Agree with dexamethasone 4 mg once daily-d/c with short pred taper     Left lower lobe squamous cell lung CA-stage III-IV -Status post tissue diagnosis-oncology on case Dr. Grayland Ormond appreciate input -There is plan for possible immunotherapy based on patient's condition and performance status -Has had recurrent admissions for worsening respiratory status -Appreciate palliative care recommendations -Appreciate RD dietary consultation with increased Megace dose patient is eating well this morning   Pneumomediastinum - Supportive care -avoid positive pressure ventilation if possible -narcotic cough suppresant to minimize forceful cough with goal to decrease shearing forces and allow escaped air to resorb -Chest physiotherapy with percussor and vest -incentive spirometer at bedside please encourage to use    Cancer associated Cachexia (CAC)   - patient reports being dehydrated at home, states he has poor appetite, he is on appropriate Megace dosing now   - dietary evaluation with RD consultation    - Severe Protein calorie malnutrition - overall poor prognosis   - PT/OT     Thank you for allowing me to participate in the care of this patient.   Patient/Family are satisfied with care plan and all questions have been answered.   This document was prepared using Dragon voice recognition software and may include unintentional dictation  errors.     Ottie Glazier, M.D.  Division of [place]

## 2019-07-15 NOTE — Progress Notes (Signed)
Occupational Therapy Treatment Patient Details Name: Corey Carr MRN: [id number] DOB: 27-Dec-1940 Today's Date: 07/15/2019    History of present illness Corey Carr is a 64yoM who comes [facility] 07/08/19 after acute onset worseing weakness and SOB. Pt is familiar to our services from prior admissions. 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 Pt has continued to have advancing cachexia over th epast several weaks.   OT comments  Patient seen this date for OT session.  Patient states that he is exhausted and just completed physical therapy.  BP at 139/81 and 75bpm.  Patient agreeable to perform simple exercises while in bed.  Educated patient in breathing/core/lung capacity exercises involving B UEs and diaphragmatic breathing.  Patient required MOD cues for correct posture and breathing technique.  Also educated patient in ankle pumps while supine in bed.  Patient with no increased pain or discomfort.  Tolerated well.  The patient would benefit from continued skilled occupational therapy to address activity tolerance, strengthening, sitting/standing tolerance, ADL retraining and energy conservation techniques.  Based on today's performance, d/c recommendation remains appropriate.  Follow Up Recommendations       Equipment Recommendations       Recommendations for Other Services      Precautions / Restrictions Precautions Precautions: Fall Precaution Comments: High Fall Restrictions Weight Bearing Restrictions: No       Mobility Bed Mobility                  Transfers                      Balance                                           ADL either performed or assessed with clinical judgement   ADL Overall ADL's : Needs assistance/impaired     Grooming: Wash/dry hands;Wash/dry face;Bed level;Minimal assistance                                 General ADL Comments: Patient  disagreeable to perform ADLs out of bed secondary to "just taking a long walk"     Vision       Perception     Praxis      Cognition Arousal/Alertness: Awake/alert Behavior During Therapy: WFL for tasks assessed/performed Overall Cognitive Status: Within Functional Limits for tasks assessed                                          Exercises Other Exercises Other Exercises: Educated patient in breathing techniques/UE AROM to improve lung capacity and activity tolerance Other Exercises: Educated patient in ankle pumps while in bed   Shoulder Instructions       General Comments      Pertinent Vitals/ Pain       Pain Assessment: 0-10 Pain Score: 2  Pain Location: Patient noted discomfort in chest Pain Intervention(s): Monitored during session  [place] expects to be discharged to:: Private residence Living Arrangements: Spouse/significant other Available Help at Discharge: Family;Available 24 hours/day Type of Home: Mobile home Home Access: Stairs to enter Entrance Stairs-Number of Steps: 5 Entrance Stairs-Rails: Left;Right Home Layout: One  level     Bathroom Shower/Tub: Occupational psychologist: Standard     Home Equipment: Environmental consultant - 2 wheels;Walker - 4 wheels;Cane - single point;Toilet riser;Grab bars - tub/shower          Prior Functioning/Environment Level of Independence: Independent with assistive device(s)        Comments: Per pt continues to mobilize in the home with RW fairly well despite cachexia and ARF.   Frequency  Min 2X/week        Progress Toward Goals  OT Goals(current goals can now be found in the care plan section)  Progress towards OT goals: OT to reassess next treatment     Plan      Co-evaluation                 AM-PAC OT "6 Clicks" Daily Activity     Outcome Measure                    End of Session    OT Visit Diagnosis: Other abnormalities of gait and  mobility (R26.89);Muscle weakness (generalized) (M62.81)   Activity Tolerance Patient tolerated treatment well;Patient limited by fatigue   Patient Left in bed;with call bell/phone within reach;with bed alarm set   Nurse Communication          Time: 8309-4076 OT Time Calculation (min): 20 min  Charges: OT General Charges $OT Visit: 1 Visit OT Treatments $Therapeutic Exercise: 8-22 mins  Baldomero Lamy, MS, OTR/L 07/15/19, 11:07 AM

## 2019-07-15 NOTE — Discharge Summary (Addendum)
Physician Discharge Summary  Corey Carr IFO:[id number] DOB: 1940/09/09 DOA: 07/08/2019  PCP: [name], MD  Admit date: 07/08/2019 Discharge date: 07/15/2019  Admitted From: Home Disposition: Home  Recommendations for Outpatient Follow-up:  1. Follow up with PCP in 1-2 weeks 2. Patient will complete 5 days of antibiotic course after 2/25. 3. Follow-up with oncology in 2 weeks.  Home Health: RN and PT Equipment/Devices: Chronic 3 L O2 via nasal cannula.  Discharge Condition: Guarded CODE STATUS: DNR Diet recommendation: Regular   Discharge Diagnoses:  Principal Problem:   Healthcare associated bacterial pneumonia   Active Problems:   Generalized weakness   Failure to thrive (0-17)   Squamous cell carcinoma of left lung (HCC)   Essential hypertension   Vitamin B12 deficiency   Dehydration   Acute kidney injury (HCC)   Chronic obstructive pulmonary disease (HCC)   Acute metabolic encephalopathy   SIRS (systemic inflammatory response syndrome) (HCC)   AF (paroxysmal atrial fibrillation) (HCC)   Acute on chronic respiratory failure with hypoxia (HCC)   Palliative care encounter   Brief narrative/HPI 79 year old male with history of recently diagnosed lung cancer, paroxysmal A. fib/flutter, COPD with chronic respiratory failure on 3 L O2, history of COVID-19 pneumonia in 12/20, hypertension who presented with progressive shortness of breath.  Also having increasing weakness with failure to thrive.  During his last hospitalization he had pneumomediastinum. On admission CT angiogram of the chest showed stable pulmonary arteries with continuous masslike opacification of the left lower lung with worsened chronic elevation of the left hemidiaphragm, increased pneumomediastinum with subcutaneous gas tracking into the lower neck.    Hospital course  Principal Problem: Acute on chronic respiratory failure with hypoxia Has severe emphysema with mild COPD exacerbation  and lobar pneumonia (HCAP). Received empiric IV Rocephin and doxycycline.  Added antitussives.  Continue Decadron 4 mg daily.  Pulmonary consult appreciated.  Recommends incentive spirometry and chest PT. Continue O2 via nasal cannula, as needed nebs.  Active Problems: Left lower lobe squamous cell lung cancer (stage III-IV) Plan on immunotherapy per oncology.  Not sure if patient will tolerate due to severe deconditioning.   Pneumomediastinum Chest PT, IS, narcotic antitussive added.  Failure to thrive with severe protein calorie malnutrition/malignant cachexia Nutrition consult appreciated.  PT recommends home health.  Palliative care consult appreciated.  Upon the discussion wife understands patient's failure to thrive but would like to treat the treatable and attempt treatment for his cancer if possible. Increased Megace dose.  Continue nutrition supplement.  Hypomagnesemia/hypophosphatemia Replenished.  Anemia of chronic disease H&H stable.  Paroxysmal A. fib Stable.  Continue amiodarone.     Family Communication  : Updated wife on the phone  Disposition Plan  :  Home with home health   Consults  : Pulmonary, palliative care  Procedures  : CT angiogram of the chest, CT head  Discharge Instructions   Allergies as of 07/15/2019   No Known Allergies     Medication List    STOP taking these medications   cefdinir 300 MG capsule Commonly known as: OMNICEF   megestrol 40 MG tablet Commonly known as: MEGACE Replaced by: megestrol 400 MG/10ML suspension     TAKE these medications   albuterol 108 (90 Base) MCG/ACT inhaler Commonly known as: VENTOLIN HFA Inhale 2 puffs into the lungs See admin instructions. Inhale 2 puffs into the lungs every 3 hours as needed for wheezing.   amiodarone 200 MG tablet Commonly known as: PACERONE Take 1 tablet (200 mg total)  by mouth 2 (two) times daily.   aspirin 325 MG tablet Take 1 tablet by mouth daily.    [employer] Glycerin Adult 2 g suppository Generic drug: glycerin adult SMARTSIG:1 SUPPOS Rectally As Needed   dexamethasone 4 MG tablet Commonly known as: DECADRON Take 1 tablet (4 mg total) by mouth daily.   doxycycline 100 MG tablet Commonly known as: VIBRA-TABS Take 1 tablet (100 mg total) by mouth 2 (two) times daily for 2 days.   esomeprazole 20 MG capsule Commonly known as: NEXIUM Take 20 mg by mouth daily at 12 noon.   feeding supplement (ENSURE ENLIVE) Liqd Take 237 mLs by mouth 3 (three) times daily between meals.   fluticasone 50 MCG/ACT nasal spray Commonly known as: FLONASE Place 2 sprays into both nostrils daily as needed for allergies or rhinitis.   HYDROcodone-acetaminophen 5-325 MG tablet Commonly known as: NORCO/VICODIN Take 1 tablet by mouth every 6 (six) hours as needed.   loratadine 10 MG tablet Commonly known as: Allergy Relief Take 1 tablet (10 mg total) by mouth daily.   megestrol 400 MG/10ML suspension Commonly known as: MEGACE Take 10 mLs (400 mg total) by mouth daily. Replaces: megestrol 40 MG tablet   omega-3 acid ethyl esters 1 g capsule Commonly known as: LOVAZA Take 1 g by mouth daily.   polyethylene glycol powder 17 GM/SCOOP powder Commonly known as: GLYCOLAX/MIRALAX Take 17 g by mouth 2 (two) times daily. Mix in 4-8 ounces of fluid prior to taking.   senna-docusate 8.6-50 MG tablet Commonly known as: Senokot-S Take 1 tablet by mouth at bedtime as needed for mild constipation.   Trelegy Ellipta 100-62.5-25 MCG/INH Aepb Generic drug: Fluticasone-Umeclidin-Vilant Inhale 1 puff into the lungs daily.   vitamin B-12 500 MCG tablet Commonly known as: CYANOCOBALAMIN Take 500 mcg by mouth daily.   vitamin C 250 MG tablet Commonly known as: ASCORBIC ACID Take 500 mg by mouth daily.   vitamin E 180 MG (400 UNITS) capsule Generic drug: vitamin E Take 400 Units by mouth daily.      Follow-up Information    [name], MD. Schedule  an appointment as soon as possible for a visit.   Specialty: Family Medicine Why: Please schedule appointment with PCP for within 3-5 days of discharge date. Contact information: [address] [place] [address] [phone]        Lloyd Huger, MD. Schedule an appointment as soon as possible for a visit in 2 week(s).   Specialty: Oncology Contact information: [place] [address] [phone]          No Known Allergies   Procedures/Studies: DG Chest 1 View  Result Date: 07/10/2019 CLINICAL DATA:  Pneumomediastinum. Additional history provided: History of hypertension. Former smoker. EXAM: CHEST  1 VIEW COMPARISON:  CT angiogram chest 07/08/2019, chest radiograph 06/25/2019 FINDINGS: The cardiomediastinal silhouette is unchanged, although largely obscured. Persistent curvilinear opacity projecting along the lateral aspect of the descending thoracic aorta consistent with previously demonstrated pneumomediastinum. Unchanged masslike consolidation within the left lower lung with chronic elevation of the left hemidiaphragm. A left pleural effusion is difficult to exclude. Redemonstrated background emphysematous change with biapical pleuroparenchymal scarring. Chronically increased interstitial markings within the aerated portions of the left lung. A right lower lobe pulmonary nodule was better appreciated on recent prior CT. No evidence of pneumothorax. No acute bony abnormality. IMPRESSION: 1. Similar appearance of pneumomediastinum as compared to chest radiograph 06/25/2019. However, progression of pneumomediastinum was noted on prior CT 07/08/2019.  2. Unchanged masslike consolidation within the left lower lung with chronic elevation of the left hemidiaphragm. 3. Redemonstrated background bullous emphysema with biapical pleuroparenchymal scarring. 4. A right lower lobe pulmonary nodule was better appreciated on recent prior CT. Electronically Signed   By:  Kellie Simmering DO   On: 07/10/2019 11:21   DG Chest 2 View  Result Date: 06/17/2019 CLINICAL DATA:  Dizziness and shortness of breath. EXAM: CHEST - 2 VIEW COMPARISON:  05/23/2019. FINDINGS: Similar appearance of volume loss in the left hemithorax with elevation of the left hemidiaphragm and left base collapse/consolidative opacity. There is diffuse interstitial and pleural opacity in the left chest. Right lung is hyperexpanded without focal consolidation or pleural effusion. Bones are diffusely demineralized. IMPRESSION: Largely stable exam. Marked volume loss left hemithorax with posterior collapse/consolidative change in diffuse pleural thickening. Electronically Signed   By: Misty Stanley M.D.   On: 06/17/2019 16:43   CT Head Wo Contrast  Result Date: 07/08/2019 CLINICAL DATA:  Lung cancer. Assessment for hemorrhage in the setting of possible need for anticoagulation. EXAM: CT HEAD WITHOUT CONTRAST TECHNIQUE: Contiguous axial images were obtained from the base of the skull through the vertex without intravenous contrast. COMPARISON:  None. FINDINGS: Brain: There is no mass, hemorrhage or extra-axial collection. There is generalized atrophy without lobar predilection. Hypodensity of the white matter is most commonly associated with chronic microvascular disease. Vascular: No abnormal hyperdensity of the major intracranial arteries or dural venous sinuses. No intracranial atherosclerosis. Skull: The visualized skull base, calvarium and extracranial soft tissues are normal. Sinuses/Orbits: No fluid levels or advanced mucosal thickening of the visualized paranasal sinuses. No mastoid or middle ear effusion. The orbits are normal. IMPRESSION: 1. No hemorrhage or other acute abnormality. 2. Chronic ischemic microangiopathy and generalized volume loss. Electronically Signed   By: Ulyses Jarred M.D.   On: 07/08/2019 19:37   CT Angio Chest PE W and/or Wo Contrast  Result Date: 07/08/2019 CLINICAL DATA:   79 year old male with shortness of breath, chronic oxygen. Currently being evaluated for possible left lower lobe mass. EXAM: CT ANGIOGRAPHY CHEST WITH CONTRAST TECHNIQUE: Multidetector CT imaging of the chest was performed using the standard protocol during bolus administration of intravenous contrast. Multiplanar CT image reconstructions and MIPs were obtained to evaluate the vascular anatomy. CONTRAST:  20mL OMNIPAQUE IOHEXOL 350 MG/ML SOLN COMPARISON:  CTA chest and CT Abdomen and Pelvis 06/17/2019. FINDINGS: Cardiovascular: Good contrast bolus timing in the pulmonary arterial tree. Pulmonary artery patency in the left lung appears stable since last month, with obliterated left lower lobe pulmonary arteries. No central or hilar pulmonary embolus. The right lung pulmonary arteries appear patent and within normal limits. No aortic contrast today. Calcified aortic atherosclerosis. The heart remain shifted over to the left. No cardiomegaly or pericardial effusion is evident. Mediastinum/Nodes: Increased pneumomediastinum since 06/17/2019, gas now tracking into the bilateral lower neck and also toward the core of the diaphragm. The left hilum is indistinct as before, but elsewhere no mediastinal or right hilar lymphadenopathy is identified. Lungs/Pleura: The left lower lobe bronchus more abruptly terminates now on series 7, image 44 and there is no residual more distal left lower lobe airway pneumatization as there was last month. Complete masslike opacification of the left lower lobe otherwise appears stable. Superimposed bullous emphysema, left upper lobe and right lung base predominant scarring. An indeterminate superior segment right lower lobe 8-9 mm oval nodule is stable from last month, new since 03/05/2020. No pleural effusion or new right lung opacity.  Upper Abdomen: Elevation of the left hemidiaphragm again suspected. The stomach and splenic flexure are visible on series 5, image 75. There is stool mixed  with oral contrast or other dense material in the colon. Evidence of chronic calcific pancreatitis. Grossly negative visible noncontrast liver, kidneys and adrenal glands. Musculoskeletal: Osteopenia. No acute or suspicious osseous lesion identified. Review of the MIP images confirms the above findings. IMPRESSION: 1. Stable pulmonary arteries, obliterated in the left lower lung but with no evidence of acute PE. 2. Continued mass-like opacification of the left lower lung with worsening abrupt termination of the lower lobe bronchus again highly suspicious for tumor. There is superimposed chronic elevation of the left hemidiaphragm. There is increased pneumomediastinum, now with subcutaneous gas tracking into the lower neck. It appears a PET-CT was scheduled for 06/25/2019 but did not occur. 3. Underlying bullous Emphysema (ICD10-J43.9). Aortic Atherosclerosis (ICD10-I70.0). Electronically Signed   By: Genevie Ann M.D.   On: 07/08/2019 19:36   CT Angio Chest PE W/Cm &/Or Wo Cm  Result Date: 06/17/2019 CLINICAL DATA:  Shortness of breath EXAM: CT ANGIOGRAPHY CHEST WITH CONTRAST TECHNIQUE: Multidetector CT imaging of the chest was performed using the standard protocol during bolus administration of intravenous contrast. Multiplanar CT image reconstructions and MIPs were obtained to evaluate the vascular anatomy. CONTRAST:  65mL OMNIPAQUE IOHEXOL 350 MG/ML SOLN COMPARISON:  03/06/2019 FINDINGS: Cardiovascular: No filling defects in the pulmonary arteries to suggest pulmonary emboli. Heart is normal size. Aorta normal caliber. Aortic and coronary artery atherosclerosis. Mediastinum/Nodes: There is pneumomediastinum. Gas seen around the main pulmonary artery and in the posterior mediastinum. No adenopathy. Lungs/Pleura: Volume loss on the left with elevation of the left hemidiaphragm. Findings are chronic. There is a chronic masslike area of consolidation in the left lower lobe which is similar to prior study. Advanced  centrilobular emphysema and areas of scarring in both lungs. Possible small left effusion, stable. Upper Abdomen: Imaging into the upper abdomen shows no acute findings. Musculoskeletal: Cachexia.  No acute bony abnormality. Review of the MIP images confirms the above findings. IMPRESSION: Pneumomediastinum seen in the middle and posterior mediastinum. No associated pneumothorax. Advanced centrilobular emphysema. Stable chronic volume loss on the left with elevation of the left hemidiaphragm and masslike consolidation in the left lower lobe. As recommended on prior study, bronchoscopy with attention to the left lower lobe and/or PET CT may be helpful to exclude left lower lobe mass lesion. Small right pleural effusion. Areas of scarring in the lungs bilaterally. Aortic Atherosclerosis (ICD10-I70.0) and Emphysema (ICD10-J43.9). Electronically Signed   By: Rolm Baptise M.D.   On: 06/17/2019 21:12   CT Abdomen Pelvis W Contrast  Result Date: 06/17/2019 CLINICAL DATA:  Hypercalcemia. EXAM: CT ABDOMEN AND PELVIS WITH CONTRAST TECHNIQUE: Multidetector CT imaging of the abdomen and pelvis was performed using the standard protocol following bolus administration of intravenous contrast. CONTRAST:  50mL OMNIPAQUE IOHEXOL 350 MG/ML SOLN COMPARISON:  None. FINDINGS: Lower chest: Masslike opacity seen in the left lower lobe as described on prior chest CT. Trace left pleural effusion. Volume loss on the left with elevation of the left hemidiaphragm. Advanced emphysema. Scarring in the right lower lobe. Hepatobiliary: No focal hepatic abnormality. Gallbladder unremarkable. Pancreas: Calcifications throughout the pancreas compatible with chronic pancreatitis. No evidence of acute pancreatitis, fall suspicious pancreatic lesion or pancreatic ductal dilatation. Spleen: No focal abnormality.  Normal size. Adrenals/Urinary Tract: Small cysts in the left kidney. No hydronephrosis. Adrenal glands and urinary bladder unremarkable.  Stomach/Bowel: Large stool burden throughout the  colon. Distention of the rectum with stool concerning for fecal impaction. Stomach and small bowel decompressed, unremarkable. Vascular/Lymphatic: Aortic atherosclerosis. No enlarged abdominal or pelvic lymph nodes. Reproductive: Prostate prominence. Other: No free fluid or free air. Musculoskeletal: Cachexia. No acute bony abnormality. Degenerative disc and facet disease in the lower lumbar spine. IMPRESSION: Masslike opacity in the left lower lobe as described on chest CT concerning for possible pulmonary mass lesions/malignancy. This could be further evaluated with bronchoscopy and/or PET CT as described on prior chest CT. Advanced emphysema. Large stool burden throughout the colon. Appearance is concerning for fecal impaction. Chronic pancreatitis changes.  No evidence of acute pancreatitis. Aortic atherosclerosis. Electronically Signed   By: Rolm Baptise M.D.   On: 06/17/2019 21:16   DG Chest Port 1 View  Result Date: 07/12/2019 CLINICAL DATA:  Dyspnea EXAM: PORTABLE CHEST 1 VIEW COMPARISON:  Two days ago FINDINGS: Retrocardiac opacity with volume loss, masslike appearance by CT and reportedly squamous cell carcinoma. Emphysema with hyperinflation of the right lung where there is interstitial coarsening. No visible residual pneumomediastinum. No pneumothorax is noted. IMPRESSION: 1. No progression from 2 days ago. Pneumo mediastinum is no longer clearly seen. 2. Left lower lobe mass and volume loss. 3. Emphysema. Electronically Signed   By: Monte Fantasia M.D.   On: 07/12/2019 09:06   DG Chest Port 1 View  Result Date: 06/25/2019 CLINICAL DATA:  Weakness, COVID-19 positive EXAM: PORTABLE CHEST 1 VIEW COMPARISON:  06/17/2019 FINDINGS: Chronic volume loss with consolidation in the left hemithorax, stable from prior. There may be mildly increased opacity within the aerated portion of the left lung. Heart size is stable. Mediastinal structures remain shifted  to the left. There is hyperexpansion of the right lung field with chronic emphysematous changes and right apical scarring. No pneumothorax. IMPRESSION: Chronic volume loss with consolidation in the left hemithorax, stable from prior. There may be mildly increased opacity within the aerated portion of the left lung, which could reflect pneumonia given the patient's history. Electronically Signed   By: Davina Poke D.O.   On: 06/25/2019 13:31   DG C-Arm 1-60 Min-No Report  Result Date: 06/30/2019 Fluoroscopy was utilized by the requesting physician.  No radiographic interpretation.   ECHOCARDIOGRAM COMPLETE  Result Date: 06/26/2019   ECHOCARDIOGRAM REPORT   Patient Name:   CLEBURN MAIOLO Date of Exam: 06/26/2019 Medical Rec #:  [id number]       Height:       68.0 in Accession #:    [id number]      Weight:       92.2 lb Date of Birth:  1940/05/27       BSA:          1.47 m Patient Age:    2 years        BP:           89/66 mmHg Patient Gender: M               HR:           129 bpm. Exam Location:  [facility] Procedure: 2D Echo Indications:     ATRIAL FIBRILLATION 427.31/I48.91  History:         Patient has no prior history of Echocardiogram examinations.                  COPD; Risk Factors:Hypertension.  Sonographer:     Avanell Shackleton Referring Phys:  [place] Diagnosing Phys: Ida Rogue MD  Sonographer Comments: Image  acquisition challenging due to patient body habitus. IMPRESSIONS  1. Left ventricular ejection fraction, by visual estimation, is 50 to 55%. The left ventricle has normal function. There is moderately increased left ventricular hypertrophy.  2. Left ventricular diastolic parameters are indeterminate.  3. The left ventricle has no regional wall motion abnormalities.  4. Global right ventricle has normal systolic function.The right ventricular size is normal. No increase in right ventricular wall thickness.  5. Left atrial size was normal.  6. Tricuspid valve regurgitation is  mild-moderate.  7. Mildly elevated pulmonary artery systolic pressure.  8. Tachycardia noted/atrial flutter, rate 130 bpm FINDINGS  Left Ventricle: Left ventricular ejection fraction, by visual estimation, is 50 to 55%. The left ventricle has normal function. The left ventricle has no regional wall motion abnormalities. There is moderately increased left ventricular hypertrophy. Left ventricular diastolic parameters are indeterminate. Normal left atrial pressure. Right Ventricle: The right ventricular size is normal. No increase in right ventricular wall thickness. Global RV systolic function is has normal systolic function. The tricuspid regurgitant velocity is 2.90 m/s, and with an assumed right atrial pressure  of 5 mmHg, the estimated right ventricular systolic pressure is mildly elevated at 38.6 mmHg. Left Atrium: Left atrial size was normal in size. Right Atrium: Right atrial size was normal in size Pericardium: There is no evidence of pericardial effusion. Mitral Valve: The mitral valve is normal in structure. Mild to moderate mitral valve regurgitation. No evidence of mitral valve stenosis by observation. Tricuspid Valve: The tricuspid valve is normal in structure. Tricuspid valve regurgitation is mild-moderate. Aortic Valve: The aortic valve is normal in structure. Aortic valve regurgitation is not visualized. Mild to moderate aortic valve sclerosis/calcification is present, without any evidence of aortic stenosis. Pulmonic Valve: The pulmonic valve was normal in structure. Pulmonic valve regurgitation is not visualized. Pulmonic regurgitation is not visualized. Aorta: The aortic root, ascending aorta and aortic arch are all structurally normal, with no evidence of dilitation or obstruction. Venous: The inferior vena cava is normal in size with greater than 50% respiratory variability, suggesting right atrial pressure of 3 mmHg. IAS/Shunts: No atrial level shunt detected by color flow Doppler. There is no  evidence of a patent foramen ovale. No ventricular septal defect is seen or detected. There is no evidence of an atrial septal defect.  LEFT VENTRICLE PLAX 2D LVIDd:         2.60 cm LVIDs:         2.26 cm LV PW:         0.68 cm LV IVS:        0.92 cm LVOT diam:     1.70 cm LV SV:         7 ml LV SV Index:   5.30 LVOT Area:     2.27 cm  LEFT ATRIUM             Index       RIGHT ATRIUM           Index LA diam:        2.70 cm 1.84 cm/m  RA Area:     16.00 cm LA Vol (A2C):   58.4 ml 39.72 ml/m RA Volume:   45.70 ml  31.08 ml/m LA Vol (A4C):   33.6 ml 22.85 ml/m LA Biplane Vol: 48.1 ml 32.71 ml/m   AORTA Ao Root diam: 3.30 cm TRICUSPID VALVE TR Peak grad:   33.6 mmHg TR Vmax:        305.00 cm/s  SHUNTS Systemic Diam: 1.70 cm  Ida Rogue MD Electronically signed by Ida Rogue MD Signature Date/Time: 06/26/2019/7:28:13 PM    Final    [place] EKG SITE RITE  Result Date: 06/26/2019 If Site Rite image not attached, placement could not be confirmed due to current cardiac rhythm.      Subjective: Denies any difficulty breathing.  Wants to go home and get started on treatment for his cancer.  Discharge Exam: Vitals:   07/15/19 0602 07/15/19 1151  BP: (!) 160/77 136/70  Pulse: 70 70  Resp:    Temp: 98.7 F (37.1 C) 98.7 F (37.1 C)  SpO2: 100% 100%   Vitals:   07/14/19 1400 07/14/19 2055 07/15/19 0602 07/15/19 1151  BP: 115/81 (!) 157/91 (!) 160/77 136/70  Pulse: 70 75 70 70  Resp: 18 20    Temp: 97.9 F (36.6 C) 98.3 F (36.8 C) 98.7 F (37.1 C) 98.7 F (37.1 C)  TempSrc: Oral Oral Oral Oral  SpO2:  100% 100% 100%  Weight:      Height:        General: Elderly male, cachectic, fatigued, not in distress HEENT: Pallor present, temporal wasting, no pallor, moist mucosa, supple neck Chest: Clear to auscultation bilaterally CVs: Normal S1-S2, no murmurs GI: Soft, nondistended, nontender Musculoskeletal: Warm, no edema     The results of significant diagnostics from this  hospitalization (including imaging, microbiology, ancillary and laboratory) are listed below for reference.     Microbiology: Recent Results (from the past 240 hour(s))  Respiratory Panel by RT PCR (Flu A&B, Covid) - Nasopharyngeal Swab     Status: None   Collection Time: 07/08/19  7:43 PM   Specimen: Nasopharyngeal Swab  Result Value Ref Range Status   SARS Coronavirus 2 by RT PCR NEGATIVE NEGATIVE Final    Comment: (NOTE) SARS-CoV-2 target nucleic acids are NOT DETECTED. The SARS-CoV-2 RNA is generally detectable in upper respiratoy specimens during the acute phase of infection. The lowest concentration of SARS-CoV-2 viral copies this assay can detect is 131 copies/mL. A negative result does not preclude SARS-Cov-2 infection and should not be used as the sole basis for treatment or other patient management decisions. A negative result may occur with  improper specimen collection/handling, submission of specimen other than nasopharyngeal swab, presence of viral mutation(s) within the areas targeted by this assay, and inadequate number of viral copies (<131 copies/mL). A negative result must be combined with clinical observations, patient history, and epidemiological information. The expected result is Negative. Fact Sheet for Patients:  PinkCheek.be Fact Sheet for Healthcare Providers:  GravelBags.it This test is not yet ap proved or cleared by the [place] FDA and  has been authorized for detection and/or diagnosis of SARS-CoV-2 by FDA under an Emergency Use Authorization (EUA). This EUA will remain  in effect (meaning this test can be used) for the duration of the COVID-19 declaration under Section 564(b)(1) of the Act, 21 U.S.C. section 360bbb-3(b)(1), unless the authorization is terminated or revoked sooner.    Influenza A by PCR NEGATIVE NEGATIVE Final   Influenza B by PCR NEGATIVE NEGATIVE Final    Comment:  (NOTE) The Xpert Xpress SARS-CoV-2/FLU/RSV assay is intended as an aid in  the diagnosis of influenza from Nasopharyngeal swab specimens and  should not be used as a sole basis for treatment. Nasal washings and  aspirates are unacceptable for Xpert Xpress SARS-CoV-2/FLU/RSV  testing. Fact Sheet for Patients: PinkCheek.be Fact Sheet for Healthcare Providers: GravelBags.it This test is not yet  approved or cleared by the [place] and  has been authorized for detection and/or diagnosis of SARS-CoV-2 by  FDA under an Emergency Use Authorization (EUA). This EUA will remain  in effect (meaning this test can be used) for the duration of the  Covid-19 declaration under Section 564(b)(1) of the Act, 21  U.S.C. section 360bbb-3(b)(1), unless the authorization is  terminated or revoked. Performed at [facility], [address] [place], [place] [address]      Labs: BNP (last 3 results) Recent Labs    07/08/19 1811  BNP 324.4*   Basic Metabolic Panel: Recent Labs  Lab 07/08/19 1811 07/09/19 0629 07/10/19 0501 07/11/19 0316 07/12/19 0636  NA 136 137 137 138 137  K 4.4 4.2 3.8 4.0 4.0  CL 101 107 109 110 109  CO2 21* 24 24 23 22   GLUCOSE 140* 111* 101* 113* 77  BUN 26* 18 15 14 16   CREATININE 1.18 0.83 0.72 0.67 0.70  CALCIUM 9.2 8.4* 8.2* 8.1* 8.1*  MG  --   --  1.6* 2.1 1.9  PHOS  --   --  1.4* 2.0* 2.5   Liver Function Tests: Recent Labs  Lab 07/08/19 1811 07/09/19 0629  AST 19 15  ALT 35 27  ALKPHOS 63 48  BILITOT 0.9 1.0  PROT 6.7 5.4*  ALBUMIN 3.2* 2.6*   No results for input(s): LIPASE, AMYLASE in the last 168 hours. No results for input(s): AMMONIA in the last 168 hours. CBC: Recent Labs  Lab 07/08/19 1811 07/09/19 0629 07/10/19 0501 07/11/19 0316 07/12/19 0636  WBC 16.8* 21.8* 19.1* 19.1* 20.0*  NEUTROABS 15.5*  --   --   --   --   HGB 10.2* 8.3* 7.6* 7.5* 7.6*  HCT 31.5*  25.1* 22.5* 22.3* 22.8*  MCV 84.7 81.8 83.0 82.9 82.6  PLT 655* 513* 458* 455* 439*   Cardiac Enzymes: No results for input(s): CKTOTAL, CKMB, CKMBINDEX, TROPONINI in the last 168 hours. BNP: Invalid input(s): POCBNP CBG: No results for input(s): GLUCAP in the last 168 hours. D-Dimer No results for input(s): DDIMER in the last 72 hours. Hgb A1c No results for input(s): HGBA1C in the last 72 hours. Lipid Profile No results for input(s): CHOL, HDL, LDLCALC, TRIG, CHOLHDL, LDLDIRECT in the last 72 hours. Thyroid function studies No results for input(s): TSH, T4TOTAL, T3FREE, THYROIDAB in the last 72 hours.  Invalid input(s): FREET3 Anemia work up No results for input(s): VITAMINB12, FOLATE, FERRITIN, TIBC, IRON, RETICCTPCT in the last 72 hours. Urinalysis    Component Value Date/Time   COLORURINE AMBER (A) 07/08/2019 1845   APPEARANCEUR HAZY (A) 07/08/2019 1845   APPEARANCEUR Clear 11/14/2016 1538   LABSPEC 1.031 (H) 07/08/2019 1845   PHURINE 5.0 07/08/2019 1845   GLUCOSEU NEGATIVE 07/08/2019 1845   HGBUR NEGATIVE 07/08/2019 1845   BILIRUBINUR NEGATIVE 07/08/2019 1845   BILIRUBINUR Negative 11/14/2016 1538   [place] 07/08/2019 1845   PROTEINUR 30 (A) 07/08/2019 1845   NITRITE NEGATIVE 07/08/2019 1845   LEUKOCYTESUR NEGATIVE 07/08/2019 1845   Sepsis Labs Invalid input(s): PROCALCITONIN,  WBC,  LACTICIDVEN Microbiology Recent Results (from the past 240 hour(s))  Respiratory Panel by RT PCR (Flu A&B, Covid) - Nasopharyngeal Swab     Status: None   Collection Time: 07/08/19  7:43 PM   Specimen: Nasopharyngeal Swab  Result Value Ref Range Status   SARS Coronavirus 2 by RT PCR NEGATIVE NEGATIVE Final    Comment: (NOTE) SARS-CoV-2 target nucleic acids are NOT  DETECTED. The SARS-CoV-2 RNA is generally detectable in upper respiratoy specimens during the acute phase of infection. The lowest concentration of SARS-CoV-2 viral copies this assay can detect is 131  copies/mL. A negative result does not preclude SARS-Cov-2 infection and should not be used as the sole basis for treatment or other patient management decisions. A negative result may occur with  improper specimen collection/handling, submission of specimen other than nasopharyngeal swab, presence of viral mutation(s) within the areas targeted by this assay, and inadequate number of viral copies (<131 copies/mL). A negative result must be combined with clinical observations, patient history, and epidemiological information. The expected result is Negative. Fact Sheet for Patients:  PinkCheek.be Fact Sheet for Healthcare Providers:  GravelBags.it This test is not yet ap proved or cleared by the [place] FDA and  has been authorized for detection and/or diagnosis of SARS-CoV-2 by FDA under an Emergency Use Authorization (EUA). This EUA will remain  in effect (meaning this test can be used) for the duration of the COVID-19 declaration under Section 564(b)(1) of the Act, 21 U.S.C. section 360bbb-3(b)(1), unless the authorization is terminated or revoked sooner.    Influenza A by PCR NEGATIVE NEGATIVE Final   Influenza B by PCR NEGATIVE NEGATIVE Final    Comment: (NOTE) The Xpert Xpress SARS-CoV-2/FLU/RSV assay is intended as an aid in  the diagnosis of influenza from Nasopharyngeal swab specimens and  should not be used as a sole basis for treatment. Nasal washings and  aspirates are unacceptable for Xpert Xpress SARS-CoV-2/FLU/RSV  testing. Fact Sheet for Patients: PinkCheek.be Fact Sheet for Healthcare Providers: GravelBags.it This test is not yet approved or cleared by the [place] FDA and  has been authorized for detection and/or diagnosis of SARS-CoV-2 by  FDA under an Emergency Use Authorization (EUA). This EUA will remain  in effect (meaning this test can  be used) for the duration of the  Covid-19 declaration under Section 564(b)(1) of the Act, 21  U.S.C. section 360bbb-3(b)(1), unless the authorization is  terminated or revoked. Performed at [facility], [address], [place], [place] [address]      Time coordinating discharge: 35 minutes  SIGNED:   Louellen Molder, MD  [facility] 07/15/2019, 3:24 PM Pager   If 7PM-7AM, please contact night-coverage [email or web address] Password [email or web address]

## 2019-07-15 NOTE — Progress Notes (Signed)
Nutrition Follow Up Note   DOCUMENTATION CODES:   Severe malnutrition in context of chronic illness  INTERVENTION:   Ensure Enlive po TID, each supplement provides 350 kcal and 20 grams of protein  Vital Cuisine TID, each supplement provides 520kcal and 22g of protein.   Magic cup TID with meals, each supplement provides 290 kcal and 9 grams of protein  Provide daily MVI  NUTRITION DIAGNOSIS:   Severe Malnutrition related to chronic illness(COPD, lung mass) as evidenced by severe fat depletion, severe muscle depletion, 30.2% weight loss over 4 months.  GOAL:   Patient will meet greater than or equal to 90% of their needs  -progressing   MONITOR:   PO intake, Supplement acceptance, Labs, Weight trends, Skin, I & O's  ASSESSMENT:   79 y/o male with h/o HTN, COPD, Afib, COVID PNA, severe malnutrition who is admitted with SOB and FTT  Pt with improved appetite and oral intake after addition of megace. Pt ate 100% of his breakfast today which included french toast, grapes and OJ. Pt is refusing most of the Ensure supplements but is receiving Magic Cups on his meal trays. RD will add Vital Cuisine as pt may enjoy this better than Ensure. Refeed labs last checked 2/20 were wnl. No new weight since admit; RD will request weekly weights. Palliative care following for GOC.   Medications reviewed and include: vitamin C, aspirin, dulcolax, dexamethasone, heparin, megace, MVI, lovaza, protonix, miralax, B12, vitamin E, NaCl @50ml /hr, ceftriaxone, doxycycline  Labs reviewed: K 4.0 wnl, P 2.5 wnl, Mg 1.9 wnl- 2/20 Wbc- 20.0(H), Hgb 7.6(L), Hct 22.8(L)- 2/20  Diet Order:   Diet Order            Diet regular Room service appropriate? Yes; Fluid consistency: Thin  Diet effective now             EDUCATION NEEDS:   Education needs have been addressed  Skin:  Skin Assessment: Reviewed RN Assessment(ecchymosis, MASD, Stage II coccyx)  Last BM:  2/22- type 6  Height:   Ht Readings  from Last 1 Encounters:  07/08/19 5\' 8"  (1.727 m)   Weight:   Wt Readings from Last 1 Encounters:  07/08/19 43 kg   Ideal Body Weight:  70 kg  BMI:  Body mass index is 14.41 kg/m.  Estimated Nutritional Needs:   Kcal:  1400-1600kcal/day  Protein:  70-80g/day  Fluid:  1.4-1.6L/day  Koleen Distance MS, RD, LDN Contact information available in Amion

## 2019-07-15 NOTE — TOC Progression Note (Signed)
Transition of Care [facility]) - Progression Note    Patient Details  Name: Corey Carr MRN: [id number] Date of Birth: 10/09/1940  Transition of Care [facility] CM/SW [place], LCSW Phone Number: 07/15/2019, 4:08 PM  Clinical Narrative: Patient's wife unable to transport patient's oxygen on her own. Her brother unable to help her until tomorrow morning. EMS said patient is walking too well for them to take him home. [employer] said their tanks are bigger than what he already has. Patient does not have a portable tank. Made wife aware that insurance likely will not cover stay for tonight. She expressed understanding. Encouraged her to notify the unit if they are able to pick him up even late tonight. Sent message to RN and MD to notify.    Expected Discharge Plan: [place] Barriers to Discharge: Barriers Resolved  Expected Discharge Plan and Services Expected Discharge Plan: [place] Choice: [place] arrangements for the past 2 months: Single Family Home Expected Discharge Date: 07/15/19                         HH Arranged: RN, PT [facility] Agency: [place] Date [facility] Agency Contacted: 07/15/19 Time [place]: 1544 Representative spoke with at [place]: [place] ([place]) Interventions    Readmission Risk Interventions Readmission Risk Prevention Plan 07/14/2019  Medication Review (RN Care Manager) Complete  PCP or Specialist appointment within 3-5 days of discharge Complete  HRI or Home Care Consult Complete  Palliative Care Screening Not [place] Complete  Some recent data might be hidden

## 2019-07-15 NOTE — TOC Transition Note (Signed)
Transition of Care [facility]) - CM/SW Discharge Note   Patient Details  Name: Corey Carr MRN: [id number] Date of Birth: 04-08-1941  Transition of Care [facility]) CM/SW Contact:  Magnus Ivan, [place] Phone Number: 07/15/2019, 3:45 PM   Clinical Narrative:   Patient has orders to discharge home today. Patient was made aware of discharge. CSW spoke with Malachy Mood at [facility] to inform her of patient's discharge and confirm they can still provide services to patient. No additional needs identified. CSW signing off.    Final next level of care: [place] Barriers to Discharge: Barriers Resolved   Patient Goals and CMS Choice Patient states their goals for this hospitalization and ongoing recovery are:: to return home with home health services.   Choice offered to / list presented to : NA(Patient reported he is already active with [facility] and would like to continue with them for home health services.)  Discharge Placement                  Name of family member notified: Wife, [name] Patient and family notified of of transfer: 07/15/19  Discharge Plan and Services     Post Acute Care Choice: Home Health                    HH Arranged: RN, PT [facility] Agency: [place] Date [place]: 07/15/19 Time [place]: 1544 Representative spoke with at [place]: [place] ([place]) Interventions     Readmission Risk Interventions Readmission Risk Prevention Plan 07/14/2019  Medication Review (RN Care Manager) Complete  PCP or Specialist appointment within 3-5 days of discharge Complete  HRI or Home Care Consult Complete  Palliative Care Screening Not Applicable  Skilled Nursing Facility Complete  Some recent data might be hidden

## 2019-07-15 NOTE — Progress Notes (Signed)
Physical Therapy Treatment Patient Details Name: Corey Carr MRN: [id number] DOB: [birth year] Today's Date: 07/15/2019    History of Present Illness Tom Macpherson is a 34yoM who [facility] after acute onset worseing weakness and SOB. Pt is familiar to our services from prior admissions. 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 Pt has continued to have advancing cachexia over th epast several weaks.    PT Comments    Pt is making good progress towards goals with pt continuing to be motivated to participate in therapy in order to make progress to go home. Very cachectic. IV leaking upon arrival, RN in room to address. Able to ambulate around RN station with 3L of O2, however unable to obtain accurate O2 sats. Good endurance with there-ex. Will continue to progress.   Follow Up Recommendations  Other (comment);Home health PT;Supervision for mobility/OOB     Equipment Recommendations  None recommended by PT    Recommendations for Other Services       Precautions / Restrictions Precautions Precautions: Fall Precaution Comments: High Fall Restrictions Weight Bearing Restrictions: No    Mobility  Bed Mobility Overal bed mobility: Modified Independent             General bed mobility comments: able to transition to EOB with safe technique. Once seated at EOB, upright posture noted.  Transfers Overall transfer level: Needs assistance Equipment used: Rolling walker (2 wheeled) Transfers: Sit to/from Stand Sit to Stand: Min guard         General transfer comment: CGA for safety with transfers  Ambulation/Gait Ambulation/Gait assistance: Min guard Gait Distance (Feet): 200 Feet Assistive device: Rolling walker (2 wheeled) Gait Pattern/deviations: Step-through pattern;Narrow base of support;Scissoring;Trunk flexed     General Gait Details: ambulated around RN station using RW. All mobility performed on 3L of O2,  however unable to obtain accurate pulse Ox reading. Slightly SOB symptoms with exertion. Able to demonstrate reciprocal gait pattern.   Stairs             Wheelchair Mobility    Modified Rankin (Stroke Patients Only)       Balance Overall balance assessment: Needs assistance Sitting-balance support: Feet supported Sitting balance-Leahy Scale: Good     Standing balance support: Bilateral upper extremity supported Standing balance-Leahy Scale: Fair Standing balance comment: in static standing, balance is good however dynamic balace is only fair 2/2 to pt tends to have very narrow BOS and occasional scissoring.                            Cognition Arousal/Alertness: Awake/alert Behavior During Therapy: WFL for tasks assessed/performed Overall Cognitive Status: Within Functional Limits for tasks assessed                                 General Comments: Pt is A and O x 4. does like things to be neat and has a particular way of doing blankets.       Exercises Other Exercises Other Exercises: Educated patient in breathing techniques/UE AROM to improve lung capacity and activity tolerance Other Exercises: Educated patient in ankle pumps while in bed Other Exercises: Performed supine ther-ex on B LE including AP, ankle circles, SLRs, hip abd/add, heel slides, and SAQ. All ther-ex performed x 15 reps with supervision/cga. Rest breaks as needed for SOB symptoms. Other Exercises: Educated on  pursed lip breathing with exertion.     General Comments        Pertinent Vitals/Pain Pain Assessment: No/denies pain Pain Score: 2  Pain Location: Patient noted discomfort in chest Pain Intervention(s): Monitored during session    [place] expects to be discharged to:: Private residence Living Arrangements: Spouse/significant other Available Help at Discharge: Family;Available 24 hours/day Type of Home: Mobile home Home Access: Stairs to  enter Entrance Stairs-Rails: Left;Right Home Layout: One level Home Equipment: Environmental consultant - 2 wheels;Walker - 4 wheels;Cane - single point;Toilet riser;Grab bars - tub/shower      Prior Function Level of Independence: Independent with assistive device(s)      Comments: Per pt continues to mobilize in the home with RW fairly well despite cachexia and ARF.   PT Goals (current goals can now be found in the care plan section) Acute Rehab PT Goals Patient Stated Goal: I want to get strronger PT Goal Formulation: With patient Time For Goal Achievement: 07/24/19 Potential to Achieve Goals: Fair Progress towards PT goals: Progressing toward goals    Frequency    Min 2X/week      PT Plan Current plan remains appropriate    Co-evaluation              AM-PAC PT "6 Clicks" Mobility   Outcome Measure  Help needed turning from your back to your side while in a flat bed without using bedrails?: None Help needed moving from lying on your back to sitting on the side of a flat bed without using bedrails?: None Help needed moving to and from a bed to a chair (including a wheelchair)?: None Help needed standing up from a chair using your arms (e.g., wheelchair or bedside chair)?: A Little Help needed to walk in hospital room?: A Little Help needed climbing 3-5 steps with a railing? : A Little 6 Click Score: 21    End of Session Equipment Utilized During Treatment: Oxygen;Gait belt Activity Tolerance: Patient tolerated treatment well Patient left: in bed;with call bell/phone within reach;with bed alarm set(refused to sit in recliner) Nurse Communication: Mobility status PT Visit Diagnosis: Difficulty in walking, not elsewhere classified (R26.2);Muscle weakness (generalized) (M62.81);Unsteadiness on feet (R26.81)     Time: 1696-7893 PT Time Calculation (min) (ACUTE ONLY): 31 min  Charges:  $Gait Training: 8-22 mins $Therapeutic Exercise: 8-22 mins                     Greggory Stallion, PT, DPT [phone]    Rafel Garde 07/15/2019, 12:47 PM

## 2019-07-16 NOTE — Progress Notes (Signed)
Patient discharged to home with wife and brother. Family brought home oxygen and was transferred to home oxygen tank at discharge on 3L. Patient states has home oxygen. Demonstrated use of oxygen tank. PIV d/c with tip intact. Discharge instructions given to patient and wife with hard copy RX of narcotics. Patient nad family verbalized understanding and appreciation.  Assisted in car to discharge.

## 2019-07-19 NOTE — Progress Notes (Signed)
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  INTERVAL HISTORY: Patient return to clinic today for further evaluation, discussion of his imaging results, and treatment planning.  This performance status continues to decline.  He continues to have significant weakness and fatigue.  He states his pain is well controlled on his current narcotic regimen.  He continues to have a poor appetite and weight loss. He has no neurologic complaints.  He denies any fevers.  He has no chest pain, shortness of breath, cough, or hemoptysis.  He denies any nausea, vomiting, or constipation.  He does complain of occasional diarrhea.  He has no urinary complaints.  Patient feels generally terrible, but offers no further specific complaints today.  REVIEW OF SYSTEMS:   Review of Systems  Constitutional: Positive for malaise/fatigue and weight loss. Negative for fever.  Respiratory: Negative.  Negative for cough and hemoptysis.   Cardiovascular: Negative.  Negative for chest pain and leg swelling.  Gastrointestinal: Positive for diarrhea. Negative for abdominal pain, blood in stool, constipation and melena.       Rectal pain.  Genitourinary: Negative for dysuria.  Musculoskeletal: Negative.  Negative for back pain.  Skin: Negative.  Negative for rash.  Neurological: Positive for weakness. Negative for dizziness, focal weakness and headaches.  Psychiatric/Behavioral: The patient is not nervous/anxious.     As per HPI. Otherwise, a complete review of systems is negative.  PAST MEDICAL HISTORY: Past Medical History:  Diagnosis Date  . Atrial flutter ([place])    CHADsVASc 3  .  Hypertension     PAST SURGICAL HISTORY: Past Surgical History:  Procedure Laterality Date  . KNEE SURGERY Right 1957  . VIDEO BRONCHOSCOPY WITH ENDOBRONCHIAL NAVIGATION N/A 06/30/2019   Procedure: VIDEO BRONCHOSCOPY WITH ENDOBRONCHIAL NAVIGATION;  Surgeon: Ottie Glazier, MD;  Location: [facility] ORS;  Service: Thoracic;  Laterality: N/A;  . VIDEO BRONCHOSCOPY WITH ENDOBRONCHIAL ULTRASOUND N/A 06/30/2019   Procedure: VIDEO BRONCHOSCOPY WITH ENDOBRONCHIAL ULTRASOUND;  Surgeon: Ottie Glazier, MD;  Location: [facility] ORS;  Service: Thoracic;  Laterality: N/A;    FAMILY HISTORY: Family History  Problem Relation Age of Onset  . Emphysema Father   . Lung cancer Maternal Uncle     ADVANCED DIRECTIVES (Y/N):  N  HEALTH MAINTENANCE: Social History   Tobacco Use  . Smoking status: Former Smoker    Packs/day: 0.50    Years: 60.00    Pack years: 30.00    Types: Cigarettes    Quit date: 2015    Years since quitting: 6.1  . Smokeless tobacco: Never Used  Substance Use Topics  . Alcohol use: No  . Drug use: No     Colonoscopy:  PAP:  Bone density:  Lipid panel:  No Known Allergies  Current Outpatient Medications  Medication Sig Dispense Refill  . meloxicam (MOBIC) 15 MG tablet Take 1 tablet by mouth daily as needed.    Marland Kitchen albuterol (VENTOLIN HFA) 108 (90 Base) MCG/ACT inhaler Inhale 2 puffs into the lungs See admin instructions. Inhale 2 puffs into the lungs every 3 hours as needed for wheezing.    Marland Kitchen amiodarone (PACERONE) 200 MG tablet Take 1 tablet (200 mg total) by mouth 2 (two) times daily. 60 tablet 0  .  aspirin 325 MG tablet Take 1 tablet by mouth daily.    . [facility] GLYCERIN ADULT 2 g suppository SMARTSIG:1 SUPPOS Rectally As Needed    . dexamethasone (DECADRON) 4 MG tablet Take 1 tablet (4 mg total) by mouth daily. 30 tablet 1  . esomeprazole (NEXIUM) 20 MG capsule Take 20 mg by mouth daily at 12 noon.    . feeding supplement, ENSURE ENLIVE, (ENSURE ENLIVE) LIQD Take 237 mLs by mouth  3 (three) times daily between meals. 21330 mL 0  . fluticasone (FLONASE) 50 MCG/ACT nasal spray Place 2 sprays into both nostrils daily as needed for allergies or rhinitis.    . Fluticasone-Umeclidin-Vilant (TRELEGY ELLIPTA) 100-62.5-25 MCG/INH AEPB Inhale 1 puff into the lungs daily.    Marland Kitchen guaiFENesin-codeine 100-10 MG/5ML syrup Take 5 mLs by mouth every 6 (six) hours as needed for cough. 180 mL 0  . HYDROcodone-acetaminophen (NORCO/VICODIN) 5-325 MG tablet Take 1 tablet by mouth every 6 (six) hours as needed. 30 tablet 0  . loratadine (ALLERGY RELIEF) 10 MG tablet Take 1 tablet (10 mg total) by mouth daily. 30 tablet 1  . losartan-hydrochlorothiazide (HYZAAR) 100-25 MG tablet Take 1 tablet by mouth daily.    . megestrol (MEGACE) 400 MG/10ML suspension Take 10 mLs (400 mg total) by mouth daily. 480 mL 0  . omega-3 acid ethyl esters (LOVAZA) 1 g capsule Take 1 g by mouth daily.    . polyethylene glycol powder (GLYCOLAX/MIRALAX) 17 GM/SCOOP powder Take 17 g by mouth 2 (two) times daily. Mix in 4-8 ounces of fluid prior to taking.    . vitamin B-12 (CYANOCOBALAMIN) 500 MCG tablet Take 500 mcg by mouth daily.    . vitamin C (ASCORBIC ACID) 250 MG tablet Take 500 mg by mouth daily.     . vitamin E (VITAMIN E) 400 UNIT capsule Take 400 Units by mouth daily.     No current facility-administered medications for this visit.    OBJECTIVE: There were no vitals filed for this visit.   There is no height or weight on file to calculate BMI.    ECOG FS:3 - Symptomatic, >50% confined to bed  General: Cachectic, no acute distress. Eyes: Pink conjunctiva, anicteric sclera. HEENT: Normocephalic, moist mucous membranes. Lungs: No audible wheezing or coughing. Heart: Regular rate and rhythm. Abdomen: Soft, nontender, no obvious distention. Musculoskeletal: No edema, cyanosis, or clubbing. Neuro: Alert, answering all questions appropriately. Cranial nerves grossly intact. Skin: No rashes or petechiae  noted. Psych: Normal affect.   LAB RESULTS:  Lab Results  Component Value Date   NA 137 07/12/2019   K 4.0 07/12/2019   CL 109 07/12/2019   CO2 22 07/12/2019   GLUCOSE 77 07/12/2019   BUN 16 07/12/2019   CREATININE 0.70 07/12/2019   CALCIUM 8.1 (L) 07/12/2019   PROT 5.4 (L) 07/09/2019   ALBUMIN 2.6 (L) 07/09/2019   AST 15 07/09/2019   ALT 27 07/09/2019   ALKPHOS 48 07/09/2019   BILITOT 1.0 07/09/2019   GFRNONAA >60 07/12/2019   GFRAA >60 07/12/2019    Lab Results  Component Value Date   WBC 20.0 (H) 07/12/2019   NEUTROABS 15.5 (H) 07/08/2019   HGB 7.6 (L) 07/12/2019   HCT 22.8 (L) 07/12/2019   MCV 82.6 07/12/2019   PLT 439 (H) 07/12/2019     STUDIES: DG Chest 1 View  Result Date: 07/10/2019 CLINICAL DATA:  Pneumomediastinum. Additional history provided: History of hypertension. Former smoker. EXAM: CHEST  1 VIEW COMPARISON:  CT angiogram chest  07/08/2019, chest radiograph 06/25/2019 FINDINGS: The cardiomediastinal silhouette is unchanged, although largely obscured. Persistent curvilinear opacity projecting along the lateral aspect of the descending thoracic aorta consistent with previously demonstrated pneumomediastinum. Unchanged masslike consolidation within the left lower lung with chronic elevation of the left hemidiaphragm. A left pleural effusion is difficult to exclude. Redemonstrated background emphysematous change with biapical pleuroparenchymal scarring. Chronically increased interstitial markings within the aerated portions of the left lung. A right lower lobe pulmonary nodule was better appreciated on recent prior CT. No evidence of pneumothorax. No acute bony abnormality. IMPRESSION: 1. Similar appearance of pneumomediastinum as compared to chest radiograph 06/25/2019. However, progression of pneumomediastinum was noted on prior CT 07/08/2019. 2. Unchanged masslike consolidation within the left lower lung with chronic elevation of the left hemidiaphragm. 3.  Redemonstrated background bullous emphysema with biapical pleuroparenchymal scarring. 4. A right lower lobe pulmonary nodule was better appreciated on recent prior CT. Electronically Signed   By: Kellie Simmering DO   On: 07/10/2019 11:21   CT Head Wo Contrast  Result Date: 07/08/2019 CLINICAL DATA:  Lung cancer. Assessment for hemorrhage in the setting of possible need for anticoagulation. EXAM: CT HEAD WITHOUT CONTRAST TECHNIQUE: Contiguous axial images were obtained from the base of the skull through the vertex without intravenous contrast. COMPARISON:  None. FINDINGS: Brain: There is no mass, hemorrhage or extra-axial collection. There is generalized atrophy without lobar predilection. Hypodensity of the white matter is most commonly associated with chronic microvascular disease. Vascular: No abnormal hyperdensity of the major intracranial arteries or dural venous sinuses. No intracranial atherosclerosis. Skull: The visualized skull base, calvarium and extracranial soft tissues are normal. Sinuses/Orbits: No fluid levels or advanced mucosal thickening of the visualized paranasal sinuses. No mastoid or middle ear effusion. The orbits are normal. IMPRESSION: 1. No hemorrhage or other acute abnormality. 2. Chronic ischemic microangiopathy and generalized volume loss. Electronically Signed   By: Ulyses Jarred M.D.   On: 07/08/2019 19:37   CT Angio Chest PE W and/or Wo Contrast  Result Date: 07/08/2019 CLINICAL DATA:  79 year old male with shortness of breath, chronic oxygen. Currently being evaluated for possible left lower lobe mass. EXAM: CT ANGIOGRAPHY CHEST WITH CONTRAST TECHNIQUE: Multidetector CT imaging of the chest was performed using the standard protocol during bolus administration of intravenous contrast. Multiplanar CT image reconstructions and MIPs were obtained to evaluate the vascular anatomy. CONTRAST:  85m OMNIPAQUE IOHEXOL 350 MG/ML SOLN COMPARISON:  CTA chest and CT Abdomen and Pelvis  06/17/2019. FINDINGS: Cardiovascular: Good contrast bolus timing in the pulmonary arterial tree. Pulmonary artery patency in the left lung appears stable since last month, with obliterated left lower lobe pulmonary arteries. No central or hilar pulmonary embolus. The right lung pulmonary arteries appear patent and within normal limits. No aortic contrast today. Calcified aortic atherosclerosis. The heart remain shifted over to the left. No cardiomegaly or pericardial effusion is evident. Mediastinum/Nodes: Increased pneumomediastinum since 06/17/2019, gas now tracking into the bilateral lower neck and also toward the core of the diaphragm. The left hilum is indistinct as before, but elsewhere no mediastinal or right hilar lymphadenopathy is identified. Lungs/Pleura: The left lower lobe bronchus more abruptly terminates now on series 7, image 44 and there is no residual more distal left lower lobe airway pneumatization as there was last month. Complete masslike opacification of the left lower lobe otherwise appears stable. Superimposed bullous emphysema, left upper lobe and right lung base predominant scarring. An indeterminate superior segment right lower lobe 8-9 mm oval nodule is stable  from last month, new since 03/05/2020. No pleural effusion or new right lung opacity. Upper Abdomen: Elevation of the left hemidiaphragm again suspected. The stomach and splenic flexure are visible on series 5, image 75. There is stool mixed with oral contrast or other dense material in the colon. Evidence of chronic calcific pancreatitis. Grossly negative visible noncontrast liver, kidneys and adrenal glands. Musculoskeletal: Osteopenia. No acute or suspicious osseous lesion identified. Review of the MIP images confirms the above findings. IMPRESSION: 1. Stable pulmonary arteries, obliterated in the left lower lung but with no evidence of acute PE. 2. Continued mass-like opacification of the left lower lung with worsening abrupt  termination of the lower lobe bronchus again highly suspicious for tumor. There is superimposed chronic elevation of the left hemidiaphragm. There is increased pneumomediastinum, now with subcutaneous gas tracking into the lower neck. It appears a PET-CT was scheduled for 06/25/2019 but did not occur. 3. Underlying bullous Emphysema (ICD10-J43.9). Aortic Atherosclerosis (ICD10-I70.0). Electronically Signed   By: Genevie Ann M.D.   On: 07/08/2019 19:36   NM PET Image Initial (PI) Skull Base To Thigh  Result Date: 07/23/2019 CLINICAL DATA:  Initial treatment strategy for lung carcinoma. LEFT lower lobe mass EXAM: NUCLEAR MEDICINE PET SKULL BASE TO THIGH TECHNIQUE: 5.6 mCi F-18 FDG was injected intravenously. Full-ring PET imaging was performed from the skull base to thigh after the radiotracer. CT data was obtained and used for attenuation correction and anatomic localization. Fasting blood glucose: 73 mg/dl COMPARISON:  3 CT 07/08/2019 FINDINGS: Mediastinal blood pool activity: SUV max 1.7 Liver activity: SUV max NA NECK: No cervical hypermetabolic lymph nodes. Incidental CT findings: none CHEST: Large intense hypermetabolic mass occupying large portion of the LEFT lower lobe and obstructing the LEFT lobe bronchus. Mass measures 7.6 x 5.5 cm with SUV max equal 25.8. No evidence chest wall invasion. No hypermetabolic mediastinal lymph nodes. There is volume loss in the LEFT hemithorax with some shift of the mediastinum leftward. The RIGHT lung is hyperinflated. Within the RIGHT lower lobe there is a 10 mm nodule (image 98/3) with SUV max equal 1.8. This nodule is new from CT 03/06/2019 and therefore concerning for malignancy Incidental CT findings: Centrilobular emphysema ABDOMEN/PELVIS: No abnormal hypermetabolic activity within the liver, pancreas, adrenal glands, or spleen. No hypermetabolic lymph nodes in the abdomen or pelvis. Incidental CT findings: Large stool ball in the rectum measuring 8.5 cm. Stool  throughout the colon. Very little intra-abdominal or subcutaneous fat. SKELETON: No focal hypermetabolic activity to suggest skeletal metastasis. Incidental CT findings: none IMPRESSION: 1. Large hypermetabolic mass in the LEFT lower lobe consistent primary bronchogenic carcinoma. No evidence of chest wall invasion. 2. No evidence of hypermetabolic mediastinalor hilar metastatic adenopathy. 3. Mild metabolic activity associated with a new RIGHT lower lobe pulmonary nodule. Findings concerning for metachronous bronchogenic carcinoma. 4. Large volume of stool in the rectum and colon. Patient may benefit from disimpaction. 5. Very little body fat suggests cachexia. Electronically Signed   By: Suzy Bouchard M.D.   On: 07/23/2019 14:14   DG Chest Port 1 View  Result Date: 07/12/2019 CLINICAL DATA:  Dyspnea EXAM: PORTABLE CHEST 1 VIEW COMPARISON:  Two days ago FINDINGS: Retrocardiac opacity with volume loss, masslike appearance by CT and reportedly squamous cell carcinoma. Emphysema with hyperinflation of the right lung where there is interstitial coarsening. No visible residual pneumomediastinum. No pneumothorax is noted. IMPRESSION: 1. No progression from 2 days ago. Pneumo mediastinum is no longer clearly seen. 2. Left lower lobe mass and  volume loss. 3. Emphysema. Electronically Signed   By: Monte Fantasia M.D.   On: 07/12/2019 09:06   DG C-Arm 1-60 Min-No Report  Result Date: 06/30/2019 Fluoroscopy was utilized by the requesting physician.  No radiographic interpretation.    ASSESSMENT: Stage IIIa squamous cell carcinoma of left lower lobe lung, hypercalcemia of malignancy.  PD-L1 20%.  PLAN:    1.  Squamous cell carcinoma of left lower lobe lung: PET scan results from July 23, 2019 reviewed independently and report as above confirming stage of disease.  Given patient's continual decline of performance status is unlikely he would be able to tolerate treatments and has agreed to hospice care.   Referral has been made to radiation oncology for palliative XRT, but is unlikely patient will be able to make this appointment.  If patient returns for his radiation oncology appointment, will have medical oncology evaluation at that time as well.  Appreciate palliative care input.   2.  Hypercalcemia: Likely secondary to malignancy.  Resolved.  Patient received 4 mg IV Zometa on June 18, 2019. 3.  Anemia: Hemoglobin continues to trend down is now 7.6.  Hospice as above. 4.  Leukocytosis: Chronic and unchanged.  Likely reactive, monitor. 5.  Thrombocytosis: Chronic and unchanged.  Likely reactive, monitor. 6.  Poor appetite/weight loss: Continue dexamethasone daily.  Appreciate palliative care input.  Hospice as above.  Patient expressed understanding and was in agreement with this plan. He also understands that He can call clinic at any time with any questions, concerns, or complaints.   Cancer Staging Squamous cell carcinoma of left lung [facility]) Staging form: Lung, AJCC 8th Edition - Clinical stage from 07/25/2019: Stage IIIA (cT4, cN0, cM0) - Signed by Lloyd Huger, MD on 07/25/2019   Lloyd Huger, MD   07/27/2019 8:07 AM

## 2019-07-21 ENCOUNTER — Encounter: Payer: Self-pay | Admitting: *Deleted

## 2019-07-23 ENCOUNTER — Telehealth: Payer: Self-pay | Admitting: *Deleted

## 2019-07-23 ENCOUNTER — Other Ambulatory Visit: Payer: Self-pay

## 2019-07-23 ENCOUNTER — Encounter
Admission: RE | Admit: 2019-07-23 | Discharge: 2019-07-23 | Disposition: A | Discharge status: Home / Self Care | Payer: Medicare Other | Source: Ambulatory Visit | Attending: Oncology | Admitting: Oncology

## 2019-07-23 DIAGNOSIS — R918 Other nonspecific abnormal finding of lung field: Secondary | ICD-10-CM | POA: Diagnosis not present

## 2019-07-23 LAB — GLUCOSE, CAPILLARY
Glucose-Capillary: 66 mg/dL — ABNORMAL LOW (ref 70–99)
Glucose-Capillary: 73 mg/dL (ref 70–99)

## 2019-07-23 MED ORDER — FLUDEOXYGLUCOSE F - 18 (FDG) INJECTION
5.6000 | Freq: Once | INTRAVENOUS | Status: AC | PRN
  Administered 2019-07-23: 5.6 via INTRAVENOUS

## 2019-07-23 NOTE — Telephone Encounter (Signed)
Called report IMPRESSION: 1. Large hypermetabolic mass in the LEFT lower lobe consistent primary bronchogenic carcinoma. No evidence of chest wall invasion. 2. No evidence of hypermetabolic mediastinalor hilar metastatic adenopathy. 3. Mild metabolic activity associated with a new RIGHT lower lobe pulmonary nodule. Findings concerning for metachronous bronchogenic carcinoma. 4. Large volume of stool in the rectum and colon. Patient may benefit from disimpaction. 5. Very little body fat suggests cachexia.   Electronically Signed   By: Suzy Bouchard M.D.   On: 07/23/2019 14:14

## 2019-07-24 ENCOUNTER — Inpatient Hospital Stay: Payer: Medicare Other | Attending: Oncology

## 2019-07-24 DIAGNOSIS — Z79899 Other long term (current) drug therapy: Secondary | ICD-10-CM | POA: Insufficient documentation

## 2019-07-24 DIAGNOSIS — C3490 Malignant neoplasm of unspecified part of unspecified bronchus or lung: Secondary | ICD-10-CM | POA: Insufficient documentation

## 2019-07-24 DIAGNOSIS — Z66 Do not resuscitate: Secondary | ICD-10-CM | POA: Insufficient documentation

## 2019-07-24 DIAGNOSIS — J982 Interstitial emphysema: Secondary | ICD-10-CM | POA: Insufficient documentation

## 2019-07-24 DIAGNOSIS — Z87891 Personal history of nicotine dependence: Secondary | ICD-10-CM | POA: Insufficient documentation

## 2019-07-24 DIAGNOSIS — I2510 Atherosclerotic heart disease of native coronary artery without angina pectoris: Secondary | ICD-10-CM | POA: Insufficient documentation

## 2019-07-24 DIAGNOSIS — I10 Essential (primary) hypertension: Secondary | ICD-10-CM | POA: Insufficient documentation

## 2019-07-24 DIAGNOSIS — I4892 Unspecified atrial flutter: Secondary | ICD-10-CM | POA: Insufficient documentation

## 2019-07-24 DIAGNOSIS — J449 Chronic obstructive pulmonary disease, unspecified: Secondary | ICD-10-CM | POA: Insufficient documentation

## 2019-07-24 NOTE — Progress Notes (Addendum)
Tumor Board Documentation  Corey Carr was presented by Dr Grayland Ormond at our Tumor Board on 07/24/2019, which included representatives from radiation oncology, medical oncology, surgical oncology, surgical, radiology, genetics, pathology, navigation, pharmacy, internal medicine, research, palliative care.  Corey Carr currently presents as a current patient, for discussion, for [place], for new positive pathology with history of the following treatments: active survellience, surgical intervention(s).  Additionally, we reviewed previous medical and familial history, history of present illness, and recent lab results along with all available histopathologic and imaging studies. The tumor board considered available treatment options and made the following recommendations: XRT, given patient's frailty, uncertain about giving chemotherapy. Send pathology for PDL-1 testing  The following procedures/referrals were also placed: No orders of the defined types were placed in this encounter.   Clinical Trial Status: not discussed   Staging used: Pathologic Stage  AJCC Staging:       Group: Stage 3 Squamous Cell Lung cancer   National site-specific guidelines   were discussed with respect to the case.  Tumor board is a meeting of clinicians from various specialty areas who evaluate and discuss patients for whom a multidisciplinary approach is being considered. Final determinations in the plan of care are those of the provider(s). The responsibility for follow up of recommendations given during tumor board is that of the provider.   Today's extended care, comprehensive team conference, Jian was not present for the discussion and was not examined.   Multidisciplinary Tumor Board is a multidisciplinary case peer review process.  Decisions discussed in the Multidisciplinary Tumor Board reflect the opinions of the specialists present at the conference without having examined the patient.  Ultimately,  treatment and diagnostic decisions rest with the primary provider(s) and the patient.

## 2019-07-25 ENCOUNTER — Encounter: Payer: Self-pay | Admitting: *Deleted

## 2019-07-25 ENCOUNTER — Other Ambulatory Visit: Payer: Self-pay | Admitting: *Deleted

## 2019-07-25 ENCOUNTER — Inpatient Hospital Stay (HOSPITAL_BASED_OUTPATIENT_CLINIC_OR_DEPARTMENT_OTHER): Payer: Medicare Other | Admitting: Hospice and Palliative Medicine

## 2019-07-25 ENCOUNTER — Encounter: Payer: Self-pay | Admitting: Oncology

## 2019-07-25 ENCOUNTER — Other Ambulatory Visit: Payer: Self-pay

## 2019-07-25 ENCOUNTER — Inpatient Hospital Stay (HOSPITAL_BASED_OUTPATIENT_CLINIC_OR_DEPARTMENT_OTHER): Payer: Medicare Other | Admitting: Oncology

## 2019-07-25 DIAGNOSIS — I10 Essential (primary) hypertension: Secondary | ICD-10-CM | POA: Diagnosis not present

## 2019-07-25 DIAGNOSIS — Z515 Encounter for palliative care: Secondary | ICD-10-CM | POA: Diagnosis not present

## 2019-07-25 DIAGNOSIS — J982 Interstitial emphysema: Secondary | ICD-10-CM | POA: Diagnosis not present

## 2019-07-25 DIAGNOSIS — I4892 Unspecified atrial flutter: Secondary | ICD-10-CM | POA: Diagnosis not present

## 2019-07-25 DIAGNOSIS — I2510 Atherosclerotic heart disease of native coronary artery without angina pectoris: Secondary | ICD-10-CM | POA: Diagnosis not present

## 2019-07-25 DIAGNOSIS — Z87891 Personal history of nicotine dependence: Secondary | ICD-10-CM | POA: Diagnosis not present

## 2019-07-25 DIAGNOSIS — C3490 Malignant neoplasm of unspecified part of unspecified bronchus or lung: Secondary | ICD-10-CM | POA: Diagnosis present

## 2019-07-25 DIAGNOSIS — C3492 Malignant neoplasm of unspecified part of left bronchus or lung: Secondary | ICD-10-CM

## 2019-07-25 DIAGNOSIS — J449 Chronic obstructive pulmonary disease, unspecified: Secondary | ICD-10-CM | POA: Diagnosis not present

## 2019-07-25 DIAGNOSIS — R918 Other nonspecific abnormal finding of lung field: Secondary | ICD-10-CM | POA: Diagnosis not present

## 2019-07-25 DIAGNOSIS — Z66 Do not resuscitate: Secondary | ICD-10-CM | POA: Diagnosis not present

## 2019-07-25 DIAGNOSIS — Z79899 Other long term (current) drug therapy: Secondary | ICD-10-CM | POA: Diagnosis not present

## 2019-07-25 LAB — GLUCOSE, CAPILLARY
Glucose-Capillary: 38 mg/dL — CL (ref 70–99)
Glucose-Capillary: 39 mg/dL — CL (ref 70–99)

## 2019-07-25 MED ORDER — HYDROCODONE-ACETAMINOPHEN 5-325 MG PO TABS: 1.0000 | ORAL_TABLET | Freq: Four times a day (QID) | ORAL | 0 refills | Status: DC | PRN

## 2019-07-25 MED ORDER — HYDROCODONE-ACETAMINOPHEN 5-325 MG PO TABS: 1.0000 | ORAL_TABLET | Freq: Four times a day (QID) | ORAL | 0 refills | Status: AC | PRN

## 2019-07-25 NOTE — Progress Notes (Signed)
[place]  [phone] [phone]   Name: Corey Carr Date: 07/25/2019 MRN: [id number]  DOB: [birth year]  Patient Care Team: [name], MD as PCP - General (Family Medicine) Telford Nab, RN as Oncology Nurse Navigator    REASON FOR CONSULTATION: Corey Carr is a 79 y.o. male with multiple medical problems including stage III-IV squamous cell lung cancer and O2 dependent COPD (on 3 L, which he has required since hospitalization in February 2021), and afib.  Patient has multiple recent hospitalizations including 05/08/2020-05/12/2019 with COVID-19 pneumonia and failure to thrive with 41 pound weight loss over the past 6 months.  Patient was hospitalized again 06/18/2019-06/20/2019 with hypercalcemia and was found to have a lung mass with pneumomediastinum on CT scan.  Patient was seen by medical oncology and scheduled for outpatient work-up.  He was hospitalized again 06/26/2019-06/30/2019 with SIRS from postobstructive pneumonia.  Patient underwent left upper lobe biopsy on 06/30/2019 with pathology confirming squamous cell carcinoma.  Patient was hospitalized again 07/09/2019-07/15/2019 with shortness of breath, weakness and failure to thrive.  Palliative care was consulted help address goals and manage ongoing symptoms.   SOCIAL HISTORY:     reports that he quit smoking about 6 years ago. His smoking use included cigarettes. He has a 30.00 pack-year smoking history. He has never used smokeless tobacco. He reports that he does not drink alcohol or use drugs.   Patient is married and lives at home with his wife.  He has a son who lives nearby.  Patient retired a year ago from [employer].  ADVANCE DIRECTIVES:  Not on file  CODE STATUS: DNR/DNI (MOST Form completed on 07/25/19)  PAST MEDICAL HISTORY: Past Medical History:  Diagnosis Date  . Atrial flutter ([place])    CHADsVASc 3  . Hypertension     PAST  SURGICAL HISTORY:  Past Surgical History:  Procedure Laterality Date  . KNEE SURGERY Right 1957  . VIDEO BRONCHOSCOPY WITH ENDOBRONCHIAL NAVIGATION N/A 06/30/2019   Procedure: VIDEO BRONCHOSCOPY WITH ENDOBRONCHIAL NAVIGATION;  Surgeon: Ottie Glazier, MD;  Location: [facility] ORS;  Service: Thoracic;  Laterality: N/A;  . VIDEO BRONCHOSCOPY WITH ENDOBRONCHIAL ULTRASOUND N/A 06/30/2019   Procedure: VIDEO BRONCHOSCOPY WITH ENDOBRONCHIAL ULTRASOUND;  Surgeon: Ottie Glazier, MD;  Location: [facility] ORS;  Service: Thoracic;  Laterality: N/A;    HEMATOLOGY/ONCOLOGY HISTORY:  Oncology History   No history exists.    ALLERGIES:  has No Known Allergies.  MEDICATIONS:  Current Outpatient Medications  Medication Sig Dispense Refill  . albuterol (VENTOLIN HFA) 108 (90 Base) MCG/ACT inhaler Inhale 2 puffs into the lungs See admin instructions. Inhale 2 puffs into the lungs every 3 hours as needed for wheezing.    Marland Kitchen amiodarone (PACERONE) 200 MG tablet Take 1 tablet (200 mg total) by mouth 2 (two) times daily. 60 tablet 0  . aspirin 325 MG tablet Take 1 tablet by mouth daily.    . [facility] GLYCERIN ADULT 2 g suppository SMARTSIG:1 SUPPOS Rectally As Needed    . dexamethasone (DECADRON) 4 MG tablet Take 1 tablet (4 mg total) by mouth daily. 30 tablet 1  . esomeprazole (NEXIUM) 20 MG capsule Take 20 mg by mouth daily at 12 noon.    . feeding supplement, ENSURE ENLIVE, (ENSURE ENLIVE) LIQD Take 237 mLs by mouth 3 (three) times daily between meals. 21330 mL 0  . fluticasone (FLONASE) 50 MCG/ACT nasal spray Place 2 sprays into both nostrils daily as needed for allergies or  rhinitis.    . Fluticasone-Umeclidin-Vilant (TRELEGY ELLIPTA) 100-62.5-25 MCG/INH AEPB Inhale 1 puff into the lungs daily.    Marland Kitchen guaiFENesin-codeine 100-10 MG/5ML syrup Take 5 mLs by mouth every 6 (six) hours as needed for cough. 180 mL 0  . HYDROcodone-acetaminophen (NORCO/VICODIN) 5-325 MG tablet Take 1 tablet by mouth every 6 (six) hours as needed. 30  tablet 0  . loratadine (ALLERGY RELIEF) 10 MG tablet Take 1 tablet (10 mg total) by mouth daily. 30 tablet 1  . losartan-hydrochlorothiazide (HYZAAR) 100-25 MG tablet Take 1 tablet by mouth daily.    . megestrol (MEGACE) 400 MG/10ML suspension Take 10 mLs (400 mg total) by mouth daily. 480 mL 0  . meloxicam (MOBIC) 15 MG tablet Take 1 tablet by mouth daily as needed.    Marland Kitchen omega-3 acid ethyl esters (LOVAZA) 1 g capsule Take 1 g by mouth daily.    . polyethylene glycol powder (GLYCOLAX/MIRALAX) 17 GM/SCOOP powder Take 17 g by mouth 2 (two) times daily. Mix in 4-8 ounces of fluid prior to taking.    . vitamin B-12 (CYANOCOBALAMIN) 500 MCG tablet Take 500 mcg by mouth daily.    . vitamin C (ASCORBIC ACID) 250 MG tablet Take 500 mg by mouth daily.     . vitamin E (VITAMIN E) 400 UNIT capsule Take 400 Units by mouth daily.     No current facility-administered medications for this visit.    VITAL SIGNS: There were no vitals taken for this visit. There were no vitals filed for this visit.  Estimated body mass index is 14.41 kg/m as calculated from the following:   Height as of 07/08/19: '5\' 8"'  (1.727 m).   Weight as of 07/08/19: 94 lb 12.8 oz (43 kg).  LABS: CBC:    Component Value Date/Time   WBC 20.0 (H) 07/12/2019 0636   HGB 7.6 (L) 07/12/2019 0636   HCT 22.8 (L) 07/12/2019 0636   PLT 439 (H) 07/12/2019 0636   MCV 82.6 07/12/2019 0636   NEUTROABS 15.5 (H) 07/08/2019 1811   LYMPHSABS 0.5 (L) 07/08/2019 1811   MONOABS 0.6 07/08/2019 1811   EOSABS 0.0 07/08/2019 1811   BASOSABS 0.0 07/08/2019 1811   Comprehensive Metabolic Panel:    Component Value Date/Time   NA 137 07/12/2019 0636   K 4.0 07/12/2019 0636   K 3.9 11/26/2012 1048   CL 109 07/12/2019 0636   CO2 22 07/12/2019 0636   BUN 16 07/12/2019 0636   CREATININE 0.70 07/12/2019 0636   GLUCOSE 77 07/12/2019 0636   CALCIUM 8.1 (L) 07/12/2019 0636   CALCIUM 12.3 (H) 05/24/2019 0745   AST 15 07/09/2019 0629   ALT 27 07/09/2019  0629   ALKPHOS 48 07/09/2019 0629   BILITOT 1.0 07/09/2019 0629   PROT 5.4 (L) 07/09/2019 0629   ALBUMIN 2.6 (L) 07/09/2019 0629    RADIOGRAPHIC STUDIES: DG Chest 1 View  Result Date: 07/10/2019 CLINICAL DATA:  Pneumomediastinum. Additional history provided: History of hypertension. Former smoker. EXAM: CHEST  1 VIEW COMPARISON:  CT angiogram chest 07/08/2019, chest radiograph 06/25/2019 FINDINGS: The cardiomediastinal silhouette is unchanged, although largely obscured. Persistent curvilinear opacity projecting along the lateral aspect of the descending thoracic aorta consistent with previously demonstrated pneumomediastinum. Unchanged masslike consolidation within the left lower lung with chronic elevation of the left hemidiaphragm. A left pleural effusion is difficult to exclude. Redemonstrated background emphysematous change with biapical pleuroparenchymal scarring. Chronically increased interstitial markings within the aerated portions of the left lung. A right lower lobe pulmonary nodule  was better appreciated on recent prior CT. No evidence of pneumothorax. No acute bony abnormality. IMPRESSION: 1. Similar appearance of pneumomediastinum as compared to chest radiograph 06/25/2019. However, progression of pneumomediastinum was noted on prior CT 07/08/2019. 2. Unchanged masslike consolidation within the left lower lung with chronic elevation of the left hemidiaphragm. 3. Redemonstrated background bullous emphysema with biapical pleuroparenchymal scarring. 4. A right lower lobe pulmonary nodule was better appreciated on recent prior CT. Electronically Signed   By: Kellie Simmering DO   On: 07/10/2019 11:21   CT Head Wo Contrast  Result Date: 07/08/2019 CLINICAL DATA:  Lung cancer. Assessment for hemorrhage in the setting of possible need for anticoagulation. EXAM: CT HEAD WITHOUT CONTRAST TECHNIQUE: Contiguous axial images were obtained from the base of the skull through the vertex without intravenous  contrast. COMPARISON:  None. FINDINGS: Brain: There is no mass, hemorrhage or extra-axial collection. There is generalized atrophy without lobar predilection. Hypodensity of the white matter is most commonly associated with chronic microvascular disease. Vascular: No abnormal hyperdensity of the major intracranial arteries or dural venous sinuses. No intracranial atherosclerosis. Skull: The visualized skull base, calvarium and extracranial soft tissues are normal. Sinuses/Orbits: No fluid levels or advanced mucosal thickening of the visualized paranasal sinuses. No mastoid or middle ear effusion. The orbits are normal. IMPRESSION: 1. No hemorrhage or other acute abnormality. 2. Chronic ischemic microangiopathy and generalized volume loss. Electronically Signed   By: Ulyses Jarred M.D.   On: 07/08/2019 19:37   CT Angio Chest PE W and/or Wo Contrast  Result Date: 07/08/2019 CLINICAL DATA:  79 year old male with shortness of breath, chronic oxygen. Currently being evaluated for possible left lower lobe mass. EXAM: CT ANGIOGRAPHY CHEST WITH CONTRAST TECHNIQUE: Multidetector CT imaging of the chest was performed using the standard protocol during bolus administration of intravenous contrast. Multiplanar CT image reconstructions and MIPs were obtained to evaluate the vascular anatomy. CONTRAST:  81m OMNIPAQUE IOHEXOL 350 MG/ML SOLN COMPARISON:  CTA chest and CT Abdomen and Pelvis 06/17/2019. FINDINGS: Cardiovascular: Good contrast bolus timing in the pulmonary arterial tree. Pulmonary artery patency in the left lung appears stable since last month, with obliterated left lower lobe pulmonary arteries. No central or hilar pulmonary embolus. The right lung pulmonary arteries appear patent and within normal limits. No aortic contrast today. Calcified aortic atherosclerosis. The heart remain shifted over to the left. No cardiomegaly or pericardial effusion is evident. Mediastinum/Nodes: Increased pneumomediastinum since  06/17/2019, gas now tracking into the bilateral lower neck and also toward the core of the diaphragm. The left hilum is indistinct as before, but elsewhere no mediastinal or right hilar lymphadenopathy is identified. Lungs/Pleura: The left lower lobe bronchus more abruptly terminates now on series 7, image 44 and there is no residual more distal left lower lobe airway pneumatization as there was last month. Complete masslike opacification of the left lower lobe otherwise appears stable. Superimposed bullous emphysema, left upper lobe and right lung base predominant scarring. An indeterminate superior segment right lower lobe 8-9 mm oval nodule is stable from last month, new since 03/05/2020. No pleural effusion or new right lung opacity. Upper Abdomen: Elevation of the left hemidiaphragm again suspected. The stomach and splenic flexure are visible on series 5, image 75. There is stool mixed with oral contrast or other dense material in the colon. Evidence of chronic calcific pancreatitis. Grossly negative visible noncontrast liver, kidneys and adrenal glands. Musculoskeletal: Osteopenia. No acute or suspicious osseous lesion identified. Review of the MIP images confirms the above  findings. IMPRESSION: 1. Stable pulmonary arteries, obliterated in the left lower lung but with no evidence of acute PE. 2. Continued mass-like opacification of the left lower lung with worsening abrupt termination of the lower lobe bronchus again highly suspicious for tumor. There is superimposed chronic elevation of the left hemidiaphragm. There is increased pneumomediastinum, now with subcutaneous gas tracking into the lower neck. It appears a PET-CT was scheduled for 06/25/2019 but did not occur. 3. Underlying bullous Emphysema (ICD10-J43.9). Aortic Atherosclerosis (ICD10-I70.0). Electronically Signed   By: Genevie Ann M.D.   On: 07/08/2019 19:36   NM PET Image Initial (PI) Skull Base To Thigh  Result Date: 07/23/2019 CLINICAL DATA:   Initial treatment strategy for lung carcinoma. LEFT lower lobe mass EXAM: NUCLEAR MEDICINE PET SKULL BASE TO THIGH TECHNIQUE: 5.6 mCi F-18 FDG was injected intravenously. Full-ring PET imaging was performed from the skull base to thigh after the radiotracer. CT data was obtained and used for attenuation correction and anatomic localization. Fasting blood glucose: 73 mg/dl COMPARISON:  3 CT 07/08/2019 FINDINGS: Mediastinal blood pool activity: SUV max 1.7 Liver activity: SUV max NA NECK: No cervical hypermetabolic lymph nodes. Incidental CT findings: none CHEST: Large intense hypermetabolic mass occupying large portion of the LEFT lower lobe and obstructing the LEFT lobe bronchus. Mass measures 7.6 x 5.5 cm with SUV max equal 25.8. No evidence chest wall invasion. No hypermetabolic mediastinal lymph nodes. There is volume loss in the LEFT hemithorax with some shift of the mediastinum leftward. The RIGHT lung is hyperinflated. Within the RIGHT lower lobe there is a 10 mm nodule (image 98/3) with SUV max equal 1.8. This nodule is new from CT 03/06/2019 and therefore concerning for malignancy Incidental CT findings: Centrilobular emphysema ABDOMEN/PELVIS: No abnormal hypermetabolic activity within the liver, pancreas, adrenal glands, or spleen. No hypermetabolic lymph nodes in the abdomen or pelvis. Incidental CT findings: Large stool ball in the rectum measuring 8.5 cm. Stool throughout the colon. Very little intra-abdominal or subcutaneous fat. SKELETON: No focal hypermetabolic activity to suggest skeletal metastasis. Incidental CT findings: none IMPRESSION: 1. Large hypermetabolic mass in the LEFT lower lobe consistent primary bronchogenic carcinoma. No evidence of chest wall invasion. 2. No evidence of hypermetabolic mediastinalor hilar metastatic adenopathy. 3. Mild metabolic activity associated with a new RIGHT lower lobe pulmonary nodule. Findings concerning for metachronous bronchogenic carcinoma. 4. Large  volume of stool in the rectum and colon. Patient may benefit from disimpaction. 5. Very little body fat suggests cachexia. Electronically Signed   By: Suzy Bouchard M.D.   On: 07/23/2019 14:14   DG Chest Port 1 View  Result Date: 07/12/2019 CLINICAL DATA:  Dyspnea EXAM: PORTABLE CHEST 1 VIEW COMPARISON:  Two days ago FINDINGS: Retrocardiac opacity with volume loss, masslike appearance by CT and reportedly squamous cell carcinoma. Emphysema with hyperinflation of the right lung where there is interstitial coarsening. No visible residual pneumomediastinum. No pneumothorax is noted. IMPRESSION: 1. No progression from 2 days ago. Pneumo mediastinum is no longer clearly seen. 2. Left lower lobe mass and volume loss. 3. Emphysema. Electronically Signed   By: Monte Fantasia M.D.   On: 07/12/2019 09:06   DG C-Arm 1-60 Min-No Report  Result Date: 06/30/2019 Fluoroscopy was utilized by the requesting physician.  No radiographic interpretation.   ECHOCARDIOGRAM COMPLETE  Result Date: 06/26/2019   ECHOCARDIOGRAM REPORT   Patient Name:   DENT PLANTZ Date of Exam: 06/26/2019 Medical Rec #:  [id number]       Height:  68.0 in Accession #:    [id number]      Weight:       92.2 lb Date of Birth:  Apr 02, 1941       BSA:          1.47 m Patient Age:    66 years        BP:           89/66 mmHg Patient Gender: M               HR:           129 bpm. Exam Location:  [facility] Procedure: 2D Echo Indications:     ATRIAL FIBRILLATION 427.31/I48.91  History:         Patient has no prior history of Echocardiogram examinations.                  COPD; Risk Factors:Hypertension.  Sonographer:     Avanell Shackleton Referring Phys:  [place] Diagnosing Phys: Ida Rogue MD  Sonographer Comments: Image acquisition challenging due to patient body habitus. IMPRESSIONS  1. Left ventricular ejection fraction, by visual estimation, is 50 to 55%. The left ventricle has normal function. There is moderately increased left ventricular  hypertrophy.  2. Left ventricular diastolic parameters are indeterminate.  3. The left ventricle has no regional wall motion abnormalities.  4. Global right ventricle has normal systolic function.The right ventricular size is normal. No increase in right ventricular wall thickness.  5. Left atrial size was normal.  6. Tricuspid valve regurgitation is mild-moderate.  7. Mildly elevated pulmonary artery systolic pressure.  8. Tachycardia noted/atrial flutter, rate 130 bpm FINDINGS  Left Ventricle: Left ventricular ejection fraction, by visual estimation, is 50 to 55%. The left ventricle has normal function. The left ventricle has no regional wall motion abnormalities. There is moderately increased left ventricular hypertrophy. Left ventricular diastolic parameters are indeterminate. Normal left atrial pressure. Right Ventricle: The right ventricular size is normal. No increase in right ventricular wall thickness. Global RV systolic function is has normal systolic function. The tricuspid regurgitant velocity is 2.90 m/s, and with an assumed right atrial pressure  of 5 mmHg, the estimated right ventricular systolic pressure is mildly elevated at 38.6 mmHg. Left Atrium: Left atrial size was normal in size. Right Atrium: Right atrial size was normal in size Pericardium: There is no evidence of pericardial effusion. Mitral Valve: The mitral valve is normal in structure. Mild to moderate mitral valve regurgitation. No evidence of mitral valve stenosis by observation. Tricuspid Valve: The tricuspid valve is normal in structure. Tricuspid valve regurgitation is mild-moderate. Aortic Valve: The aortic valve is normal in structure. Aortic valve regurgitation is not visualized. Mild to moderate aortic valve sclerosis/calcification is present, without any evidence of aortic stenosis. Pulmonic Valve: The pulmonic valve was normal in structure. Pulmonic valve regurgitation is not visualized. Pulmonic regurgitation is not  visualized. Aorta: The aortic root, ascending aorta and aortic arch are all structurally normal, with no evidence of dilitation or obstruction. Venous: The inferior vena cava is normal in size with greater than 50% respiratory variability, suggesting right atrial pressure of 3 mmHg. IAS/Shunts: No atrial level shunt detected by color flow Doppler. There is no evidence of a patent foramen ovale. No ventricular septal defect is seen or detected. There is no evidence of an atrial septal defect.  LEFT VENTRICLE PLAX 2D LVIDd:         2.60 cm LVIDs:  2.26 cm LV PW:         0.68 cm LV IVS:        0.92 cm LVOT diam:     1.70 cm LV SV:         7 ml LV SV Index:   5.30 LVOT Area:     2.27 cm  LEFT ATRIUM             Index       RIGHT ATRIUM           Index LA diam:        2.70 cm 1.84 cm/m  RA Area:     16.00 cm LA Vol (A2C):   58.4 ml 39.72 ml/m RA Volume:   45.70 ml  31.08 ml/m LA Vol (A4C):   33.6 ml 22.85 ml/m LA Biplane Vol: 48.1 ml 32.71 ml/m   AORTA Ao Root diam: 3.30 cm TRICUSPID VALVE TR Peak grad:   33.6 mmHg TR Vmax:        305.00 cm/s  SHUNTS Systemic Diam: 1.70 cm  Ida Rogue MD Electronically signed by Ida Rogue MD Signature Date/Time: 06/26/2019/7:28:13 PM    Final    [place] EKG SITE RITE  Result Date: 06/26/2019 If Site Rite image not attached, placement could not be confirmed due to current cardiac rhythm.   PERFORMANCE STATUS (ECOG) : 4 - Bedbound  Review of Systems Unless otherwise noted, a complete review of systems is negative.  Physical Exam General: Ill-appearing, cachectic Pulmonary: Unlabored Extremities: no edema, no joint deformities Skin: no rashes Neurological: Weakness but otherwise nonfocal  IMPRESSION: Post hospitalization follow-up visit today in clinic.  Patient was accompanied by his wife.  Patient has done poorly since discharging from the hospital.  Wife says that each hospitalization has resulted in further decline.  Patient saw Dr. Grayland Ormond today in  the clinic.  He appears to have persistent FTT likely secondary to cancer. He is not felt to be a candidate for systemic chemotherapy.  A referral has been made to radiation oncology but wife says that it was all that she could do to get him to the clinic today and patient does not seem keen on the idea of frequent follow-up.    Patient deferred to his wife for decision-making.  Wife says that she anticipates that patient is nearing end-of-life.  Wife says that she just wants to focus on keeping him comfortable and would prefer to have hospice follow him at home.  Patient and wife verbalized that they are not interested in future hospitalization and again reiterated goal of comfort.  I called a verbal order in for hospice at home.  I completed a MOST form today. The patient and family outlined their wishes for the following treatment decisions:  Cardiopulmonary Resuscitation: Do Not Attempt Resuscitation (DNR/No CPR)  Medical Interventions: Comfort Measures: Keep clean, warm, and dry. Use medication by any route, positioning, wound care, and other measures to relieve pain and suffering. Use oxygen, suction and manual treatment of airway obstruction as needed for comfort. Do not transfer to the hospital unless comfort needs cannot be met in current location.  Antibiotics: Determine use of limitation of antibiotics when infection occurs  IV Fluids: IV fluids for a defined trial period  Feeding Tube: No feeding tube    PLAN: -Best supportive care -Hospice referral -DNR/DNI -MOST form completed -comfort measures only -Refill Norco #30 -RTC as needed  Case and plan discussed with Dr. Grayland Ormond   Patient expressed understanding  and was in agreement with this plan. He also understands that He can call the clinic at any time with any questions, concerns, or complaints.     Time Total: 30 minutes  Visit consisted of counseling and education dealing with the complex and emotionally intense  issues of symptom management and palliative care in the setting of serious and potentially life-threatening illness.Greater than 50%  of this time was spent counseling and coordinating care related to the above assessment and plan.  Signed by: Altha Harm, PhD, NP-C

## 2019-07-28 ENCOUNTER — Telehealth: Payer: Self-pay | Admitting: *Deleted

## 2019-07-28 NOTE — Progress Notes (Signed)
  Oncology Nurse Navigator Documentation  Navigator Location: [facility] (07/25/19 1600)   )Navigator Encounter Type: Follow-up Appt (07/25/19 1600)                     Patient Visit Type: MedOnc (07/25/19 1600) Treatment Phase: Follow-up (07/25/19 1600) Barriers/Navigation Needs: Coordination of Care (07/25/19 1600)   Interventions: Referrals (07/25/19 1600) Referrals: Palliative Care (07/25/19 1600)                    Time Spent with Patient: 60 (07/25/19 1600)

## 2019-07-28 NOTE — Telephone Encounter (Signed)
Pt's wife left message that pt passed away August 24, 2022 around 0500. All appts have been cancelled. MD made aware.

## 2019-07-30 ENCOUNTER — Encounter: Payer: Medicare Other | Admitting: Hospice and Palliative Medicine

## 2019-07-30 ENCOUNTER — Institutional Professional Consult (permissible substitution): Payer: Medicare Other | Admitting: Radiation Oncology

## 2019-07-30 ENCOUNTER — Ambulatory Visit: Payer: Medicare Other | Admitting: Oncology

## 2019-08-21 DEATH — deceased

## 2020-06-17 IMAGING — CT CT ANGIO CHEST
2 of 6 series · 18 of 46 positions shown · IV contrast (APPLIED)
Comparison: 03/06/2019

CLINICAL DATA: Shortness of breath

EXAM:
CT ANGIOGRAPHY CHEST WITH CONTRAST
TECHNIQUE: Multidetector CT imaging of the chest was performed using the
standard protocol during bolus administration of intravenous
contrast. Multiplanar CT image reconstructions and MIPs were
obtained to evaluate the vascular anatomy.
CONTRAST:  75mL OMNIPAQUE IOHEXOL 350 MG/ML SOLN

[Series 3: thins · axial · 0.69mm/px · z∈[+466,+746]mm · 15 of 308 slices shown]
[im 14/308  lung]
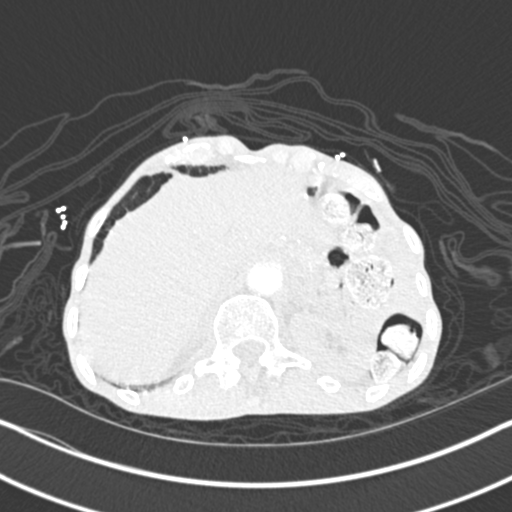
[im 41/308  soft-tissue]
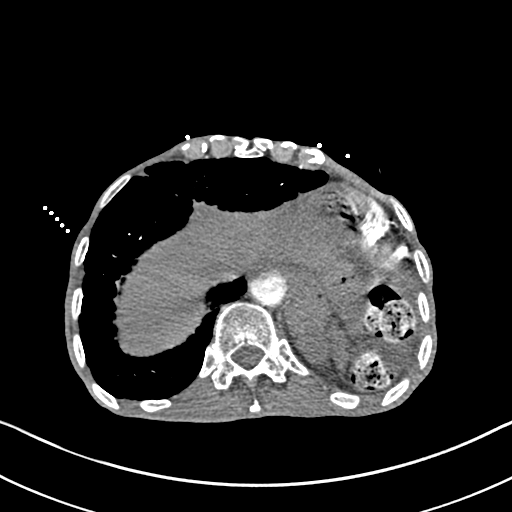
[im 54/308  lung]
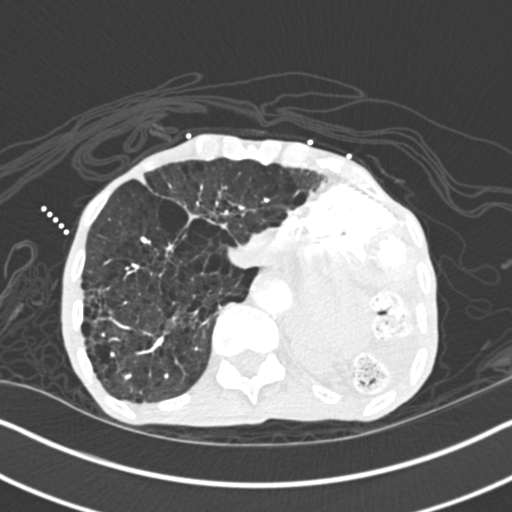
[im 81/308  soft-tissue]
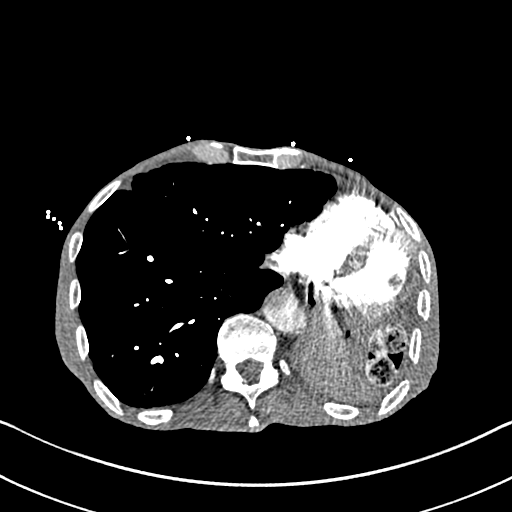
[im 94/308  lung]
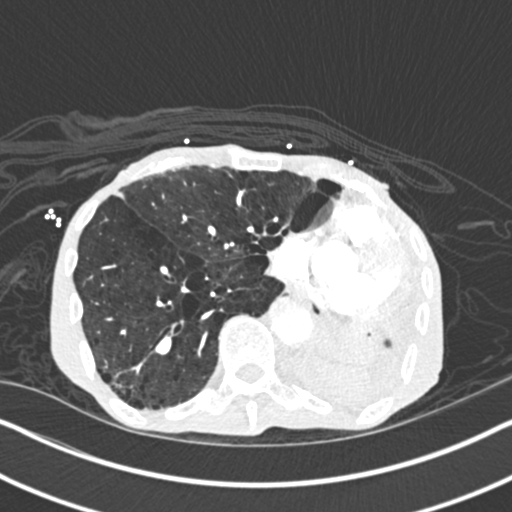
[im 121/308  soft-tissue]
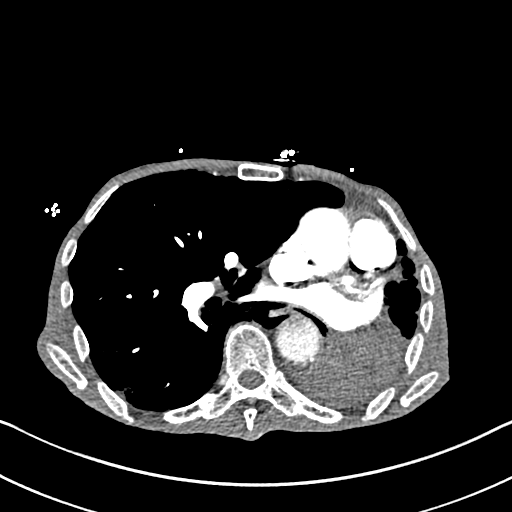
[im 134/308  lung]
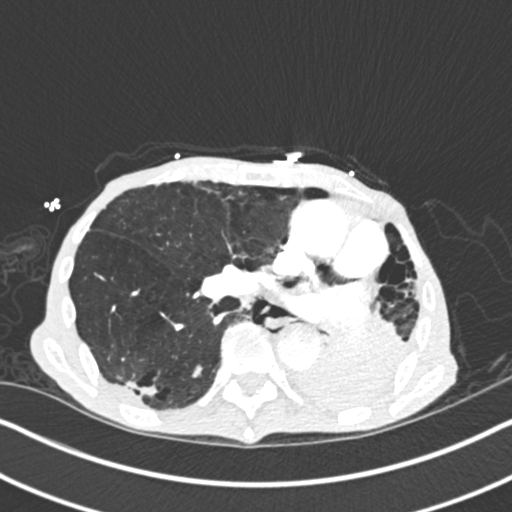
[im 161/308  soft-tissue]
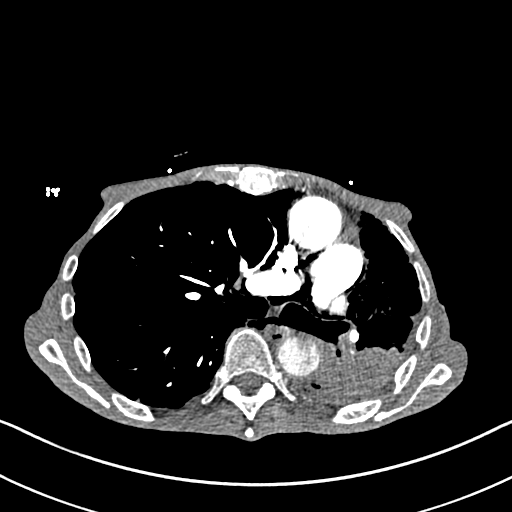
[im 174/308  lung]
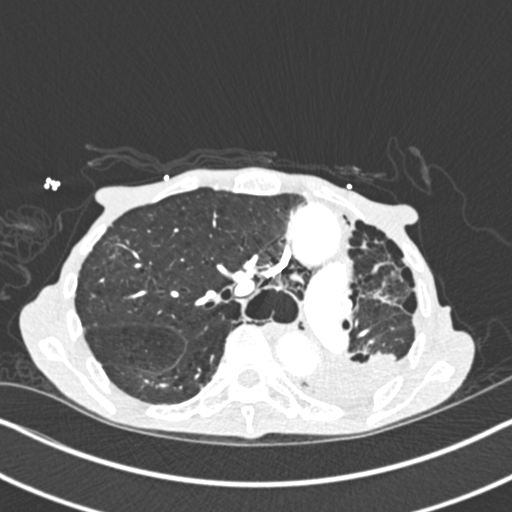
[im 187/308  soft-tissue]
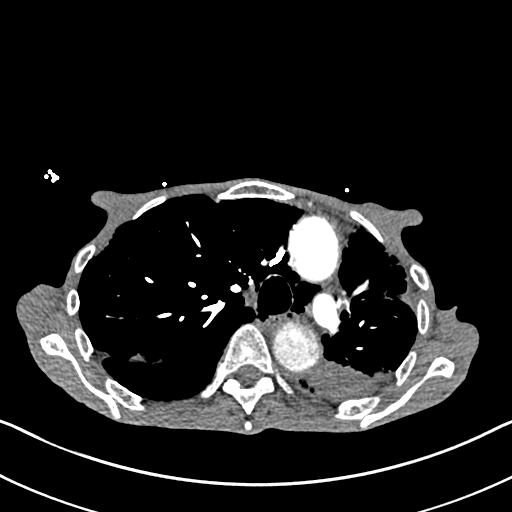
[im 214/308  lung]
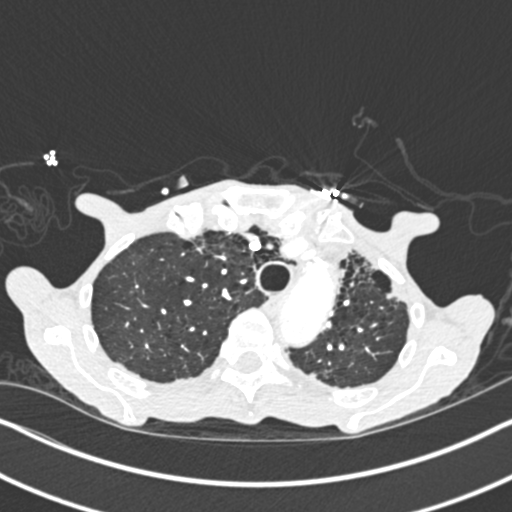
[im 227/308  soft-tissue]
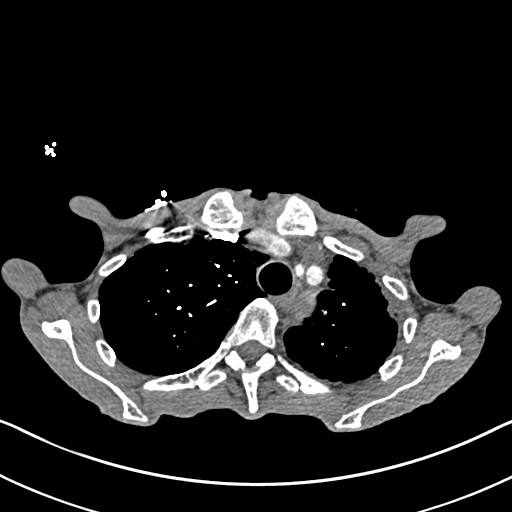
[im 254/308  lung]
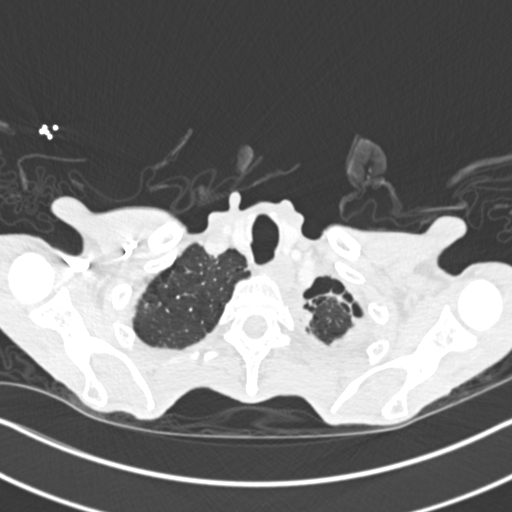
[im 267/308  soft-tissue]
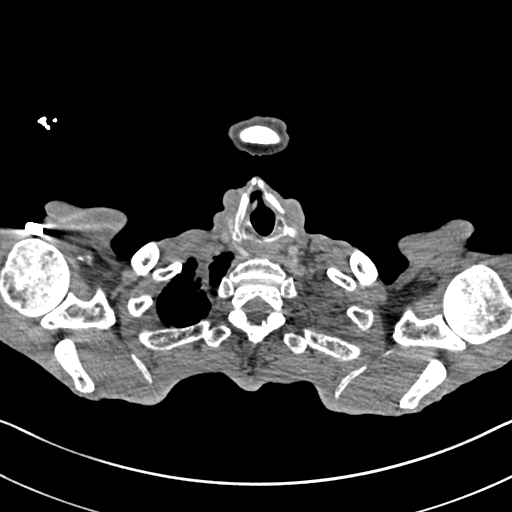
[im 294/308  lung]
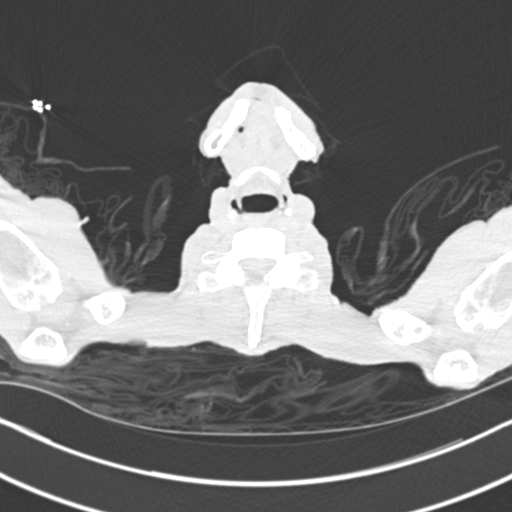

[Series 5: coronal mpr · coronal · 0.65mm/px · 3 of 109 slices shown]
[im 28/109  soft-tissue]
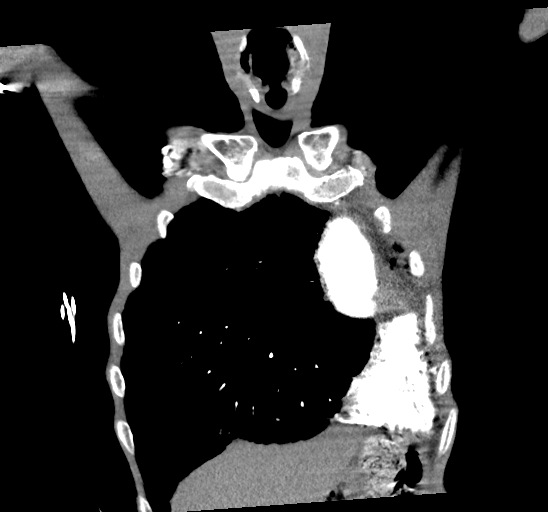
[im 55/109  soft-tissue]
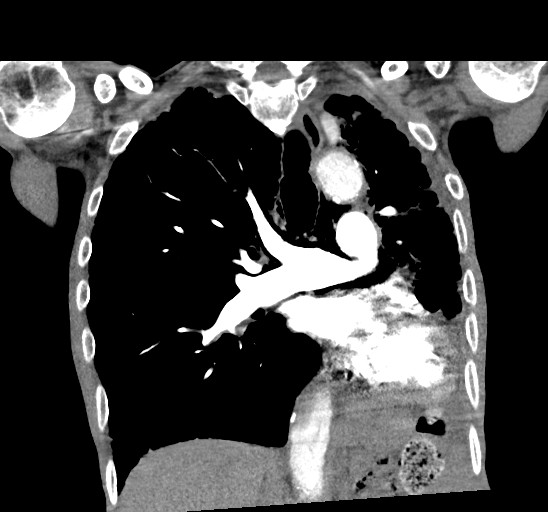
[im 82/109  soft-tissue]
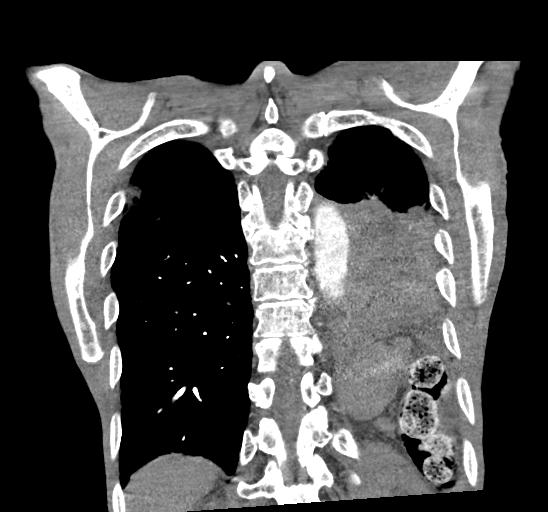

[18 of 46 positions shown; findings below may reference images not displayed]

FINDINGS: Cardiovascular: No filling defects in the pulmonary arteries to
suggest pulmonary emboli. Heart is normal size. Aorta normal
caliber. Aortic and coronary artery atherosclerosis.

Mediastinum/Nodes: There is pneumomediastinum. Gas seen around the
main pulmonary artery and in the posterior mediastinum. No
adenopathy.

Lungs/Pleura: Volume loss on the left with elevation of the left
hemidiaphragm. Findings are chronic. There is a chronic masslike
area of consolidation in the left lower lobe which is similar to
prior study. Advanced centrilobular emphysema and areas of scarring
in both lungs. Possible small left effusion, stable.

Upper Abdomen: Imaging into the upper abdomen shows no acute
findings.

Musculoskeletal: Cachexia.  No acute bony abnormality.

Review of the MIP images confirms the above findings.
IMPRESSION: Pneumomediastinum seen in the middle and posterior mediastinum. No
associated pneumothorax.

Advanced centrilobular emphysema.

Stable chronic volume loss on the left with elevation of the left
hemidiaphragm and masslike consolidation in the left lower lobe. As
recommended on prior study, bronchoscopy with attention to the left
lower lobe and/or PET CT may be helpful to exclude left lower lobe
mass lesion.

Small right pleural effusion.

Areas of scarring in the lungs bilaterally.

Aortic Atherosclerosis (84HJW-8C7.7) and Emphysema ([id number].F).

## 2020-06-17 IMAGING — CT CT ABD-PELV W/ CM
2 of 6 series · 15 of 46 positions shown, 17 images · IV contrast (APPLIED)
Comparison: None.

CLINICAL DATA: Hypercalcemia.

EXAM:
CT ABDOMEN AND PELVIS WITH CONTRAST
TECHNIQUE: Multidetector CT imaging of the abdomen and pelvis was performed
using the standard protocol following bolus administration of
intravenous contrast.
CONTRAST:  75mL OMNIPAQUE IOHEXOL 350 MG/ML SOLN

[Series 4: axial st · axial · 0.62mm/px · z∈[+183,+568]mm · 12 of 91 slices shown, 14 images]
[im 7/91  soft-tissue]
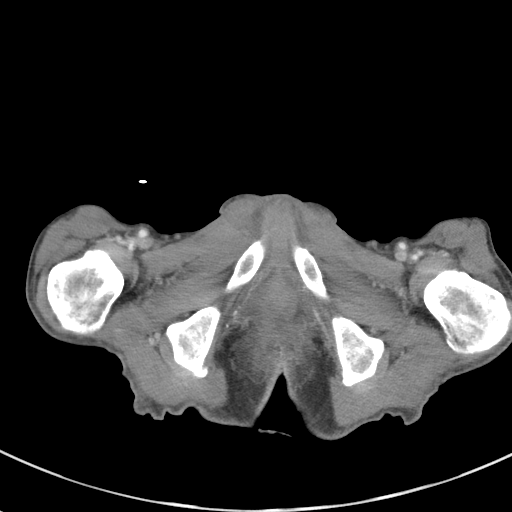
[im 7/91  bone]
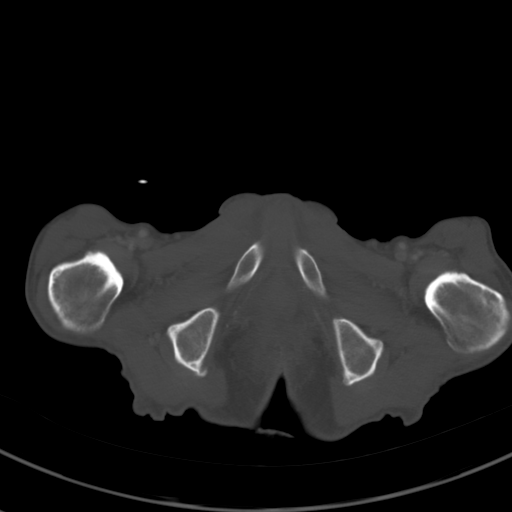
[im 13/91  soft-tissue]
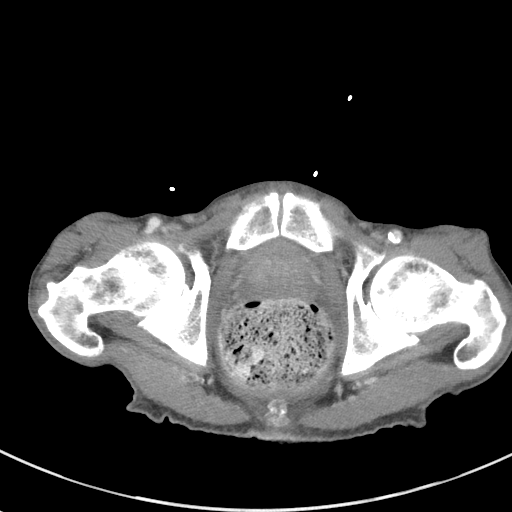
[im 20/91  soft-tissue]
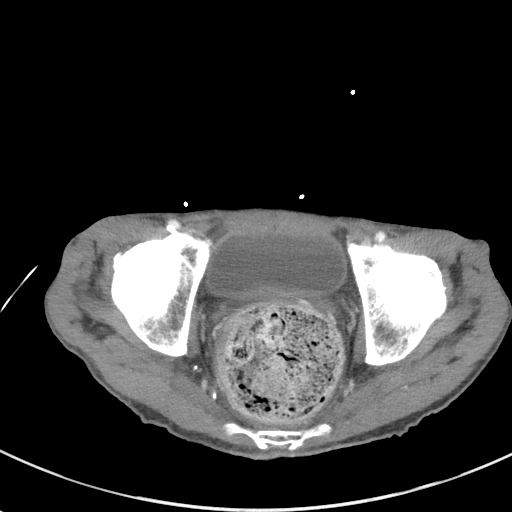
[im 26/91  soft-tissue]
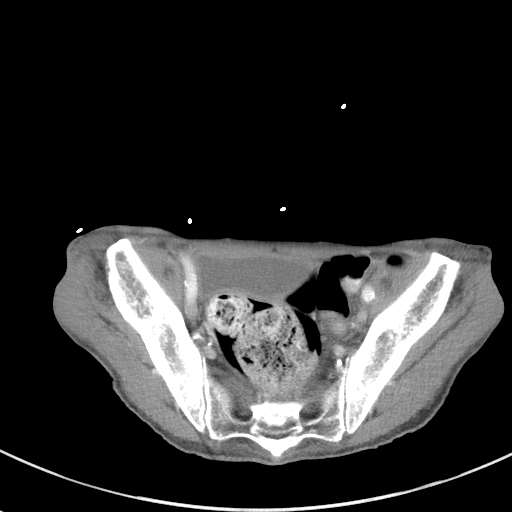
[im 33/91  soft-tissue]
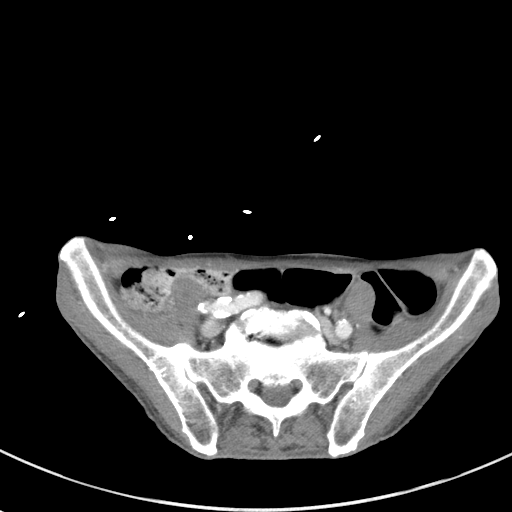
[im 39/91  soft-tissue]
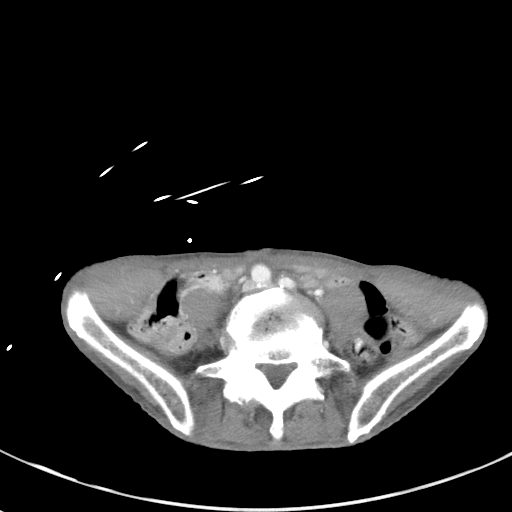
[im 52/91  soft-tissue]
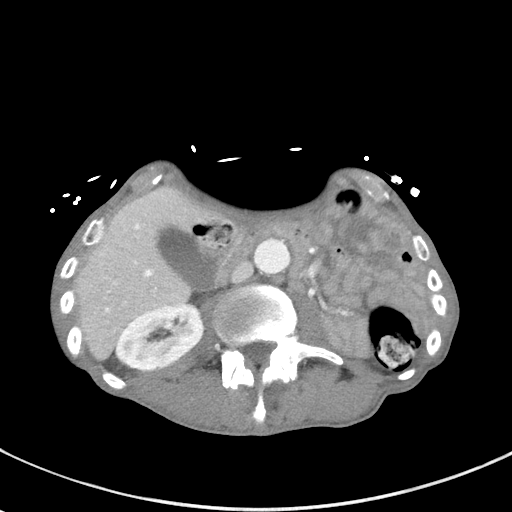
[im 58/91  soft-tissue]
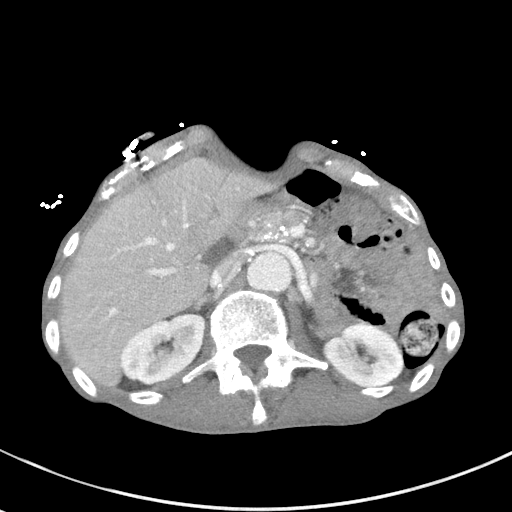
[im 65/91  soft-tissue]
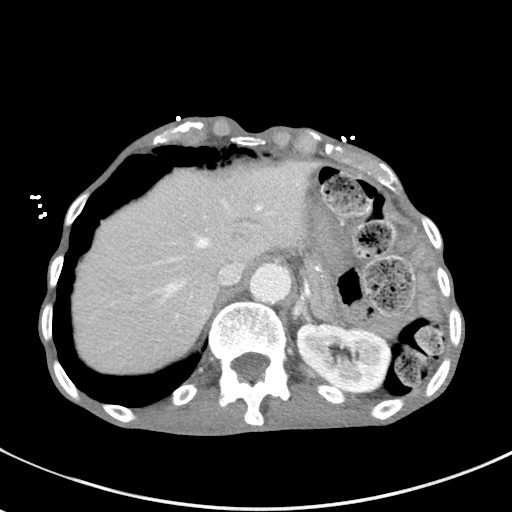
[im 65/91  bone]
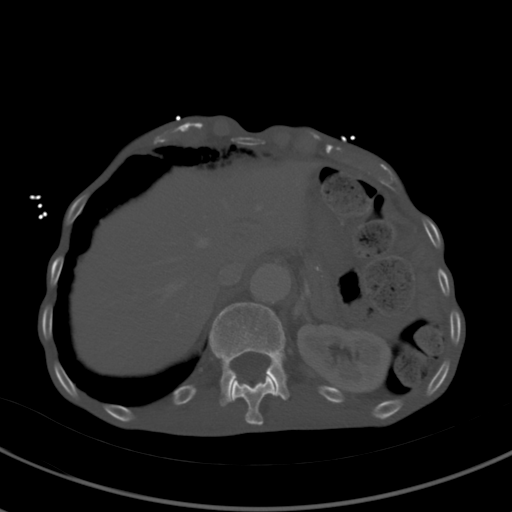
[im 71/91  soft-tissue]
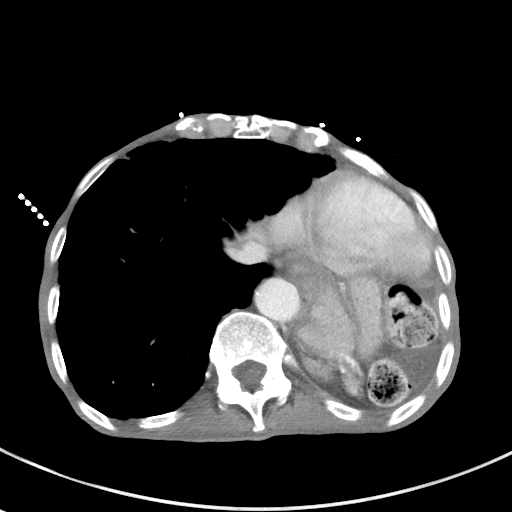
[im 78/91  soft-tissue]
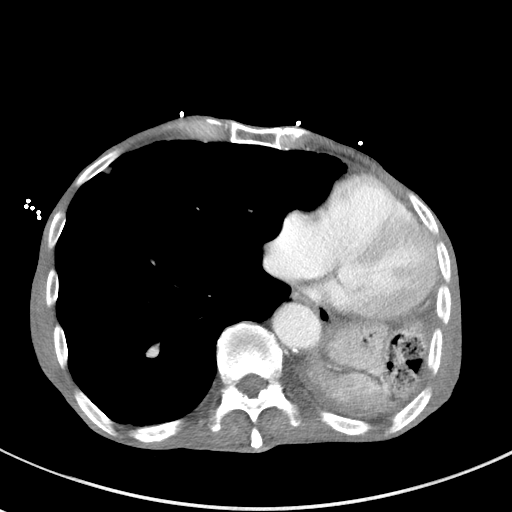
[im 84/91  soft-tissue]
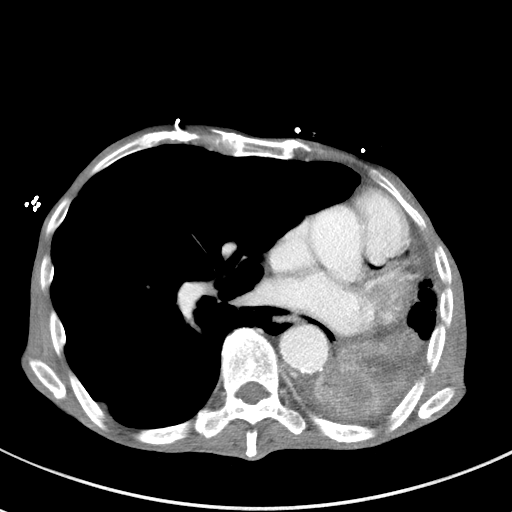

[Series 9: coronal st · coronal · 0.62mm/px · 3 of 74 slices shown]
[im 25/74  soft-tissue]
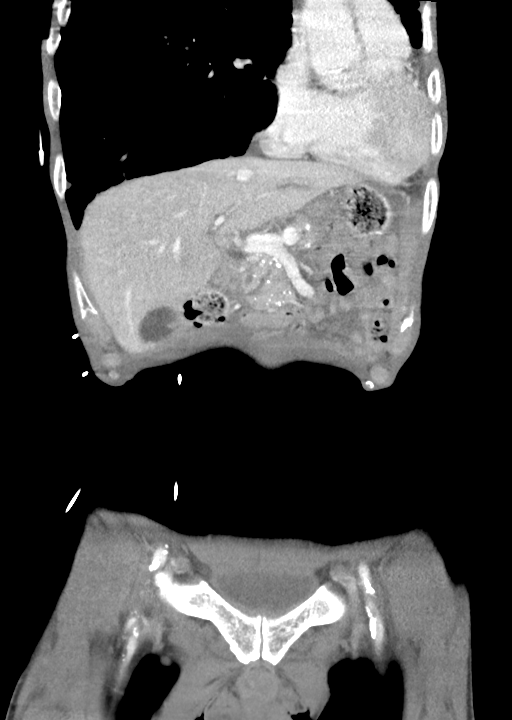
[im 33/74  soft-tissue]
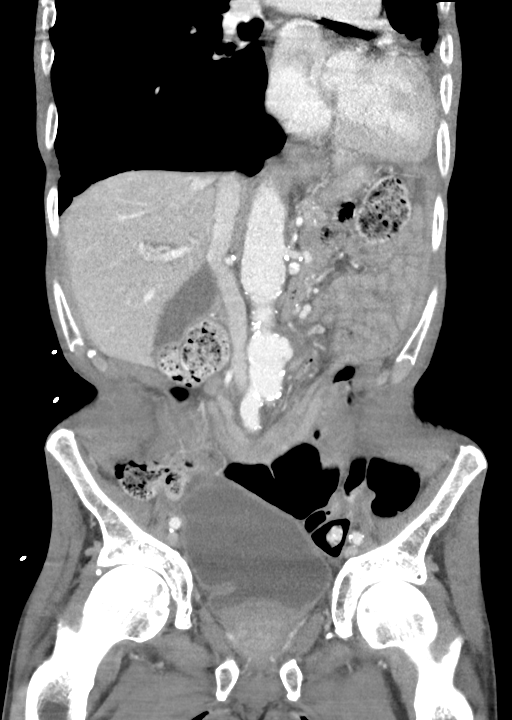
[im 41/74  soft-tissue]
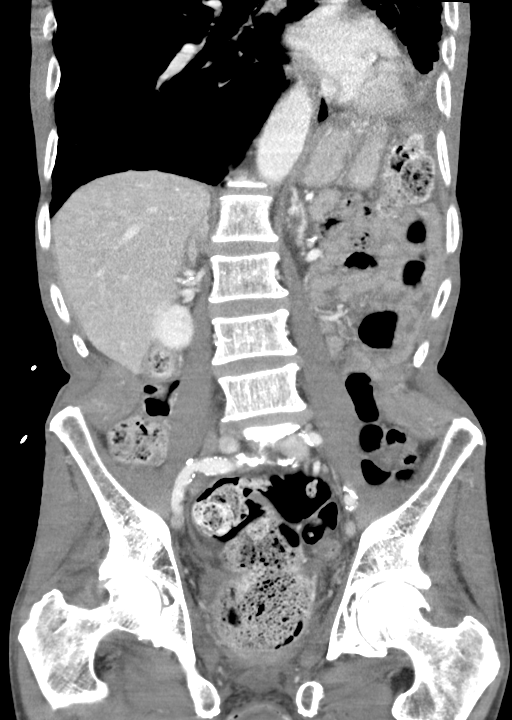

[15 of 46 positions shown; findings below may reference images not displayed]

FINDINGS: Lower chest: Masslike opacity seen in the left lower lobe as
described on prior chest CT. Trace left pleural effusion. Volume
loss on the left with elevation of the left hemidiaphragm. Advanced
emphysema. Scarring in the right lower lobe.

Hepatobiliary: No focal hepatic abnormality. Gallbladder
unremarkable.

Pancreas: Calcifications throughout the pancreas compatible with
chronic pancreatitis. No evidence of acute pancreatitis, fall
suspicious pancreatic lesion or pancreatic ductal dilatation.

Spleen: No focal abnormality.  Normal size.

Adrenals/Urinary Tract: Small cysts in the left kidney. No
hydronephrosis. Adrenal glands and urinary bladder unremarkable.

Stomach/Bowel: Large stool burden throughout the colon. Distention
of the rectum with stool concerning for fecal impaction. Stomach and
small bowel decompressed, unremarkable.

Vascular/Lymphatic: Aortic atherosclerosis. No enlarged abdominal or
pelvic lymph nodes.

Reproductive: Prostate prominence.

Other: No free fluid or free air.

Musculoskeletal: Cachexia. No acute bony abnormality. Degenerative
disc and facet disease in the lower lumbar spine.
IMPRESSION: Masslike opacity in the left lower lobe as described on chest CT
concerning for possible pulmonary mass lesions/malignancy. This
could be further evaluated with bronchoscopy and/or PET CT as
described on prior chest CT. Advanced emphysema.

Large stool burden throughout the colon. Appearance is concerning
for fecal impaction.

Chronic pancreatitis changes.  No evidence of acute pancreatitis.

Aortic atherosclerosis.
